# Patient Record
Sex: Male | Born: 1935 | Race: White | Hispanic: No | Marital: Married | Smoking: Former smoker
Health system: Southern US, Community
[De-identification: ages and names within clinical notes are randomized; demographics above are authoritative.]

## PROBLEM LIST (undated history)

## (undated) DIAGNOSIS — E785 Hyperlipidemia, unspecified: Secondary | ICD-10-CM

## (undated) DIAGNOSIS — I10 Essential (primary) hypertension: Secondary | ICD-10-CM

## (undated) DIAGNOSIS — Z87442 Personal history of urinary calculi: Secondary | ICD-10-CM

## (undated) DIAGNOSIS — I519 Heart disease, unspecified: Secondary | ICD-10-CM

## (undated) DIAGNOSIS — F329 Major depressive disorder, single episode, unspecified: Secondary | ICD-10-CM

## (undated) DIAGNOSIS — I2510 Atherosclerotic heart disease of native coronary artery without angina pectoris: Secondary | ICD-10-CM

## (undated) DIAGNOSIS — I714 Abdominal aortic aneurysm, without rupture, unspecified: Secondary | ICD-10-CM

## (undated) DIAGNOSIS — F32A Depression, unspecified: Secondary | ICD-10-CM

## (undated) DIAGNOSIS — I639 Cerebral infarction, unspecified: Secondary | ICD-10-CM

## (undated) DIAGNOSIS — I209 Angina pectoris, unspecified: Secondary | ICD-10-CM

## (undated) HISTORY — DX: Essential (primary) hypertension: I10

## (undated) HISTORY — PX: OTHER SURGICAL HISTORY: SHX169

## (undated) HISTORY — DX: Hyperlipidemia, unspecified: E78.5

## (undated) HISTORY — PX: EYE SURGERY: SHX253

## (undated) HISTORY — DX: Depression, unspecified: F32.A

## (undated) HISTORY — DX: Heart disease, unspecified: I51.9

## (undated) HISTORY — DX: Cerebral infarction, unspecified: I63.9

## (undated) HISTORY — PX: COLON SURGERY: SHX602

## (undated) HISTORY — DX: Major depressive disorder, single episode, unspecified: F32.9

## (undated) HISTORY — PX: COLONOSCOPY: SHX5424

## (undated) SURGERY — Surgical Case
Anesthesia: *Unknown

---

## 2001-02-13 ENCOUNTER — Encounter: Payer: Self-pay | Admitting: Family Medicine

## 2001-02-13 ENCOUNTER — Encounter: Admission: RE | Admit: 2001-02-13 | Discharge: 2001-02-13 | Payer: Self-pay | Admitting: Family Medicine

## 2001-02-27 ENCOUNTER — Ambulatory Visit (HOSPITAL_COMMUNITY): Admission: RE | Admit: 2001-02-27 | Discharge: 2001-02-27 | Payer: Self-pay | Admitting: Family Medicine

## 2001-03-01 ENCOUNTER — Encounter: Payer: Self-pay | Admitting: Vascular Surgery

## 2001-03-06 ENCOUNTER — Inpatient Hospital Stay (HOSPITAL_COMMUNITY): Admission: RE | Admit: 2001-03-06 | Discharge: 2001-03-07 | Payer: Self-pay | Admitting: Vascular Surgery

## 2001-09-18 ENCOUNTER — Ambulatory Visit (HOSPITAL_COMMUNITY): Admission: RE | Admit: 2001-09-18 | Discharge: 2001-09-18 | Payer: Self-pay | Admitting: Family Medicine

## 2004-12-03 ENCOUNTER — Ambulatory Visit (HOSPITAL_COMMUNITY): Admission: RE | Admit: 2004-12-03 | Discharge: 2004-12-03 | Payer: Self-pay | Admitting: Gastroenterology

## 2005-01-05 ENCOUNTER — Encounter: Admission: RE | Admit: 2005-01-05 | Discharge: 2005-01-05 | Payer: Self-pay | Admitting: General Surgery

## 2005-01-26 ENCOUNTER — Inpatient Hospital Stay (HOSPITAL_COMMUNITY): Admission: RE | Admit: 2005-01-26 | Discharge: 2005-01-30 | Payer: Self-pay | Admitting: General Surgery

## 2013-10-07 ENCOUNTER — Other Ambulatory Visit: Payer: Self-pay | Admitting: Family Medicine

## 2013-10-07 DIAGNOSIS — R109 Unspecified abdominal pain: Secondary | ICD-10-CM

## 2013-10-10 ENCOUNTER — Other Ambulatory Visit: Payer: Self-pay

## 2013-10-17 ENCOUNTER — Ambulatory Visit
Admission: RE | Admit: 2013-10-17 | Discharge: 2013-10-17 | Disposition: A | Discharge status: Home / Self Care | Payer: Medicare Other | Source: Ambulatory Visit | Attending: Family Medicine | Admitting: Family Medicine

## 2013-10-17 DIAGNOSIS — R109 Unspecified abdominal pain: Secondary | ICD-10-CM

## 2013-12-17 ENCOUNTER — Ambulatory Visit (INDEPENDENT_AMBULATORY_CARE_PROVIDER_SITE_OTHER): Payer: Medicare Other | Admitting: General Surgery

## 2013-12-17 ENCOUNTER — Encounter (INDEPENDENT_AMBULATORY_CARE_PROVIDER_SITE_OTHER): Payer: Self-pay | Admitting: General Surgery

## 2013-12-17 VITALS — BP 132/92 | HR 76 | Temp 97.2°F | Resp 18 | Ht 72.0 in | Wt 185.0 lb

## 2013-12-17 DIAGNOSIS — R229 Localized swelling, mass and lump, unspecified: Secondary | ICD-10-CM

## 2013-12-17 DIAGNOSIS — T8189XA Other complications of procedures, not elsewhere classified, initial encounter: Secondary | ICD-10-CM | POA: Insufficient documentation

## 2013-12-17 NOTE — Patient Instructions (Signed)
The tender nodule in the incision above your umbilicus is most likely some type of scar. This is possibly a suture granuloma.  You'll be scheduled for wound exploration and excision of this suture granuloma in the office sometime next month at your convenience.

## 2013-12-17 NOTE — Progress Notes (Signed)
Patient ID: Gregory [name], male   DOB: 1934-09-22, 79 y.o.   MRN: [id number]  Chief Complaint  Patient presents with  . Hernia    new pt    HPI Gregory [name] is a 79 y.o. male.  He is referred back to me by Dr. Dorena Cookey for evaluation of abdominal incisional wound problem.Dr. Benedetto Goad is his PCP.  All records have been destroyed. It appears that I did a right colectomy for precancerous villous adenoma in 2006. He has done well. Last colonoscopy 2012.  He has a six-month history of a firm nodule above the umbilicus at the medial end of a transverse incision. This sometimes becomes a little bit red and painful and sometimes this subsided there is always a hard nodule there. It has not drained. He does not have a hernia.  Comorbidities include hypertension, history of CVA 2001, colon polyps, small aneurysm of the abdominal aorta, 3.3 cm, anxiety and depression, hyperlipidemia, carotid artery repair.  Significant medications include aspirin, hydrochlorothiazide, Norvasc, fish oil, statin drugs.  HPI  Past Medical History  Diagnosis Date  . Stroke 2004?    TIA  . Hyperlipidemia   . Hypertension     Past Surgical History  Procedure Laterality Date  . Colon surgery      Family History  Problem Relation Age of Onset  . Cancer Mother     breast  . Heart disease Mother   . Heart disease Brother     Social History History  Substance Use Topics  . Smoking status: Former Games developer  . Smokeless tobacco: Not on file  . Alcohol Use: No    No Known Allergies  Current Outpatient Prescriptions  Medication Sig Dispense Refill  . amLODipine (NORVASC) 5 MG tablet Take 5 mg by mouth daily.      Marland Kitchen aspirin 81 MG tablet Take 81 mg by mouth daily.      . DULoxetine (CYMBALTA) 30 MG capsule Take 30 mg by mouth daily.      . hydrochlorothiazide (MICROZIDE) 12.5 MG capsule Take 12.5 mg by mouth daily.      . Omega-3 Fatty Acids (OMEGA 3 PO) Take by mouth.      . ramipril (ALTACE) 10  MG capsule Take 10 mg by mouth daily.      . simvastatin (ZOCOR) 40 MG tablet Take 40 mg by mouth daily.       No current facility-administered medications for this visit.    Review of Systems Review of Systems  Constitutional: Negative for fever, chills and unexpected weight change.  HENT: Negative for congestion, hearing loss, sore throat, trouble swallowing and voice change.   Eyes: Negative for visual disturbance.  Respiratory: Negative for cough and wheezing.   Cardiovascular: Negative for chest pain, palpitations and leg swelling.  Gastrointestinal: Positive for abdominal pain. Negative for nausea, vomiting, diarrhea, constipation, blood in stool, abdominal distention, anal bleeding and rectal pain.  Genitourinary: Negative for hematuria and difficulty urinating.  Musculoskeletal: Negative for arthralgias.  Skin: Negative for rash and wound.  Neurological: Negative for seizures, syncope, weakness and headaches.  Hematological: Negative for adenopathy. Does not bruise/bleed easily.  Psychiatric/Behavioral: Negative for confusion.    Blood pressure 132/92, pulse 76, temperature 97.2 F (36.2 C), temperature source Temporal, resp. rate 18, height 6' (1.829 m), weight 185 lb (83.915 kg).  Physical Exam Physical Exam  Constitutional: He is oriented to person, place, and time. He appears well-developed and well-nourished. No distress.  HENT:  Head: Normocephalic.  Nose: Nose normal.  Mouth/Throat: No oropharyngeal exudate.  Eyes: Conjunctivae and EOM are normal. Pupils are equal, round, and reactive to light. Right eye exhibits no discharge. Left eye exhibits no discharge. No scleral icterus.  Neck: Normal range of motion. Neck supple. No JVD present. No tracheal deviation present. No thyromegaly present.  Cardiovascular: Normal rate, regular rhythm, normal heart sounds and intact distal pulses.   No murmur heard. Pulmonary/Chest: Effort normal and breath sounds normal. No  stridor. No respiratory distress. He has no wheezes. He has no rales. He exhibits no tenderness.  Abdominal: Soft. Bowel sounds are normal. He exhibits no distension and no mass. There is no tenderness. There is no rebound and no guarding.  There is a transverse, right-sided abdominal incision that goes just above the umbilicus and extends for about 15 cm to the right. There is no hernia. At the most medial end of this incision is about a 3 mm firm, minimally tender, rough nodule that is raised up off the surface 3 or 4 mm. Possible variant of suture granuloma, but no infection or drainage.  Musculoskeletal: Normal range of motion. He exhibits no edema and no tenderness.  Lymphadenopathy:    He has no cervical adenopathy.  Neurological: He is alert and oriented to person, place, and time. He has normal reflexes. Coordination normal.  Skin: Skin is warm and dry. No rash noted. He is not diaphoretic. No erythema. No pallor.  Psychiatric: He has a normal mood and affect. His behavior is normal. Judgment and thought content normal.    Data Reviewed Dr. Dorena Cookey office notes.  Assessment    Abdominal incisional mass, medial aspect of right transverse incision. 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 Due to persistence of symptoms for 6 months, feel that this will need to be explored and/or excised for resolution  History CVA 2001  History right colectomy 2006 for villous adenoma  Hypertension  Hyperlipidemia  Anxiety and depression  History carotid artery repair     Plan    Scheduled for office procedure for excision of suture granuloma of abdominal incision. Local anesthesia.  I explained my approach to this. We will probably be left open to heal by secondary intention. Techniques, and risks were outlined in detail. He understands all this and agrees with this plan.        Rosell Khouri M 12/17/2013, 4:46 PM

## 2014-01-01 ENCOUNTER — Other Ambulatory Visit (INDEPENDENT_AMBULATORY_CARE_PROVIDER_SITE_OTHER): Payer: Self-pay | Admitting: General Surgery

## 2014-01-01 ENCOUNTER — Ambulatory Visit (INDEPENDENT_AMBULATORY_CARE_PROVIDER_SITE_OTHER): Payer: Medicare Other | Admitting: General Surgery

## 2014-01-01 ENCOUNTER — Encounter (INDEPENDENT_AMBULATORY_CARE_PROVIDER_SITE_OTHER): Payer: Self-pay | Admitting: General Surgery

## 2014-01-01 VITALS — BP 132/78 | HR 72 | Temp 97.8°F | Resp 14 | Ht 72.0 in | Wt 183.4 lb

## 2014-01-01 DIAGNOSIS — T8189XA Other complications of procedures, not elsewhere classified, initial encounter: Secondary | ICD-10-CM

## 2014-01-01 DIAGNOSIS — M795 Residual foreign body in soft tissue: Secondary | ICD-10-CM

## 2014-01-01 NOTE — Patient Instructions (Signed)
We were able to excise the chronically inflamed skin and also,  when we got deeper in the wound we were able to remove a large suture with numerous knots in it. This is probably the cause of the suture granuloma.  Take a shower once or twice a day with the bandage off.  After the shower, cover the wound with a dry gauze bandage.  Did not put any ointment or cream  on the wound.  This should heal completely in 2 or 3 weeks.  Return to see Dr. Derrell LollingIngram if this does not completely heal

## 2014-01-01 NOTE — Progress Notes (Signed)
Patient ID: Gregory [name], male   DOB: 02/17/1934, 80 y.o.   MRN: [id number]  This patient returns for wound exploration and presumed suture granuloma excision.  Procedure note: The skin was prepped and draped in sterile fashion. 1% Xylocaine with epinephrine local. At the medial end of the transverse of our incision, I performed a transverse elliptical incision and excised the chronically inflamed skin and sent that to pathology. I didn't explore the wound more deeply and found a large monofilament suture with numerous knots in it and this was completely excised. I showed this to the patient. There was no infection or purulence. Minimal bleeding. Dry bandage. Tolerated well.  Assessment: Suture granuloma, medial end of transverse abdominal  incision, excised today  Plan: Wound care discussed. Check pathology Return to see me if this does not heal completely in 3 weeks.   Angelia MouldHaywood M. Derrell LollingIngram, M.D., [facility] General and Minimally invasive Surgery Breast and Colorectal Surgery Office:   [phone] Pager:   [phone]

## 2014-01-07 NOTE — Progress Notes (Signed)
Quick Note:  Inform patient of Pathology report,. Consistent with suture granuloma. No cancer.  hmi ______

## 2014-01-08 ENCOUNTER — Telehealth (INDEPENDENT_AMBULATORY_CARE_PROVIDER_SITE_OTHER): Payer: Self-pay

## 2014-01-08 NOTE — Telephone Encounter (Signed)
Message copied by Ivory BroadGLASPEY, Xochilt Conant on Wed Jan 08, 2014 10:42 AM ------      Message from: Ernestene MentionINGRAM, HAYWOOD M      Created: Tue Jan 07, 2014  5:54 PM       Inform patient of Pathology report,. Consistent with suture granuloma. No cancer.            hmi ------

## 2014-01-08 NOTE — Telephone Encounter (Signed)
Called pt.  He is doing well.  He just has a Band Aid over the incision.  I gave him the pathology results.

## 2014-01-22 ENCOUNTER — Encounter (INDEPENDENT_AMBULATORY_CARE_PROVIDER_SITE_OTHER): Payer: Self-pay

## 2014-03-03 ENCOUNTER — Other Ambulatory Visit: Payer: Self-pay | Admitting: Dermatology

## 2016-03-10 DIAGNOSIS — I739 Peripheral vascular disease, unspecified: Secondary | ICD-10-CM | POA: Diagnosis present

## 2016-03-10 DIAGNOSIS — I491 Atrial premature depolarization: Secondary | ICD-10-CM | POA: Insufficient documentation

## 2016-08-24 ENCOUNTER — Other Ambulatory Visit: Payer: Self-pay | Admitting: Family Medicine

## 2016-08-24 DIAGNOSIS — Z136 Encounter for screening for cardiovascular disorders: Secondary | ICD-10-CM

## 2016-08-24 DIAGNOSIS — Z8679 Personal history of other diseases of the circulatory system: Secondary | ICD-10-CM

## 2016-08-29 ENCOUNTER — Other Ambulatory Visit: Payer: Medicare Other

## 2016-08-30 ENCOUNTER — Other Ambulatory Visit: Payer: Medicare Other

## 2016-09-07 ENCOUNTER — Ambulatory Visit
Admission: RE | Admit: 2016-09-07 | Discharge: 2016-09-07 | Disposition: A | Discharge status: Home / Self Care | Payer: Medicare Other | Source: Ambulatory Visit | Attending: Family Medicine | Admitting: Family Medicine

## 2016-09-07 DIAGNOSIS — Z8679 Personal history of other diseases of the circulatory system: Secondary | ICD-10-CM

## 2017-06-14 ENCOUNTER — Encounter (INDEPENDENT_AMBULATORY_CARE_PROVIDER_SITE_OTHER): Payer: Self-pay

## 2017-06-14 ENCOUNTER — Ambulatory Visit (INDEPENDENT_AMBULATORY_CARE_PROVIDER_SITE_OTHER): Payer: Medicare Other | Admitting: Neurology

## 2017-06-14 ENCOUNTER — Encounter: Payer: Self-pay | Admitting: Neurology

## 2017-06-14 VITALS — BP 146/83 | HR 57 | Ht 72.0 in | Wt 188.4 lb

## 2017-06-14 DIAGNOSIS — E538 Deficiency of other specified B group vitamins: Secondary | ICD-10-CM | POA: Diagnosis not present

## 2017-06-14 DIAGNOSIS — R404 Transient alteration of awareness: Secondary | ICD-10-CM

## 2017-06-14 DIAGNOSIS — R480 Dyslexia and alexia: Secondary | ICD-10-CM

## 2017-06-14 DIAGNOSIS — R251 Tremor, unspecified: Secondary | ICD-10-CM | POA: Diagnosis not present

## 2017-06-14 DIAGNOSIS — R4189 Other symptoms and signs involving cognitive functions and awareness: Secondary | ICD-10-CM

## 2017-06-14 NOTE — Progress Notes (Addendum)
[facility]    Provider:  Dr Lucia GaskinsAhern Referring Provider: Barbie BannerWilson, Fred H, MD Primary Care Physician:  Barbie BannerWilson, Fred H, MD  CC:  Tremor  HPI:  Gregory [name] is a 83 y.o. male here as a referral from Dr. Andrey CampanileWilson for tremor. PMHx AAA, impaired fasting glucose, stroke, HTN, HLD, depression. Patient is here with his son who provides much information. Tremors started 2 months ago. Patient is a very poor historian. Tremor started with medication for depression. Tremors when using his hands, with motion, buttoning shirts. No FHx of tremors. No resting tremor. He has 11 siblings without tremor. No resting tremor. After Latuda he was placed on Zoloft which did not help the tremor. He does not consistently take the propranolol, when he takes the propranolol it helps. Tremor has been stable, its just there.  He is also on Rexalti (dopamine blocker). Tremor not worsening. He drinks a lot of caffeinated tea, 3-4 glasses at a time. But at home he drinks mostly water. He goes out 3-4x a week and drinks.   Reviewed notes, labs and imaging from outside physicians, which showed:  Hemoglobin A1c 6.2 03/16/2017. CMP 03/16/2017 with sodium 1:30, chloride 94, lupus 139, ALT 58 otherwise normal with BUN 12 and creatinine 1.07. CBC 03/16/2017 with mild anemia hemoglobin 13.9, MCV 94 otherwise normal.  Review of Systems: Patient complains of symptoms per HPI as well as the following symptoms: depression, tremor. Pertinent negatives and positives per HPI. All others negative.   Social History   Social History  . Marital status: Married    Spouse name: N/A  . Number of children: 2  . Years of education: N/A   Occupational History  . Not on file.   Social History Main Topics  . Smoking status: Former Games developermoker  . Smokeless tobacco: Never Used  . Alcohol use No  . Drug use: No  . Sexual activity: Not on file   Other Topics Concern  . Not on file   Social History Narrative   Lives at home  w/ his wife   Right-handed   Caffeine: occasional coffee and tea, decaf tea at home    Family History  Problem Relation Age of Onset  . Cancer Mother        breast  . Heart disease Mother   . Heart disease Brother   . Tremor Neg Hx     Past Medical History:  Diagnosis Date  . Depression   . Hyperlipidemia   . Hypertension   . Stroke [facility] 2004?   TIA    Past Surgical History:  Procedure Laterality Date  . COLON SURGERY    . OTHER SURGICAL HISTORY     Carotid     Current Outpatient Prescriptions  Medication Sig Dispense Refill  . ALPRAZolam (XANAX) 0.5 MG tablet TAKE 1 TABLET DURING THE DAY, AND 2 TABLETS AT BEDTIME PRN FOR ANXIETY AND INSOMNIA.    Marland Kitchen. amLODipine (NORVASC) 5 MG tablet Take 5 mg by mouth daily.    Marland Kitchen. atorvastatin (LIPITOR) 40 MG tablet TAKE 1 TABLET BY MOUTH EVERY DAY TO LOWER CHOLESTEROL.    . Brexpiprazole (REXULTI) 0.5 MG TABS Take 1.5 mg by mouth daily.    . Cholecalciferol (D3 VITAMIN PO) Take 2,000 Int'l Units by mouth daily.    Marland Kitchen. dipyridamole-aspirin (AGGRENOX) 200-25 MG 12hr capsule TAKE 1 CAPSULE BY MOUTH TWICE DAILY FOR STROKE PREVENTION.    . flecainide (TAMBOCOR) 50 MG tablet Take 50 mg by mouth 2 (two) times  daily.     . Glucosamine HCl (GLUCOSAMINE PO) Take 1,500 mg by mouth daily.    . hydrochlorothiazide (MICROZIDE) 12.5 MG capsule Take 12.5 mg by mouth daily.    . mirtazapine (REMERON) 7.5 MG tablet Take 7.5 mg by mouth at bedtime.     . Multiple Vitamins-Minerals (CENTRUM ADULTS PO) Take by mouth daily.    . Omega-3 Fatty Acids (FISH OIL) 1200 MG CAPS Take by mouth daily.    . Omega-3 Fatty Acids (OMEGA 3 PO) Take 500 mg by mouth daily.     . propranolol (INDERAL) 10 MG tablet TK 1-2 TS PO BID PRF TREMORS  1  . ramipril (ALTACE) 10 MG capsule Take 10 mg by mouth daily.    . sertraline (ZOLOFT) 50 MG tablet TK 3 TS PO D  1  . aspirin 81 MG tablet Take 81 mg by mouth daily.     No current facility-administered medications for this visit.       Allergies as of 06/14/2017 - Review Complete 06/14/2017  Allergen Reaction Noted  . Bupropion Other (See Comments) 03/02/2016  . Valproic acid Other (See Comments) 03/02/2016    Vitals: BP (!) 146/83   Pulse (!) 57   Ht 6' (1.829 m)   Wt 188 lb 6.4 oz (85.5 kg)   BMI 25.55 kg/m  Last Weight:  Wt Readings from Last 1 Encounters:  06/14/17 188 lb 6.4 oz (85.5 kg)   Last Height:   Ht Readings from Last 1 Encounters:  06/14/17 6' (1.829 m)    Physical exam: Exam: Gen: NAD, conversant, well nourised, obese, well groomed                     CV: RRR, no MRG. No Carotid Bruits. No peripheral edema, warm, nontender Eyes: Conjunctivae clear without exudates or hemorrhage  Neuro: Detailed Neurologic Exam  Speech:    Speech is normal; fluent and spontaneous with normal comprehension.  Cognition:    The patient is oriented to person, place, and time;     recent and remote memory intact;     language fluent;     normal attention, concentration,     fund of knowledge Cranial Nerves:    The pupils are equal, round, and reactive to light. The fundi are normal and spontaneous venous pulsations are present. Visual fields are full to finger confrontation. Extraocular movements are intact. Trigeminal sensation is intact and the muscles of mastication are normal. The face is symmetric. The palate elevates in the midline. Hearing intact. Voice is normal. Shoulder shrug is normal. The tongue has normal motion without fasciculations.   Coordination:    Normal finger to nose and heel to shin. Normal rapid alternating movements.   Gait:    Heel-toe and tandem gait are normal.   Motor Observation:    No asymmetry, no atrophy. High-frequency low amplitude symmetric postural tremor with action component. Tone:    Normal muscle tone.    Posture:    Posture is normal. normal erect    Strength:    Strength is V/V in the upper and lower limbs.      Sensation: intact to LT     Reflex  Exam:  DTR's:    Deep tendon reflexes in the upper and lower extremities are normal bilaterally.   Toes:    The toes are downgoing bilaterally.   Clonus:    Clonus is absent.      Assessment/Plan:  83 year old patient with 2  months of tremor in the setting of medication management for his mood disorder. Differential diagnosis is physiologic due to medication, caffeine or anxiety but cannot rule out benign essential tremor. Had a long discussion with patient and his son, we'll check for other causes such as hypo-hypothyroidism. We'll check B12 due to short-term memory because B12 deficiency can cause neuropsychiatric symptoms as well. We'll request records from psychiatry for better understanding of his history of medications. No parkinsonian features.  06/22/2017: received psychiatry records, hand written notes difficult to interpret  Orders Placed This Encounter  Procedures  . Heavy metals, blood  . TSH  . B12 and Folate Panel  . Methylmalonic acid, serum  . Magnesium   Naomie Dean, MD  [facility] [address] [place], [place] [address]  Phone [phone] Fax [phone]

## 2017-06-14 NOTE — Patient Instructions (Signed)
Remember to drink plenty of fluid, eat healthy meals and do not skip any meals. Try to eat protein with a every meal and eat a healthy snack such as fruit or nuts in between meals. Try to keep a regular sleep-wake schedule and try to exercise daily, particularly in the form of walking, 20-30 minutes a day, if you can.   As far as diagnostic testing: Labs  I would like to see you back as needed, sooner if we need to. Please call us with any interim questions, concerns, problems, updates or refill requests.   Our phone number is [phone]. We also have an after hours call service for urgent matters and there is a physician on-call for urgent questions. For any emergencies you know to call 911 or go to the nearest emergency room  Tremor A tremor is trembling or shaking that you cannot control. Most tremors affect the hands or arms. Tremors can also affect the head, vocal cords, face, and other parts of the body. There are many types of tremors. Common types include:  Essential tremor. These usually occur in people over the age of 81. It may run in families and can happen in otherwise healthy people.  Resting tremor. These occur when the muscles are at rest, such as when your hands are resting in your lap. People with Parkinson disease often have resting tremors.  Postural tremor. These occur when you try to hold a pose, such as keeping your hands outstretched.  Kinetic tremor. These occur during purposeful movement, such as trying to touch a finger to your nose.  Task-specific tremor. These may occur when you perform tasks such as handwriting, speaking, or standing.  Psychogenic tremor. These dramatically lessen or disappear when you are distracted. They can happen in people of all ages.  Some types of tremors have no known cause. Tremors can also be a symptom of nervous system problems (neurological disorders) that may occur with aging. Some tremors go away with treatment while others do  not. Follow these instructions at home: Watch your tremor for any changes. The following actions may help to lessen any discomfort you are feeling:  Take medicines only as directed by your health care provider.  Limit alcohol intake to no more than 1 drink per day for nonpregnant women and 2 drinks per day for men. One drink equals 12 oz of beer, 5 oz of wine, or 1 oz of hard liquor.  Do not use any tobacco products, including cigarettes, chewing tobacco, or electronic cigarettes. If you need help quitting, ask your health care provider.  Avoid extreme heat or cold.  Limit the amount of caffeine you consumeas directed by your health care provider.  Try to get 8 hours of sleep each night.  Find ways to manage your stress, such as meditation or yoga.  Keep all follow-up visits as directed by your health care provider. This is important.  Contact a health care provider if:  You start having a tremor after starting a new medicine.  You have tremor with other symptoms such as: ? Numbness. ? Tingling. ? Pain. ? Weakness.  Your tremor gets worse.  Your tremor interferes with your day-to-day life. This information is not intended to replace advice given to you by your health care provider. Make sure you discuss any questions you have with your health care provider. Document Released: 11/25/2002 Document Revised: 08/07/2016 Document Reviewed: 06/02/2014 Elsevier Interactive Patient Education  [employer]2018 Elsevier Inc.

## 2017-06-15 ENCOUNTER — Telehealth: Payer: Self-pay

## 2017-06-15 NOTE — Telephone Encounter (Signed)
Received notes from [facility]. Dr. Jennelle Humanottle requested "evaluation for problematic tremor. 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 Has been under psych care for many years and has taken multiple psych meds including antidepressants, lithium, Depakote and antipsychotics. Current meds are sertraline, Rexulti, mirtazapine and propranolol... Tremor predates the current psych meds but has certainly been made worse by certain meds in the past such as lithium and Depakote. Please evaluate for any underlying organic causes for treatment resistant depression as well." Pt had neuro eval by Dr. Lucia GaskinsAhern yesterday. Notes placed in her Inbasket for review.

## 2017-06-16 LAB — B12 AND FOLATE PANEL
Folate: >20 ng/mL (ref >3.0)
Vitamin B-12: 977 pg/mL (ref 232–1245)

## 2017-06-16 LAB — HEAVY METALS, BLOOD
Arsenic: 8 ug/L (ref 2–23)
LEAD, BLOOD: 1 ug/dL (ref 0–19)
MERCURY: NOT DETECTED ug/L (ref 0.0–14.9)

## 2017-06-16 LAB — METHYLMALONIC ACID, SERUM: Methylmalonic Acid: 190 nmol/L (ref 0–378)

## 2017-06-16 LAB — MAGNESIUM: Magnesium: 2 mg/dL (ref 1.6–2.3)

## 2017-06-16 LAB — TSH: TSH: 1.84 u[IU]/mL (ref 0.450–4.500)

## 2017-06-20 ENCOUNTER — Telehealth: Payer: Self-pay | Admitting: Neurology

## 2017-06-20 NOTE — Telephone Encounter (Signed)
I called the wife, the blood work is normal. I sent the results to the primary care physician and to Dr. Jennelle Humanottle.

## 2017-06-20 NOTE — Telephone Encounter (Signed)
Patients son Casimiro NeedleMichael (not listed on DPR) called office with Junious DresserConnie (wife listed on DPR) in reference to receive lab results patient had done last week.  Wilmer FloorAdvised Connie RN will call herself or patient back with results due to son Casimiro NeedleMichael not being listed on DPR. Junious DresserConnie wife requesting if we can send a copy of lab results to patient psychiatrist Dr. Alanson Alyary Cottle.  Please call

## 2017-06-20 NOTE — Telephone Encounter (Signed)
Labs done 06/14/17 not yet reviewed/released by Dr. Lucia GaskinsAhern. She will return to the office on Thursday. Pt had magnesium, heavy metals, TSH, MMA, B12 and folate checked which were all w/i normal limits.

## 2017-06-23 ENCOUNTER — Telehealth: Payer: Self-pay

## 2017-06-23 NOTE — Telephone Encounter (Signed)
-----   Message from Anson FretAntonia B Ahern, MD sent at 06/22/2017 10:53 AM EDT ----- Labs normal

## 2017-07-25 ENCOUNTER — Telehealth: Payer: Self-pay | Admitting: Neurology

## 2017-07-25 NOTE — Telephone Encounter (Signed)
Gregory [name], happy to order and MRi thanks

## 2017-07-25 NOTE — Telephone Encounter (Signed)
Pt wife(on DPR) calling to inform that since pt was here on 6-27 his condition has worsen.  Hit motor skills are affected.  Pt wife stating the hospital suggested an MRI be ordered.  Pt wife would like to know if Dr Lucia GaskinsAhern would order one, please call

## 2017-07-25 NOTE — Telephone Encounter (Signed)
Pt w/ resistant bipolar depression and a hx of stroke was evaluated on 06/14/17 for development of problematic tremor which wife reports has worsened.

## 2017-07-25 NOTE — Addendum Note (Signed)
Addended by: Naomie DeanAHERN, Shantanu Strauch B on: 07/25/2017 03:41 PM   Modules accepted: Orders

## 2017-07-26 NOTE — Telephone Encounter (Signed)
Returned call to pt's wife to let her know that MRI has been ordered. F/u appt also scheduled per her request d/t worsening symptoms. Voiced appreciation for call.

## 2017-07-28 ENCOUNTER — Ambulatory Visit
Admission: RE | Admit: 2017-07-28 | Discharge: 2017-07-28 | Disposition: A | Discharge status: Home / Self Care | Payer: 59 | Source: Ambulatory Visit | Attending: Neurology | Admitting: Neurology

## 2017-07-28 DIAGNOSIS — R251 Tremor, unspecified: Secondary | ICD-10-CM

## 2017-07-28 DIAGNOSIS — R404 Transient alteration of awareness: Secondary | ICD-10-CM | POA: Diagnosis not present

## 2017-07-28 DIAGNOSIS — R4189 Other symptoms and signs involving cognitive functions and awareness: Secondary | ICD-10-CM | POA: Diagnosis not present

## 2017-07-28 DIAGNOSIS — R480 Dyslexia and alexia: Secondary | ICD-10-CM

## 2017-07-28 DIAGNOSIS — E538 Deficiency of other specified B group vitamins: Secondary | ICD-10-CM

## 2017-07-28 MED ORDER — GADOBENATE DIMEGLUMINE 529 MG/ML IV SOLN
17.0000 mL | Freq: Once | INTRAVENOUS | Status: AC | PRN
  Administered 2017-07-28: 17 mL via INTRAVENOUS

## 2017-07-31 NOTE — Telephone Encounter (Addendum)
Gregory [name], Antonia B, MD  Donnelly [name], [name], RN        There is evidence of old stroke on the right side of the brain. Patient does have a history of stroke and findings appear to be the same from MRI report from 2002. No new findings or new causes for his symptoms.    Returned call to pt's wife and reviewed stable MRI w/ no new stroke or other cause for symptoms. She reports that pt has been dealing w/ some depression since his 1st stroke but had always been able to "bounce back" from it. Now since about the 1st of the year, he has had severe issues w/ depression and has had multiple medication changes. Rexulti was d/c'd due to an increase in pt's tremor. He was changed to JordanLatuda but this seemed to make his depression worse. He continues on Zoloft 150 mg and recently started on a low dose of Lithium as well as Vraylar. Pt's wife notes decreased mobility and increase in tremor. She voiced concern about medication interactions due to changes to anti-depressants/anti-psychotics along w/ his anticoagulants and BP medications. Recommended that he see his PCP to be sure that he does not have another illness such as a UTI that may be contributing to his symptoms. She has already contacted his psychiatrist to discuss recent med changes. She also agreed to talk w/ pt's pharmacist about possible drug interactions and side effects. Pt may need an even lower dose of Lithium as he is taking HCTZ which can increase Lithium levels in the blood and contribute to worsening side effects such tremors, fatigue, muscle weakness and trouble walking.

## 2017-07-31 NOTE — Addendum Note (Signed)
Addended by: Donnelly [name],  L on: 07/31/2017 04:23 PM   Modules accepted: Orders

## 2017-07-31 NOTE — Telephone Encounter (Signed)
I reviewed patient's records and would not recommend any additional medications for tremor or parkinsonism concerns, particularly given age and already taking multiple medications currently. I would recommend what was already been discussed between patient and Dr. Lucia GaskinsAhern, which includes streamlining medication regimen from psychiatry standpoint, critical review of all medications by PCP, and staying well hydrated, well rested, limiting caffeine intake.

## 2017-07-31 NOTE — Telephone Encounter (Addendum)
Pt's wife called the clinic request MRI results. She is aware it can take 4 days to get results. She said his mobility is getting worse daily. Please call her at [phone] ([phone] and office # [phone]

## 2017-08-01 NOTE — Telephone Encounter (Addendum)
Called pt's wife back and reviewed w/ her that MRI was stable but the development of parkinsonian symptoms by patients initiating treatment with certain mood stabilizers is a common side effect. Incidence of tremor is most common during the beginning of therapy but may decrease after taking the medication for a while.  It can occur in patients taking therapeutic doses of lithium but can also be an early sign of toxic levels. Pt is also taking HCTZ which can also contribute to increased lithium levels as well. Wife agreed to discuss concerns of worsening tremor and decreased mobility, both w/ pt's PCP and psych provider, and will have drug levels checked if deemed necessary. She left messages at both offices yesterday and is awaiting call back. Encouraged her to continue good hydration and rest. States that pt does not drink alcohol and no longer drinks coffee. Agreed to continue propranolol as needed for tremor and to try vitamin B6 supplement as it has been reported to be effective in treatment of patients suffering from different kinds of neuroleptic-induced movement disorders including parkinsonism and tardive dyskinesia. MRI results and notes faxed to Dr. Andrey CampanileWilson (PCP) and Dr. Jennelle Humanottle (psych) as requested. Voiced appreciation for call.

## 2017-08-24 ENCOUNTER — Ambulatory Visit: Payer: Self-pay | Admitting: Neurology

## 2017-10-09 ENCOUNTER — Telehealth: Payer: Self-pay | Admitting: *Deleted

## 2017-10-09 ENCOUNTER — Ambulatory Visit: Payer: Medicare Other | Admitting: Neurology

## 2017-10-09 NOTE — Progress Notes (Deleted)
[facility]    Provider:  Dr Lucia Gaskins Referring Provider: Barbie Banner, MD Primary Care Physician:  Barbie Banner, MD  CC:    HPI:  Gregory [name] is a 83 y.o. male here as a referral from Dr. Andrey Campanile for ***.  ***  Reviewed notes, labs and imaging from outside physicians, which showed ***  Review of Systems: Patient complains of symptoms per HPI as well as the following symptoms ***. Pertinent negatives and positives per HPI. All others negative.   Social History   Social History  . Marital status: Married    Spouse name: N/A  . Number of children: 2  . Years of education: N/A   Occupational History  . Not on file.   Social History Main Topics  . Smoking status: Former Games developer  . Smokeless tobacco: Never Used  . Alcohol use No  . Drug use: No  . Sexual activity: Not on file   Other Topics Concern  . Not on file   Social History Narrative   Lives at home w/ his wife   Right-handed   Caffeine: occasional coffee and tea, decaf tea at home    Family History  Problem Relation Age of Onset  . Cancer Mother        breast  . Heart disease Mother   . Heart disease Brother   . Tremor Neg Hx     Past Medical History:  Diagnosis Date  . Depression   . Hyperlipidemia   . Hypertension   . Stroke [facility]) 2004?   TIA    Past Surgical History:  Procedure Laterality Date  . COLON SURGERY    . OTHER SURGICAL HISTORY     Carotid     Current Outpatient Prescriptions  Medication Sig Dispense Refill  . ALPRAZolam (XANAX) 0.5 MG tablet TAKE 1 TABLET DURING THE DAY, AND 2 TABLETS AT BEDTIME PRN FOR ANXIETY AND INSOMNIA.    Marland Kitchen amLODipine (NORVASC) 5 MG tablet Take 5 mg by mouth daily.    Marland Kitchen aspirin 81 MG tablet Take 81 mg by mouth daily.    Marland Kitchen atorvastatin (LIPITOR) 40 MG tablet TAKE 1 TABLET BY MOUTH EVERY DAY TO LOWER CHOLESTEROL.    . Cariprazine HCl (VRAYLAR) 1.5 MG CAPS Take 1.5 mg by mouth.    . Cholecalciferol (D3 VITAMIN PO) Take 2,000 Int'l  Units by mouth daily.    Marland Kitchen dipyridamole-aspirin (AGGRENOX) 200-25 MG 12hr capsule TAKE 1 CAPSULE BY MOUTH TWICE DAILY FOR STROKE PREVENTION.    . flecainide (TAMBOCOR) 50 MG tablet Take 50 mg by mouth 2 (two) times daily.     . Glucosamine HCl (GLUCOSAMINE PO) Take 1,500 mg by mouth daily.    . hydrochlorothiazide (MICROZIDE) 12.5 MG capsule Take 12.5 mg by mouth daily.    Marland Kitchen lithium carbonate 150 MG capsule TK ONE C PO QPM  1  . mirtazapine (REMERON) 7.5 MG tablet Take 7.5 mg by mouth at bedtime.     . Multiple Vitamins-Minerals (CENTRUM ADULTS PO) Take by mouth daily.    . Omega-3 Fatty Acids (FISH OIL) 1200 MG CAPS Take by mouth daily.    . Omega-3 Fatty Acids (OMEGA 3 PO) Take 500 mg by mouth daily.     . propranolol (INDERAL) 10 MG tablet TK 1-2 TS PO BID PRF TREMORS  1  . ramipril (ALTACE) 10 MG capsule Take 10 mg by mouth daily.    . sertraline (ZOLOFT) 50 MG tablet TK 3 TS PO D  1   No current facility-administered medications for this visit.     Allergies as of 10/09/2017 - Review Complete 06/14/2017  Allergen Reaction Noted  . Bupropion Other (See Comments) 03/02/2016  . Valproic acid Other (See Comments) 03/02/2016    Vitals: There were no vitals taken for this visit. Last Weight:  Wt Readings from Last 1 Encounters:  06/14/17 188 lb 6.4 oz (85.5 kg)   Last Height:   Ht Readings from Last 1 Encounters:  06/14/17 6' (1.829 m)         Assessment/Plan:    @ordernmenc @  Naomie DeanAntonia Vestal Markin, MD  [facility] [address] [place], [place] [address]  Phone [phone] Fax [phone]  A total of *** minutes was spent face-to-face with this patient. Over half this time was spent on counseling patient on the *** diagnosis and different diagnostic and therapeutic options available.   greater than 50% of the visit was spent in counseling and coordination of care  Even stable problems: CHF:  Remains stable today  .Diagnostic results,  impressions, or recommended diagnostic studies, .Prognosis, .Risks and benefits of management (treatment) options, .Instructions for management (treatment) or follow-up, .Importance of compliance with chosen management (treatment) options, .Risk factor reduction, .Patient and family education

## 2017-10-09 NOTE — Telephone Encounter (Signed)
Patient no show 10/09/17 2:00 PM appt--Out of town.

## 2017-10-10 ENCOUNTER — Encounter: Payer: Self-pay | Admitting: Neurology

## 2017-11-14 ENCOUNTER — Ambulatory Visit: Payer: Medicare Other | Admitting: Neurology

## 2018-01-02 ENCOUNTER — Ambulatory Visit (INDEPENDENT_AMBULATORY_CARE_PROVIDER_SITE_OTHER): Payer: Medicare Other | Admitting: Neurology

## 2018-01-02 ENCOUNTER — Encounter (INDEPENDENT_AMBULATORY_CARE_PROVIDER_SITE_OTHER): Payer: Self-pay

## 2018-01-02 ENCOUNTER — Encounter: Payer: Self-pay | Admitting: Neurology

## 2018-01-02 VITALS — BP 138/72 | HR 62 | Ht 72.0 in | Wt 180.0 lb

## 2018-01-02 DIAGNOSIS — I6523 Occlusion and stenosis of bilateral carotid arteries: Secondary | ICD-10-CM | POA: Diagnosis not present

## 2018-01-02 DIAGNOSIS — Z9889 Other specified postprocedural states: Secondary | ICD-10-CM

## 2018-01-02 DIAGNOSIS — G5601 Carpal tunnel syndrome, right upper limb: Secondary | ICD-10-CM | POA: Diagnosis not present

## 2018-01-02 DIAGNOSIS — I6930 Unspecified sequelae of cerebral infarction: Secondary | ICD-10-CM

## 2018-01-02 DIAGNOSIS — I63511 Cerebral infarction due to unspecified occlusion or stenosis of right middle cerebral artery: Secondary | ICD-10-CM

## 2018-01-02 DIAGNOSIS — Z8673 Personal history of transient ischemic attack (TIA), and cerebral infarction without residual deficits: Secondary | ICD-10-CM | POA: Insufficient documentation

## 2018-01-02 NOTE — Progress Notes (Addendum)
[id number]    Provider:  Dr Lucia Gaskins Referring Provider: Barbie Banner, MD Primary Care Physician:  Barbie Banner, MD  CC:  Tremor  Interval history 01/02/2018: Tremor is improved. He had another MRI since seeing me. 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 Reviewed images with patient and wife. He has numbness in the right hand. Been going on only a few m onths.    HPI:  Gregory [name] is a 84 y.o. male here as a referral from Dr. Andrey Campanile for tremor. PMHx AAA, impaired fasting glucose, stroke, HTN, HLD, depression. Patient is here with his son who provides much information. Tremors started 2 months ago. Patient is a very poor historian. Tremor started with medication for depression. Tremors when using his hands, with motion, buttoning shirts. No FHx of tremors. No resting tremor. He has 11 siblings without tremor. No resting tremor. After Latuda he was placed on Zoloft which did not help the tremor. He does not consistently take the propranolol, when he takes the propranolol it helps. Tremor has been stable, its just there.  He is also on Rexalti (dopamine blocker). Tremor not worsening. He drinks a lot of caffeinated tea, 3-4 glasses at a time. But at home he drinks mostly water. He goes out 3-4x a week and drinks.   Reviewed notes, labs and imaging from outside physicians, which showed:  Hemoglobin A1c 6.2 03/16/2017. CMP 03/16/2017 with sodium 1:30, chloride 94, lupus 139, ALT 58 otherwise normal with BUN 12 and creatinine 1.07. CBC 03/16/2017 with mild anemia hemoglobin 13.9, MCV 94 otherwise normal.  Review of Systems: Patient complains of symptoms per HPI as well as the following symptoms: depression, tremor. Pertinent negatives and positives per HPI. All others negative.   Social History    Socioeconomic History  . Marital status: Married    Spouse name: Not on file  . Number of children: 2  . Years of education: Not on file  . Highest education level: Some college, no degree  Social Needs  . Financial resource strain: Not on file  . Food insecurity - worry: Not on file  . Food insecurity - inability: Not on file  . Transportation needs - medical: Not on file  . Transportation needs - non-medical: Not on file  Occupational History  . Not on file  Tobacco Use  . Smoking status: Former Games developer  . Smokeless tobacco: Never Used  Substance and Sexual Activity  . Alcohol use: No  . Drug use: No  . Sexual activity: Not on file  Other Topics Concern  . Not on file  Social History Narrative   Lives at home w/ his wife   Right-handed   Caffeine: occasional coffee and tea, decaf tea at home    Family History  Problem Relation Age of Onset  . Cancer Mother        breast  . Heart disease Mother   . Heart disease Brother   . Tremor Neg Hx     Past Medical History:  Diagnosis Date  . Depression   . Hyperlipidemia   . Hypertension   . Stroke [facility]) 2004?   TIA    Past Surgical History:  Procedure Laterality Date  . COLON SURGERY    . OTHER SURGICAL HISTORY     Carotid  Current Outpatient Medications  Medication Sig Dispense Refill  . ALPRAZolam (XANAX) 0.5 MG tablet TAKE 1 TABLET DURING THE DAY, AND 2 TABLETS AT BEDTIME PRN FOR ANXIETY AND INSOMNIA.    Marland Kitchen amLODipine (NORVASC) 5 MG tablet Take 5 mg by mouth daily.    Marland Kitchen aspirin 81 MG tablet Take 81 mg by mouth daily.    Marland Kitchen atorvastatin (LIPITOR) 40 MG tablet TAKE 1 TABLET BY MOUTH EVERY DAY TO LOWER CHOLESTEROL.    . Cholecalciferol (D3 VITAMIN PO) Take 2,000 Int'l Units by mouth daily.    Marland Kitchen dipyridamole-aspirin (AGGRENOX) 200-25 MG 12hr capsule TAKE 1 CAPSULE BY MOUTH TWICE DAILY FOR STROKE PREVENTION.    . flecainide (TAMBOCOR) 50 MG tablet Take 50 mg by mouth 2 (two) times daily.     . Glucosamine HCl  (GLUCOSAMINE PO) Take 1,500 mg by mouth daily.    Marland Kitchen lithium carbonate 150 MG capsule TK ONE C PO QPM  1  . mirtazapine (REMERON) 7.5 MG tablet Take 7.5 mg by mouth at bedtime.     . Multiple Vitamins-Minerals (CENTRUM ADULTS PO) Take by mouth daily.    . Omega-3 Fatty Acids (FISH OIL) 1200 MG CAPS Take by mouth daily.    . Omega-3 Fatty Acids (OMEGA 3 PO) Take 500 mg by mouth daily.     . propranolol (INDERAL) 10 MG tablet TK 1-2 TS PO BID PRF TREMORS  1  . ramipril (ALTACE) 10 MG capsule Take 10 mg by mouth daily.    . sertraline (ZOLOFT) 50 MG tablet TK 3 TS PO D  1   No current facility-administered medications for this visit.     Allergies as of 01/02/2018 - Review Complete 01/02/2018  Allergen Reaction Noted  . Bupropion Other (See Comments) 03/02/2016  . Valproic acid Other (See Comments) 03/02/2016    Vitals: Ht 6' (1.829 m)   Wt 180 lb (81.6 kg)   BMI 24.41 kg/m  Last Weight:  Wt Readings from Last 1 Encounters:  01/02/18 180 lb (81.6 kg)   Last Height:   Ht Readings from Last 1 Encounters:  01/02/18 6' (1.829 m)    Physical exam: Exam: Gen: NAD, conversant, well nourised, obese, well groomed                     CV: RRR, no MRG. No Carotid Bruits. No peripheral edema, warm, nontender Eyes: Conjunctivae clear without exudates or hemorrhage  Neuro: Detailed Neurologic Exam  Speech:    Speech is normal; fluent and spontaneous with normal comprehension.  Cognition:    The patient is oriented to person, place, and time;     recent and remote memory intact;     language fluent;     normal attention, concentration,     fund of knowledge Cranial Nerves:    The pupils are equal, round, and reactive to light. The fundi are normal and spontaneous venous pulsations are present. Visual fields are full to finger confrontation. Extraocular movements are intact. Trigeminal sensation is intact and the muscles of mastication are normal. The face is symmetric. The palate  elevates in the midline. Hearing intact. Voice is normal. Shoulder shrug is normal. The tongue has normal motion without fasciculations.   Coordination:    Normal finger to nose and heel to shin. Normal rapid alternating movements.   Gait:    Heel-toe and tandem gait are normal.   Motor Observation:    No asymmetry, no atrophy. High-frequency low amplitude symmetric postural tremor  with action component, minimal Tone:    Normal muscle tone.    Posture:    Posture is normal. normal erect    Strength:    Strength is V/V in the upper and lower limbs.      Sensation: intact to LT     Reflex Exam:  DTR's:    Deep tendon reflexes in the upper and lower extremities are normal bilaterally.   Toes:    The toes are downgoing bilaterally.   Clonus:    Clonus is absent.      Assessment/Plan:  84 year old patient with 2 months of tremor in the setting of medication management for his mood disorder.  Improved with changes in medication.   His MRI 07/2017 showed right MCA stroke, they would like imaging of the blood vessels which I think is reasonable considering he has a hx of large stroke and carotid stenosis that may benefit from a procedure. Need to look for causes of arterial emboli.   New problem: right hand numbness, likely CTS. Wear a wrist brace at night. Call back for EMG/NCS if worsens  Addendum 01/26/2018: 80% diameter stenosis left internal carotid artery at the origin due to calcific plaque. Also widespread atherosclerosis also aorta. Referral to vascular.  Orders Placed This Encounter  Procedures  . CT ANGIO NECK W OR WO CONTRAST  . CT ANGIO HEAD W OR WO CONTRAST  . Basic Metabolic Panel   Naomie DeanAntonia Kervin Bones, MD  [facility] [address] [place], [place] [address]  Phone [phone] Fax [phone]  A total of 25 minutes was spent in with this patient. Over half this time was spent on counseling patient on the stroke diagnosis and  different therapeutic options available.

## 2018-01-02 NOTE — Patient Instructions (Addendum)
CT Angio of the head and neck Lab today Wear a wrist brace at night. Call back for EMG/NCS if worsens    Carpal Tunnel Syndrome Carpal tunnel syndrome is a condition that causes pain in your hand and arm. The carpal tunnel is a narrow area that is on the palm side of your wrist. Repeated wrist motion or certain diseases may cause swelling in the tunnel. This swelling can pinch the main nerve in the wrist (median nerve). Follow these instructions at home: If you have a splint:  Wear it as told by your doctor. Remove it only as told by your doctor.  Loosen the splint if your fingers: ? Become numb and tingle. ? Turn blue and cold.  Keep the splint clean and dry. General instructions  Take over-the-counter and prescription medicines only as told by your doctor.  Rest your wrist from any activity that may be causing your pain. If needed, talk to your employer about changes that can be made in your work, such as getting a wrist pad to use while typing.  If directed, apply ice to the painful area: ? Put ice in a plastic bag. ? Place a towel between your skin and the bag. ? Leave the ice on for 20 minutes, 2-3 times per day.  Keep all follow-up visits as told by your doctor. This is important.  Do any exercises as told by your doctor, physical therapist, or occupational therapist. Contact a doctor if:  You have new symptoms.  Medicine does not help your pain.  Your symptoms get worse. This information is not intended to replace advice given to you by your health care provider. Make sure you discuss any questions you have with your health care provider. Document Released: 11/24/2011 Document Revised: 05/12/2016 Document Reviewed: 04/22/2015 Elsevier Interactive Patient Education  [employer]2018 Elsevier Inc.

## 2018-01-03 ENCOUNTER — Telehealth: Payer: Self-pay | Admitting: *Deleted

## 2018-01-03 LAB — BASIC METABOLIC PANEL
BUN / CREAT RATIO: 10 (ref 10–24)
BUN: 11 mg/dL (ref 8–27)
CO2: 24 mmol/L (ref 20–29)
Calcium: 9.8 mg/dL (ref 8.6–10.2)
Chloride: 103 mmol/L (ref 96–106)
Creatinine, Ser: 1.13 mg/dL (ref 0.76–1.27)
GFR calc Af Amer: 70 mL/min/{1.73_m2} (ref >59)
GFR, EST NON AFRICAN AMERICAN: 61 mL/min/{1.73_m2} (ref >59)
Glucose: 154 mg/dL — ABNORMAL HIGH (ref 65–99)
Potassium: 4.7 mmol/L (ref 3.5–5.2)
SODIUM: 142 mmol/L (ref 134–144)

## 2018-01-03 NOTE — Telephone Encounter (Signed)
Spoke with pt's wife Junious DresserConnie (on [place]). She is aware that his labs are unremarkable and that his glucose was elevated. She thought he had possibly eaten not long before the appt but this is not the first time it has been elevated so they will keep a check on it. She verbalized appreciation.

## 2018-01-03 NOTE — Telephone Encounter (Signed)
-----   Message from Anson FretAntonia B Ahern, MD sent at 01/03/2018  7:48 AM EST ----- Unremarkable, glucose elevated but he may have recently eaten thanks

## 2018-01-15 ENCOUNTER — Other Ambulatory Visit: Payer: Medicare Other

## 2018-01-24 ENCOUNTER — Ambulatory Visit
Admission: RE | Admit: 2018-01-24 | Discharge: 2018-01-24 | Disposition: A | Discharge status: Home / Self Care | Payer: Medicare Other | Source: Ambulatory Visit | Attending: Neurology | Admitting: Neurology

## 2018-01-24 DIAGNOSIS — Z9889 Other specified postprocedural states: Secondary | ICD-10-CM

## 2018-01-24 DIAGNOSIS — I63511 Cerebral infarction due to unspecified occlusion or stenosis of right middle cerebral artery: Secondary | ICD-10-CM

## 2018-01-24 DIAGNOSIS — I6523 Occlusion and stenosis of bilateral carotid arteries: Secondary | ICD-10-CM

## 2018-01-24 MED ORDER — IOPAMIDOL (ISOVUE-370) INJECTION 76%
75.0000 mL | Freq: Once | INTRAVENOUS | Status: AC | PRN
  Administered 2018-01-24: 75 mL via INTRAVENOUS

## 2018-01-25 ENCOUNTER — Telehealth: Payer: Self-pay | Admitting: Neurology

## 2018-01-25 NOTE — Telephone Encounter (Signed)
-----   Message from Anson FretAntonia B Ahern, MD sent at 01/25/2018  9:52 AM EST ----- Gregory CopierBethany, imaging shows 80% diameter stenosis left internal carotid artery at the origin due to calcific plaque. He probably needs to see a vascular surgeon to see if he needs another carotid endarderectomy like he had completed on the right. We can refer him tovascular surgery or if he wants he can go back to the surgeon who did his last surgery. Also he needs to follow up with his primary care and bring this report, he has a lot of atherosclerosis in his blood vessels near the heart. thanks

## 2018-01-25 NOTE — Telephone Encounter (Signed)
Called the patient and spoke with the wife on the results. I was able to tell her what all it showed and I informed her that I would send a copy to the PCP. She was appreciative.the patients wife would like to order the referral to a vascular surgeon since the one he has done before was years ago. She is fine with staying within the [facility]. I have also mailed copies of the reports to the patient per the wifes request. Pt's wife verbalized understanding.

## 2018-01-26 ENCOUNTER — Other Ambulatory Visit: Payer: Self-pay | Admitting: Neurology

## 2018-01-26 DIAGNOSIS — I6529 Occlusion and stenosis of unspecified carotid artery: Secondary | ICD-10-CM

## 2018-01-31 ENCOUNTER — Telehealth: Payer: Self-pay | Admitting: Neurology

## 2018-01-31 ENCOUNTER — Telehealth (HOSPITAL_COMMUNITY): Payer: Self-pay | Admitting: Radiology

## 2018-01-31 ENCOUNTER — Other Ambulatory Visit (HOSPITAL_COMMUNITY): Payer: Self-pay | Admitting: Interventional Radiology

## 2018-01-31 DIAGNOSIS — I6529 Occlusion and stenosis of unspecified carotid artery: Secondary | ICD-10-CM

## 2018-01-31 NOTE — Telephone Encounter (Signed)
Pts wife and son called requesting a referral for the pt be sent to Dr. Gerome Samony Dezeshwar fax number [phone]

## 2018-01-31 NOTE — Telephone Encounter (Signed)
Spoke to patient's son August Saucer(Dean) and wife [name](Connie) today on the phone. They are requesting to see Dr. Corliss Skainseveshwar for the patient's carotid artery stenosis for opinion. We told them that there was a referral and appointment already made to Dr. Edilia Boickson and that if they wanted a referral here they would need to initiate that by contacting the referring doctor, Dr. Lucia GaskinsAhern. They said they would do that and call us back. JM

## 2018-02-01 ENCOUNTER — Other Ambulatory Visit: Payer: Self-pay | Admitting: Neurology

## 2018-02-01 DIAGNOSIS — I6529 Occlusion and stenosis of unspecified carotid artery: Secondary | ICD-10-CM

## 2018-02-01 NOTE — Telephone Encounter (Signed)
That is fine and I have placed the order. Gregory HarmanDana, can you make sure this gets to Dr. Titus Dubinevashwar please? Thank you

## 2018-02-01 NOTE — Telephone Encounter (Signed)
Returned Gregory [name]'s call (on DPR) and LVM asking for call back.

## 2018-02-01 NOTE — Telephone Encounter (Signed)
RN contacted the work number which the pt nor the wife are there right now contact at [phone]

## 2018-02-01 NOTE — Telephone Encounter (Addendum)
Spoke with the wife Junious DresserConnie (on [place]). He has an appt on March 13th w/ [facility]. The wife stated that Dr. Corliss Skainseveshwar would like to look at things and can see the patient soon for a consultation 02/06/18 @ 2:30 before he sees [facility] and will need referral. 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 Junious DresserConnie stated they are getting ready to go on a long trip so that's another reason why she took the earlier appt. Told her that RN will d/w Dr. Lucia GaskinsAhern.

## 2018-02-05 NOTE — Telephone Encounter (Signed)
[facility]. [facility] Skain[facility]war [phone]. Victorino DikeJennifer will call patient to schedule.

## 2018-02-06 ENCOUNTER — Ambulatory Visit (HOSPITAL_COMMUNITY): Payer: Medicare Other

## 2018-02-06 ENCOUNTER — Other Ambulatory Visit (HOSPITAL_COMMUNITY): Payer: Self-pay | Admitting: Interventional Radiology

## 2018-02-06 DIAGNOSIS — I771 Stricture of artery: Secondary | ICD-10-CM

## 2018-02-09 ENCOUNTER — Ambulatory Visit (HOSPITAL_COMMUNITY): Admission: RE | Admit: 2018-02-09 | Payer: Medicare Other | Source: Ambulatory Visit

## 2018-02-26 ENCOUNTER — Other Ambulatory Visit: Payer: Self-pay | Admitting: Radiology

## 2018-02-26 NOTE — Pre-Procedure Instructions (Addendum)
Gregory [name]  02/26/2018      [facility] [id number] - [place], [name] - [address] [place] [address] Phone: [phone] Fax: [phone]    Your procedure is scheduled on February 28, 2018.  [facility].  Call this number if you have problems the morning of surgery:  [phone]   Remember:  Do not eat food or drink liquids after midnight.  Take these medicines the morning of surgery with A SIP OF WATER aspirin, plavix 75 mg, propanolol (inderal), alprazolam (xanax)-if needed, amlodipine (norvasc),  Flecainide (tambocor), sertraline (zoloft).  Stop taking any Aleve, Naproxen, Ibuprofen, Motrin, Advil, Goody's, BC's, all herbal medications, fish oil, and all vitamins  Continue all other medications as instructed by your physician except follow the above medication instructions before surgery   Do not wear jewelry.  Do not wear lotions, powders, or colognes, or deodorant.  Men may shave face and neck.  Do not bring valuables to the hospital.  [facility] is not responsible for any belongings or valuables.  Contacts, dentures or bridgework may not be worn into surgery.  Leave your suitcase in the car.  After surgery it may be brought to your room.  For patients admitted to the hospital, discharge time will be determined by your treatment team.  Patients discharged the day of surgery will not be allowed to drive home.   Special instructions:  - Preparing For Surgery  Before surgery, you can play an important role. Because skin is not sterile, your skin needs to be as free of germs as possible. You can reduce the number of germs on your skin by washing with CHG (chlorahexidine gluconate) Soap before surgery.  CHG is an antiseptic cleaner which kills germs and bonds with the skin to continue killing germs even after washing.  Please do not use if you  have an allergy to CHG or antibacterial soaps. If your skin becomes reddened/irritated stop using the CHG.  Do not shave (including legs and underarms) for at least 48 hours prior to first CHG shower. It is OK to shave your face.  Please follow these instructions carefully.   1. Shower the NIGHT BEFORE SURGERY and the MORNING OF SURGERY with CHG.   2. If you chose to wash your hair, wash your hair first as usual with your normal shampoo.  3. After you shampoo, rinse your hair and body thoroughly to remove the shampoo.  4. Use CHG as you would any other liquid soap. You can apply CHG directly to the skin and wash gently with a scrungie or a clean washcloth.   5. Apply the CHG Soap to your body ONLY FROM THE NECK DOWN.  Do not use on open wounds or open sores. Avoid contact with your eyes, ears, mouth and genitals (private parts). Wash Face and genitals (private parts)  with your normal soap.  6. Wash thoroughly, paying special attention to the area where your surgery will be performed.  7. Thoroughly rinse your body with warm water from the neck down.  8. DO NOT shower/wash with your normal soap after using and rinsing off the CHG Soap.  9. Pat yourself dry with a CLEAN TOWEL.  10. Wear CLEAN PAJAMAS to bed the night before surgery, wear comfortable clothes the morning of surgery  11. Place CLEAN SHEETS on your bed the night  of your first shower and DO NOT SLEEP WITH PETS.  Day of Surgery: Do not apply any deodorants/lotions. Please wear clean clothes to the hospital/surgery center.     Please read over the following fact sheets that you were given. Pain Booklet, Coughing and Deep Breathing and Surgical Site Infection Prevention

## 2018-02-27 ENCOUNTER — Other Ambulatory Visit: Payer: Self-pay

## 2018-02-27 ENCOUNTER — Encounter (HOSPITAL_COMMUNITY): Payer: Self-pay

## 2018-02-27 ENCOUNTER — Other Ambulatory Visit: Payer: Self-pay | Admitting: Radiology

## 2018-02-27 ENCOUNTER — Encounter (HOSPITAL_COMMUNITY)
Admission: RE | Admit: 2018-02-27 | Discharge: 2018-02-27 | Disposition: A | Discharge status: Home / Self Care | Payer: Medicare Other | Source: Ambulatory Visit | Attending: Interventional Radiology | Admitting: Interventional Radiology

## 2018-02-27 DIAGNOSIS — Z8673 Personal history of transient ischemic attack (TIA), and cerebral infarction without residual deficits: Secondary | ICD-10-CM | POA: Diagnosis not present

## 2018-02-27 DIAGNOSIS — I714 Abdominal aortic aneurysm, without rupture: Secondary | ICD-10-CM | POA: Diagnosis not present

## 2018-02-27 DIAGNOSIS — Z87442 Personal history of urinary calculi: Secondary | ICD-10-CM | POA: Diagnosis not present

## 2018-02-27 DIAGNOSIS — I10 Essential (primary) hypertension: Secondary | ICD-10-CM | POA: Diagnosis not present

## 2018-02-27 DIAGNOSIS — Z7982 Long term (current) use of aspirin: Secondary | ICD-10-CM | POA: Diagnosis not present

## 2018-02-27 DIAGNOSIS — I651 Occlusion and stenosis of basilar artery: Secondary | ICD-10-CM | POA: Diagnosis not present

## 2018-02-27 DIAGNOSIS — Z888 Allergy status to other drugs, medicaments and biological substances status: Secondary | ICD-10-CM | POA: Diagnosis not present

## 2018-02-27 DIAGNOSIS — Z7902 Long term (current) use of antithrombotics/antiplatelets: Secondary | ICD-10-CM | POA: Diagnosis not present

## 2018-02-27 DIAGNOSIS — E785 Hyperlipidemia, unspecified: Secondary | ICD-10-CM | POA: Diagnosis not present

## 2018-02-27 DIAGNOSIS — Z79899 Other long term (current) drug therapy: Secondary | ICD-10-CM | POA: Diagnosis not present

## 2018-02-27 DIAGNOSIS — Z87891 Personal history of nicotine dependence: Secondary | ICD-10-CM | POA: Diagnosis not present

## 2018-02-27 DIAGNOSIS — F329 Major depressive disorder, single episode, unspecified: Secondary | ICD-10-CM | POA: Diagnosis not present

## 2018-02-27 DIAGNOSIS — I6522 Occlusion and stenosis of left carotid artery: Secondary | ICD-10-CM | POA: Diagnosis not present

## 2018-02-27 HISTORY — DX: Personal history of urinary calculi: Z87.442

## 2018-02-27 HISTORY — DX: Abdominal aortic aneurysm, without rupture, unspecified: I71.40

## 2018-02-27 HISTORY — DX: Abdominal aortic aneurysm, without rupture: I71.4

## 2018-02-27 LAB — COMPREHENSIVE METABOLIC PANEL
ALBUMIN: 4.1 g/dL (ref 3.5–5.0)
ALT: 25 U/L (ref 17–63)
ANION GAP: 9 (ref 5–15)
AST: 29 U/L (ref 15–41)
Alkaline Phosphatase: 64 U/L (ref 38–126)
BUN: 16 mg/dL (ref 6–20)
CO2: 28 mmol/L (ref 22–32)
Calcium: 9.7 mg/dL (ref 8.9–10.3)
Chloride: 102 mmol/L (ref 101–111)
Creatinine, Ser: 1.09 mg/dL (ref 0.61–1.24)
GFR calc Af Amer: >60 mL/min (ref >60)
GFR calc non Af Amer: >60 mL/min (ref >60)
GLUCOSE: 81 mg/dL (ref 65–99)
POTASSIUM: 4.1 mmol/L (ref 3.5–5.1)
Sodium: 139 mmol/L (ref 135–145)
TOTAL PROTEIN: 7 g/dL (ref 6.5–8.1)
Total Bilirubin: 1 mg/dL (ref 0.3–1.2)

## 2018-02-27 LAB — CBC WITH DIFFERENTIAL/PLATELET
BASOS ABS: 0 10*3/uL (ref 0.0–0.1)
BASOS PCT: 0 %
Eosinophils Absolute: 0.2 10*3/uL (ref 0.0–0.7)
Eosinophils Relative: 2 %
HCT: 40.2 % (ref 39.0–52.0)
Hemoglobin: 13.3 g/dL (ref 13.0–17.0)
Lymphocytes Relative: 23 %
Lymphs Abs: 1.8 10*3/uL (ref 0.7–4.0)
MCH: 32.3 pg (ref 26.0–34.0)
MCHC: 33.1 g/dL (ref 30.0–36.0)
MCV: 97.6 fL (ref 78.0–100.0)
MONO ABS: 0.7 10*3/uL (ref 0.1–1.0)
MONOS PCT: 9 %
NEUTROS ABS: 5.2 10*3/uL (ref 1.7–7.7)
NEUTROS PCT: 66 %
PLATELETS: 205 10*3/uL (ref 150–400)
RBC: 4.12 MIL/uL — ABNORMAL LOW (ref 4.22–5.81)
RDW: 12.5 % (ref 11.5–15.5)
WBC: 7.9 10*3/uL (ref 4.0–10.5)

## 2018-02-27 LAB — PROTIME-INR
INR: 1.02
Prothrombin Time: 13.3 seconds (ref 11.4–15.2)

## 2018-02-27 LAB — PLATELET INHIBITION P2Y12: Platelet Function  P2Y12: 145 [PRU] — ABNORMAL LOW (ref 194–418)

## 2018-02-27 LAB — APTT: APTT: 36 s (ref 24–36)

## 2018-02-27 NOTE — Progress Notes (Addendum)
Anesthesia Chart Review: Patient is a 84 year old male scheduled for cerebral arteriogram with possible LICA angioplasty/stent placement on 02/28/18 by Dr. Luanne Bras. 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 Kathryne Eriksson), right carotid endarterectomy.    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  - PCP is Dr. Kathryne Eriksson with [facility] (see Care Everywhere). Last visit 08/09/17. - Neurologist is Dr. Sarina Ill. Last visit 01/02/18. Patient had a tremor in the setting of medication management for mood disorder. Symptoms improved with changes in medication. Brain MRI was done as part of work-up and showed remote infarcts. Carotid imaging done which showed 38% LICA stenosis. Referral was made to vascular surgery, but family also requested consult with IR first.  - He is not routinely followed by cardiology, but by notes has seen Dr. Danie Binder in the past. According to Dr. Dois Davenport 04/27/16 note, patient can return to cardiology as needed, and Dr. Redmond Pulling could prescribe flecainide (taking for PACs) as needed. He doesn't recall any specific cardiac testing, but records requested as available from Dr. Andrey Campanile former offices ([facility]).  Meds include Xanax, amlodipine, aspirin 81 mg, Lipitor, Plavix, flecainide, Remeron, fish oil, Inderal, ramipril, sertraline.  BP 122/85   Pulse 67   Temp 36.9 C   Resp 20   Ht 6' 1"  (1.854 m)   Wt 183 lb (83 kg)   SpO2 98%   BMI 24.14 kg/m   EKG 02/27/18: SB at 58 bpm, with first degree AV block.   CTA head/neck 01/24/18: IMPRESSION: 1. Chronic right MCA infarcts. Mild chronic microvascular ischemic change in the white matter. No acute  intracranial abnormality 2. Postop right carotid endarterectomy without significant stenosis on the right 3. 80% diameter stenosis left internal carotid artery at the origin due to calcific plaque 4. Moderate stenosis of the supraclinoid internal carotid artery bilaterally due to calcific plaque 5. Otherwise no significant intracranial stenosis. Negative for large vessel occlusion  MRI brain 07/28/17: IMPRESSION:   This MRI of the brain with and without contrast shows the following: 1.    There is encephalomalacia in the right frontal parietal lobes due to two remote strokes in the right middle cerebral artery distribution.    2.    Scattered T2/FLAIR hyperintense foci consistent with mild chronic microvascular ischemic change. 3.    Mild to moderate cortical atrophy. 4.    There are no acute findings.  Abdominal aortic U/S 09/07/16: FINDINGS: There is a distal abdominal aortic aneurysm. Aneurysm measures 3.2 cm in the AP dimension and 3.7 cm in the transverse dimension. Previously, the aorta measured 3.2 cm AP dimension and 3.3 cm in the transverse dimension. Irregularity in the distal abdominal aorta is compatible with atherosclerotic disease. Aneurysm appears to terminate at the aortic bifurcation. The mid abdominal aorta measures up to 2.4 cm and the proximal abdominal aorta measures 2.4 cm. Maximum Diameter: 3.7 cm IMPRESSION: Abdominal aortic aneurysm measuring up to 3.7 cm. There has been mild enlargement since 2014. [Previous ultrasound on 10/17/13 measured AAA 3.3 cm in greatest dimension. AAA measured 3.0 cm by CT 01/05/05.] (According to UpToDate, "Updated guidelines from the Society for Vascular Surgery (SVS) support longer surveillance intervals for small AAAs and suggest the following surveillance schedule [5]: For interval ultrasound  screen aortic diameter > 2.5 cm but < 3.0 cm, rescreening after 10 years. For AAA 3.0 to 3.9 cm, imaging at 3-year intervals. For AAA 4.0  to 4.9 cm, imaging at 77-monthintervals. For AAA 5.0 to 5.4 cm, imaging at 669-monthntervals." This does not specifically account for rate of growth, co-morbidities, or other risk factors.) Reportedly, he says he is due for AAA follow-up later this year per Dr. WiRedmond Pulling  Preoperative labs noted. H/H 13.3/40.2, PLT 205. PT/PTT WNL. p2y12 145. Cr 1.09. Glucose 81. LFTs WNL.   He denied SOB, chest pain, fever, cough at PAT. Currently, no additional records received. Based on available information, I would anticipate that he can proceed as planned if no acute changes.  [facility]/Anesthesiology Phone ([phone]/11/2018 4:51 PM  Add[facility]um[facility] Last visit was with Dr. WaJimmie Mollyn 11/27/14. He recommended 6 month follow-up or PRN at Dr. WiDois Davenportiscretion (if he was comfortable refilling flecainide). As above, Dr. WiRedmond Pullings managing currently.   Echo 09/10/14: Conclusion: Mild LVH. Normal LV systolic function. LVEF 65%. Mild MAC with mild MR. Mildly dilated aortic root (AoD 3.9 cm). Normal RV size and function.  [place] 08/27/14 0 09/09/14: Final Interpretation: SR with rare PACs and rare PVCs. 10 episodes of atrial tachycardia lasting 4-16 beats in duration with an average rate of 132 beats per minute.  [facility]/Anesthesiology Phone ([phone]/11/2018 5:30 PM

## 2018-02-27 NOTE — Progress Notes (Signed)
PCP - Benedetto GoadFred Wilson Cardiologist - denies  Chest x-ray - not needed EKG - 02/27/18 Stress Test - denies ECHO - denies Cardiac Cath - denies   Blood Thinner Instructions: continue plavix Aspirin Instructions: continue Aspirin  Anesthesia review: yes  Patient denies shortness of breath, fever, cough and chest pain at PAT appointment   Patient verbalized understanding of instructions that were given to them at the PAT appointment. Patient was also instructed that they will need to review over the PAT instructions again at home before surgery.

## 2018-02-27 NOTE — Pre-Procedure Instructions (Signed)
Gregory GhaziClaude D [name]  02/27/2018      [facility] [phone] - [place], [place] - [address] [place] [id number] Phone: [phone] Fax: [phone]    Your procedure is scheduled on February 28, 2018.  [facility].  Call this number if you have problems the morning of surgery:  [phone]   Remember:  Do not eat food or drink liquids after midnight.  Take these medicines the morning of surgery with A SIP OF WATER  ALPRAZolam (XANAX) amLODipine (NORVASC) Aspirin clopidogrel (PLAVIX)  propranolol (INDERAL) sertraline (ZOLOFT)   Stop taking any Aleve, Naproxen, Ibuprofen, Motrin, Advil, Goody's, BC's, all herbal medications, fish oil, and all vitamins  Continue all other medications as instructed by your physician except follow the above medication instructions before surgery   Do not wear jewelry.  Do not wear lotions, powders, or colognes, or deodorant.  Men may shave face and neck.  Do not bring valuables to the hospital.  [facility] is not responsible for any belongings or valuables.  Contacts, dentures or bridgework may not be worn into surgery.  Leave your suitcase in the car.  After surgery it may be brought to your room.  For patients admitted to the hospital, discharge time will be determined by your treatment team.  Patients discharged the day of surgery will not be allowed to drive home.   Special instructions:  [facility]- Preparing For Surgery  Before surgery, you can play an important role. Because skin is not sterile, your skin needs to be as free of germs as possible. You can reduce the number of germs on your skin by washing with CHG (chlorahexidine gluconate) Soap before surgery.  CHG is an antiseptic cleaner which kills germs and bonds with the skin to continue killing germs even after washing.  Please do not use if you have an allergy to CHG or  antibacterial soaps. If your skin becomes reddened/irritated stop using the CHG.  Do not shave (including legs and underarms) for at least 48 hours prior to first CHG shower. It is OK to shave your face.  Please follow these instructions carefully.   1. Shower the NIGHT BEFORE SURGERY and the MORNING OF SURGERY with CHG.   2. If you chose to wash your hair, wash your hair first as usual with your normal shampoo.  3. After you shampoo, rinse your hair and body thoroughly to remove the shampoo.  4. Use CHG as you would any other liquid soap. You can apply CHG directly to the skin and wash gently with a scrungie or a clean washcloth.   5. Apply the CHG Soap to your body ONLY FROM THE NECK DOWN.  Do not use on open wounds or open sores. Avoid contact with your eyes, ears, mouth and genitals (private parts). Wash Face and genitals (private parts)  with your normal soap.  6. Wash thoroughly, paying special attention to the area where your surgery will be performed.  7. Thoroughly rinse your body with warm water from the neck down.  8. DO NOT shower/wash with your normal soap after using and rinsing off the CHG Soap.  9. Pat yourself dry with a CLEAN TOWEL.  10. Wear CLEAN PAJAMAS to bed the night before surgery, wear comfortable clothes the morning of surgery  11. Place CLEAN SHEETS on your bed the night of your  first shower and DO NOT SLEEP WITH PETS.  Day of Surgery: Do not apply any deodorants/lotions. Please wear clean clothes to the hospital/surgery center.     Please read over the following fact sheets that you were given. Pain Booklet, Coughing and Deep Breathing and Surgical Site Infection Prevention

## 2018-02-28 ENCOUNTER — Encounter: Payer: Medicare Other | Admitting: Vascular Surgery

## 2018-02-28 ENCOUNTER — Encounter (HOSPITAL_COMMUNITY): Payer: Self-pay | Admitting: Certified Registered Nurse Anesthetist

## 2018-02-28 ENCOUNTER — Ambulatory Visit (HOSPITAL_COMMUNITY)
Admission: RE | Admit: 2018-02-28 | Discharge: 2018-02-28 | Disposition: A | Discharge status: Home / Self Care | Payer: Medicare Other | Source: Ambulatory Visit | Attending: Interventional Radiology | Admitting: Interventional Radiology

## 2018-02-28 ENCOUNTER — Encounter (HOSPITAL_COMMUNITY)
Admission: RE | Disposition: A | Discharge status: Home / Self Care | Payer: Self-pay | Source: Ambulatory Visit | Attending: Interventional Radiology

## 2018-02-28 ENCOUNTER — Other Ambulatory Visit: Payer: Self-pay | Admitting: Student

## 2018-02-28 ENCOUNTER — Encounter (HOSPITAL_COMMUNITY): Payer: Self-pay

## 2018-02-28 ENCOUNTER — Ambulatory Visit (HOSPITAL_COMMUNITY): Payer: Medicare Other | Admitting: Certified Registered Nurse Anesthetist

## 2018-02-28 DIAGNOSIS — I771 Stricture of artery: Secondary | ICD-10-CM

## 2018-02-28 DIAGNOSIS — Z888 Allergy status to other drugs, medicaments and biological substances status: Secondary | ICD-10-CM | POA: Insufficient documentation

## 2018-02-28 DIAGNOSIS — I6522 Occlusion and stenosis of left carotid artery: Secondary | ICD-10-CM | POA: Insufficient documentation

## 2018-02-28 DIAGNOSIS — I714 Abdominal aortic aneurysm, without rupture: Secondary | ICD-10-CM | POA: Insufficient documentation

## 2018-02-28 DIAGNOSIS — I651 Occlusion and stenosis of basilar artery: Secondary | ICD-10-CM | POA: Insufficient documentation

## 2018-02-28 DIAGNOSIS — I10 Essential (primary) hypertension: Secondary | ICD-10-CM | POA: Diagnosis not present

## 2018-02-28 DIAGNOSIS — Z8673 Personal history of transient ischemic attack (TIA), and cerebral infarction without residual deficits: Secondary | ICD-10-CM | POA: Insufficient documentation

## 2018-02-28 DIAGNOSIS — Z87442 Personal history of urinary calculi: Secondary | ICD-10-CM | POA: Insufficient documentation

## 2018-02-28 DIAGNOSIS — Z87891 Personal history of nicotine dependence: Secondary | ICD-10-CM | POA: Insufficient documentation

## 2018-02-28 DIAGNOSIS — E785 Hyperlipidemia, unspecified: Secondary | ICD-10-CM | POA: Diagnosis not present

## 2018-02-28 DIAGNOSIS — Z79899 Other long term (current) drug therapy: Secondary | ICD-10-CM | POA: Insufficient documentation

## 2018-02-28 DIAGNOSIS — Z7902 Long term (current) use of antithrombotics/antiplatelets: Secondary | ICD-10-CM | POA: Insufficient documentation

## 2018-02-28 DIAGNOSIS — F329 Major depressive disorder, single episode, unspecified: Secondary | ICD-10-CM | POA: Insufficient documentation

## 2018-02-28 DIAGNOSIS — Z7982 Long term (current) use of aspirin: Secondary | ICD-10-CM | POA: Insufficient documentation

## 2018-02-28 HISTORY — PX: IR ANGIO VERTEBRAL SEL SUBCLAVIAN INNOMINATE UNI R MOD SED: IMG5365

## 2018-02-28 HISTORY — PX: RADIOLOGY WITH ANESTHESIA: SHX6223

## 2018-02-28 HISTORY — PX: IR ANGIO INTRA EXTRACRAN SEL COM CAROTID INNOMINATE BILAT MOD SED: IMG5360

## 2018-02-28 SURGERY — IR WITH ANESTHESIA
Anesthesia: Monitor Anesthesia Care

## 2018-02-28 MED ORDER — CEFAZOLIN SODIUM-DEXTROSE 2-4 GM/100ML-% IV SOLN
2.0000 g | INTRAVENOUS | Status: DC
  Filled 2018-02-28 (×2): qty 100

## 2018-02-28 MED ORDER — IOPAMIDOL (ISOVUE-300) INJECTION 61%
INTRAVENOUS | Status: AC
  Administered 2018-02-28: 72 mL
  Filled 2018-02-28: qty 150

## 2018-02-28 MED ORDER — LACTATED RINGERS IV SOLN
INTRAVENOUS | Status: DC
  Administered 2018-02-28: 08:00:00 via INTRAVENOUS

## 2018-02-28 MED ORDER — CLOPIDOGREL BISULFATE 75 MG PO TABS
75.0000 mg | ORAL_TABLET | ORAL | Status: DC
  Filled 2018-02-28: qty 1

## 2018-02-28 MED ORDER — LIDOCAINE HCL 1 % IJ SOLN
INTRAMUSCULAR | Status: AC
  Filled 2018-02-28: qty 20

## 2018-02-28 MED ORDER — ASPIRIN 81 MG PO CHEW
CHEWABLE_TABLET | ORAL | Status: AC
  Filled 2018-02-28: qty 3

## 2018-02-28 MED ORDER — FENTANYL CITRATE (PF) 250 MCG/5ML IJ SOLN
INTRAMUSCULAR | Status: DC | PRN
  Administered 2018-02-28: 25 ug via INTRAVENOUS

## 2018-02-28 MED ORDER — NIMODIPINE 30 MG PO CAPS
0.0000 mg | ORAL_CAPSULE | ORAL | Status: DC
  Filled 2018-02-28: qty 2

## 2018-02-28 MED ORDER — NITROGLYCERIN 1 MG/10 ML FOR IR/CATH LAB
INTRA_ARTERIAL | Status: DC
Start: 2018-02-28 — End: 2018-02-28
  Filled 2018-02-28: qty 10

## 2018-02-28 MED ORDER — FENTANYL CITRATE (PF) 250 MCG/5ML IJ SOLN
INTRAMUSCULAR | Status: AC
  Filled 2018-02-28: qty 5

## 2018-02-28 MED ORDER — CLOPIDOGREL BISULFATE 75 MG PO TABS
ORAL_TABLET | ORAL | Status: AC | PRN
  Administered 2018-02-28: 75 mg via ORAL

## 2018-02-28 MED ORDER — CLOPIDOGREL BISULFATE 75 MG PO TABS
ORAL_TABLET | ORAL | Status: AC
  Filled 2018-02-28: qty 1

## 2018-02-28 MED ORDER — IOPAMIDOL (ISOVUE-300) INJECTION 61%
INTRAVENOUS | Status: AC
  Filled 2018-02-28: qty 150

## 2018-02-28 MED ORDER — ASPIRIN 81 MG PO CHEW
253.0000 mg | CHEWABLE_TABLET | Freq: Once | ORAL | Status: AC
  Administered 2018-02-28: 243 mg via ORAL

## 2018-02-28 MED ORDER — HEPARIN SODIUM (PORCINE) 1000 UNIT/ML IJ SOLN
INTRAMUSCULAR | Status: DC | PRN
  Administered 2018-02-28: 1000 [IU] via INTRAVENOUS

## 2018-02-28 MED ORDER — MIDAZOLAM HCL 2 MG/2ML IJ SOLN
INTRAMUSCULAR | Status: AC
  Filled 2018-02-28: qty 2

## 2018-02-28 MED ORDER — LIDOCAINE HCL (PF) 1 % IJ SOLN
INTRAMUSCULAR | Status: AC | PRN
  Administered 2018-02-28: 10 mL

## 2018-02-28 MED ORDER — ASPIRIN EC 325 MG PO TBEC
325.0000 mg | DELAYED_RELEASE_TABLET | ORAL | Status: DC
  Filled 2018-02-28: qty 1

## 2018-02-28 MED ORDER — SODIUM CHLORIDE 0.9 % IV SOLN
INTRAVENOUS | Status: DC
  Administered 2018-02-28: 09:00:00 via INTRAVENOUS

## 2018-02-28 MED ORDER — SODIUM CHLORIDE 0.9 % IV SOLN
INTRAVENOUS | Status: DC
  Administered 2018-02-28: 13:00:00 via INTRAVENOUS

## 2018-02-28 NOTE — Transfer of Care (Signed)
Immediate Anesthesia Transfer of Care Note  Patient: Gregory [name]  Procedure(s) Performed: STENTING (N/A )  Patient Location: PACU  Anesthesia Type:MAC  Level of Consciousness: awake, alert , oriented, patient cooperative and responds to stimulation  Airway & Oxygen Therapy: Patient Spontanous Breathing  Post-op Assessment: Report given to RN, Post -op Vital signs reviewed and stable and Patient moving all extremities X 4  Post vital signs: Reviewed and stable  Last Vitals:  Vitals:   02/28/18 0728  BP: (!) 111/53  Pulse: (!) 50  Resp: 20  Temp: (!) 36.3 C  SpO2: 99%    Last Pain:  Vitals:   02/28/18 0728  TempSrc: Oral      Patients Stated Pain Goal: 3 (09/47/09 6283)  Complications: No apparent anesthesia complications

## 2018-02-28 NOTE — Discharge Instructions (Signed)

## 2018-02-28 NOTE — Progress Notes (Signed)
Patient transported to PACU with CRNA. Report given to PACU RN. 

## 2018-02-28 NOTE — Procedures (Signed)
Bilateral common carotid arterioogram and RT vertbral angiogram. RT CFA approach. Findings. 1 Approx 65 % to 71 % stenosis of Lt ICA prox without intraluminal defects.. 2. RT ICA widely patent

## 2018-02-28 NOTE — Anesthesia Procedure Notes (Signed)
Procedure Name: MAC Date/Time: 02/28/2018 9:07 AM Performed by: Glynda Jaeger, CRNA Pre-anesthesia Checklist: Patient identified, Emergency Drugs available, Suction available, Timeout performed and Patient being monitored Patient Re-evaluated:Patient Re-evaluated prior to induction Oxygen Delivery Method: Nasal cannula Placement Confirmation: positive ETCO2

## 2018-02-28 NOTE — H&P (Signed)
Chief Complaint: Patient was seen in consultation today for cerebral arteriogram with possible LICA angioplasty/stent placement.  Referring Physician(s): None  Supervising Physician: Julieanne Cotton  Patient Status: [facility] - Out-pt  History of Present Illness: Gregory [name] is a 84 y.o. male   Hx includes former smoker, right MCA stroke (2014), bilateral carotid stenosis with right carotid endarterectomy (15 years ago per patient).  CT angio head and neck 01/24/2018: 1. Chronic right MCA infarcts. Mild chronic microvascular ischemic change in the white matter. No acute intracranial abnormality 2. Postop right carotid endarterectomy without significant stenosis on the right 3. 80% diameter stenosis left internal carotid artery at the origin due to calcific plaque 4. Moderate stenosis of the supraclinoid internal carotid artery bilaterally due to calcific plaque 5. Otherwise no significant intracranial stenosis. Negative for large vessel occlusion  Referral made to vascular surgery, but patient requested IR consult first as he has family members who have seen Dr. Corliss Skains for intervention in the past. Denies fever and symptoms of carotid stenosis, inlcuding headaches, weakness, numbness, memory changes. Does complain of "difficulty finding words" during speech, but this is not a new finding as it has been present since his previous stroke in 2014. Vitals, medications, labs, and imaging reviewed. Plan for cerebral arteriogram today, will determine need for LICA angioplasty/stent placement based on arteriogram findings.  Past Medical History:  Diagnosis Date  . Abdominal aneurysm (HCC)   . Depression   . History of kidney stones   . Hyperlipidemia   . Hypertension   . Stroke [facility]) 2004?   TIA    Past Surgical History:  Procedure Laterality Date  . COLON SURGERY     polyps removed  . COLONOSCOPY    . EYE SURGERY     cataract right  . OTHER SURGICAL HISTORY     Carotid     Allergies: Bupropion and Valproic acid  Medications: Prior to Admission medications   Medication Sig Start Date End Date Taking? Authorizing Provider  ALPRAZolam (XANAX) 0.5 MG tablet TAKE 1 TABLET DURING THE DAY, AND 2 TABLETS AT BEDTIME PRN FOR ANXIETY AND INSOMNIA. 03/17/17   [provider]  amLODipine (NORVASC) 5 MG tablet Take 5 mg by mouth daily.    [provider]  aspirin 81 MG tablet Take 81 mg by mouth daily.    [provider]  atorvastatin (LIPITOR) 40 MG tablet TAKE 1 TABLET BY MOUTH EVERY DAY TO LOWER CHOLESTEROL. 03/17/17   [provider]  Cholecalciferol (D3 VITAMIN PO) Take 2,000 Int'l Units by mouth daily.    [provider]  clopidogrel (PLAVIX) 75 MG tablet Take 75 mg by mouth daily.    [provider]  flecainide (TAMBOCOR) 50 MG tablet Take 50 mg by mouth 2 (two) times daily.  03/16/17   [provider]  Glucosamine HCl (GLUCOSAMINE PO) Take 1,500 mg by mouth daily.    [provider]  mirtazapine (REMERON) 7.5 MG tablet Take 7.5 mg by mouth at bedtime.  03/16/17   [provider]  Multiple Vitamins-Minerals (CENTRUM ADULTS PO) Take 1 tablet by mouth daily.     [provider]  Omega-3 Fatty Acids (FISH OIL) 1200 MG CAPS Take 1 capsule by mouth daily.     [provider]  Omega-3 Fatty Acids (OMEGA 3 PO) Take 500 mg by mouth daily.     [provider]  propranolol (INDERAL) 10 MG tablet TK 1-2 TS PO BID AS NEEDED FOR TREMORS 05/10/17  [provider]  ramipril (ALTACE) 10 MG capsule Take 10 mg by mouth daily.    [provider]  sertraline (ZOLOFT) 50 MG tablet TAKING 150 MG  PO D 06/05/17   [provider]     Family History  Problem Relation Age of Onset  . Cancer Mother        breast  . Heart disease Mother   . Heart disease Brother   . Tremor Neg Hx     Social History   Socioeconomic History  . Marital status:  Married    Spouse name: None  . Number of children: 2  . Years of education: None  . Highest education level: Some college, no degree  Social Needs  . Financial resource strain: None  . Food insecurity - worry: None  . Food insecurity - inability: None  . Transportation needs - medical: None  . Transportation needs - non-medical: None  Occupational History  . None  Tobacco Use  . Smoking status: Former Games developermoker  . Smokeless tobacco: Never Used  Substance and Sexual Activity  . Alcohol use: No  . Drug use: No  . Sexual activity: None  Other Topics Concern  . None  Social History Narrative   Lives at home w/ his wife   Right-handed   Caffeine: occasional coffee and tea, decaf tea at home     Review of Systems: A 12 point ROS discussed and pertinent positives are indicated in the HPI above.  All other systems are negative.  Review of Systems  Constitutional: Negative for activity change and fever.  Respiratory: Negative for cough, shortness of breath and wheezing.   Cardiovascular: Negative for chest pain and palpitations.  Gastrointestinal: Negative for nausea and vomiting.  Neurological: Negative for dizziness, weakness, numbness and headaches.  Psychiatric/Behavioral: Negative for behavioral problems, confusion and decreased concentration.    Vital Signs: There were no vitals taken for this visit.  Physical Exam  Constitutional: He is oriented to person, place, and time. He appears well-developed and well-nourished.  Cardiovascular: Normal rate, regular rhythm and normal heart sounds.  No murmur heard. Distal pulses 2+ bilaterally. No carotid bruits.  Pulmonary/Chest: Effort normal and breath sounds normal. He has no wheezes.  Musculoskeletal: Normal range of motion. He exhibits no tenderness.  Neurological: He is alert and oriented to person, place, and time.  Left arm slightly weaker than right. Left arm point discrimination delayed compared to right arm.  Skin:  Skin is warm and dry.  Psychiatric: He has a normal mood and affect. His behavior is normal. Judgment and thought content normal.  Nursing note and vitals reviewed.    MD Evaluation Airway: WNL Heart: WNL Abdomen: WNL Chest/ Lungs: WNL ASA  Classification: 3 Mallampati/Airway Score: One   Imaging: No results found.   Labs:  CBC: Recent Labs    02/27/18 1542  WBC 7.9  HGB 13.3  HCT 40.2  PLT 205    COAGS: Recent Labs    02/27/18 1542  INR 1.02  APTT 36    BMP: Recent Labs    01/02/18 1456 02/27/18 1542  NA 142 139  K 4.7 4.1  CL 103 102  CO2 24 28  GLUCOSE 154* 81  BUN 11 16  CALCIUM 9.8 9.7  CREATININE 1.13 1.09  GFRNONAA 61 >60  GFRAA 70 >60    LIVER FUNCTION TESTS: Recent Labs    02/27/18 1542  BILITOT 1.0  AST 29  ALT 25  ALKPHOS 64  PROT  7.0  ALBUMIN 4.1    TUMOR MARKERS: No results for input(s): AFPTM, CEA, CA199, CHROMGRNA in the last 8760 hours.  Assessment and Plan:  Asymptomatic left carotid stenosis based on CT angio.  Patient is NPO as of midnight. He is on ASA and has been taking Plavix since Monday (02/26/2018). P2Y12 was 145 this am. Plan for cerebral arteriogram this morning along with possible LICA angioplasty/stent placement based on results of arteriogram.   Risks and benefits of cerebral angiogram with intervention were discussed with the patient including, but not limited to bleeding, infection, vascular injury, contrast induced renal failure, stroke or even death.  This interventional procedure involves the use of X-rays and because of the nature of the planned procedure, it is possible that we will have prolonged use of X-ray fluoroscopy.  Potential radiation risks to you include (but are not limited to) the following: - A slightly elevated risk for cancer  several years later in life. This risk is typically less than 0.5% percent. This risk is low in comparison to the normal incidence of human cancer, which is  33% for women and 50% for men according to the American Cancer Society. - Radiation induced injury can include skin redness, resembling a rash, tissue breakdown / ulcers and hair loss (which can be temporary or permanent).   The likelihood of either of these occurring depends on the difficulty of the procedure and whether you are sensitive to radiation due to previous procedures, disease, or genetic conditions.   IF your procedure requires a prolonged use of radiation, you will be notified and given written instructions for further action.  It is your responsibility to monitor the irradiated area for the 2 weeks following the procedure and to notify your physician if you are concerned that you have suffered a radiation induced injury.    All of the patient's questions were answered, patient is agreeable to proceed.  Consent signed and in chart.   Thank you for this interesting consult.  I greatly enjoyed meeting Gregory [name] and look forward to participating in their care.  A copy of this report was sent to the requesting provider on this date.  Electronically Signed: Elwin Mocha, PA-C 02/28/2018, 11:32 AM   I spent a total of 30 Minutes  in face to face in clinical consultation, greater than 50% of which was counseling/coordinating care for left carotid stenosis and cerebral arteriogram with possible LICA angioplasty/stent placement.

## 2018-02-28 NOTE — H&P (Deleted)
  The note originally documented on this encounter has been moved the the encounter in which it belongs.  

## 2018-02-28 NOTE — Anesthesia Postprocedure Evaluation (Signed)
Anesthesia Post Note  Patient: Gregory [name]  Procedure(s) Performed: STENTING (N/A )     Patient location during evaluation: PACU Anesthesia Type: MAC Level of consciousness: awake and alert Pain management: pain level controlled Vital Signs Assessment: post-procedure vital signs reviewed and stable Respiratory status: spontaneous breathing, nonlabored ventilation and respiratory function stable Cardiovascular status: stable and blood pressure returned to baseline Postop Assessment: no apparent nausea or vomiting Anesthetic complications: no    Last Vitals:  Vitals:   02/28/18 1200 02/28/18 1215  BP: (!) 93/55 95/66  Pulse: (!) 47 (!) 47  Resp: 12 12  Temp:  36.8 C  SpO2: 97% 97%    Last Pain:  Vitals:   02/28/18 0728  TempSrc: Oral                 Stephanie Mcglone,W. EDMOND

## 2018-02-28 NOTE — Anesthesia Preprocedure Evaluation (Addendum)
Anesthesia Evaluation  Patient identified by MRN, date of birth, ID band Patient awake    Reviewed: Allergy & Precautions, H&P , NPO status , Patient's Chart, lab work & pertinent test results  Airway Mallampati: II  TM Distance: >3 FB Neck ROM: Full    Dental no notable dental hx. (+) Teeth Intact, Dental Advisory Given   Pulmonary neg pulmonary ROS, former smoker,    Pulmonary exam normal breath sounds clear to auscultation       Cardiovascular hypertension, Pt. on medications  Rhythm:Regular Rate:Normal     Neuro/Psych Depression CVA    GI/Hepatic negative GI ROS, Neg liver ROS,   Endo/Other  negative endocrine ROS  Renal/GU negative Renal ROS  negative genitourinary   Musculoskeletal   Abdominal   Peds  Hematology negative hematology ROS (+)   Anesthesia Other Findings   Reproductive/Obstetrics negative OB ROS                            Anesthesia Physical Anesthesia Plan  ASA: III  Anesthesia Plan: MAC   Post-op Pain Management:    Induction: Intravenous  PONV Risk Score and Plan: 3 and Ondansetron and Dexamethasone  Airway Management Planned: Simple Face Mask  Additional Equipment:   Intra-op Plan:   Post-operative Plan:   Informed Consent: I have reviewed the patients History and Physical, chart, labs and discussed the procedure including the risks, benefits and alternatives for the proposed anesthesia with the patient or authorized representative who has indicated his/her understanding and acceptance.   Dental advisory given  Plan Discussed with: CRNA  Anesthesia Plan Comments:        Anesthesia Quick Evaluation

## 2018-03-01 ENCOUNTER — Encounter (HOSPITAL_COMMUNITY): Payer: Self-pay | Admitting: Interventional Radiology

## 2018-03-03 ENCOUNTER — Encounter (HOSPITAL_COMMUNITY): Payer: Self-pay | Admitting: Interventional Radiology

## 2018-03-26 ENCOUNTER — Other Ambulatory Visit: Payer: Self-pay | Admitting: Family Medicine

## 2018-03-26 DIAGNOSIS — I714 Abdominal aortic aneurysm, without rupture, unspecified: Secondary | ICD-10-CM

## 2018-04-03 ENCOUNTER — Ambulatory Visit
Admission: RE | Admit: 2018-04-03 | Discharge: 2018-04-03 | Disposition: A | Discharge status: Home / Self Care | Payer: Medicare Other | Source: Ambulatory Visit | Attending: Family Medicine | Admitting: Family Medicine

## 2018-04-03 DIAGNOSIS — I714 Abdominal aortic aneurysm, without rupture, unspecified: Secondary | ICD-10-CM

## 2018-04-09 ENCOUNTER — Other Ambulatory Visit: Payer: Self-pay | Admitting: Family Medicine

## 2018-04-09 DIAGNOSIS — I714 Abdominal aortic aneurysm, without rupture, unspecified: Secondary | ICD-10-CM

## 2018-04-12 ENCOUNTER — Ambulatory Visit
Admission: RE | Admit: 2018-04-12 | Discharge: 2018-04-12 | Disposition: A | Discharge status: Home / Self Care | Payer: Medicare Other | Source: Ambulatory Visit | Attending: Family Medicine | Admitting: Family Medicine

## 2018-04-12 DIAGNOSIS — I714 Abdominal aortic aneurysm, without rupture, unspecified: Secondary | ICD-10-CM

## 2018-04-12 MED ORDER — IOPAMIDOL (ISOVUE-300) INJECTION 61%
100.0000 mL | Freq: Once | INTRAVENOUS | Status: AC | PRN
  Administered 2018-04-12: 100 mL via INTRAVENOUS

## 2018-05-15 ENCOUNTER — Telehealth (HOSPITAL_COMMUNITY): Payer: Self-pay

## 2018-05-15 NOTE — Telephone Encounter (Signed)
Called to schedule US carotid, wife will return call to schedule. AW

## 2018-07-02 ENCOUNTER — Other Ambulatory Visit (HOSPITAL_COMMUNITY): Payer: Self-pay | Admitting: Interventional Radiology

## 2018-07-02 DIAGNOSIS — I771 Stricture of artery: Secondary | ICD-10-CM

## 2018-07-24 ENCOUNTER — Encounter (HOSPITAL_COMMUNITY): Payer: Medicare Other

## 2018-07-31 ENCOUNTER — Ambulatory Visit (HOSPITAL_COMMUNITY)
Admission: RE | Admit: 2018-07-31 | Discharge: 2018-07-31 | Disposition: A | Discharge status: Home / Self Care | Payer: Medicare Other | Source: Ambulatory Visit | Attending: Interventional Radiology | Admitting: Interventional Radiology

## 2018-07-31 DIAGNOSIS — I771 Stricture of artery: Secondary | ICD-10-CM | POA: Diagnosis present

## 2018-07-31 NOTE — Progress Notes (Signed)
Bilateral carotid duplex completed - Preliminary results - 1% to 39% ICA stenosis bilaterally . Vertebral artery flow is antegrade. [employer] [employer] Avrey [name], RVS 07/31/2018 11:01 AM

## 2018-08-02 ENCOUNTER — Telehealth (HOSPITAL_COMMUNITY): Payer: Self-pay

## 2018-08-02 NOTE — Telephone Encounter (Signed)
Pt's wife agreed to have pt f/u in 6 months with us carotid. AW 

## 2018-09-27 ENCOUNTER — Encounter: Payer: Self-pay | Admitting: Emergency Medicine

## 2018-10-10 ENCOUNTER — Ambulatory Visit (INDEPENDENT_AMBULATORY_CARE_PROVIDER_SITE_OTHER): Payer: Medicare Other | Admitting: Psychiatry

## 2018-10-10 DIAGNOSIS — F3181 Bipolar II disorder: Secondary | ICD-10-CM | POA: Diagnosis not present

## 2018-10-10 DIAGNOSIS — I6523 Occlusion and stenosis of bilateral carotid arteries: Secondary | ICD-10-CM

## 2018-10-10 NOTE — Progress Notes (Signed)
[facility]  Patient ID: Gregory [name],  MRN: [id number]  PCP: Barbie Banner, MD  Date of Evaluation: 10/10/2018 Time spent:30 minutes   HISTORY/CURRENT STATUS: HPI CC: FU bipolar depression. Last seen in February and stopped Lithium before that. Patient reports stable mood and denies depressed or irritable moods.  Patient denies any recent difficulty with anxiety.  Patient denies difficulty with sleep initiation or maintenance. Denies appetite disturbance.  Patient reports that energy and motivation have been good.  Patient denies any difficulty with concentration.  Patient denies any suicidal ideation.  Individual Medical History/ Review of Systems: Changes? :No Aneurysm stable.   Allergies: Bupropion and Valproic acid  Current Medications:  Current Outpatient Medications:  .  ALPRAZolam (XANAX) 0.5 MG tablet, TAKE 1 TABLET DURING THE DAY, AND 2 TABLETS AT BEDTIME PRN FOR ANXIETY AND INSOMNIA., Disp: , Rfl:  .  amLODipine (NORVASC) 5 MG tablet, Take 5 mg by mouth daily., Disp: , Rfl:  .  aspirin 81 MG tablet, Take 81 mg by mouth daily., Disp: , Rfl:  .  atorvastatin (LIPITOR) 40 MG tablet, TAKE 1 TABLET BY MOUTH EVERY DAY TO LOWER CHOLESTEROL., Disp: , Rfl:  .  Cholecalciferol (D3 VITAMIN PO), Take 2,000 Int'l Units by mouth daily., Disp: , Rfl:  .  clopidogrel (PLAVIX) 75 MG tablet, Take 75 mg by mouth daily., Disp: , Rfl:  .  flecainide (TAMBOCOR) 50 MG tablet, Take 50 mg by mouth 2 (two) times daily. , Disp: , Rfl:  .  Glucosamine HCl (GLUCOSAMINE PO), Take 1,500 mg by mouth daily., Disp: , Rfl:  .  mirtazapine (REMERON) 7.5 MG tablet, Take 7.5 mg by mouth at bedtime. , Disp: , Rfl:  .  Omega-3 Fatty Acids (FISH OIL) 1200 MG CAPS, Take 1 capsule by mouth daily. , Disp: , Rfl:  .  ramipril (ALTACE) 10 MG capsule, Take 10 mg by mouth daily., Disp: , Rfl:  .  lithium carbonate 150 MG capsule, Take 150 mg by mouth every evening., Disp: , Rfl:  .  Multiple  Vitamins-Minerals (CENTRUM ADULTS PO), Take 1 tablet by mouth daily. , Disp: , Rfl:  .  Omega-3 Fatty Acids (OMEGA 3 PO), Take 500 mg by mouth daily. , Disp: , Rfl:  .  propranolol (INDERAL) 10 MG tablet, TK 1-2 TS PO BID AS NEEDED FOR TREMORS, Disp: , Rfl: 1 .  sertraline (ZOLOFT) 50 MG tablet, Take 150 mg by mouth daily. , Disp: , Rfl: 1 Medication Side Effects: None  Family Medical/ Social History: Changes? Yes Still singing on the road.  MENTAL HEALTH EXAM:  There were no vitals taken for this visit.There is no height or weight on file to calculate BMI.  General Appearance: Neat  Eye Contact:  Good  Speech:  Normal Rate  Volume:  Normal  Mood:  Euthymic  Affect:  Appropriate  Thought Process:  Goal Directed  Orientation:  Full (Time, Place, and Person)  Thought Content: Logical   Suicidal Thoughts:  No  Homicidal Thoughts:  No  Memory:  Recent  Judgement:  Good  Insight:  Fair  Psychomotor Activity:  Normal  Concentration:  Concentration: Good  Recall:  Good  Fund of Knowledge: Good  Language: Good  Akathisia:  No  AIMS (if indicated): not done  Assets:  Communication Skills Desire for Improvement Financial Resources/Insurance Housing Intimacy Leisure Time Physical Health Resilience Social Support Talents/Skills Transportation Vocational/Educational  ADL's:  Intact  Cognition: WNL  Prognosis:  Fair  DIAGNOSES:    ICD-10-CM   1. Bipolar II disorder [facility]) F31.81     RECOMMENDATIONS:   Mr. Roper has not had any mood symptoms in the last 9 months.  He has had repeated bouts of depression with occasional of hypomania some of which have lasted months.  He has not done well on mood stabilizers as they have been poorly tolerated.  We have discussed the very significant risk of mood instability mood and mood swings since he is not taking a mood stabilizer.  He is willing to take that risk at this time.  He states he will notify me if he starts having hypomania or  depression again.  He is aware of the risks that antidepressants can cause mood swings in bipolar patients but he has not been able to get off of the antidepressant without recurrence of depression.   Lauraine Rinne, MD

## 2018-12-03 ENCOUNTER — Other Ambulatory Visit: Payer: Self-pay

## 2018-12-03 MED ORDER — MIRTAZAPINE 7.5 MG PO TABS: 7.5000 mg | ORAL_TABLET | Freq: Every day | ORAL | 0 refills | Status: DC

## 2019-01-18 ENCOUNTER — Telehealth: Payer: Self-pay | Admitting: Psychiatry

## 2019-01-18 NOTE — Telephone Encounter (Signed)
Patient wants to know if Zoloft can cause tremors.  Please contact patient [phone]

## 2019-01-21 NOTE — Telephone Encounter (Signed)
Unable to reach or leave voicemail

## 2019-02-07 ENCOUNTER — Other Ambulatory Visit (HOSPITAL_COMMUNITY): Payer: Self-pay | Admitting: Interventional Radiology

## 2019-02-07 DIAGNOSIS — I771 Stricture of artery: Secondary | ICD-10-CM

## 2019-02-13 ENCOUNTER — Ambulatory Visit (HOSPITAL_COMMUNITY)
Admission: RE | Admit: 2019-02-13 | Discharge: 2019-02-13 | Disposition: A | Discharge status: Home / Self Care | Payer: Medicare Other | Source: Ambulatory Visit | Attending: Interventional Radiology | Admitting: Interventional Radiology

## 2019-02-13 DIAGNOSIS — I771 Stricture of artery: Secondary | ICD-10-CM | POA: Diagnosis present

## 2019-02-17 ENCOUNTER — Other Ambulatory Visit: Payer: Self-pay | Admitting: Psychiatry

## 2019-02-18 ENCOUNTER — Telehealth (HOSPITAL_COMMUNITY): Payer: Self-pay

## 2019-02-18 ENCOUNTER — Other Ambulatory Visit: Payer: Self-pay | Admitting: Psychiatry

## 2019-02-18 NOTE — Telephone Encounter (Signed)
Pt's wife agreed to f/u in 6 months with us carotid. AW 

## 2019-02-19 ENCOUNTER — Other Ambulatory Visit: Payer: Self-pay

## 2019-02-19 MED ORDER — MIRTAZAPINE 7.5 MG PO TABS: 7.5000 mg | ORAL_TABLET | Freq: Every day | ORAL | 0 refills | Status: DC

## 2019-05-28 ENCOUNTER — Other Ambulatory Visit: Payer: Self-pay | Admitting: Psychiatry

## 2019-06-24 ENCOUNTER — Other Ambulatory Visit: Payer: Self-pay | Admitting: Psychiatry

## 2019-07-30 ENCOUNTER — Ambulatory Visit (INDEPENDENT_AMBULATORY_CARE_PROVIDER_SITE_OTHER): Payer: Medicare Other | Admitting: Psychiatry

## 2019-07-30 ENCOUNTER — Other Ambulatory Visit: Payer: Self-pay

## 2019-07-30 ENCOUNTER — Encounter: Payer: Self-pay | Admitting: Psychiatry

## 2019-07-30 DIAGNOSIS — F3181 Bipolar II disorder: Secondary | ICD-10-CM | POA: Diagnosis not present

## 2019-07-30 DIAGNOSIS — F5105 Insomnia due to other mental disorder: Secondary | ICD-10-CM

## 2019-07-30 NOTE — Progress Notes (Signed)
Gregory GhaziClaude D Dennington [id number] 06/07/1934 85 y.o.  Subjective:   Patient ID:  Gregory GhaziClaude D [name] is a 85 y.o. (DOB 02/27/1934) male.  Chief Complaint:  Chief Complaint  Patient presents with  . Tremors  . Follow-up    psych med management    HPI Gregory GhaziClaude D [name] presents to the office today for follow-up of bipolar 2 depression and chronic insomnia with shift related sleep problems.  Last seen October 10, 2018.   Seen with wife.  Forced into Covid. Retirement.  No major complaints re: sx.  Patient reports stable mood and denies depressed or irritable moods.  Patient denies any recent difficulty with anxiety.  Patient denies difficulty with sleep initiation or maintenance.  Goes to bed until 2-3 AM and up about 11 AM.  Denies appetite disturbance.  Patient reports that energy and motivation have been good.  Patient denies any difficulty with concentration.  Patient denies any suicidal ideation.  Wife only concern is his sleep pattern.   He says he goes to sleep fine.   Past Psychiatric Medication Trials: Depakote side effects, lithium tremor, Wellbutrin side effects, duloxetine, Pristiq, sertraline, Abilify lost response, buspirone, pramipexole 0.125, lamotrigine no response, Latuda 40, Rexulti, buspirone, methylphenidate, methyl folate, olanzapine, Lunesta, Ambien, Xanax, mirtazapine, trazodone, temazepam, propranolol Under my psychiatric care since 2005 Review of Systems:  Review of Systems  Neurological: Positive for tremors. Negative for weakness.    Medications: I have reviewed the patient's current medications.  Current Outpatient Medications  Medication Sig Dispense Refill  . ALPRAZolam (XANAX) 0.5 MG tablet TAKE 1 TABLET DURING THE DAY, AND 2 TABLETS AT BEDTIME PRN FOR ANXIETY AND INSOMNIA.    Marland Kitchen. amLODipine (NORVASC) 5 MG tablet Take 5 mg by mouth daily.    Marland Kitchen. aspirin 81 MG tablet Take 81 mg by mouth daily.    Marland Kitchen. atorvastatin (LIPITOR) 40 MG tablet TAKE 1 TABLET BY MOUTH EVERY DAY TO  LOWER CHOLESTEROL.    . Cholecalciferol (D3 VITAMIN PO) Take 2,000 Int'l Units by mouth daily.    . clopidogrel (PLAVIX) 75 MG tablet Take 75 mg by mouth daily.    . flecainide (TAMBOCOR) 50 MG tablet Take 50 mg by mouth 2 (two) times daily.     . Glucosamine HCl (GLUCOSAMINE PO) Take 1,500 mg by mouth daily.    . mirtazapine (REMERON) 7.5 MG tablet TAKE 1 TABLET(7.5 MG) BY MOUTH AT BEDTIME 90 tablet 0  . Multiple Vitamins-Minerals (CENTRUM ADULTS PO) Take 1 tablet by mouth daily.     . Omega-3 Fatty Acids (FISH OIL) 1200 MG CAPS Take 1 capsule by mouth daily.     . ramipril (ALTACE) 10 MG capsule Take 10 mg by mouth daily.    . sertraline (ZOLOFT) 50 MG tablet TAKE 3 TABLETS BY MOUTH EVERY DAY 270 tablet 0  . lithium carbonate 150 MG capsule Take 150 mg by mouth every evening.    . Omega-3 Fatty Acids (OMEGA 3 PO) Take 500 mg by mouth daily.     . propranolol (INDERAL) 10 MG tablet TK 1-2 TS PO BID AS NEEDED FOR TREMORS 60 tablet 1   No current facility-administered medications for this visit.     Medication Side Effects: None  Allergies:  Allergies  Allergen Reactions  . Bupropion Other (See Comments)    Unsure about alleries  . Valproic Acid Other (See Comments)    Unsure about this    Past Medical History:  Diagnosis Date  . Abdominal aneurysm (HCC)   .  Depression   . History of kidney stones   . Hyperlipidemia   . Hypertension   . Stroke [facility]) 2004?   TIA    Family History  Problem Relation Age of Onset  . Cancer Mother        breast  . Heart disease Mother   . Heart disease Brother   . Tremor Neg Hx     Social History   Socioeconomic History  . Marital status: Married    Spouse name: Not on file  . Number of children: 2  . Years of education: Not on file  . Highest education level: Some college, no degree  Occupational History  . Not on file  Social Needs  . Financial resource strain: Not on file  . Food insecurity    Worry: Not on file     Inability: Not on file  . Transportation needs    Medical: Not on file    Non-medical: Not on file  Tobacco Use  . Smoking status: Former Research scientist (life sciences)  . Smokeless tobacco: Never Used  Substance and Sexual Activity  . Alcohol use: No  . Drug use: No  . Sexual activity: Not on file  Lifestyle  . Physical activity    Days per week: Not on file    Minutes per session: Not on file  . Stress: Not on file  Relationships  . Social Herbalist on phone: Not on file    Gets together: Not on file    Attends religious service: Not on file    Active member of club or organization: Not on file    Attends meetings of clubs or organizations: Not on file    Relationship status: Not on file  . Intimate partner violence    Fear of current or ex partner: Not on file    Emotionally abused: Not on file    Physically abused: Not on file    Forced sexual activity: Not on file  Other Topics Concern  . Not on file  Social History Narrative   Lives at home w/ his wife   Right-handed   Caffeine: occasional coffee and tea, decaf tea at home    Past Medical History, Surgical history, Social history, and Family history were reviewed and updated as appropriate.   Please see review of systems for further details on the patient's review from today.   Objective:   Physical Exam:  There were no vitals taken for this visit.  Physical Exam Constitutional:      General: He is not in acute distress.    Appearance: He is well-developed.  Musculoskeletal:        General: No deformity.  Neurological:     Mental Status: He is alert and oriented to person, place, and time.     Coordination: Coordination normal.  Psychiatric:        Attention and Perception: Attention and perception normal. He does not perceive auditory or visual hallucinations.        Mood and Affect: Mood normal. Mood is not anxious or depressed. Affect is not labile, blunt, angry or inappropriate.        Speech: Speech normal.         Behavior: Behavior normal.        Thought Content: Thought content normal. Thought content is not paranoid or delusional. Thought content does not include homicidal or suicidal ideation. Thought content does not include homicidal or suicidal plan.  Cognition and Memory: Cognition and memory normal.        Judgment: Judgment normal.     Comments: Insight intact Good humor.  Mild forgetfulness.  Mild word finding issues     Lab Review:     Component Value Date/Time   NA 139 02/27/2018 1542   NA 142 01/02/2018 1456   K 4.1 02/27/2018 1542   CL 102 02/27/2018 1542   CO2 28 02/27/2018 1542   GLUCOSE 81 02/27/2018 1542   BUN 16 02/27/2018 1542   BUN 11 01/02/2018 1456   CREATININE 1.09 02/27/2018 1542   CALCIUM 9.7 02/27/2018 1542   PROT 7.0 02/27/2018 1542   ALBUMIN 4.1 02/27/2018 1542   AST 29 02/27/2018 1542   ALT 25 02/27/2018 1542   ALKPHOS 64 02/27/2018 1542   BILITOT 1.0 02/27/2018 1542   GFRNONAA >60 02/27/2018 1542   GFRAA >60 02/27/2018 1542       Component Value Date/Time   WBC 7.9 02/27/2018 1542   RBC 4.12 (L) 02/27/2018 1542   HGB 13.3 02/27/2018 1542   HCT 40.2 02/27/2018 1542   PLT 205 02/27/2018 1542   MCV 97.6 02/27/2018 1542   MCH 32.3 02/27/2018 1542   MCHC 33.1 02/27/2018 1542   RDW 12.5 02/27/2018 1542   LYMPHSABS 1.8 02/27/2018 1542   MONOABS 0.7 02/27/2018 1542   EOSABS 0.2 02/27/2018 1542   BASOSABS 0.0 02/27/2018 1542    No results found for: POCLITH, LITHIUM   No results found for: PHENYTOIN, PHENOBARB, VALPROATE, CBMZ   .res Assessment: Plan:    Gregory [name] was seen today for tremors and follow-up.  Diagnoses and all orders for this visit:  Bipolar II disorder (HCC)  Insomnia due to mental condition     Greater than 50% of face to face time with patient was spent on counseling and coordination of care. We discussed Mr. Alwyn RenHopper has not had any mood symptoms in the past several months.  He has had repeated bouts of depression  with occasional of hypomania some of which have lasted months.  He has not done well on mood stabilizers as they have been poorly tolerated.  We have discussed the very significant risk of mood instability mood and mood swings since he is not taking a mood stabilizer.  He is willing to take that risk at this time.  He states he will notify me if he starts having hypomania or depression again.  He is aware of the risks that antidepressants can cause mood swings in bipolar patients but he has not been able to get off of the antidepressant without recurrence of depression.  Unfortunately he does not tend to recognize his hypomania very well but the family does recognize it.  He and his wife agree that he remained stable.  He has a delayed sleep phase but it is not associated with any significant disability.  There is significant concern that he has had a hypomanic episodes in the past some of which have been reasonably prolonged and he is not on a mood stabilizer.  This has been discussed with he and his wife extensively.  Unfortunately he has not tolerated any of the mood stabilizers that he has taken.  They are aware of the risk of future hypomania.  Fortunately he has not had severe manic episodes but his hypomanic episodes have been to disruptive at times.  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  His wife identifies no unusual behaviors.  He has not been able to stop antidepressants without depression recurring and has no interest in stopping antidepressants because of this.  The disability has been far worse from depression in the past and it has been from hypomania.  No med changes indicated.  Follow-up 4 to 6 months or sooner as needed  This appt was 30 mins.   Meredith Staggersarey Cottle MD, DFAPA  Please see After Visit Summary for patient specific instructions.  No future appointments.  No orders of the defined types were placed in this  encounter.   -------------------------------

## 2019-07-31 ENCOUNTER — Other Ambulatory Visit: Payer: Self-pay

## 2019-07-31 ENCOUNTER — Telehealth: Payer: Self-pay | Admitting: Psychiatry

## 2019-07-31 MED ORDER — PROPRANOLOL HCL 10 MG PO TABS: ORAL_TABLET | ORAL | 1 refills | Status: DC

## 2019-07-31 NOTE — Telephone Encounter (Signed)
Looked through medication list and he was prescribed Propranolol in 2018 for tremors. Submitted a refill to [facility]

## 2019-07-31 NOTE — Telephone Encounter (Signed)
Pt at Pharmacy waiting on medication refill for his tremors.

## 2019-08-01 ENCOUNTER — Encounter: Payer: Self-pay | Admitting: Psychiatry

## 2019-08-21 ENCOUNTER — Other Ambulatory Visit: Payer: Self-pay | Admitting: Psychiatry

## 2019-09-05 ENCOUNTER — Other Ambulatory Visit (HOSPITAL_COMMUNITY): Payer: Self-pay | Admitting: Interventional Radiology

## 2019-09-05 DIAGNOSIS — I771 Stricture of artery: Secondary | ICD-10-CM

## 2019-09-12 ENCOUNTER — Other Ambulatory Visit: Payer: Self-pay

## 2019-09-12 ENCOUNTER — Ambulatory Visit (HOSPITAL_COMMUNITY)
Admission: RE | Admit: 2019-09-12 | Discharge: 2019-09-12 | Disposition: A | Discharge status: Home / Self Care | Payer: Medicare Other | Source: Ambulatory Visit | Attending: Interventional Radiology | Admitting: Interventional Radiology

## 2019-09-12 DIAGNOSIS — I771 Stricture of artery: Secondary | ICD-10-CM | POA: Diagnosis present

## 2019-09-12 NOTE — Progress Notes (Signed)
VASCULAR LAB PRELIMINARY  PRELIMINARY  PRELIMINARY  PRELIMINARY  Carotid duplex completed.    Preliminary report:  See CV proc for preliminary results.   Carle Fenech, RVT 09/12/2019, 2:53 PM

## 2019-09-17 ENCOUNTER — Other Ambulatory Visit: Payer: Self-pay | Admitting: Psychiatry

## 2019-09-18 ENCOUNTER — Telehealth (HOSPITAL_COMMUNITY): Payer: Self-pay

## 2019-09-18 NOTE — Telephone Encounter (Signed)
Called pt regarding recent us carotid, no answer, no vm. AW 

## 2019-09-24 ENCOUNTER — Other Ambulatory Visit: Payer: Self-pay

## 2019-09-24 DIAGNOSIS — Z20822 Contact with and (suspected) exposure to covid-19: Secondary | ICD-10-CM

## 2019-09-26 LAB — NOVEL CORONAVIRUS, NAA: SARS-CoV-2, NAA: NOT DETECTED

## 2019-09-30 ENCOUNTER — Telehealth (HOSPITAL_COMMUNITY): Payer: Self-pay

## 2019-09-30 NOTE — Telephone Encounter (Signed)
Pt agreed to f/u in 6 months with us carotid. AW 

## 2019-11-08 ENCOUNTER — Other Ambulatory Visit: Payer: Self-pay | Admitting: Psychiatry

## 2019-11-13 ENCOUNTER — Other Ambulatory Visit: Payer: Self-pay | Admitting: Psychiatry

## 2019-12-03 ENCOUNTER — Other Ambulatory Visit: Payer: Self-pay | Admitting: Psychiatry

## 2020-02-20 IMAGING — XA IR VERTEBRAL  NON-SELECT UNILAT  RIGHT(MS)
1 series · 3 of 3 positions shown · IV contrast (IODINE)
Comparison: CT angiogram of the head and neck 01/24/2018.

CLINICAL DATA: History of dizziness, questionable expressive speech
difficulties.

EXAM:
BILATERAL COMMON CAROTID AND INNOMINATE ANGIOGRAPHY; IR ANGIO
VERTEBRAL SEL SUBCLAVIAN INNOMINATE UNI RIGHT MOD SED
TECHNIQUE: Informed written consent was obtained from the patient after a
thorough discussion of the procedural risks, benefits and
alternatives. All questions were addressed. Maximal Sterile Barrier
Technique was utilized including caps, mask, sterile gowns, sterile
gloves, sterile drape, hand hygiene and skin antiseptic. A timeout
was performed prior to the initiation of the procedure.

[Series 4: cerebral · 3 of 3 slices shown]
[im 1/3]
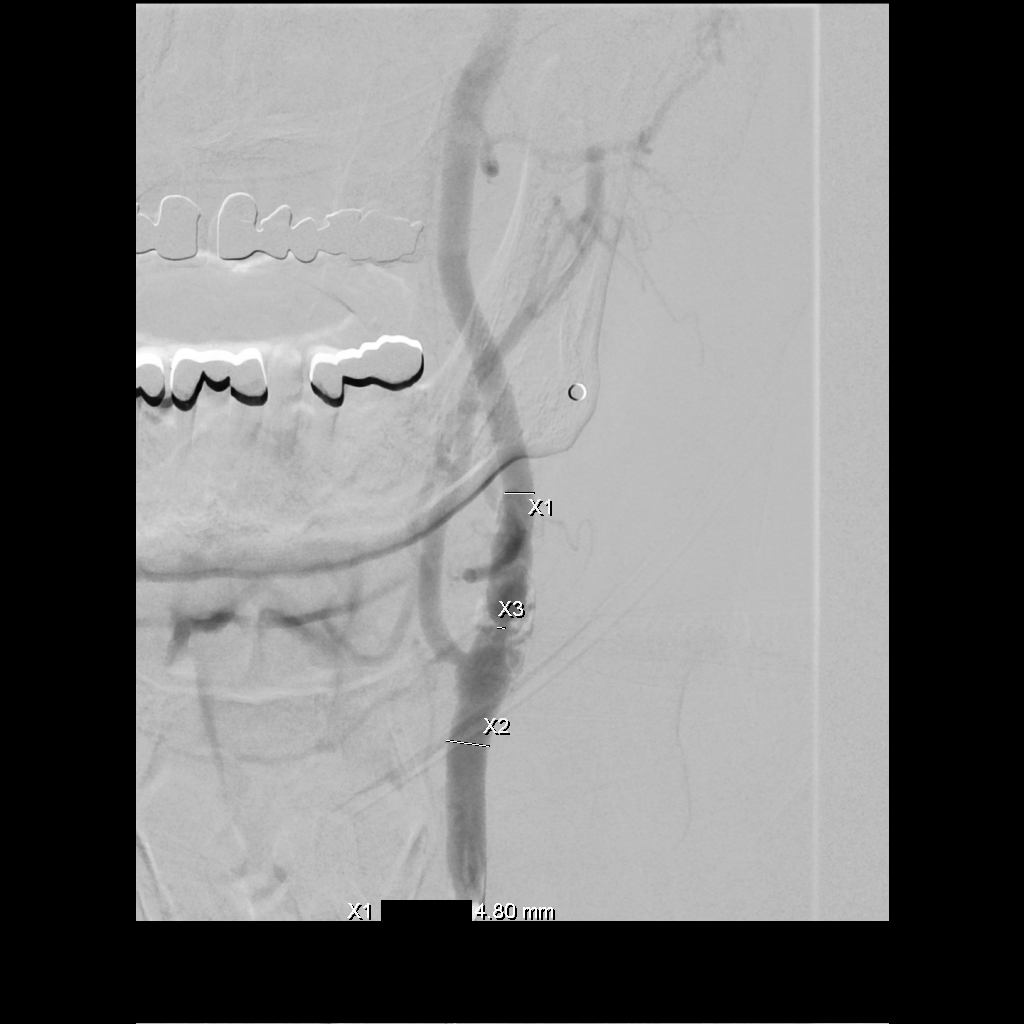
[im 2/3]
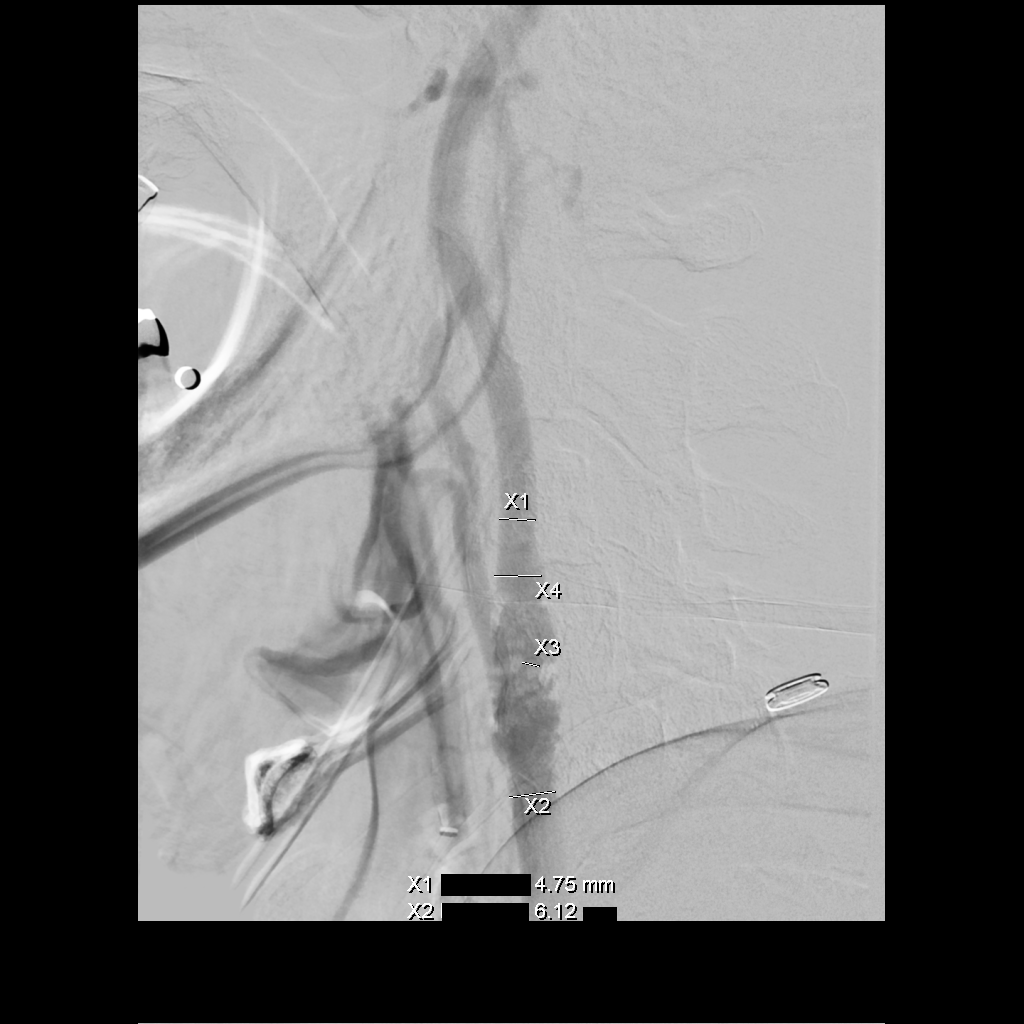
[im 3/3]
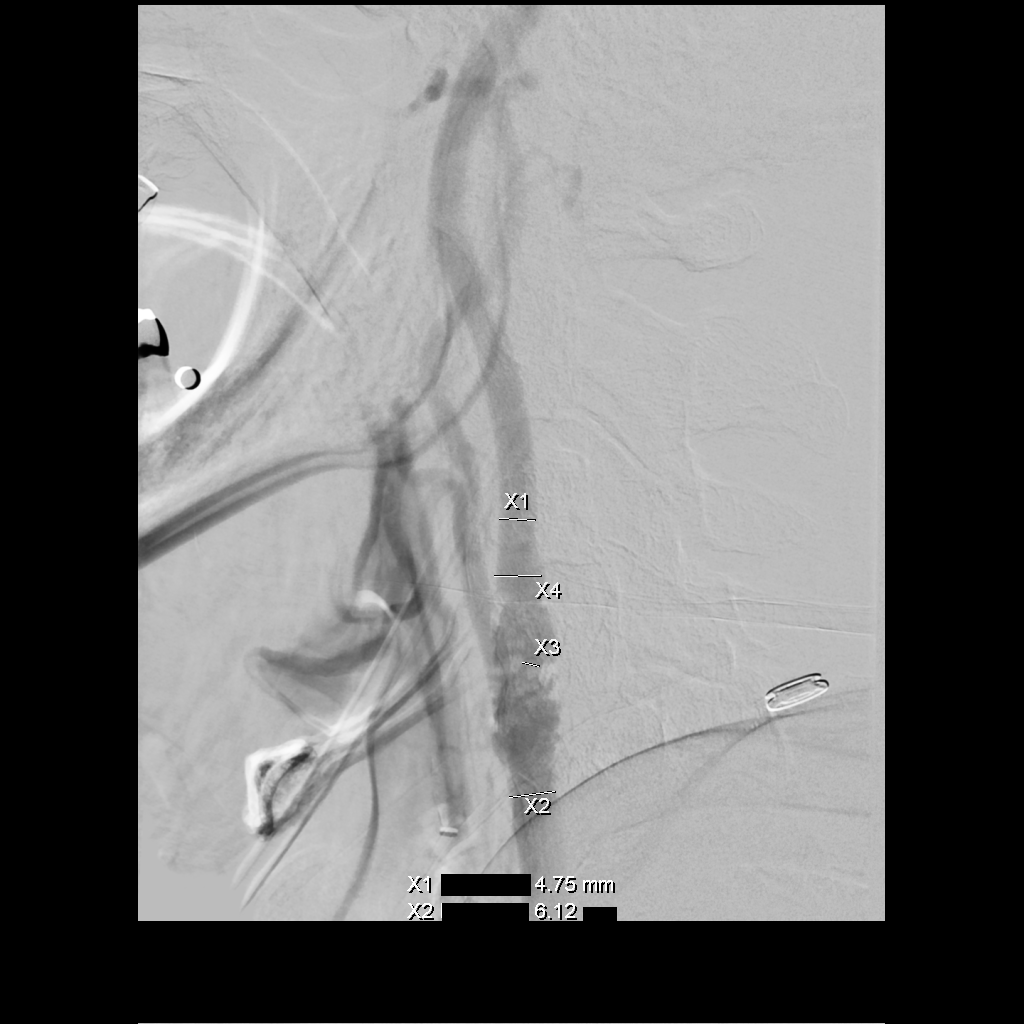

[3 of 3 positions shown; findings below may reference images not displayed]

MEDICATIONS:
Heparin 6444 units IV; no antibiotic was administered within 1 hour
of the procedure.

ANESTHESIA/SEDATION:
Mac anesthesia as per the [REDACTED] at [REDACTED].

CONTRAST:  Isovue 300 65 mL.

FLUOROSCOPY TIME:  Fluoroscopy Time: 11 minutes 24 seconds (568
mGy).

COMPLICATIONS:
None immediate.
The right groin was prepped and draped in the usual sterile fashion.
Thereafter using modified Seldinger technique, transfemoral access
into the right common femoral artery was obtained without
difficulty. Over a 0.035 inch guidewire, a 5 French Pinnacle sheath
was inserted. Through this, and also over 0.035 inch guidewire, a 5
French JB 1 catheter was advanced to the aortic arch region and
selectively positioned in the right common carotid artery, the
innominate artery, the left common carotid artery.
FINDINGS: The innominate artery injection demonstrates the origin of the right
common carotid artery and the right subclavian artery to be widely
patent.

The right vertebral artery origin demonstrates mild stenosis.

The vessel is, otherwise, seen to opacify to the cranial skull base
to opacify the right vertebrobasilar junction, and the right
posterior-inferior cerebellar artery. There is a moderate 50%
stenosis of the mid basilar artery at the level of the left
anterior-inferior cerebellar artery.

Distal to this another focal area of narrowing is noted in the
distal basilar artery of about 50% proximal to the origins of the
posterior cerebral arteries.

The delayed arterial phase and capillary phase are, otherwise,
grossly intact. Unopacified blood is noted in the proximal basilar
artery from the contralateral vertebral artery.

The right common carotid arteriogram demonstrates the right external
carotid artery and its major branches to be widely patent.

The right internal carotid artery at the bulb demonstrates mild
caliber irregularity secondary to a smooth plaque along the
posterior wall.

No significant stenosis by the NASCET criteria is seen.

More distally, the right internal carotid artery is seen to opacify
to the cranial skull base. Wide patency is seen of the petrous, the
cavernous and the supraclinoid segments.

The visualized right middle cerebral artery and the right anterior
cerebral artery demonstrate wide patency with a focal area of mild
to moderate stenosis of the right middle cerebral artery M1 segment.

The left common carotid arteriogram demonstrates the left external
carotid artery and its major branches to be widely patent.

The left internal carotid artery at the bulb and just distally
demonstrates complex irregular atherosclerotic plaque associated
with a 71% stenosis in the AP projection and approximately 65%
stenosis on the lateral projection.

More distally, the left internal carotid artery is seen to opacify
to the cranial skull base.

The petrous, the cavernous and the supraclinoid segments demonstrate
wide patency.

A dominant left posterior communicating artery is seen opacifying
the left posterior cerebral artery distribution.

The left middle cerebral artery and the left anterior cerebral
artery opacify into the capillary and venous phases.

Mild focal areas of caliber irregularity with narrowing are seen in
the distal left M1 segment and the proximal left anterior cerebral
artery A1 segment.
IMPRESSION: Approximately 65% to 71% stenosis of the left internal carotid
artery at the bulb secondary to an irregular atherosclerotic plaque.

Approximately 50% stenosis of the mid basilar artery, and also the
distal basilar artery as described above.

PLAN:
Angiographic findings were reviewed with the patient and the family.

The patient has been on aspirin and Plavix for just a couple of
weeks. In the interim has had no symptoms. Also the MRI scan of the
brain revealed no evidence of ischemic changes in the left cerebral
hemisphere.

We will therefore repeat ultrasound of the carotids in about 3
months time. Should there be a change for worsening stenosis in the
left internal carotid artery, endovascular revascularization with
stent assisted angioplasty and distal protection may be warranted.
Similarly should the patient become symptomatic despite being on
aspirin and Plavix in the interim, endovascular option would be
considered at that time. Questions were answered to their
satisfaction.

The family and the patient leave with good understanding and
agreement with the above management plan.

## 2020-02-26 ENCOUNTER — Other Ambulatory Visit: Payer: Self-pay | Admitting: Psychiatry

## 2020-03-16 ENCOUNTER — Telehealth (HOSPITAL_COMMUNITY): Payer: Self-pay

## 2020-03-16 NOTE — Telephone Encounter (Signed)
Called to schedule US carotid. Pt is out of town at the moment. Wife will call back to schedule. AW

## 2020-03-17 ENCOUNTER — Encounter: Payer: Self-pay | Admitting: Neurology

## 2020-03-26 ENCOUNTER — Other Ambulatory Visit (HOSPITAL_COMMUNITY): Payer: Self-pay | Admitting: Interventional Radiology

## 2020-03-26 DIAGNOSIS — I771 Stricture of artery: Secondary | ICD-10-CM

## 2020-04-01 ENCOUNTER — Ambulatory Visit (HOSPITAL_COMMUNITY)
Admission: RE | Admit: 2020-04-01 | Discharge: 2020-04-01 | Disposition: A | Discharge status: Home / Self Care | Payer: Medicare Other | Source: Ambulatory Visit | Attending: Interventional Radiology | Admitting: Interventional Radiology

## 2020-04-01 ENCOUNTER — Other Ambulatory Visit: Payer: Self-pay

## 2020-04-01 DIAGNOSIS — I771 Stricture of artery: Secondary | ICD-10-CM | POA: Insufficient documentation

## 2020-04-01 NOTE — Progress Notes (Signed)
Carotid artery duplex completed. Refer to "CV Proc" under chart review to view preliminary results.  04/01/2020 2:31 PM Eula Fried., MHA, RVT, RDCS, RDMS

## 2020-04-07 ENCOUNTER — Telehealth (HOSPITAL_COMMUNITY): Payer: Self-pay

## 2020-04-07 NOTE — Telephone Encounter (Signed)
Pt agreed to f/u in 6 months with us carotid. AW 

## 2020-04-20 NOTE — Progress Notes (Signed)
Assessment/Plan:   1.  Tremor  -This was likely due to the lithium.  Studies are varied, but incidate that up to 2/3 of patients on lithium will have lithium-induced tremor, even if the patients are not toxic on the medication.  This is just a common side effect of patients on lithium. 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  I really did not recommend that we treat that.  He has propranolol at home for as needed use.  He and his wife are agreeable that he did not need treatment.  2.  Hand paresthesias  -Sounded fairly classic for carpal tunnel.  He is waking up with the symptoms.  He really did not want much done.  Gave him a prescription for a cock-up wrist splint.  Asked him to wear it at least nightly.  If that does not help, he will call me back and we can schedule an EMG.  3.  Follow-up as needed    Subjective:   Gregory [name] was seen in consultation in the movement disorder clinic at the request of [name], MD.  The evaluation is for tremor.  Outside records that were made available to me were reviewed, including psychiatry records from Dr. Clovis Pu.  This patient is accompanied in the office by his spouse who supplements the history.  Pt last saw Dr. Clovis Pu on 07/30/19.  Pt is on lithium from Dr. Clovis Pu for bipolar II and tremor addressed last visit, 07/30/19.  Pt states that he has been off of it for a year.  Tremor is much improved.  pts wife state that abilify also caused tremor.  Pts wife reports that no longer has tremor but pt states that he has trouble signing his name because of tremor.  Also states that pointer and ring finger on the L hand are numb, esp on awakening but will happen some in the R hand as well.  "I wonder if it is related."  Affected by caffeine:  No. (doesn't think so) Affected by alcohol:  Doesn't drink EtOH Affected by fatigue:  No. - "I don't get tired" Spills soup if on spoon:  No. Spills glass of liquid if full:   No. Affects ADL's (tying shoes, brushing teeth, etc):  No.  Has trouble buttoning clothing since a stroke 3 years ago and another 15 years ago  - only thing it left him with is fine motor coordination trouble  Current/Previously tried tremor medications: on inderal 10 mg, 1-2 tabs prn tremor from psychiatry (pt no longer taking)  Current medications that may exacerbate tremor:  lithium (now off)  Past Psychiatric Medication Trials(list from Dr. Clovis Pu): Depakote side effects, lithium tremor, Wellbutrin side effects, duloxetine, Pristiq, sertraline, Abilify lost response, buspirone, pramipexole 0.125, lamotrigine no response, Latuda 40, Rexulti, buspirone, methylphenidate, methyl folate, olanzapine, Lunesta, Ambien, Xanax, mirtazapine, trazodone, temazepam, propranolol   Allergies  Allergen Reactions  . Bupropion Other (See Comments)    Unsure about alleries  . Valproic Acid Other (See Comments)    Unsure about this    Current Outpatient Medications  Medication Instructions  . ALPRAZolam (XANAX) 0.5 MG tablet Pt takes 1 tab qhs  . amLODipine (NORVASC) 5 mg, Oral, Daily  . aspirin 81 mg, Oral, Daily  . atorvastatin (LIPITOR) 40 MG tablet TAKE 1 TABLET BY MOUTH EVERY DAY TO LOWER CHOLESTEROL.  . Cholecalciferol (D3 VITAMIN PO) 2,000 Int'l Units, Oral, Daily  . clopidogrel (PLAVIX) 75 mg, Oral, Daily  .  flecainide (TAMBOCOR) 50 mg, Oral, 2 times daily  . Glucosamine HCl (GLUCOSAMINE PO) 1,500 mg, Oral, Daily  . mirtazapine (REMERON) 7.5 MG tablet TAKE 1 TABLET(7.5 MG) BY MOUTH AT BEDTIME  . Multiple Vitamins-Minerals (CENTRUM ADULTS PO) 1 tablet, Oral, Daily  . Omega-3 Fatty Acids (FISH OIL) 1200 MG CAPS 1 capsule, Oral, Daily  . propranolol (INDERAL) 10 MG tablet TK 1-2 TS PO BID AS NEEDED FOR TREMORS  . ramipril (ALTACE) 10 mg, Oral, Daily  . sertraline (ZOLOFT) 50 MG tablet TAKE 3 TABLETS BY MOUTH EVERY DAY     Objective:   VITALS:   Vitals:   04/21/20 1341  BP: 105/66    Pulse: (!) 58  SpO2: 95%  Weight: 196 lb (88.9 kg)  Height: 6' (1.829 m)   Gen:  Appears stated age and in NAD. HEENT:  Normocephalic, atraumatic. The mucous membranes are moist. The superficial temporal arteries are without ropiness or tenderness. Cardiovascular: Bradycardic.  Regular Lungs: Clear to auscultation bilaterally. Neck: There are no carotid bruits noted bilaterally.  NEUROLOGICAL:  Orientation:  The patient is alert and oriented x 3.  Does have some trouble staying focused/on topic Cranial nerves: There is good facial symmetry. Extraocular muscles are intact and visual fields are full to confrontational testing. Speech is fluent and clear. Soft palate rises symmetrically and there is no tongue deviation. Hearing is intact to conversational tone. Tone: Tone is good throughout. Sensation: Sensation is intact to light touch touch throughout (facial, trunk, extremities). Vibration is intact at the bilateral big toe. There is no extinction with double simultaneous stimulation. There is no sensory dermatomal level identified. Coordination:  The patient has no dysdiadichokinesia or dysmetria. Motor: Strength is 5/5 in the bilateral upper and lower extremities.  Shoulder shrug is equal bilaterally.  There is no pronator drift.  There are no fasciculations noted. DTR's: Deep tendon reflexes are 2/4 at the bilateral biceps, triceps, brachioradialis, patella and achilles.  Plantar responses are downgoing bilaterally. Gait and Station: The patient is able to ambulate without difficulty.   MOVEMENT EXAM: Tremor:  There is no rest tremor.  No postural tremor.  Very minimal intention tremor.  No trouble with Archimedes spirals.  Very minimal tremor when pouring water from one glass to another.  He does not spill it.  I have reviewed and interpreted the following labs independently   Chemistry      Component Value Date/Time   NA 139 02/27/2018 1542   NA 142 01/02/2018 1456   K 4.1  02/27/2018 1542   CL 102 02/27/2018 1542   CO2 28 02/27/2018 1542   BUN 16 02/27/2018 1542   BUN 11 01/02/2018 1456   CREATININE 1.09 02/27/2018 1542      Component Value Date/Time   CALCIUM 9.7 02/27/2018 1542   ALKPHOS 64 02/27/2018 1542   AST 29 02/27/2018 1542   ALT 25 02/27/2018 1542   BILITOT 1.0 02/27/2018 1542      Lab Results  Component Value Date   WBC 7.9 02/27/2018   HGB 13.3 02/27/2018   HCT 40.2 02/27/2018   MCV 97.6 02/27/2018   PLT 205 02/27/2018   Lab Results  Component Value Date   TSH 1.840 06/14/2017      Total time spent on today's visit was 45 minutes, including both face-to-face time and nonface-to-face time.  Time included that spent on review of records (prior notes available to me/labs/imaging if pertinent), discussing treatment and goals, answering patient's questions and coordinating care.  CC:  [name], MD

## 2020-04-21 ENCOUNTER — Encounter: Payer: Self-pay | Admitting: Neurology

## 2020-04-21 ENCOUNTER — Ambulatory Visit (INDEPENDENT_AMBULATORY_CARE_PROVIDER_SITE_OTHER): Payer: Medicare Other | Admitting: Neurology

## 2020-04-21 ENCOUNTER — Other Ambulatory Visit: Payer: Self-pay

## 2020-04-21 VITALS — BP 105/66 | HR 58 | Ht 72.0 in | Wt 196.0 lb

## 2020-04-21 DIAGNOSIS — G5603 Carpal tunnel syndrome, bilateral upper limbs: Secondary | ICD-10-CM

## 2020-04-21 DIAGNOSIS — G251 Drug-induced tremor: Secondary | ICD-10-CM | POA: Diagnosis not present

## 2020-04-21 DIAGNOSIS — G250 Essential tremor: Secondary | ICD-10-CM

## 2020-04-21 MED ORDER — AMBULATORY NON FORMULARY MEDICATION: 0 refills | Status: DC

## 2020-04-21 NOTE — Patient Instructions (Signed)
You can take the RX and get the wrist splint for your numb hands and wear at least nightly.  You will get this at a medical supply store like [facility] or [facility], which is now called [facility]  The physicians and staff at [facility] are committed to providing excellent care. You may receive a survey requesting feedback about your experience at our office. We strive to receive "very good" responses to the survey questions. If you feel that your experience would prevent you from giving the office a "very good " response, please contact our office to try to remedy the situation. We may be reached at [phone]. Thank you for taking the time out of your busy day to complete the survey.

## 2020-05-15 ENCOUNTER — Other Ambulatory Visit: Payer: Self-pay | Admitting: Psychiatry

## 2020-05-15 NOTE — Telephone Encounter (Signed)
Was due back in December

## 2020-05-27 ENCOUNTER — Other Ambulatory Visit: Payer: Self-pay | Admitting: Psychiatry

## 2020-06-12 ENCOUNTER — Other Ambulatory Visit: Payer: Self-pay | Admitting: Psychiatry

## 2020-06-26 ENCOUNTER — Telehealth: Payer: Self-pay | Admitting: Psychiatry

## 2020-06-26 NOTE — Telephone Encounter (Signed)
The only thing he's taking from me is sertraline and mirtazapine and changes in either of those should not cause confusion.  I assume he's not overtaking the Xanax he takes for sleep bc that can cause those problems especially in the morning.  I'm not prescribing that as I recall. I'm concerned he may have had another stroke.  They need to get him to the ER ASAP to be sure.

## 2020-06-26 NOTE — Telephone Encounter (Signed)
Gregory [name], spouse, called to report that Gregory [name] decreased/changed how he is taking is medication.  She is not sure when he did this, but she now seeing signs that the change is affecting him.  He is becoming confused.  Last 2 nights couldn't mae out he words.  He is traveling with his son and has done some little parts in their program, but it hasn't good the last 2 nights.  He is repeating questions.  Sleeping a bit later in the mornings.  He claims he is fine: Not depressed.  But Gregory [name] is seeing him decline slightly and would you to talk with him about how he is taking his medications and the affects changes could cause.  Has appt 8/19 but doesn't want to wait that long for someone to talk with him.  His cell is [phone].

## 2020-08-06 ENCOUNTER — Other Ambulatory Visit: Payer: Self-pay

## 2020-08-06 ENCOUNTER — Encounter: Payer: Self-pay | Admitting: Psychiatry

## 2020-08-06 ENCOUNTER — Ambulatory Visit (INDEPENDENT_AMBULATORY_CARE_PROVIDER_SITE_OTHER): Payer: Medicare Other | Admitting: Psychiatry

## 2020-08-06 DIAGNOSIS — F3181 Bipolar II disorder: Secondary | ICD-10-CM | POA: Diagnosis not present

## 2020-08-06 DIAGNOSIS — F5105 Insomnia due to other mental disorder: Secondary | ICD-10-CM | POA: Diagnosis not present

## 2020-08-06 MED ORDER — SERTRALINE HCL 50 MG PO TABS: 150.0000 mg | ORAL_TABLET | Freq: Every day | ORAL | 3 refills | Status: DC

## 2020-08-06 MED ORDER — MIRTAZAPINE 7.5 MG PO TABS: 7.5000 mg | ORAL_TABLET | Freq: Every day | ORAL | 1 refills | Status: DC

## 2020-08-06 NOTE — Progress Notes (Signed)
Gregory [name] [id number] Jun 25, 1934 86 y.o.  Subjective:   Patient ID:  Gregory [name] is a 86 y.o. (DOB 1934/03/08) male.  Chief Complaint:  Chief Complaint  Patient presents with  . Follow-up    depression and cognition    HPI Gregory [name] presents to the office today for follow-up of bipolar 2 depression and chronic insomnia with shift related sleep problems.  Last seen 07/30/2019 with the folloowing noted:  Seen with wife.  Forced into Covid. Retirement.  No major complaints re: sx.  Patient reports stable mood and denies depressed or irritable moods.  Patient denies any recent difficulty with anxiety.  Patient denies difficulty with sleep initiation or maintenance.  Goes to bed until 2-3 AM and up about 11 AM.  Denies appetite disturbance.  Patient reports that energy and motivation have been good.  Patient denies any difficulty with concentration.  Patient denies any suicidal ideation. Wife only concern is his sleep pattern.   He says he goes to sleep fine.  Plan no med changes  06/26/20 [name], spouse, called to report that Gregory [name] decreased/changed how he is taking is medication.  She is not sure when he did this, but she now seeing signs that the change is affecting him.  He is becoming confused.  Last 2 nights couldn't mae out he words.  He is traveling with his son and has done some little parts in their program, but it hasn't good the last 2 nights.  He is repeating questions.  Sleeping a bit later in the mornings.  He claims he is fine: Not depressed.  But Gregory [name] is seeing him decline slightly and would you to talk with him about how he is taking his medications and the affects changes could cause.  Has appt 8/19 but doesn't want to wait that long for someone to talk with him.  His cell is [phone].   MD response:The only thing he's taking from me is sertraline and mirtazapine and changes in either of those should not cause confusion.  I assume he's not overtaking the  Xanax he takes for sleep bc that can cause those problems especially in the morning.  I'm not prescribing that as I recall. I'm concerned he may have had another stroke.  They need to get him to the ER ASAP to be sure.  08/06/20 appt with the following noted: As far as I'm concerned I'm good.  Not depressed.  Concerned about world situation.   Had cut down sertraline from 150 to 100 mg and sons said he got confused but he didn't notice it.  That is cleared up now.  She says he's back to normal. Cut down on alprazolam at night to 0.5 mg HS.  And still taking mirtazapine 7.5 mg HS.  Still delayed sleep cycle.  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  Tired of doing nothing.     Past Psychiatric Medication Trials: Depakote side effects, lithium tremor, Wellbutrin side effects, duloxetine, Pristiq, sertraline, Abilify lost response, buspirone, pramipexole 0.125, lamotrigine no response, Latuda 40, Rexulti, buspirone, methylphenidate, methyl folate, olanzapine, Lunesta, Ambien, Xanax, mirtazapine, trazodone, temazepam, propranolol Under my psychiatric care since 2005  Review of Systems:  Review of Systems  Cardiovascular: Negative for chest pain and palpitations.  Neurological: Positive for tremors. Negative for dizziness and weakness.    Medications: I have reviewed the patient's current medications.  Current Outpatient Medications  Medication Sig Dispense Refill  . ALPRAZolam (XANAX) 0.5 MG tablet Pt takes 1 tab qhs    .  AMBULATORY NON FORMULARY MEDICATION Cock up wrist splint: right and left Dx: carpal tunnel syndrome, G56.0 1 Device 0  . amLODipine (NORVASC) 5 MG tablet Take 5 mg by mouth daily.    Marland Kitchen. aspirin 81 MG tablet Take 81 mg by mouth daily.    Marland Kitchen. atorvastatin (LIPITOR) 40 MG tablet TAKE 1 TABLET BY MOUTH EVERY DAY TO LOWER CHOLESTEROL.    . Cholecalciferol (D3 VITAMIN PO) Take 2,000 Int'l Units by mouth daily.    . clopidogrel (PLAVIX) 75 MG tablet Take 75 mg by mouth daily.    .  flecainide (TAMBOCOR) 50 MG tablet Take 50 mg by mouth 2 (two) times daily.     . Glucosamine HCl (GLUCOSAMINE PO) Take 1,500 mg by mouth daily.    . mirtazapine (REMERON) 7.5 MG tablet Take 1 tablet (7.5 mg total) by mouth at bedtime. 90 tablet 1  . Multiple Vitamins-Minerals (CENTRUM ADULTS PO) Take 1 tablet by mouth daily.     . Omega-3 Fatty Acids (FISH OIL) 1200 MG CAPS Take 1 capsule by mouth daily.     . propranolol (INDERAL) 10 MG tablet TK 1-2 TS PO BID AS NEEDED FOR TREMORS 60 tablet 1  . ramipril (ALTACE) 10 MG capsule Take 10 mg by mouth daily.    . sertraline (ZOLOFT) 50 MG tablet Take 3 tablets (150 mg total) by mouth daily. 90 tablet 3   No current facility-administered medications for this visit.    Medication Side Effects: None  Allergies:  Allergies  Allergen Reactions  . Bupropion Other (See Comments)    Unsure about alleries  . Valproic Acid Other (See Comments)    Unsure about this    Past Medical History:  Diagnosis Date  . Abdominal aneurysm (HCC)   . Depression   . History of kidney stones   . Hyperlipidemia   . Hypertension   . Stroke [facility](HCC) 2004?   TIA    Family History  Problem Relation Age of Onset  . Cancer Mother        breast  . Heart disease Mother   . Heart disease Brother   . Tremor Neg Hx     Social History   Socioeconomic History  . Marital status: Married    Spouse name: Not on file  . Number of children: 2  . Years of education: Not on file  . Highest education level: Some college, no degree  Occupational History  . Not on file  Tobacco Use  . Smoking status: Former Games developermoker  . Smokeless tobacco: Never Used  Vaping Use  . Vaping Use: Never used  Substance and Sexual Activity  . Alcohol use: No  . Drug use: No  . Sexual activity: Not on file  Other Topics Concern  . Not on file  Social History Narrative   Lives at home w/ his wife   Right-handed   Caffeine: occasional coffee and tea, decaf tea at home   Social  Determinants of Health   Financial Resource Strain:   . Difficulty of Paying Living Expenses: Not on file  Food Insecurity:   . Worried About Programme researcher, broadcasting/film/videounning Out of Food in the Last Year: Not on file  . Ran Out of Food in the Last Year: Not on file  Transportation Needs:   . Lack of Transportation (Medical): Not on file  . Lack of Transportation (Non-Medical): Not on file  Physical Activity:   . Days of Exercise per Week: Not on file  . Minutes of Exercise  per Session: Not on file  Stress:   . Feeling of Stress : Not on file  Social Connections:   . Frequency of Communication with Friends and Family: Not on file  . Frequency of Social Gatherings with Friends and Family: Not on file  . Attends Religious Services: Not on file  . Active Member of Clubs or Organizations: Not on file  . Attends Banker Meetings: Not on file  . Marital Status: Not on file  Intimate Partner Violence:   . Fear of Current or Ex-Partner: Not on file  . Emotionally Abused: Not on file  . Physically Abused: Not on file  . Sexually Abused: Not on file    Past Medical History, Surgical history, Social history, and Family history were reviewed and updated as appropriate.   Please see review of systems for further details on the patient's review from today.   Objective:   Physical Exam:  There were no vitals taken for this visit.  Physical Exam Constitutional:      General: He is not in acute distress.    Appearance: He is well-developed.  Musculoskeletal:        General: No deformity.  Neurological:     Mental Status: He is alert and oriented to person, place, and time.     Coordination: Coordination normal.  Psychiatric:        Attention and Perception: Attention and perception normal. He does not perceive auditory or visual hallucinations.        Mood and Affect: Mood normal. Mood is not anxious or depressed. Affect is not labile, blunt, angry or inappropriate.        Speech: Speech normal.  Speech is not slurred.        Behavior: Behavior normal.        Thought Content: Thought content normal. Thought content is not paranoid or delusional. Thought content does not include homicidal or suicidal ideation. Thought content does not include homicidal or suicidal plan.        Cognition and Memory: Cognition and memory normal.        Judgment: Judgment normal.     Comments: Insight intact Good humor.  Mild forgetfulness.  Mild word finding issues     Lab Review:     Component Value Date/Time   NA 139 02/27/2018 1542   NA 142 01/02/2018 1456   K 4.1 02/27/2018 1542   CL 102 02/27/2018 1542   CO2 28 02/27/2018 1542   GLUCOSE 81 02/27/2018 1542   BUN 16 02/27/2018 1542   BUN 11 01/02/2018 1456   CREATININE 1.09 02/27/2018 1542   CALCIUM 9.7 02/27/2018 1542   PROT 7.0 02/27/2018 1542   ALBUMIN 4.1 02/27/2018 1542   AST 29 02/27/2018 1542   ALT 25 02/27/2018 1542   ALKPHOS 64 02/27/2018 1542   BILITOT 1.0 02/27/2018 1542   GFRNONAA >60 02/27/2018 1542   GFRAA >60 02/27/2018 1542       Component Value Date/Time   WBC 7.9 02/27/2018 1542   RBC 4.12 (L) 02/27/2018 1542   HGB 13.3 02/27/2018 1542   HCT 40.2 02/27/2018 1542   PLT 205 02/27/2018 1542   MCV 97.6 02/27/2018 1542   MCH 32.3 02/27/2018 1542   MCHC 33.1 02/27/2018 1542   RDW 12.5 02/27/2018 1542   LYMPHSABS 1.8 02/27/2018 1542   MONOABS 0.7 02/27/2018 1542   EOSABS 0.2 02/27/2018 1542   BASOSABS 0.0 02/27/2018 1542    No results found for: POCLITH, LITHIUM  No results found for: PHENYTOIN, PHENOBARB, VALPROATE, CBMZ   .res Assessment: Plan:    Conlin was seen today for follow-up.  Diagnoses and all orders for this visit:  Bipolar II disorder (HCC) -     sertraline (ZOLOFT) 50 MG tablet; Take 3 tablets (150 mg total) by mouth daily.  Insomnia due to mental condition -     mirtazapine (REMERON) 7.5 MG tablet; Take 1 tablet (7.5 mg total) by mouth at bedtime.    Greater than 50% of face to  face time with patient was spent on counseling and coordination of care. We discussed Mr. Alred has not had any mood symptoms in the past several months.  He has had repeated bouts of depression with occasional of hypomania some of which have lasted months.  He has not done well on mood stabilizers as they have been poorly tolerated.  We have discussed the very significant risk of mood instability mood and mood swings since he is not taking a mood stabilizer.  He is willing to take that risk at this time.  He states he will notify me if he starts having hypomania or depression again.  He is aware of the risks that antidepressants can cause mood swings in bipolar patients but he has not been able to get off of the antidepressant without recurrence of depression.  Unfortunately he does not tend to recognize his hypomania very well but the family does recognize it.  He and his wife agree that he remained stable.  He has a delayed sleep phase but it is not associated with any significant disability.  There is significant concern that he has had a hypomanic episodes in the past some of which have been reasonably prolonged and he is not on a mood stabilizer.  This has been discussed with he and his wife extensively.  Unfortunately he has not tolerated any of the mood stabilizers that he has taken.  They are aware of the risk of future hypomania.  Fortunately he has not had severe manic episodes but his hypomanic episodes have been to disruptive at times.  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  His wife identifies no unusual behaviors.  He has not been able to stop antidepressants without depression recurring and has no interest in stopping antidepressants because of this.  The disability has been far worse from depression in the past and it has been from hypomania.  No med changes indicated.  Follow-up 4 to 6 months or sooner as needed  This appt was 30 mins.     Meredith Staggers MD, DFAPA  Please see After Visit Summary for patient specific instructions.  No future appointments.  No orders of the defined types were placed in this encounter.   -------------------------------

## 2020-08-11 ENCOUNTER — Other Ambulatory Visit: Payer: Self-pay | Admitting: Psychiatry

## 2020-08-11 DIAGNOSIS — F5105 Insomnia due to other mental disorder: Secondary | ICD-10-CM

## 2020-10-28 ENCOUNTER — Other Ambulatory Visit (HOSPITAL_COMMUNITY): Payer: Self-pay | Admitting: Interventional Radiology

## 2020-10-28 DIAGNOSIS — I771 Stricture of artery: Secondary | ICD-10-CM

## 2020-11-09 ENCOUNTER — Other Ambulatory Visit: Payer: Self-pay

## 2020-11-09 ENCOUNTER — Ambulatory Visit (HOSPITAL_COMMUNITY)
Admission: RE | Admit: 2020-11-09 | Discharge: 2020-11-09 | Disposition: A | Discharge status: Home / Self Care | Payer: Medicare Other | Source: Ambulatory Visit | Attending: Interventional Radiology | Admitting: Interventional Radiology

## 2020-11-09 DIAGNOSIS — I771 Stricture of artery: Secondary | ICD-10-CM

## 2020-11-09 NOTE — Progress Notes (Signed)
Carotid artery duplex completed. Refer to "CV Proc" under chart review to view preliminary results.  11/09/2020 1:35 PM Eula Fried., MHA, RVT, RDCS, RDMS

## 2020-11-10 ENCOUNTER — Telehealth (HOSPITAL_COMMUNITY): Payer: Self-pay | Admitting: Radiology

## 2020-11-10 NOTE — Telephone Encounter (Signed)
Spoke to pt's son August Saucer). Per Deveshwar pt will follow-up with US carotids in 3 months time. Pt and son agree to this plan of care. JM

## 2021-01-26 ENCOUNTER — Other Ambulatory Visit: Payer: Self-pay | Admitting: Psychiatry

## 2021-01-26 DIAGNOSIS — F5105 Insomnia due to other mental disorder: Secondary | ICD-10-CM

## 2021-02-02 ENCOUNTER — Other Ambulatory Visit (HOSPITAL_COMMUNITY): Payer: Self-pay | Admitting: Interventional Radiology

## 2021-02-02 DIAGNOSIS — I6930 Unspecified sequelae of cerebral infarction: Secondary | ICD-10-CM

## 2021-02-02 DIAGNOSIS — Z8673 Personal history of transient ischemic attack (TIA), and cerebral infarction without residual deficits: Secondary | ICD-10-CM

## 2021-02-09 ENCOUNTER — Other Ambulatory Visit (HOSPITAL_COMMUNITY): Payer: Self-pay | Admitting: Interventional Radiology

## 2021-02-09 DIAGNOSIS — I639 Cerebral infarction, unspecified: Secondary | ICD-10-CM

## 2021-02-10 ENCOUNTER — Other Ambulatory Visit (HOSPITAL_COMMUNITY): Payer: Self-pay | Admitting: Interventional Radiology

## 2021-02-10 ENCOUNTER — Other Ambulatory Visit: Payer: Self-pay

## 2021-02-10 ENCOUNTER — Ambulatory Visit (HOSPITAL_COMMUNITY)
Admission: RE | Admit: 2021-02-10 | Discharge: 2021-02-10 | Disposition: A | Discharge status: Home / Self Care | Payer: Medicare Other | Source: Ambulatory Visit | Attending: Interventional Radiology | Admitting: Interventional Radiology

## 2021-02-10 DIAGNOSIS — I639 Cerebral infarction, unspecified: Secondary | ICD-10-CM

## 2021-02-10 DIAGNOSIS — I6930 Unspecified sequelae of cerebral infarction: Secondary | ICD-10-CM | POA: Diagnosis present

## 2021-02-10 NOTE — Progress Notes (Signed)
Carotid duplex bilateral study completed.   Please see CV Proc for preliminary results.   Rachel Hodge, RDMS  

## 2021-02-12 HISTORY — PX: IR RADIOLOGIST EVAL & MGMT: IMG5224

## 2021-02-15 ENCOUNTER — Other Ambulatory Visit (HOSPITAL_COMMUNITY): Payer: Self-pay | Admitting: Interventional Radiology

## 2021-02-15 DIAGNOSIS — I639 Cerebral infarction, unspecified: Secondary | ICD-10-CM

## 2021-02-15 DIAGNOSIS — R251 Tremor, unspecified: Secondary | ICD-10-CM

## 2021-02-15 DIAGNOSIS — R413 Other amnesia: Secondary | ICD-10-CM

## 2021-02-26 ENCOUNTER — Telehealth: Payer: Self-pay | Admitting: Psychiatry

## 2021-02-26 ENCOUNTER — Other Ambulatory Visit: Payer: Self-pay

## 2021-02-26 DIAGNOSIS — F3181 Bipolar II disorder: Secondary | ICD-10-CM

## 2021-02-26 MED ORDER — SERTRALINE HCL 50 MG PO TABS: 150.0000 mg | ORAL_TABLET | Freq: Every day | ORAL | 0 refills | Status: DC

## 2021-02-26 NOTE — Telephone Encounter (Signed)
This was previously sent

## 2021-02-26 NOTE — Telephone Encounter (Signed)
Gregory [name]'s wife called requesting a refill on Zoloft called to:  [id number] - [place], [place] - [address] Phone:  [phone]  Fax:  [phone]

## 2021-03-02 ENCOUNTER — Ambulatory Visit (HOSPITAL_COMMUNITY): Payer: Medicare Other

## 2021-03-09 ENCOUNTER — Ambulatory Visit (HOSPITAL_COMMUNITY): Payer: Medicare Other

## 2021-03-15 ENCOUNTER — Ambulatory Visit (HOSPITAL_COMMUNITY)
Admission: RE | Admit: 2021-03-15 | Discharge: 2021-03-15 | Disposition: A | Discharge status: Home / Self Care | Payer: Medicare Other | Source: Ambulatory Visit | Attending: Interventional Radiology | Admitting: Interventional Radiology

## 2021-03-15 ENCOUNTER — Encounter (HOSPITAL_COMMUNITY): Payer: Self-pay

## 2021-03-15 ENCOUNTER — Other Ambulatory Visit: Payer: Self-pay

## 2021-03-15 DIAGNOSIS — I639 Cerebral infarction, unspecified: Secondary | ICD-10-CM

## 2021-03-15 DIAGNOSIS — R251 Tremor, unspecified: Secondary | ICD-10-CM

## 2021-03-15 DIAGNOSIS — R413 Other amnesia: Secondary | ICD-10-CM

## 2021-03-19 ENCOUNTER — Telehealth (HOSPITAL_COMMUNITY): Payer: Self-pay | Admitting: Radiology

## 2021-03-19 NOTE — Telephone Encounter (Signed)
Pt's son, Kathlene November, called wanting results from recent MRI/MRA. Per Deveshwar scan is ok. Will order a carotid US in 6 months time. JM

## 2021-03-27 ENCOUNTER — Other Ambulatory Visit: Payer: Self-pay | Admitting: Psychiatry

## 2021-03-27 DIAGNOSIS — F3181 Bipolar II disorder: Secondary | ICD-10-CM

## 2021-03-29 NOTE — Telephone Encounter (Signed)
Has an appointment on Thursday 4/14

## 2021-03-29 NOTE — Telephone Encounter (Signed)
Please schedule appt

## 2021-04-01 ENCOUNTER — Ambulatory Visit (INDEPENDENT_AMBULATORY_CARE_PROVIDER_SITE_OTHER): Payer: Medicare Other | Admitting: Psychiatry

## 2021-04-01 ENCOUNTER — Encounter: Payer: Self-pay | Admitting: Psychiatry

## 2021-04-01 ENCOUNTER — Other Ambulatory Visit: Payer: Self-pay

## 2021-04-01 DIAGNOSIS — F3181 Bipolar II disorder: Secondary | ICD-10-CM

## 2021-04-01 DIAGNOSIS — I639 Cerebral infarction, unspecified: Secondary | ICD-10-CM | POA: Diagnosis not present

## 2021-04-01 DIAGNOSIS — F5105 Insomnia due to other mental disorder: Secondary | ICD-10-CM

## 2021-04-01 MED ORDER — MIRTAZAPINE 7.5 MG PO TABS: 7.5000 mg | ORAL_TABLET | Freq: Every day | ORAL | 1 refills | Status: DC

## 2021-04-01 MED ORDER — SERTRALINE HCL 50 MG PO TABS: 150.0000 mg | ORAL_TABLET | Freq: Every day | ORAL | 0 refills | Status: DC

## 2021-04-01 NOTE — Progress Notes (Signed)
Gregory [name] [id number] Jun 23, 1934 87 y.o.  Subjective:   Patient ID:  Gregory [name] is a 87 y.o. (DOB [birth year]) male.  Chief Complaint:  Chief Complaint  Patient presents with  . Follow-up    HPI Gregory [name] presents to the office today for follow-up of bipolar 2 depression and chronic insomnia with shift related sleep problems.  Last seen 07/30/2019 with the folloowing noted:  Seen with wife.  Forced into Covid. Retirement.  No major complaints re: sx.  Patient reports stable mood and denies depressed or irritable moods.  Patient denies any recent difficulty with anxiety.  Patient denies difficulty with sleep initiation or maintenance.  Goes to bed until 2-3 AM and up about 11 AM.  Denies appetite disturbance.  Patient reports that energy and motivation have been good.  Patient denies any difficulty with concentration.  Patient denies any suicidal ideation. Wife only concern is his sleep pattern.   He says he goes to sleep fine.  Plan no med changes  06/26/20 Gregory [name], spouse, called to report that Gregory [name] decreased/changed how he is taking is medication.  She is not sure when he did this, but she now seeing signs that the change is affecting him.  He is becoming confused.  Last 2 nights couldn't mae out he words.  He is traveling with his son and has done some little parts in their program, but it hasn't good the last 2 nights.  He is repeating questions.  Sleeping a bit later in the mornings.  He claims he is fine: Not depressed.  But Gregory [name] is seeing him decline slightly and would you to talk with him about how he is taking his medications and the affects changes could cause.  Has appt 8/19 but doesn't want to wait that long for someone to talk with him.  His cell is [phone].   MD response:The only thing he's taking from me is sertraline and mirtazapine and changes in either of those should not cause confusion.  I assume he's not overtaking the Xanax he takes for sleep bc that  can cause those problems especially in the morning.  I'm not prescribing that as I recall. I'm concerned he may have had another stroke.  They need to get him to the ER ASAP to be sure.  08/06/20 appt with the following noted: As far as I'm concerned I'm good.  Not depressed.  Concerned about world situation.   Had cut down sertraline from 150 to 100 mg and sons said he got confused but he didn't notice it.  That is cleared up now.  She says he's back to normal. Cut down on alprazolam at night to 0.5 mg HS.  And still taking mirtazapine 7.5 mg HS.  Still delayed sleep cycle.  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  Tired of doing nothing.    Plan: No med changes  04/01/2021 appointment with following noted: Doing well.  No major complaints.  No mania acknowledged.  Continues to be active in [place] music.  MRI and physical OK.  Tremor is still there intermittently.  One hand weaker. Thankful doing well.  Wife not doing as well as he'd like from the cancer.  She's dizzy.   What put me in depression both times was money but  Over it. Mirtazpaine helping sleep. Patient reports stable mood and denies depressed or irritable moods.  Patient denies any recent difficulty with anxiety.  Patient denies difficulty with sleep initiation or maintenance. Denies appetite disturbance.  Patient reports that energy and motivation have been good.  Patient denies any difficulty with concentration.  Patient denies any suicidal ideation.   Past Psychiatric Medication Trials: Depakote side effects, lithium tremor, Wellbutrin side effects, duloxetine, Pristiq, sertraline, Abilify lost response, buspirone, pramipexole 0.125, lamotrigine no response, Latuda 40, Rexulti, buspirone, methylphenidate, methyl folate, olanzapine, Lunesta, Ambien, Xanax, mirtazapine, trazodone, temazepam, propranolol Under my psychiatric care since 2005  Review of Systems:  Review of Systems  Cardiovascular: Negative for chest pain and  palpitations.  Neurological: Positive for tremors. Negative for dizziness and weakness.    Medications: I have reviewed the patient's current medications.  Current Outpatient Medications  Medication Sig Dispense Refill  . ALPRAZolam (XANAX) 0.5 MG tablet Pt takes 1 tab qhs    . AMBULATORY NON FORMULARY MEDICATION Cock up wrist splint: right and left Dx: carpal tunnel syndrome, G56.0 1 Device 0  . amLODipine (NORVASC) 5 MG tablet Take 5 mg by mouth daily.    Marland Kitchen aspirin 81 MG tablet Take 81 mg by mouth daily.    Marland Kitchen atorvastatin (LIPITOR) 40 MG tablet TAKE 1 TABLET BY MOUTH EVERY DAY TO LOWER CHOLESTEROL.    . Cholecalciferol (D3 VITAMIN PO) Take 2,000 Int'l Units by mouth daily.    . clopidogrel (PLAVIX) 75 MG tablet Take 75 mg by mouth daily.    . flecainide (TAMBOCOR) 50 MG tablet Take 50 mg by mouth 2 (two) times daily.     . Glucosamine HCl (GLUCOSAMINE PO) Take 1,500 mg by mouth daily.    . Multiple Vitamins-Minerals (CENTRUM ADULTS PO) Take 1 tablet by mouth daily.     . Omega-3 Fatty Acids (FISH OIL) 1200 MG CAPS Take 1 capsule by mouth daily.     . propranolol (INDERAL) 10 MG tablet TK 1-2 TS PO BID AS NEEDED FOR TREMORS 60 tablet 1  . ramipril (ALTACE) 10 MG capsule Take 10 mg by mouth daily.    . mirtazapine (REMERON) 7.5 MG tablet Take 1 tablet (7.5 mg total) by mouth at bedtime. 90 tablet 1  . sertraline (ZOLOFT) 50 MG tablet Take 3 tablets (150 mg total) by mouth daily. 270 tablet 0   No current facility-administered medications for this visit.    Medication Side Effects: None  Allergies:  Allergies  Allergen Reactions  . Bupropion Other (See Comments)    Unsure about alleries  . Valproic Acid Other (See Comments)    Unsure about this    Past Medical History:  Diagnosis Date  . Abdominal aneurysm (HCC)   . Depression   . History of kidney stones   . Hyperlipidemia   . Hypertension   . Stroke [facility]) 2004?   TIA    Family History  Problem Relation Age of Onset   . Cancer Mother        breast  . Heart disease Mother   . Heart disease Brother   . Tremor Neg Hx     Social History   Socioeconomic History  . Marital status: Married    Spouse name: Not on file  . Number of children: 2  . Years of education: Not on file  . Highest education level: Some college, no degree  Occupational History  . Not on file  Tobacco Use  . Smoking status: Former Games developer  . Smokeless tobacco: Never Used  Vaping Use  . Vaping Use: Never used  Substance and Sexual Activity  . Alcohol use: No  . Drug use: No  . Sexual activity: Not on  file  Other Topics Concern  . Not on file  Social History Narrative   Lives at home w/ his wife   Right-handed   Caffeine: occasional coffee and tea, decaf tea at home   Social Determinants of Health   Financial Resource Strain: Not on file  Food Insecurity: Not on file  Transportation Needs: Not on file  Physical Activity: Not on file  Stress: Not on file  Social Connections: Not on file  Intimate Partner Violence: Not on file    Past Medical History, Surgical history, Social history, and Family history were reviewed and updated as appropriate.   Please see review of systems for further details on the patient's review from today.   Objective:   Physical Exam:  There were no vitals taken for this visit.  Physical Exam Constitutional:      General: He is not in acute distress.    Appearance: He is well-developed.  Musculoskeletal:        General: No deformity.  Neurological:     Mental Status: He is alert and oriented to person, place, and time.     Coordination: Coordination normal.  Psychiatric:        Attention and Perception: Attention and perception normal. He does not perceive auditory or visual hallucinations.        Mood and Affect: Mood normal. Mood is not anxious or depressed. Affect is not labile, blunt, angry or inappropriate.        Speech: Speech normal. Speech is not rapid and pressured or  slurred.        Behavior: Behavior normal.        Thought Content: Thought content normal. Thought content is not paranoid or delusional. Thought content does not include homicidal or suicidal ideation. Thought content does not include homicidal or suicidal plan.        Cognition and Memory: Cognition and memory normal.        Judgment: Judgment normal.     Comments: Insight intact Good humor.  Mild forgetfulness.  Mild word finding issues     Lab Review:     Component Value Date/Time   NA 139 02/27/2018 1542   NA 142 01/02/2018 1456   K 4.1 02/27/2018 1542   CL 102 02/27/2018 1542   CO2 28 02/27/2018 1542   GLUCOSE 81 02/27/2018 1542   BUN 16 02/27/2018 1542   BUN 11 01/02/2018 1456   CREATININE 1.09 02/27/2018 1542   CALCIUM 9.7 02/27/2018 1542   PROT 7.0 02/27/2018 1542   ALBUMIN 4.1 02/27/2018 1542   AST 29 02/27/2018 1542   ALT 25 02/27/2018 1542   ALKPHOS 64 02/27/2018 1542   BILITOT 1.0 02/27/2018 1542   GFRNONAA >60 02/27/2018 1542   GFRAA >60 02/27/2018 1542       Component Value Date/Time   WBC 7.9 02/27/2018 1542   RBC 4.12 (L) 02/27/2018 1542   HGB 13.3 02/27/2018 1542   HCT 40.2 02/27/2018 1542   PLT 205 02/27/2018 1542   MCV 97.6 02/27/2018 1542   MCH 32.3 02/27/2018 1542   MCHC 33.1 02/27/2018 1542   RDW 12.5 02/27/2018 1542   LYMPHSABS 1.8 02/27/2018 1542   MONOABS 0.7 02/27/2018 1542   EOSABS 0.2 02/27/2018 1542   BASOSABS 0.0 02/27/2018 1542    No results found for: POCLITH, LITHIUM   No results found for: PHENYTOIN, PHENOBARB, VALPROATE, CBMZ   .res Assessment: Plan:    Tab was seen today for follow-up.  Diagnoses and all  orders for this visit:  Bipolar II disorder (HCC) -     sertraline (ZOLOFT) 50 MG tablet; Take 3 tablets (150 mg total) by mouth daily.  Insomnia due to mental condition -     mirtazapine (REMERON) 7.5 MG tablet; Take 1 tablet (7.5 mg total) by mouth at bedtime.    Greater than 50% of face to face time with  patient was spent on counseling and coordination of care. We discussed Gregory [name] has not had any mood symptoms in the past several months.  He has had repeated bouts of depression with occasional of hypomania some of which have lasted months.  He has not done well on mood stabilizers as they have been poorly tolerated.  We have discussed the very significant risk of mood instability mood and mood swings since he is not taking a mood stabilizer.  He is willing to take that risk at this time.  He states he will notify me if he starts having hypomania or depression again.  He is aware of the risks that antidepressants can cause mood swings in bipolar patients but he has not been able to get off of the antidepressant without recurrence of depression.  Unfortunately he does not tend to recognize his hypomania very well but the family does recognize it.  he remained stable.  He has a delayed sleep phase but it is not associated with any significant disability.  There is significant concern that he has had a hypomanic episodes in the past some of which have been reasonably prolonged and he is not on a mood stabilizer.  This has been discussed with he and his wife extensively in previouus appts.  Unfortunately he has not tolerated any of the mood stabilizers that he has taken.  They are aware of the risk of future hypomania.  Fortunately he has not had severe manic episodes but his hypomanic episodes have been to disruptive at times.    He has not been able to stop antidepressants without depression recurring and has no interest in stopping antidepressants because of this.  The disability has been far worse from depression in the past and it has been from hypomania.  No med changes indicated.  Follow-up  6 months or sooner as needed  This appt was 30 mins.    Meredith Staggersarey Cottle MD, DFAPA  Please see After Visit Summary for patient specific instructions.  No future appointments.  No orders of the defined  types were placed in this encounter.   -------------------------------

## 2021-04-26 ENCOUNTER — Other Ambulatory Visit: Payer: Self-pay | Admitting: Psychiatry

## 2021-04-26 DIAGNOSIS — F5105 Insomnia due to other mental disorder: Secondary | ICD-10-CM

## 2021-04-30 NOTE — Telephone Encounter (Signed)
Please review

## 2021-05-09 ENCOUNTER — Other Ambulatory Visit: Payer: Self-pay

## 2021-05-09 ENCOUNTER — Other Ambulatory Visit (HOSPITAL_COMMUNITY): Payer: Medicare Other

## 2021-05-09 ENCOUNTER — Ambulatory Visit (INDEPENDENT_AMBULATORY_CARE_PROVIDER_SITE_OTHER): Payer: Medicare Other

## 2021-05-09 ENCOUNTER — Encounter (HOSPITAL_COMMUNITY): Payer: Self-pay | Admitting: Emergency Medicine

## 2021-05-09 ENCOUNTER — Ambulatory Visit (HOSPITAL_COMMUNITY)
Admission: EM | Admit: 2021-05-09 | Discharge: 2021-05-09 | Disposition: A | Discharge status: Home / Self Care | Payer: Medicare Other | Attending: Physician Assistant | Admitting: Physician Assistant

## 2021-05-09 DIAGNOSIS — R0781 Pleurodynia: Secondary | ICD-10-CM

## 2021-05-09 DIAGNOSIS — M546 Pain in thoracic spine: Secondary | ICD-10-CM | POA: Diagnosis not present

## 2021-05-09 DIAGNOSIS — W19XXXA Unspecified fall, initial encounter: Secondary | ICD-10-CM | POA: Diagnosis not present

## 2021-05-09 DIAGNOSIS — M79604 Pain in right leg: Secondary | ICD-10-CM

## 2021-05-09 DIAGNOSIS — R0782 Intercostal pain: Secondary | ICD-10-CM

## 2021-05-09 MED ORDER — ACETAMINOPHEN 325 MG PO TABS
ORAL_TABLET | ORAL | Status: AC
  Filled 2021-05-09: qty 2

## 2021-05-09 MED ORDER — TIZANIDINE HCL 2 MG PO CAPS: 2.0000 mg | ORAL_CAPSULE | Freq: Every evening | ORAL | 0 refills | Status: DC | PRN

## 2021-05-09 MED ORDER — ACETAMINOPHEN 325 MG PO TABS
650.0000 mg | ORAL_TABLET | Freq: Once | ORAL | Status: AC
  Administered 2021-05-09: 650 mg via ORAL

## 2021-05-09 NOTE — ED Provider Notes (Signed)
[facility]    CSN: [id number] Arrival date & time: 05/09/21  1208      History   Chief Complaint Chief Complaint  Patient presents with  . Fall    HPI Gregory [name] is a 87 y.o. male.   Patient accompanied by his daughter-in-law who help provide the majority of history.  Reports that 10 days ago patient slipped and hit his right leg.  He was seen by his PCP who recommended monitoring of this area.  Reports that he continues to have an area of swelling and pain but this is gradually been improving.  He did have significant bruising which has essentially resolved.  2 days ago, he had another fall where he was sitting on the ground working on his tractor and went to stand up and lost his balance and fell backward bumping onto a piece of farm equipment.  He is absolutely confident that he did not hit his head and denies any loss of consciousness, amnesia surrounding event, vision changes, nausea, vomiting.  He is taking Plavix due to history of CVA.  Daughter in law denies any changes in behavior or increased confusion since injury.  Patient's primary concern as left rib pain that has been present since injury.  Pain is rated 10 on a 0-10 pain scale, localized to thoracic spine and left posterior ribs, described as sharp, worse with certain movements, no alleviating factors identified.  He has not tried any over-the-counter medications for symptom management.  Denies any shortness of breath or chest pain.  Denies any wounds or bruising in this area.  He does live alone but has family who regularly checks on him.     Past Medical History:  Diagnosis Date  . Abdominal aneurysm (HCC)   . Depression   . History of kidney stones   . Hyperlipidemia   . Hypertension   . Stroke [facility](HCC) 2004?   TIA    Patient Active Problem List   Diagnosis Date Noted  . 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 Tremor 06/14/2017  . 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/17/2013     Past Surgical History:  Procedure Laterality Date  . COLON SURGERY     polyps removed  . COLONOSCOPY    . EYE SURGERY     cataract right  . IR ANGIO INTRA EXTRACRAN SEL COM CAROTID INNOMINATE BILAT MOD SED  02/28/2018  . IR ANGIO VERTEBRAL SEL SUBCLAVIAN INNOMINATE UNI R MOD SED  02/28/2018  . IR RADIOLOGIST EVAL & MGMT  02/12/2021  . OTHER SURGICAL HISTORY     Carotid   . RADIOLOGY WITH ANESTHESIA N/A 02/28/2018   Procedure: STENTING;  Surgeon: Julieanne Cottoneveshwar, Sanjeev, MD;  Location: [facility] OR;  Service: Radiology;  Laterality: N/A;       Home Medications    Prior to Admission medications   Medication Sig Start Date End Date Taking? Authorizing Provider  tizanidine (ZANAFLEX) 2 MG capsule Take 1 capsule (2 mg total) by mouth at bedtime as needed for muscle spasms. 05/09/21  Yes Juleon Narang, Noberto RetortErin K, PA-C  ALPRAZolam Prudy Feeler(XANAX) 0.5 MG tablet Pt takes 1 tab qhs 03/17/17   [provider]  AMBULATORY NON FORMULARY MEDICATION Cock up wrist splint: right and left Dx: carpal tunnel syndrome, G56.0 04/21/20   Tat, Octaviano Battyebecca S, DO  amLODipine (NORVASC) 5 MG tablet Take 5 mg by mouth daily.    [provider]  aspirin 81 MG tablet Take 81 mg by mouth daily.  [provider]  atorvastatin (LIPITOR) 40 MG tablet TAKE 1 TABLET BY MOUTH EVERY DAY TO LOWER CHOLESTEROL. 03/17/17   [provider]  Cholecalciferol (D3 VITAMIN PO) Take 2,000 Int'l Units by mouth daily.    [provider]  clopidogrel (PLAVIX) 75 MG tablet Take 75 mg by mouth daily.    [provider]  flecainide (TAMBOCOR) 50 MG tablet Take 50 mg by mouth 2 (two) times daily.  03/16/17   [provider]  Glucosamine HCl (GLUCOSAMINE PO) Take 1,500 mg by mouth daily.    [provider]  mirtazapine (REMERON) 7.5 MG tablet Take 1 tablet (7.5 mg total) by mouth at bedtime. 04/01/21   Cottle, Steva Ready., MD  Multiple Vitamins-Minerals (CENTRUM ADULTS PO) Take 1 tablet by mouth daily.      [provider]  Omega-3 Fatty Acids (FISH OIL) 1200 MG CAPS Take 1 capsule by mouth daily.     [provider]  propranolol (INDERAL) 10 MG tablet TK 1-2 TS PO BID AS NEEDED FOR TREMORS 07/31/19   Cottle, Steva Ready., MD  ramipril (ALTACE) 10 MG capsule Take 10 mg by mouth daily.    [provider]  sertraline (ZOLOFT) 50 MG tablet Take 3 tablets (150 mg total) by mouth daily. 04/01/21   Cottle, Steva Ready., MD    Family History Family History  Problem Relation Age of Onset  . Cancer Mother        breast  . Heart disease Mother   . Heart disease Brother   . Tremor Neg Hx     Social History Social History   Tobacco Use  . Smoking status: Former Games developer  . Smokeless tobacco: Never Used  Vaping Use  . Vaping Use: Never used  Substance Use Topics  . Alcohol use: No  . Drug use: No     Allergies   Bupropion and Valproic acid   Review of Systems Review of Systems  Constitutional: Positive for activity change. Negative for appetite change, fatigue and fever.  Respiratory: Negative for cough and shortness of breath.   Cardiovascular: Negative for chest pain.  Gastrointestinal: Negative for abdominal pain, diarrhea, nausea and vomiting.  Musculoskeletal: Positive for arthralgias, back pain and myalgias.  Neurological: Negative for dizziness, weakness, light-headedness, numbness and headaches.     Physical Exam Triage Vital Signs ED Triage Vitals [05/09/21 1422]  Enc Vitals Group     BP      Pulse      Resp      Temp      Temp src      SpO2      Weight      Height      Head Circumference      Peak Flow      Pain Score 10     Pain Loc      Pain Edu?      Excl. in GC?    No data found.  Updated Vital Signs BP 133/67 (BP Location: Right Arm)   Pulse 62   Temp 98.1 F (36.7 C) (Oral)   Resp (!) 21   SpO2 95%   Visual Acuity Right Eye Distance:   Left Eye Distance:   Bilateral Distance:    Right Eye Near:   Left Eye Near:     Bilateral Near:     Physical Exam Vitals reviewed.  Constitutional:      General: He is awake.     Appearance: Normal  appearance. He is normal weight. He is not ill-appearing.     Comments: Very pleasant male appears stated age in no acute distress  HENT:     Head: Normocephalic and atraumatic. No raccoon eyes, Battle's sign, contusion or laceration.     Mouth/Throat:     Pharynx: No oropharyngeal exudate or posterior oropharyngeal erythema.  Cardiovascular:     Rate and Rhythm: Normal rate and regular rhythm.     Heart sounds: No murmur heard.   Pulmonary:     Effort: Pulmonary effort is normal.     Breath sounds: Normal breath sounds. No stridor. No wheezing, rhonchi or rales.     Comments: Clear to auscultation bilaterally Chest:     Chest wall: Tenderness present. No deformity or swelling.     Comments: Significant tenderness palpation over left inferior posterior ribs.  No deformity noted. Abdominal:     General: Bowel sounds are normal.     Palpations: Abdomen is soft.     Tenderness: There is no abdominal tenderness.  Musculoskeletal:     Cervical back: No tenderness or bony tenderness.     Thoracic back: Tenderness and bony tenderness present.     Lumbar back: No tenderness or bony tenderness.       Back:     Right lower leg: Deformity, tenderness and bony tenderness present.       Legs:     Comments: Pain percussion of lower thoracic vertebrae.  Significant tenderness to palpation of left thoracic paraspinal muscles.  Swelling and tenderness palpation over proximal right tibia.  No deformity noted.  Patient able to ambulate unassisted.  Neurological:     Mental Status: He is alert.  Psychiatric:        Behavior: Behavior is cooperative.      UC Treatments / Results  Labs (all labs ordered are listed, but only abnormal results are displayed) Labs Reviewed - No data to display  EKG   Radiology DG Ribs Unilateral W/Chest Left  Result Date:  05/09/2021 CLINICAL DATA:  Fall, left rib pain EXAM: LEFT RIBS AND CHEST - 3+ VIEW COMPARISON:  None. FINDINGS: No visible rib fracture. Left base atelectasis. No effusion or pneumothorax. IMPRESSION: No visible rib fracture. Left base atelectasis. Electronically Signed   By: Charlett Nose M.D.   On: 05/09/2021 15:52   DG Thoracic Spine 2 View  Result Date: 05/09/2021 CLINICAL DATA:  Fall EXAM: THORACIC SPINE 2 VIEWS COMPARISON:  None. FINDINGS: Scoliosis in the upper thoracic spine. Diffuse degenerative changes. No fracture or focal bone lesion. Aortic atherosclerosis. IMPRESSION: No acute bony abnormality. Electronically Signed   By: Charlett Nose M.D.   On: 05/09/2021 15:50   DG Tibia/Fibula Right  Result Date: 05/09/2021 CLINICAL DATA:  Fall, knee pain EXAM: RIGHT TIBIA AND FIBULA - 2 VIEW COMPARISON:  None. FINDINGS: No acute bony abnormality. Specifically, no fracture, subluxation, or dislocation. Vascular calcifications noted. Soft tissues are intact. IMPRESSION: No acute bony abnormality. Electronically Signed   By: Charlett Nose M.D.   On: 05/09/2021 15:53    Procedures Procedures (including critical care time)  Medications Ordered in UC Medications  acetaminophen (TYLENOL) tablet 650 mg (650 mg Oral Given 05/09/21 1453)    Initial Impression / Assessment and Plan / UC Course  I have reviewed the triage vital signs and the nursing notes.  Pertinent labs & imaging results that were available during my care of the patient were reviewed by me and considered in my medical decision making (see  chart for details).     Discussed that if there is any concern patient had his head he would need to have a head CT given age and Plavix but he is confident that he did not have any head injury with fall and declined transport to ER.  X-rays obtained of the ribs, back, leg were all normal with no acute abnormalities.  Atelectasis was seen on x-ray and patient was encouraged to do incentive spirometer  to help manage this.  He cannot take NSAIDs due to blood thinning medications but will continue Tylenol 1000 mg up to 3 times a day.  He was given Zanaflex 2 mg to be taken at night but discussed this can increase his risk for fall so he should only take it if someone is with him.  Also discussed he should not drive or drink alcohol with this medication.  Recommended heat and stretch for additional symptom relief.  Discussed alarm symptoms that warrant emergent evaluation.  Strict return precautions given to which patient and daughter-in-law expressed understanding.  Final Clinical Impressions(s) / UC Diagnoses   Final diagnoses:  Fall, initial encounter  Acute left-sided thoracic back pain  Rib pain on left side  Right leg pain     Discharge Instructions     There were no fractures on your x-rays which is great news.  Continue using Tylenol to manage your pain.  Take tizanidine at night.  This can make you have a higher chance of falling so please not take it if you are by yourself.  You should not drive or drink alcohol taking this medication.  It is important that you make sure you are taking deep breaths so please get a incentive spirometer from [employer] and use this several times per day.  If you have any confusion or headaches you need to go to the hospital as we discussed for head CT.  If anything changes please return for reevaluation.  Follow-up with PCP in 1 week.    ED Prescriptions    Medication Sig Dispense Auth. Provider   tizanidine (ZANAFLEX) 2 MG capsule Take 1 capsule (2 mg total) by mouth at bedtime as needed for muscle spasms. 20 capsule Darchelle Nunes, Noberto Retort, PA-C     PDMP not reviewed this encounter.   Jeani Hawking, PA-C 05/09/21 1617

## 2021-05-09 NOTE — Discharge Instructions (Addendum)
There were no fractures on your x-rays which is great news.  Continue using Tylenol to manage your pain.  Take tizanidine at night.  This can make you have a higher chance of falling so please not take it if you are by yourself.  You should not drive or drink alcohol taking this medication.  It is important that you make sure you are taking deep breaths so please get a incentive spirometer from [employer] and use this several times per day.  If you have any confusion or headaches you need to go to the hospital as we discussed for head CT.  If anything changes please return for reevaluation.  Follow-up with PCP in 1 week.

## 2021-05-09 NOTE — ED Triage Notes (Signed)
Fell 2 days ago, fell backwards.  Denies bruise.  Pain in left ribcage.    There was a fall 2 weeks ago, bruising right lower leg and family is requesting this injury to be looked at as well

## 2021-06-09 ENCOUNTER — Other Ambulatory Visit: Payer: Self-pay | Admitting: Psychiatry

## 2021-06-09 DIAGNOSIS — F5105 Insomnia due to other mental disorder: Secondary | ICD-10-CM

## 2021-06-12 ENCOUNTER — Other Ambulatory Visit: Payer: Self-pay | Admitting: Psychiatry

## 2021-06-12 DIAGNOSIS — F3181 Bipolar II disorder: Secondary | ICD-10-CM

## 2021-08-25 ENCOUNTER — Other Ambulatory Visit: Payer: Self-pay | Admitting: Psychiatry

## 2021-08-25 DIAGNOSIS — F5105 Insomnia due to other mental disorder: Secondary | ICD-10-CM

## 2021-10-06 ENCOUNTER — Other Ambulatory Visit: Payer: Self-pay

## 2021-10-06 ENCOUNTER — Telehealth: Payer: Self-pay | Admitting: Psychiatry

## 2021-10-06 DIAGNOSIS — F3181 Bipolar II disorder: Secondary | ICD-10-CM

## 2021-10-06 MED ORDER — SERTRALINE HCL 50 MG PO TABS: ORAL_TABLET | ORAL | 0 refills | Status: DC

## 2021-10-06 NOTE — Telephone Encounter (Signed)
Please schedule appt.Will send 30 day

## 2021-10-06 NOTE — Telephone Encounter (Signed)
Pt called requesting refill for Sertraline. [facility]

## 2021-10-07 NOTE — Telephone Encounter (Signed)
Left a message for pt to schedule appt

## 2021-10-14 ENCOUNTER — Other Ambulatory Visit: Payer: Self-pay

## 2021-10-14 ENCOUNTER — Ambulatory Visit (INDEPENDENT_AMBULATORY_CARE_PROVIDER_SITE_OTHER): Payer: Medicare Other | Admitting: Psychiatry

## 2021-10-14 ENCOUNTER — Encounter: Payer: Self-pay | Admitting: Psychiatry

## 2021-10-14 DIAGNOSIS — F5105 Insomnia due to other mental disorder: Secondary | ICD-10-CM

## 2021-10-14 DIAGNOSIS — F3181 Bipolar II disorder: Secondary | ICD-10-CM | POA: Diagnosis not present

## 2021-10-14 DIAGNOSIS — F39 Unspecified mood [affective] disorder: Secondary | ICD-10-CM | POA: Diagnosis not present

## 2021-10-14 DIAGNOSIS — I639 Cerebral infarction, unspecified: Secondary | ICD-10-CM

## 2021-10-14 MED ORDER — SERTRALINE HCL 50 MG PO TABS: ORAL_TABLET | ORAL | 2 refills | Status: DC

## 2021-10-14 MED ORDER — MIRTAZAPINE 7.5 MG PO TABS: 7.5000 mg | ORAL_TABLET | Freq: Every day | ORAL | 1 refills | Status: DC

## 2021-10-14 NOTE — Progress Notes (Signed)
AUREN VALDES [id number] 24-Jul-1934 87 y.o.  Subjective:   Patient ID:  Gregory [name] is a 87 y.o. (DOB 03/26/1934) male.  Chief Complaint:  Chief Complaint  Patient presents with   Follow-up   Bipolar II disorder     HPI KENDAL RAFFO presents to the office today for follow-up of bipolar 2 depression and chronic insomnia with shift related sleep problems.  seen 07/30/2019 with the folloowing noted:  Seen with wife.  Forced into Covid. Retirement.  No major complaints re: sx.  Patient reports stable mood and denies depressed or irritable moods.  Patient denies any recent difficulty with anxiety.  Patient denies difficulty with sleep initiation or maintenance.  Goes to bed until 2-3 AM and up about 11 AM.  Denies appetite disturbance.  Patient reports that energy and motivation have been good.  Patient denies any difficulty with concentration.  Patient denies any suicidal ideation. Wife only concern is his sleep pattern.   He says he goes to sleep fine.  Plan no med changes  06/26/20 [name], spouse, called to report that [name] decreased/changed how he is taking is medication.  She is not sure when he did this, but she now seeing signs that the change is affecting him.  He is becoming confused.  Last 2 nights couldn't mae out he words.  He is traveling with his son and has done some little parts in their program, but it hasn't good the last 2 nights.  He is repeating questions.  Sleeping a bit later in the mornings.  He claims he is fine: Not depressed.  But [name] is seeing him decline slightly and would you to talk with him about how he is taking his medications and the affects changes could cause.  Has appt 8/19 but doesn't want to wait that long for someone to talk with him.  His cell is [phone].   MD response:The only thing he's taking from me is sertraline and mirtazapine and changes in either of those should not cause confusion.  I assume he's not overtaking the Xanax he takes  for sleep bc that can cause those problems especially in the morning.  I'm not prescribing that as I recall. I'm concerned he may have had another stroke.  They need to get him to the ER ASAP to be sure.  08/06/20 appt with the following noted: As far as I'm concerned I'm good.  Not depressed.  Concerned about world situation.   Had cut down sertraline from 150 to 100 mg and sons said he got confused but he didn't notice it.  That is cleared up now.  She says he's back to normal. Cut down on alprazolam at night to 0.5 mg HS.  And still taking mirtazapine 7.5 mg HS.  Still delayed sleep cycle.  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  Tired of doing nothing.    Plan: No med changes  04/01/2021 appointment with following noted: Doing well.  No major complaints.  No mania acknowledged.  Continues to be active in [place] music.  MRI and physical OK.  Tremor is still there intermittently.  One hand weaker. Thankful doing well.  Wife not doing as well as he'd like from the cancer.  She's dizzy.   What put me in depression both times was money but  Over it. Mirtazpaine helping sleep. Patient reports stable mood and denies depressed or irritable moods.  Patient denies any recent difficulty with anxiety.  Patient denies difficulty with sleep initiation or  maintenance. Denies appetite disturbance.  Patient reports that energy and motivation have been good.  Patient denies any difficulty with concentration.  Patient denies any suicidal ideation. Plan: No med changes.  Continue alprazolam 0.5 mg and mirtazapine 7.5 mg nightly for sleep. Continue sertraline 150 mg daily  10/14/2021 appointment with the following noted: No depression.  Patient reports stable mood and denies depressed or irritable moods.  Patient denies any recent difficulty with anxiety.  Patient denies difficulty with sleep initiation or maintenance. Denies appetite disturbance.  Patient reports that energy and motivation have been good.   Patient denies any difficulty with concentration.  Patient denies any suicidal ideation. Health good.   No SE.  Occ tremor.  No sig caffeine.   Consistent with sertraline 150 mg daily. Enjoys farm work and music.    Past Psychiatric Medication Trials: Depakote side effects, lithium tremor, Wellbutrin side effects, duloxetine, Pristiq, sertraline, Abilify lost response, buspirone, pramipexole 0.125, lamotrigine no response, Latuda 40, Rexulti, buspirone, methylphenidate, methyl folate, olanzapine, Lunesta, Ambien, Xanax, mirtazapine, trazodone, temazepam, propranolol Under my psychiatric care since 2005  Review of Systems:  Review of Systems  Cardiovascular:  Negative for palpitations.  Neurological:  Positive for tremors. Negative for dizziness and weakness.   Medications: I have reviewed the patient's current medications.  Current Outpatient Medications  Medication Sig Dispense Refill   ALPRAZolam (XANAX) 0.5 MG tablet Pt takes 1 tab qhs     amLODipine (NORVASC) 5 MG tablet Take 5 mg by mouth daily.     aspirin 81 MG tablet Take 81 mg by mouth daily.     atorvastatin (LIPITOR) 40 MG tablet TAKE 1 TABLET BY MOUTH EVERY DAY TO LOWER CHOLESTEROL.     Cholecalciferol (D3 VITAMIN PO) Take 2,000 Int'l Units by mouth daily.     clopidogrel (PLAVIX) 75 MG tablet Take 75 mg by mouth daily.     flecainide (TAMBOCOR) 50 MG tablet Take 50 mg by mouth 2 (two) times daily.      Glucosamine HCl (GLUCOSAMINE PO) Take 1,500 mg by mouth daily.     Multiple Vitamins-Minerals (CENTRUM ADULTS PO) Take 1 tablet by mouth daily.      Omega-3 Fatty Acids (FISH OIL) 1200 MG CAPS Take 1 capsule by mouth daily.      ramipril (ALTACE) 10 MG capsule Take 10 mg by mouth daily.     mirtazapine (REMERON) 7.5 MG tablet Take 1 tablet (7.5 mg total) by mouth at bedtime. 90 tablet 1   propranolol (INDERAL) 10 MG tablet TK 1-2 TS PO BID AS NEEDED FOR TREMORS (Patient not taking: Reported on 10/14/2021) 60 tablet 1    sertraline (ZOLOFT) 50 MG tablet TAKE 3 TABLETS(150 MG) BY MOUTH DAILY 270 tablet 2   tizanidine (ZANAFLEX) 2 MG capsule Take 1 capsule (2 mg total) by mouth at bedtime as needed for muscle spasms. (Patient not taking: Reported on 10/14/2021) 20 capsule 0   No current facility-administered medications for this visit.    Medication Side Effects: None  Allergies:  Allergies  Allergen Reactions   Bupropion Other (See Comments)    Unsure about alleries   Valproic Acid Other (See Comments)    Unsure about this    Past Medical History:  Diagnosis Date   Abdominal aneurysm    Depression    History of kidney stones    Hyperlipidemia    Hypertension    Stroke [facility]) 2004?   TIA    Family History  Problem Relation Age of Onset  Cancer Mother        breast   Heart disease Mother    Heart disease Brother    Tremor Neg Hx     Social History   Socioeconomic History   Marital status: Married    Spouse name: Not on file   Number of children: 2   Years of education: Not on file   Highest education level: Some college, no degree  Occupational History   Not on file  Tobacco Use   Smoking status: Former   Smokeless tobacco: Never  Building services engineer Use: Never used  Substance and Sexual Activity   Alcohol use: No   Drug use: No   Sexual activity: Not on file  Other Topics Concern   Not on file  Social History Narrative   Lives at home w/ his wife   Right-handed   Caffeine: occasional coffee and tea, decaf tea at home   Social Determinants of Health   Financial Resource Strain: Not on file  Food Insecurity: Not on file  Transportation Needs: Not on file  Physical Activity: Not on file  Stress: Not on file  Social Connections: Not on file  Intimate Partner Violence: Not on file    Past Medical History, Surgical history, Social history, and Family history were reviewed and updated as appropriate.   Please see review of systems for further details on the patient's  review from today.   Objective:   Physical Exam:  There were no vitals taken for this visit.  Physical Exam Constitutional:      General: He is not in acute distress.    Appearance: He is well-developed.  Musculoskeletal:        General: No deformity.  Neurological:     Mental Status: He is alert and oriented to person, place, and time.     Coordination: Coordination normal.  Psychiatric:        Attention and Perception: Attention and perception normal. He does not perceive auditory or visual hallucinations.        Mood and Affect: Mood normal. Mood is not anxious or depressed. Affect is not labile, blunt, angry or inappropriate.        Speech: Speech normal. Speech is not rapid and pressured or slurred.        Behavior: Behavior normal. Behavior is not slowed.        Thought Content: Thought content normal. Thought content is not paranoid or delusional. Thought content does not include homicidal or suicidal ideation. Thought content does not include homicidal or suicidal plan.        Cognition and Memory: Cognition and memory normal.        Judgment: Judgment normal.     Comments: Insight intact Good humor.  Mild forgetfulness.  Mild word finding issues    Lab Review:     Component Value Date/Time   NA 139 02/27/2018 1542   NA 142 01/02/2018 1456   K 4.1 02/27/2018 1542   CL 102 02/27/2018 1542   CO2 28 02/27/2018 1542   GLUCOSE 81 02/27/2018 1542   BUN 16 02/27/2018 1542   BUN 11 01/02/2018 1456   CREATININE 1.09 02/27/2018 1542   CALCIUM 9.7 02/27/2018 1542   PROT 7.0 02/27/2018 1542   ALBUMIN 4.1 02/27/2018 1542   AST 29 02/27/2018 1542   ALT 25 02/27/2018 1542   ALKPHOS 64 02/27/2018 1542   BILITOT 1.0 02/27/2018 1542   GFRNONAA >60 02/27/2018 1542   GFRAA >60  02/27/2018 1542       Component Value Date/Time   WBC 7.9 02/27/2018 1542   RBC 4.12 (L) 02/27/2018 1542   HGB 13.3 02/27/2018 1542   HCT 40.2 02/27/2018 1542   PLT 205 02/27/2018 1542   MCV 97.6  02/27/2018 1542   MCH 32.3 02/27/2018 1542   MCHC 33.1 02/27/2018 1542   RDW 12.5 02/27/2018 1542   LYMPHSABS 1.8 02/27/2018 1542   MONOABS 0.7 02/27/2018 1542   EOSABS 0.2 02/27/2018 1542   BASOSABS 0.0 02/27/2018 1542    No results found for: POCLITH, LITHIUM   No results found for: PHENYTOIN, PHENOBARB, VALPROATE, CBMZ   .res Assessment: Plan:    Ariyon was seen today for follow-up and bipolar ii disorder .  Diagnoses and all orders for this visit:  Episodic mood disorder (HCC)  Insomnia due to mental condition -     mirtazapine (REMERON) 7.5 MG tablet; Take 1 tablet (7.5 mg total) by mouth at bedtime.  Bipolar II disorder (HCC) -     sertraline (ZOLOFT) 50 MG tablet; TAKE 3 TABLETS(150 MG) BY MOUTH DAILY   Greater than 50% of face to face time with patient was spent on counseling and coordination of care. We discussed Mr. Pfluger has not had any mood symptoms in the past several months.  He has had repeated bouts of depression with occasional of hypomania some of which have lasted months.  He has not done well on mood stabilizers as they have been poorly tolerated.  We have discussed the very significant risk of mood instability mood and mood swings since he is not taking a mood stabilizer.  He is willing to take that risk at this time.  He states he will notify me if he starts having hypomania or depression again.  He is aware of the risks that antidepressants can cause mood swings in bipolar patients but he has not been able to get off of the antidepressant without recurrence of depression.  Unfortunately he does not tend to recognize his hypomania very well but the family does recognize it.  he remained stable.  He has a delayed sleep phase but it is not associated with any significant disability.  There is significant concern that he has had a hypomanic episodes in the past some of which have been reasonably prolonged and he is not on a mood stabilizer.  This has been  discussed with he and his wife extensively in previouus appts.  Unfortunately he has not tolerated any of the mood stabilizers that he has taken.  They are aware of the risk of future hypomania.  Fortunately he has not had severe manic episodes but his hypomanic episodes have been to disruptive at times.    He has not been able to stop antidepressants without depression recurring and has no interest in stopping antidepressants because of this.  The disability has been far worse from depression in the past and it has been from hypomania.  Tremor stable but not gone  No med changes indicated.  Continue alprazolam 0.5 mg and mirtazapine 7.5 mg nightly for sleep. Continue sertraline 150 mg daily  Follow-up  6 months or sooner as needed  Meredith Staggers MD, DFAPA  Please see After Visit Summary for patient specific instructions.  No future appointments.  No orders of the defined types were placed in this encounter.   -------------------------------

## 2021-12-21 ENCOUNTER — Ambulatory Visit: Payer: Medicare Other | Admitting: Psychiatry

## 2022-04-28 ENCOUNTER — Other Ambulatory Visit: Payer: Self-pay | Admitting: Psychiatry

## 2022-04-28 DIAGNOSIS — F5105 Insomnia due to other mental disorder: Secondary | ICD-10-CM

## 2022-04-29 NOTE — Telephone Encounter (Signed)
Please call patient to schedule. Was due last month.  ?

## 2022-05-02 NOTE — Telephone Encounter (Signed)
LVM for pt to call and schedule  °

## 2022-07-27 ENCOUNTER — Other Ambulatory Visit: Payer: Self-pay | Admitting: Psychiatry

## 2022-07-27 DIAGNOSIS — F3181 Bipolar II disorder: Secondary | ICD-10-CM

## 2022-07-27 NOTE — Telephone Encounter (Signed)
Please schedule appt

## 2022-07-28 NOTE — Telephone Encounter (Signed)
Pt is scheduled 11/7

## 2022-08-01 ENCOUNTER — Other Ambulatory Visit: Payer: Self-pay | Admitting: Radiology

## 2022-10-25 ENCOUNTER — Ambulatory Visit: Payer: Medicare Other | Admitting: Psychiatry

## 2022-11-14 ENCOUNTER — Telehealth: Payer: Self-pay | Admitting: Psychiatry

## 2022-11-14 NOTE — Telephone Encounter (Signed)
Pt LVM @ 2:10p.  He would like his default pharmacy changed to [facility] on [address] in [place] due to insurance change.  Next appt 1/23

## 2022-11-14 NOTE — Telephone Encounter (Signed)
Pharmacy profile changed as requested.

## 2022-11-18 ENCOUNTER — Telehealth: Payer: Self-pay | Admitting: Psychiatry

## 2022-11-18 ENCOUNTER — Other Ambulatory Visit: Payer: Self-pay

## 2022-11-18 DIAGNOSIS — F3181 Bipolar II disorder: Secondary | ICD-10-CM

## 2022-11-18 DIAGNOSIS — F5105 Insomnia due to other mental disorder: Secondary | ICD-10-CM

## 2022-11-18 MED ORDER — SERTRALINE HCL 50 MG PO TABS: ORAL_TABLET | ORAL | 1 refills | Status: DC

## 2022-11-18 MED ORDER — MIRTAZAPINE 7.5 MG PO TABS: ORAL_TABLET | ORAL | 1 refills | Status: DC

## 2022-11-18 NOTE — Telephone Encounter (Signed)
Next visit is  

## 2022-11-18 NOTE — Telephone Encounter (Signed)
Sent!

## 2022-11-18 NOTE — Telephone Encounter (Signed)
Next visit is 01/10/23. Gregory [name] is requesting a refill on Mirtazapine and Zoloft to be [facility] [place], [place]

## 2022-12-13 ENCOUNTER — Other Ambulatory Visit: Payer: Self-pay | Admitting: Psychiatry

## 2022-12-13 DIAGNOSIS — F5105 Insomnia due to other mental disorder: Secondary | ICD-10-CM

## 2022-12-13 DIAGNOSIS — F3181 Bipolar II disorder: Secondary | ICD-10-CM

## 2022-12-23 ENCOUNTER — Other Ambulatory Visit: Payer: Self-pay

## 2022-12-23 ENCOUNTER — Emergency Department (HOSPITAL_BASED_OUTPATIENT_CLINIC_OR_DEPARTMENT_OTHER): Payer: PPO

## 2022-12-23 ENCOUNTER — Inpatient Hospital Stay (HOSPITAL_BASED_OUTPATIENT_CLINIC_OR_DEPARTMENT_OTHER)
Admission: EM | Admit: 2022-12-23 | Discharge: 2022-12-26 | DRG: 281 | Disposition: A | Discharge status: Home / Self Care | Payer: PPO | Attending: Cardiology | Admitting: Cardiology

## 2022-12-23 ENCOUNTER — Encounter (HOSPITAL_BASED_OUTPATIENT_CLINIC_OR_DEPARTMENT_OTHER): Payer: Self-pay | Admitting: Emergency Medicine

## 2022-12-23 DIAGNOSIS — F32A Depression, unspecified: Secondary | ICD-10-CM | POA: Diagnosis present

## 2022-12-23 DIAGNOSIS — Z1152 Encounter for screening for COVID-19: Secondary | ICD-10-CM

## 2022-12-23 DIAGNOSIS — I440 Atrioventricular block, first degree: Secondary | ICD-10-CM | POA: Diagnosis present

## 2022-12-23 DIAGNOSIS — N179 Acute kidney failure, unspecified: Secondary | ICD-10-CM

## 2022-12-23 DIAGNOSIS — Z888 Allergy status to other drugs, medicaments and biological substances status: Secondary | ICD-10-CM

## 2022-12-23 DIAGNOSIS — R911 Solitary pulmonary nodule: Secondary | ICD-10-CM | POA: Diagnosis present

## 2022-12-23 DIAGNOSIS — I10 Essential (primary) hypertension: Secondary | ICD-10-CM

## 2022-12-23 DIAGNOSIS — I214 Non-ST elevation (NSTEMI) myocardial infarction: Secondary | ICD-10-CM | POA: Diagnosis not present

## 2022-12-23 DIAGNOSIS — I25110 Atherosclerotic heart disease of native coronary artery with unstable angina pectoris: Secondary | ICD-10-CM | POA: Diagnosis present

## 2022-12-23 DIAGNOSIS — Z7902 Long term (current) use of antithrombotics/antiplatelets: Secondary | ICD-10-CM

## 2022-12-23 DIAGNOSIS — N189 Chronic kidney disease, unspecified: Secondary | ICD-10-CM | POA: Diagnosis present

## 2022-12-23 DIAGNOSIS — I700 Atherosclerosis of aorta: Secondary | ICD-10-CM | POA: Diagnosis present

## 2022-12-23 DIAGNOSIS — I200 Unstable angina: Secondary | ICD-10-CM | POA: Diagnosis present

## 2022-12-23 DIAGNOSIS — Z8249 Family history of ischemic heart disease and other diseases of the circulatory system: Secondary | ICD-10-CM

## 2022-12-23 DIAGNOSIS — Z79899 Other long term (current) drug therapy: Secondary | ICD-10-CM

## 2022-12-23 DIAGNOSIS — Z7982 Long term (current) use of aspirin: Secondary | ICD-10-CM

## 2022-12-23 DIAGNOSIS — Z809 Family history of malignant neoplasm, unspecified: Secondary | ICD-10-CM

## 2022-12-23 DIAGNOSIS — I129 Hypertensive chronic kidney disease with stage 1 through stage 4 chronic kidney disease, or unspecified chronic kidney disease: Secondary | ICD-10-CM | POA: Diagnosis present

## 2022-12-23 DIAGNOSIS — Z8673 Personal history of transient ischemic attack (TIA), and cerebral infarction without residual deficits: Secondary | ICD-10-CM

## 2022-12-23 DIAGNOSIS — I7143 Infrarenal abdominal aortic aneurysm, without rupture: Secondary | ICD-10-CM | POA: Diagnosis present

## 2022-12-23 DIAGNOSIS — Z87891 Personal history of nicotine dependence: Secondary | ICD-10-CM

## 2022-12-23 DIAGNOSIS — E785 Hyperlipidemia, unspecified: Secondary | ICD-10-CM | POA: Diagnosis present

## 2022-12-23 DIAGNOSIS — I209 Angina pectoris, unspecified: Secondary | ICD-10-CM | POA: Diagnosis present

## 2022-12-23 LAB — CBC
HCT: 39.1 % (ref 39.0–52.0)
HCT: 36.4 % — ABNORMAL LOW (ref 39.0–52.0)
Hemoglobin: 12.8 g/dL — ABNORMAL LOW (ref 13.0–17.0)
Hemoglobin: 12.5 g/dL — ABNORMAL LOW (ref 13.0–17.0)
MCH: 31.1 pg (ref 26.0–34.0)
MCH: 31.6 pg (ref 26.0–34.0)
MCHC: 32.7 g/dL (ref 30.0–36.0)
MCHC: 34.3 g/dL (ref 30.0–36.0)
MCV: 95.1 fL (ref 80.0–100.0)
MCV: 91.9 fL (ref 80.0–100.0)
Platelets: 169 10*3/uL (ref 150–400)
Platelets: 174 10*3/uL (ref 150–400)
RBC: 4.11 MIL/uL — ABNORMAL LOW (ref 4.22–5.81)
RBC: 3.96 MIL/uL — ABNORMAL LOW (ref 4.22–5.81)
RDW: 12.5 % (ref 11.5–15.5)
RDW: 12.4 % (ref 11.5–15.5)
WBC: 7.5 10*3/uL (ref 4.0–10.5)
WBC: 7 10*3/uL (ref 4.0–10.5)
nRBC: 0 % (ref 0.0–0.2)
nRBC: 0 % (ref 0.0–0.2)

## 2022-12-23 LAB — D-DIMER, QUANTITATIVE: D-Dimer, Quant: 1.13 ug/mL-FEU — ABNORMAL HIGH (ref 0.00–0.50)

## 2022-12-23 LAB — BASIC METABOLIC PANEL
Anion gap: 11 (ref 5–15)
BUN: 16 mg/dL (ref 8–23)
CO2: 26 mmol/L (ref 22–32)
Calcium: 10.1 mg/dL (ref 8.9–10.3)
Chloride: 102 mmol/L (ref 98–111)
Creatinine, Ser: 1.31 mg/dL — ABNORMAL HIGH (ref 0.61–1.24)
GFR, Estimated: 53 mL/min — ABNORMAL LOW (ref >60)
Glucose, Bld: 133 mg/dL — ABNORMAL HIGH (ref 70–99)
Potassium: 4.2 mmol/L (ref 3.5–5.1)
Sodium: 139 mmol/L (ref 135–145)

## 2022-12-23 LAB — RESP PANEL BY RT-PCR (RSV, FLU A&B, COVID)  RVPGX2
Influenza A by PCR: NEGATIVE
Influenza B by PCR: NEGATIVE
Resp Syncytial Virus by PCR: NEGATIVE
SARS Coronavirus 2 by RT PCR: NEGATIVE

## 2022-12-23 LAB — TROPONIN I (HIGH SENSITIVITY)
Troponin I (High Sensitivity): 356 ng/L (ref <18)
Troponin I (High Sensitivity): 348 ng/L (ref <18)

## 2022-12-23 MED ORDER — SERTRALINE HCL 50 MG PO TABS
150.0000 mg | ORAL_TABLET | Freq: Every day | ORAL | Status: DC
  Administered 2022-12-23 – 2022-12-26 (×4): 150 mg via ORAL
  Filled 2022-12-23: qty 6
  Filled 2022-12-23 (×3): qty 1

## 2022-12-23 MED ORDER — ATORVASTATIN CALCIUM 40 MG PO TABS
40.0000 mg | ORAL_TABLET | Freq: Every day | ORAL | Status: DC
  Administered 2022-12-23 – 2022-12-24 (×2): 40 mg via ORAL
  Filled 2022-12-23 (×2): qty 1

## 2022-12-23 MED ORDER — ASPIRIN 81 MG PO CHEW
324.0000 mg | CHEWABLE_TABLET | Freq: Once | ORAL | Status: AC
  Administered 2022-12-23: 324 mg via ORAL
  Filled 2022-12-23: qty 4

## 2022-12-23 MED ORDER — MIRTAZAPINE 7.5 MG PO TABS
7.5000 mg | ORAL_TABLET | Freq: Every day | ORAL | Status: DC
  Administered 2022-12-24 – 2022-12-25 (×2): 7.5 mg via ORAL
  Filled 2022-12-23 (×4): qty 1

## 2022-12-23 MED ORDER — HEPARIN BOLUS VIA INFUSION
4000.0000 [IU] | Freq: Once | INTRAVENOUS | Status: AC
  Administered 2022-12-23: 4000 [IU] via INTRAVENOUS

## 2022-12-23 MED ORDER — AMLODIPINE BESYLATE 5 MG PO TABS
5.0000 mg | ORAL_TABLET | Freq: Every day | ORAL | Status: DC
  Administered 2022-12-23 – 2022-12-26 (×4): 5 mg via ORAL
  Filled 2022-12-23 (×4): qty 1

## 2022-12-23 MED ORDER — ALPRAZOLAM 0.5 MG PO TABS
0.5000 mg | ORAL_TABLET | Freq: Every evening | ORAL | Status: DC | PRN
  Administered 2022-12-25: 0.5 mg via ORAL
  Filled 2022-12-23: qty 1

## 2022-12-23 MED ORDER — ASPIRIN 81 MG PO TABS: 81.0000 mg | ORAL_TABLET | Freq: Every day | ORAL | Status: DC

## 2022-12-23 MED ORDER — ASPIRIN 81 MG PO TBEC
81.0000 mg | DELAYED_RELEASE_TABLET | Freq: Every day | ORAL | Status: DC
  Administered 2022-12-24 – 2022-12-25 (×2): 81 mg via ORAL
  Filled 2022-12-23 (×2): qty 1

## 2022-12-23 MED ORDER — HEPARIN (PORCINE) 25000 UT/250ML-% IV SOLN
1200.0000 [IU]/h | INTRAVENOUS | Status: DC
  Administered 2022-12-23: 950 [IU]/h via INTRAVENOUS
  Administered 2022-12-25: 1200 [IU]/h via INTRAVENOUS
  Filled 2022-12-23 (×3): qty 250

## 2022-12-23 MED ORDER — RAMIPRIL 5 MG PO CAPS
10.0000 mg | ORAL_CAPSULE | Freq: Every day | ORAL | Status: DC
  Administered 2022-12-23 – 2022-12-24 (×2): 10 mg via ORAL
  Filled 2022-12-23 (×3): qty 2

## 2022-12-23 MED ORDER — FLECAINIDE ACETATE 50 MG PO TABS
50.0000 mg | ORAL_TABLET | Freq: Two times a day (BID) | ORAL | Status: DC
  Administered 2022-12-23: 50 mg via ORAL
  Filled 2022-12-23: qty 1

## 2022-12-23 MED ORDER — IOHEXOL 350 MG/ML SOLN
100.0000 mL | Freq: Once | INTRAVENOUS | Status: AC | PRN
  Administered 2022-12-23: 100 mL via INTRAVENOUS

## 2022-12-23 MED ORDER — CLOPIDOGREL BISULFATE 75 MG PO TABS
75.0000 mg | ORAL_TABLET | Freq: Every day | ORAL | Status: DC
  Administered 2022-12-23 – 2022-12-25 (×3): 75 mg via ORAL
  Filled 2022-12-23 (×4): qty 1

## 2022-12-23 NOTE — ED Notes (Signed)
Patient transported to CT 

## 2022-12-23 NOTE — ED Triage Notes (Signed)
Two days ago experienced CP that radiated into bilateral arms x2 hours. Pain has since gone away and is not currently present.  No previous heart history, nonsmoker, not diabetic. Denies SOB, N/V/D, dizziness.

## 2022-12-23 NOTE — Progress Notes (Signed)
ANTICOAGULATION CONSULT NOTE - Initial Consult  Pharmacy Consult for heparin  Indication: chest pain/ACS  Allergies  Allergen Reactions   Bupropion Other (See Comments)    Unsure about alleries   Valproic Acid Other (See Comments)    Unsure about this    Patient Measurements: Height: 6' (182.9 cm) Weight: 81.6 kg (179 lb 14.3 oz) IBW/kg (Calculated) : 77.6 Heparin Dosing Weight: 81.6kg   Vital Signs: Temp: 98 F (36.7 C) (01/05 1626) Temp Source: Oral (01/05 1626) BP: 136/71 (01/05 1850) Pulse Rate: 64 (01/05 1850)  Labs: Recent Labs    12/23/22 1636 12/23/22 1849  HGB 12.8*  --   HCT 39.1  --   PLT 169  --   CREATININE 1.31*  --   TROPONINIHS 356* 348*    Estimated Creatinine Clearance: 44.4 mL/min (A) (by C-G formula based on SCr of 1.31 mg/dL (H)).   Medical History: Past Medical History:  Diagnosis Date   Abdominal aneurysm [facility])    Depression    History of kidney stones    Hyperlipidemia    Hypertension    Stroke [facility]) 2004?   TIA    Assessment: Patient admitted with CC of chest pain, trop elevation. Not on anticoag PTA. Pharmacy consulted to dose heparin.   Goal of Therapy:  Heparin level 0.3-0.7 units/ml Monitor platelets by anticoagulation protocol: Yes   Plan:  Give 4000 units bolus x 1 Start heparin infusion at 950 units/hr Check anti-Xa level in 8 hours and daily while on heparin Continue to monitor H&H and platelets  Esmeralda Arthur, PharmD, BCCCP  12/23/2022,8:02 PM

## 2022-12-23 NOTE — ED Provider Notes (Signed)
[facility] EMERGENCY DEPT Provider Note   CSN: [id number] Arrival date & time: 12/23/22  1612     History  Chief Complaint  Patient presents with   Chest Pain    Gregory [name] is a 89 y.o. male.  He has a history of stroke and he has a known infrarenal 4 cm AAA that they are watching.  He said on New Year's Eve he experienced about 1 hour or so of substernal chest pain that radiated into both of his biceps.  He took some Tums with improvement in his symptoms.  He talked to his primary care doctor office today and they recommended he come here for evaluation.  No recurrent pain.  He said his wife has the flu but he has not experienced any of the symptoms.  I do not believe he has any known cardiac disease but has a history of hypertension and PVCs and is on flecainide along with Plavix which is likely for his stroke  The history is provided by the patient.  Chest Pain Pain location:  Substernal area Pain quality: aching   Pain radiates to:  L arm and R arm Pain severity:  Moderate Duration:  1 hour Timing:  Constant Progression:  Resolved Chronicity:  New Context: at rest   Relieved by:  Antacids Worsened by:  Nothing Ineffective treatments:  None tried Associated symptoms: no abdominal pain, no back pain, no cough, no diaphoresis, no nausea, no shortness of breath and no vomiting   Risk factors: aortic disease and hypertension        Home Medications Prior to Admission medications   Medication Sig Start Date End Date Taking? Authorizing Provider  ALPRAZolam Prudy Feeler) 0.5 MG tablet Pt takes 1 tab qhs 03/17/17   [provider]  amLODipine (NORVASC) 5 MG tablet Take 5 mg by mouth daily.    [provider]  aspirin 81 MG tablet Take 81 mg by mouth daily.    [provider]  atorvastatin (LIPITOR) 40 MG tablet TAKE 1 TABLET BY MOUTH EVERY DAY TO LOWER CHOLESTEROL. 03/17/17   [provider]  Cholecalciferol (D3 VITAMIN PO) Take  2,000 Int'l Units by mouth daily.    [provider]  clopidogrel (PLAVIX) 75 MG tablet Take 75 mg by mouth daily.    [provider]  flecainide (TAMBOCOR) 50 MG tablet Take 50 mg by mouth 2 (two) times daily.  03/16/17   [provider]  Glucosamine HCl (GLUCOSAMINE PO) Take 1,500 mg by mouth daily.    [provider]  mirtazapine (REMERON) 7.5 MG tablet TAKE 1 TABLET(7.5 MG) BY MOUTH AT BEDTIME 12/15/22   Cottle, Steva Ready., MD  Multiple Vitamins-Minerals (CENTRUM ADULTS PO) Take 1 tablet by mouth daily.     [provider]  Omega-3 Fatty Acids (FISH OIL) 1200 MG CAPS Take 1 capsule by mouth daily.     [provider]  propranolol (INDERAL) 10 MG tablet TK 1-2 TS PO BID AS NEEDED FOR TREMORS Patient not taking: Reported on 10/14/2021 07/31/19   Cottle, Steva Ready., MD  ramipril (ALTACE) 10 MG capsule Take 10 mg by mouth daily.    [provider]  sertraline (ZOLOFT) 50 MG tablet TAKE 3 TABLETS(150 MG) BY MOUTH DAILY 12/15/22   Cottle, Steva Ready., MD  tizanidine (ZANAFLEX) 2 MG capsule Take 1 capsule (2 mg total) by mouth at bedtime as needed for muscle spasms. Patient not taking: Reported on 10/14/2021 05/09/21   Raspet,  Noberto Retort, PA-C      Allergies    Bupropion and Valproic acid    Review of Systems   Review of Systems  Constitutional:  Negative for diaphoresis.  Respiratory:  Negative for cough and shortness of breath.   Cardiovascular:  Positive for chest pain.  Gastrointestinal:  Negative for abdominal pain, nausea and vomiting.  Musculoskeletal:  Negative for back pain.    Physical Exam Updated Vital Signs BP 131/85 (BP Location: Right Arm)   Pulse 68   Temp 98 F (36.7 C) (Oral)   Resp 18   Wt 81.6 kg   SpO2 99%   BMI 24.41 kg/m  Physical Exam Vitals and nursing note reviewed.  Constitutional:      General: He is not in acute distress.    Appearance: Normal appearance. He is well-developed.  HENT:      Head: Normocephalic and atraumatic.  Eyes:     Conjunctiva/sclera: Conjunctivae normal.  Cardiovascular:     Rate and Rhythm: Normal rate and regular rhythm.     Heart sounds: Normal heart sounds. No murmur heard. Pulmonary:     Effort: Pulmonary effort is normal. No respiratory distress.     Breath sounds: Normal breath sounds.  Abdominal:     Palpations: Abdomen is soft. There is no mass.     Tenderness: There is no abdominal tenderness. There is no guarding or rebound.  Musculoskeletal:        General: No swelling. Normal range of motion.     Cervical back: Neck supple.     Right lower leg: No tenderness. No edema.     Left lower leg: No tenderness. No edema.  Skin:    General: Skin is warm and dry.     Capillary Refill: Capillary refill takes less than 2 seconds.  Neurological:     General: No focal deficit present.     Mental Status: He is alert. Mental status is at baseline.     ED Results / Procedures / Treatments   Labs (all labs ordered are listed, but only abnormal results are displayed) Labs Reviewed  BASIC METABOLIC PANEL - Abnormal; Notable for the following components:      Result Value   Glucose, Bld 133 (*)    Creatinine, Ser 1.31 (*)    GFR, Estimated 53 (*)    All other components within normal limits  CBC - Abnormal; Notable for the following components:   RBC 4.11 (*)    Hemoglobin 12.8 (*)    All other components within normal limits  D-DIMER, QUANTITATIVE - Abnormal; Notable for the following components:   D-Dimer, Quant 1.13 (*)    All other components within normal limits  TROPONIN I (HIGH SENSITIVITY) - Abnormal; Notable for the following components:   Troponin I (High Sensitivity) 356 (*)    All other components within normal limits  TROPONIN I (HIGH SENSITIVITY)    EKG EKG Interpretation  Date/Time:  Friday December 23 2022 16:31:32 EST Ventricular Rate:  65 PR Interval:  252 QRS Duration: 118 QT Interval:  422 QTC Calculation: 438 R  Axis:   84 Text Interpretation: Sinus rhythm with 1st degree A-V block Non-specific intra-ventricular conduction delay Nonspecific ST abnormality Abnormal ECG When compared with ECG of 27-Feb-2018 15:02, new nonspecific STs Confirmed by Meridee Score [phone]) on 12/23/2022 4:38:22 PM  Radiology CT Angio Chest/Abd/Pel for Dissection W and/or Wo Contrast  Result Date: 12/23/2022 CLINICAL DATA:  Chest pain. EXAM: CT ANGIOGRAPHY CHEST, ABDOMEN AND PELVIS  TECHNIQUE: Non-contrast CT of the chest was initially obtained. Multidetector CT imaging through the chest, abdomen and pelvis was performed using the standard protocol during bolus administration of intravenous contrast. Multiplanar reconstructed images and MIPs were obtained and reviewed to evaluate the vascular anatomy. RADIATION DOSE REDUCTION: This exam was performed according to the departmental dose-optimization program which includes automated exposure control, adjustment of the mA and/or kV according to patient size and/or use of iterative reconstruction technique. CONTRAST:  OMNIPAQUE IOHEXOL 350 MG/ML SOLN COMPARISON:  April 12, 2018 FINDINGS: CTA CHEST FINDINGS Cardiovascular: There is marked severity calcification of the aortic arch, without evidence of aortic aneurysm or dissection. Satisfactory opacification of the pulmonary arteries to the segmental level. No evidence of pulmonary embolism. Normal heart size with marked severity coronary artery calcification. No pericardial effusion. Mediastinum/Nodes: No enlarged mediastinal, hilar, or axillary lymph nodes. Thyroid gland, trachea, and esophagus demonstrate no significant findings. Lungs/Pleura: A 9 mm anterolateral right upper lobe noncalcified lung nodule is seen. There is no evidence of an acute infiltrate, pleural effusion or pneumothorax. Musculoskeletal: No chest wall abnormality. No acute or significant osseous findings. Review of the MIP images confirms the above findings. CTA ABDOMEN  AND PELVIS FINDINGS VASCULAR Aorta: 4.0 cm x 3.6 cm aneurysmal dilatation of the infrarenal abdominal aorta without evidence of dissection, vasculitis or significant stenosis. Celiac: Patent without evidence of aneurysm, dissection, vasculitis or significant stenosis. SMA: Patent without evidence of aneurysm, dissection, vasculitis or significant stenosis. Renals: Both renal arteries are patent without evidence of aneurysm, dissection, vasculitis, fibromuscular dysplasia or significant stenosis. IMA: Patent without evidence of aneurysm, dissection, vasculitis or significant stenosis. Inflow: Patent without evidence of aneurysm, dissection, vasculitis or significant stenosis. Veins: No obvious venous abnormality within the limitations of this arterial phase study. Review of the MIP images confirms the above findings. NON-VASCULAR Hepatobiliary: No focal liver abnormality is seen. No gallstones, gallbladder wall thickening, or biliary dilatation. Pancreas: Unremarkable. No pancreatic ductal dilatation or surrounding inflammatory changes. Spleen: Normal in size without focal abnormality. Adrenals/Urinary Tract: Adrenal glands are unremarkable. Kidneys are normal, without renal calculi, focal lesion, or hydronephrosis. Bladder is unremarkable. Stomach/Bowel: There is a small hiatal hernia. Surgically anastomosed bowel is seen within the anterior aspect of the mid to lower right abdomen. No evidence of bowel wall thickening, distention, or inflammatory changes. Noninflamed diverticula are seen throughout the sigmoid colon. Lymphatic: No abnormal abdominal or pelvic lymph nodes are identified. Reproductive: Prostate gland is mildly enlarged. Other: No abdominal wall hernia or abnormality. No abdominopelvic ascites. Musculoskeletal: No acute or significant osseous findings. Review of the MIP images confirms the above findings. IMPRESSION: 1. No evidence of pulmonary embolism or other acute intrathoracic process. 2. 9 mm  anterolateral right upper lobe noncalcified lung nodule. Consider one of the following in 3 months for both low-risk and high-risk individuals: (a) repeat chest CT, (b) follow-up PET-CT, or (c) tissue sampling. This recommendation follows the consensus statement: Guidelines for Management of Incidental Pulmonary Nodules Detected on CT Images: From the Fleischner Society 2017; Radiology 2017; 284:228-243. 3. 4.0 cm x 3.6 cm infrarenal abdominal aortic aneurysm. 4. Sigmoid diverticulosis. 5. Small hiatal hernia. 6. Aortic atherosclerosis. Aortic Atherosclerosis (ICD10-I70.0). Electronically Signed   By: Aram Candela M.D.   On: 12/23/2022 18:56   DG Chest Port 1 View  Result Date: 12/23/2022 CLINICAL DATA:  Chest pain EXAM: PORTABLE CHEST 1 VIEW COMPARISON:  05/09/2021 FINDINGS: The heart size and mediastinal contours are within normal limits. Spiculated appearing nodular opacity projecting over the  right pulmonary apex. No acute appearing airspace opacity. Small, definitively benign calcified nodules of the left upper lobe. The visualized skeletal structures are unremarkable. IMPRESSION: 1. No acute appearing airspace opacity. 2. Spiculated appearing nodular opacity projecting over the right pulmonary apex. This is possibly a rib end calcification or other non parenchymal finding; recommend nonemergent CT to further evaluate. Electronically Signed   By: Jearld Lesch M.D.   On: 12/23/2022 17:03    Procedures .Critical Care  Performed by: Terrilee Files, MD Authorized by: Terrilee Files, MD   Critical care provider statement:    Critical care time (minutes):  45   Critical care time was exclusive of:  Separately billable procedures and treating other patients   Critical care was necessary to treat or prevent imminent or life-threatening deterioration of the following conditions:  Cardiac failure   Critical care was time spent personally by me on the following activities:  Development of  treatment plan with patient or surrogate, discussions with consultants, evaluation of patient's response to treatment, examination of patient, obtaining history from patient or surrogate, ordering and performing treatments and interventions, ordering and review of laboratory studies, ordering and review of radiographic studies, pulse oximetry, re-evaluation of patient's condition and review of old charts   I assumed direction of critical care for this patient from another provider in my specialty: no       Medications Ordered in ED Medications  heparin ADULT infusion 100 units/mL (25000 units/276mL) (1,000 Units/hr Intravenous Rate/Dose Verify 12/24/22 0825)  ALPRAZolam (XANAX) tablet 0.5 mg (has no administration in time range)  amLODipine (NORVASC) tablet 5 mg (5 mg Oral Given 12/23/22 2148)  atorvastatin (LIPITOR) tablet 40 mg (40 mg Oral Given 12/23/22 2148)  clopidogrel (PLAVIX) tablet 75 mg (75 mg Oral Given 12/23/22 2148)  flecainide (TAMBOCOR) tablet 50 mg (50 mg Oral Given 12/23/22 2148)  mirtazapine (REMERON) tablet 7.5 mg (has no administration in time range)  ramipril (ALTACE) capsule 10 mg (10 mg Oral Given 12/23/22 2148)  sertraline (ZOLOFT) tablet 150 mg (150 mg Oral Given 12/23/22 2148)  aspirin EC tablet 81 mg (has no administration in time range)  aspirin chewable tablet 324 mg (324 mg Oral Given 12/23/22 1827)  iohexol (OMNIPAQUE) 350 MG/ML injection 100 mL (100 mLs Intravenous Contrast Given 12/23/22 1838)  heparin bolus via infusion 4,000 Units (4,000 Units Intravenous Bolus from Bag 12/23/22 2028)    ED Course/ Medical Decision Making/ A&P Clinical Course as of 12/24/22 1028  Fri Dec 23, 2022  1927 Discussed with cardiology Dr. Kirtland Bouchard.  He is recommending admission to [facility]. cardiology service telemetry heparin drip. [MB]  1930 Discussed with patient and son.  I let them know about cardiology recommendations and they are comfortable plan for admission. [MB]    Clinical Course User  Index [MB] Terrilee Files, MD                           Medical Decision Making Risk OTC drugs. Prescription drug management. Decision regarding hospitalization.   This patient complains of chest pain radiating to arms; this involves an extensive number of treatment Options and is a complaint that carries with it a high risk of complications and morbidity. The differential includes ACS, PE, pneumothorax, musculoskeletal, reflux, vascular  I ordered, reviewed and interpreted labs, which included CBC with normal white count, hemoglobin slightly down from priors, chemistries with some chronic CKD, D-dimer elevated, troponins elevated need to be trended, COVID  and flu negative I ordered medication aspirin IV heparin and reviewed PMP when indicated. I ordered imaging studies which included chest x-ray and CT angio chest abdomen and pelvis and I independently    visualized and interpreted imaging which showed no PE.  Does have lung nodule does have stable infrarenal aneurysm Additional history obtained from patient's son Previous records obtained and reviewed in epic no recent admissions I consulted Dr. Antoine Poche and discussed lab and imaging findings and discussed disposition.  Cardiac monitoring reviewed, normal sinus rhythm Social determinants considered, no significant barriers Critical Interventions: Initiation of IV heparin for possible ACS  After the interventions stated above, I reevaluated the patient and found patient to be pain-free in no distress at this time Admission and further testing considered, he will need admission to the hospital for further workup.  Patient in agreement with plan for admission.         Final Clinical Impression(s) / ED Diagnoses Final diagnoses:  NSTEMI (non-ST elevated myocardial infarction) (HCC)  Infrarenal abdominal aortic aneurysm (AAA) without rupture [facility])  Pulmonary nodule    Rx / DC Orders ED Discharge Orders     None          Terrilee Files, MD 12/24/22 1032

## 2022-12-23 NOTE — ED Provider Triage Note (Signed)
Emergency Medicine Provider Triage Evaluation Note  Gregory [name] , a 89 y.o. male  was evaluated in triage.  Pt complains of chest pain that radiated to bilateral arms on 1/3x2 hours. No current or recurrent chest pain. States he was texting a lot that day. No SOB, N/V, or dizziness with episode.   No hx of HTN, DMII  Review of Systems  Positive: Chest pain Negative: SOB  Physical Exam  BP 131/85 (BP Location: Right Arm)   Pulse 68   Temp 98 F (36.7 C) (Oral)   Resp 18   Wt 81.6 kg   SpO2 99%   BMI 24.41 kg/m  Gen:   Awake, no distress   Resp:  Normal effort  MSK:   Moves extremities without difficulty   Medical Decision Making  Medically screening exam initiated at 4:27 PM.  Appropriate orders placed.  Gregory [name] was informed that the remainder of the evaluation will be completed by another provider, this initial triage assessment does not replace that evaluation, and the importance of remaining in the ED until their evaluation is complete.     Gregory [name], [place] 12/23/22 1627

## 2022-12-23 NOTE — ED Notes (Signed)
Ambulated to restroom  

## 2022-12-24 ENCOUNTER — Observation Stay: Payer: Self-pay

## 2022-12-24 ENCOUNTER — Inpatient Hospital Stay (HOSPITAL_COMMUNITY): Payer: PPO

## 2022-12-24 DIAGNOSIS — I440 Atrioventricular block, first degree: Secondary | ICD-10-CM | POA: Diagnosis present

## 2022-12-24 DIAGNOSIS — I7143 Infrarenal abdominal aortic aneurysm, without rupture: Secondary | ICD-10-CM | POA: Diagnosis not present

## 2022-12-24 DIAGNOSIS — N189 Chronic kidney disease, unspecified: Secondary | ICD-10-CM | POA: Diagnosis not present

## 2022-12-24 DIAGNOSIS — I2510 Atherosclerotic heart disease of native coronary artery without angina pectoris: Secondary | ICD-10-CM | POA: Diagnosis not present

## 2022-12-24 DIAGNOSIS — Z7902 Long term (current) use of antithrombotics/antiplatelets: Secondary | ICD-10-CM | POA: Diagnosis not present

## 2022-12-24 DIAGNOSIS — E7800 Pure hypercholesterolemia, unspecified: Secondary | ICD-10-CM

## 2022-12-24 DIAGNOSIS — F32A Depression, unspecified: Secondary | ICD-10-CM | POA: Diagnosis not present

## 2022-12-24 DIAGNOSIS — I209 Angina pectoris, unspecified: Secondary | ICD-10-CM | POA: Diagnosis present

## 2022-12-24 DIAGNOSIS — R911 Solitary pulmonary nodule: Secondary | ICD-10-CM | POA: Diagnosis present

## 2022-12-24 DIAGNOSIS — Z888 Allergy status to other drugs, medicaments and biological substances status: Secondary | ICD-10-CM | POA: Diagnosis not present

## 2022-12-24 DIAGNOSIS — I25110 Atherosclerotic heart disease of native coronary artery with unstable angina pectoris: Secondary | ICD-10-CM | POA: Diagnosis not present

## 2022-12-24 DIAGNOSIS — R079 Chest pain, unspecified: Secondary | ICD-10-CM | POA: Diagnosis not present

## 2022-12-24 DIAGNOSIS — I214 Non-ST elevation (NSTEMI) myocardial infarction: Principal | ICD-10-CM

## 2022-12-24 DIAGNOSIS — I129 Hypertensive chronic kidney disease with stage 1 through stage 4 chronic kidney disease, or unspecified chronic kidney disease: Secondary | ICD-10-CM | POA: Diagnosis not present

## 2022-12-24 DIAGNOSIS — Z809 Family history of malignant neoplasm, unspecified: Secondary | ICD-10-CM | POA: Diagnosis not present

## 2022-12-24 DIAGNOSIS — I10 Essential (primary) hypertension: Secondary | ICD-10-CM | POA: Diagnosis not present

## 2022-12-24 DIAGNOSIS — Z8249 Family history of ischemic heart disease and other diseases of the circulatory system: Secondary | ICD-10-CM | POA: Diagnosis not present

## 2022-12-24 DIAGNOSIS — Z79899 Other long term (current) drug therapy: Secondary | ICD-10-CM | POA: Diagnosis not present

## 2022-12-24 DIAGNOSIS — N179 Acute kidney failure, unspecified: Secondary | ICD-10-CM | POA: Diagnosis not present

## 2022-12-24 DIAGNOSIS — I700 Atherosclerosis of aorta: Secondary | ICD-10-CM | POA: Diagnosis present

## 2022-12-24 DIAGNOSIS — Z1152 Encounter for screening for COVID-19: Secondary | ICD-10-CM | POA: Diagnosis not present

## 2022-12-24 DIAGNOSIS — Z87891 Personal history of nicotine dependence: Secondary | ICD-10-CM | POA: Diagnosis not present

## 2022-12-24 DIAGNOSIS — E785 Hyperlipidemia, unspecified: Secondary | ICD-10-CM | POA: Diagnosis not present

## 2022-12-24 DIAGNOSIS — Z7982 Long term (current) use of aspirin: Secondary | ICD-10-CM | POA: Diagnosis not present

## 2022-12-24 DIAGNOSIS — I200 Unstable angina: Secondary | ICD-10-CM | POA: Diagnosis present

## 2022-12-24 DIAGNOSIS — Z8673 Personal history of transient ischemic attack (TIA), and cerebral infarction without residual deficits: Secondary | ICD-10-CM

## 2022-12-24 LAB — CBC WITH DIFFERENTIAL/PLATELET
Abs Immature Granulocytes: 0.01 10*3/uL (ref 0.00–0.07)
Basophils Absolute: 0 10*3/uL (ref 0.0–0.1)
Basophils Relative: 0 %
Eosinophils Absolute: 0.2 10*3/uL (ref 0.0–0.5)
Eosinophils Relative: 3 %
HCT: 35.6 % — ABNORMAL LOW (ref 39.0–52.0)
Hemoglobin: 11.6 g/dL — ABNORMAL LOW (ref 13.0–17.0)
Immature Granulocytes: 0 %
Lymphocytes Relative: 20 %
Lymphs Abs: 1.4 10*3/uL (ref 0.7–4.0)
MCH: 30.8 pg (ref 26.0–34.0)
MCHC: 32.6 g/dL (ref 30.0–36.0)
MCV: 94.4 fL (ref 80.0–100.0)
Monocytes Absolute: 0.5 10*3/uL (ref 0.1–1.0)
Monocytes Relative: 8 %
Neutro Abs: 4.8 10*3/uL (ref 1.7–7.7)
Neutrophils Relative %: 69 %
Platelets: 164 10*3/uL (ref 150–400)
RBC: 3.77 MIL/uL — ABNORMAL LOW (ref 4.22–5.81)
RDW: 12.5 % (ref 11.5–15.5)
WBC: 7 10*3/uL (ref 4.0–10.5)
nRBC: 0 % (ref 0.0–0.2)

## 2022-12-24 LAB — BASIC METABOLIC PANEL
Anion gap: 8 (ref 5–15)
BUN: 14 mg/dL (ref 8–23)
CO2: 23 mmol/L (ref 22–32)
Calcium: 8.8 mg/dL — ABNORMAL LOW (ref 8.9–10.3)
Chloride: 102 mmol/L (ref 98–111)
Creatinine, Ser: 1.39 mg/dL — ABNORMAL HIGH (ref 0.61–1.24)
GFR, Estimated: 49 mL/min — ABNORMAL LOW (ref >60)
Glucose, Bld: 188 mg/dL — ABNORMAL HIGH (ref 70–99)
Potassium: 4.2 mmol/L (ref 3.5–5.1)
Sodium: 133 mmol/L — ABNORMAL LOW (ref 135–145)

## 2022-12-24 LAB — ECHOCARDIOGRAM COMPLETE
AR max vel: 2.09 cm2
AV Area VTI: 2.1 cm2
AV Area mean vel: 1.98 cm2
AV Mean grad: 5.3 mmHg
AV Peak grad: 9.6 mmHg
Ao pk vel: 1.55 m/s
Area-P 1/2: 4.8 cm2
Calc EF: 47.9 %
Height: 72 in
MV M vel: 1.47 m/s
MV Peak grad: 8.6 mmHg
S' Lateral: 3.3 cm
Single Plane A2C EF: 30.5 %
Single Plane A4C EF: 53.6 %
Weight: 2878.33 oz

## 2022-12-24 LAB — HEPARIN LEVEL (UNFRACTIONATED): Heparin Unfractionated: 0.3 IU/mL (ref 0.30–0.70)

## 2022-12-24 LAB — TROPONIN I (HIGH SENSITIVITY)
Troponin I (High Sensitivity): 155 ng/L (ref <18)
Troponin I (High Sensitivity): 191 ng/L (ref <18)

## 2022-12-24 LAB — BRAIN NATRIURETIC PEPTIDE: B Natriuretic Peptide: 326.2 pg/mL — ABNORMAL HIGH (ref 0.0–100.0)

## 2022-12-24 MED ORDER — SODIUM CHLORIDE 0.9% FLUSH
3.0000 mL | Freq: Two times a day (BID) | INTRAVENOUS | Status: DC
  Administered 2022-12-24 – 2022-12-25 (×2): 3 mL via INTRAVENOUS

## 2022-12-24 MED ORDER — ACETAMINOPHEN 325 MG PO TABS: 650.0000 mg | ORAL_TABLET | ORAL | Status: DC | PRN

## 2022-12-24 MED ORDER — NITROGLYCERIN 0.4 MG SL SUBL: 0.4000 mg | SUBLINGUAL_TABLET | SUBLINGUAL | Status: DC | PRN

## 2022-12-24 MED ORDER — ATORVASTATIN CALCIUM 80 MG PO TABS
80.0000 mg | ORAL_TABLET | Freq: Every day | ORAL | Status: DC
  Administered 2022-12-25: 80 mg via ORAL
  Filled 2022-12-24 (×2): qty 1

## 2022-12-24 MED ORDER — ONDANSETRON HCL 4 MG/2ML IJ SOLN: 4.0000 mg | Freq: Four times a day (QID) | INTRAMUSCULAR | Status: DC | PRN

## 2022-12-24 MED ORDER — ASPIRIN 81 MG PO TBEC: 81.0000 mg | DELAYED_RELEASE_TABLET | Freq: Every day | ORAL | Status: DC

## 2022-12-24 MED ORDER — PERFLUTREN LIPID MICROSPHERE
1.0000 mL | INTRAVENOUS | Status: AC | PRN
  Administered 2022-12-24: 2 mL via INTRAVENOUS

## 2022-12-24 NOTE — ED Notes (Signed)
ED TO INPATIENT HANDOFF REPORT  ED Nurse Name and Phone #: Lew Dawes RN   S Name/Age/Gender Gregory [name] 89 y.o. male Room/Bed: 007C/007C  Code Status   Code Status: Not on file  Home/SNF/Other Home Patient oriented to: self, place, time, and situation Is this baseline? Yes   Triage Complete: Triage complete  Chief Complaint NSTEMI (non-ST elevated myocardial infarction) [facility]) [I21.4]  Triage Note Two days ago experienced CP that radiated into bilateral arms x2 hours. Pain has since gone away and is not currently present.  No previous heart history, nonsmoker, not diabetic. Denies SOB, N/V/D, dizziness.    Allergies Allergies  Allergen Reactions   Bupropion Other (See Comments)    Unsure about alleries   Valproic Acid Other (See Comments)    Unsure about this    Level of Care/Admitting Diagnosis ED Disposition     ED Disposition  Admit   Condition  --   Comment  Hospital Area: [facility] [[id number]]  Level of Care: Telemetry Cardiac [[id number]]  Interfacility transfer: Yes  May place patient in observation at [facility] or [facility] if equivalent level of care is available:: No  Covid Evaluation: Asymptomatic - no recent exposure (last 10 days) testing not required  Diagnosis: NSTEMI (non-ST elevated myocardial infarction) [facility]) [[id number]]  Admitting Physician: Rollene Rotunda [id number]  Attending Physician: Rollene Rotunda [phone]          B Medical/Surgery History Past Medical History:  Diagnosis Date   Abdominal aneurysm (HCC)    Depression    History of kidney stones    Hyperlipidemia    Hypertension    Stroke [facility]) 2004?   TIA   Past Surgical History:  Procedure Laterality Date   COLON SURGERY     polyps removed   COLONOSCOPY     EYE SURGERY     cataract right   IR ANGIO INTRA EXTRACRAN SEL COM CAROTID INNOMINATE BILAT MOD SED  02/28/2018   IR ANGIO VERTEBRAL SEL SUBCLAVIAN INNOMINATE UNI R MOD SED  02/28/2018   IR  RADIOLOGIST EVAL & MGMT  02/12/2021   OTHER SURGICAL HISTORY     Carotid    RADIOLOGY WITH ANESTHESIA N/A 02/28/2018   Procedure: STENTING;  Surgeon: Julieanne Cotton, MD;  Location: [facility] OR;  Service: Radiology;  Laterality: N/A;     A IV Location/Drains/Wounds Patient Lines/Drains/Airways Status     Active Line/Drains/Airways     Name Placement date Placement time Site Days   Peripheral IV 12/23/22 20 G 1" Left Antecubital 12/23/22  1757  Antecubital  1   Peripheral IV 12/24/22 20 G 1" Right;Lateral Forearm 12/24/22  0700  Forearm  less than 1   Incision (Closed) 02/28/18 Groin Right 02/28/18  1100  -- 1760            Intake/Output Last 24 hours  Intake/Output Summary (Last 24 hours) at 12/24/2022 0925 Last data filed at 12/24/2022 0825 Gross per 24 hour  Intake --  Output 325 ml  Net -325 ml    Labs/Imaging Results for orders placed or performed during the hospital encounter of 12/23/22 (from the past 48 hour(s))  Basic metabolic panel     Status: Abnormal   Collection Time: 12/23/22  4:36 PM  Result Value Ref Range   Sodium 139 135 - 145 mmol/L   Potassium 4.2 3.5 - 5.1 mmol/L   Chloride 102 98 - 111 mmol/L   CO2 26 22 - 32 mmol/L   Glucose, Bld 133 (  H) 70 - 99 mg/dL    Comment: Glucose reference range applies only to samples taken after fasting for at least 8 hours.   BUN 16 8 - 23 mg/dL   Creatinine, Ser 2.99 (H) 0.61 - 1.24 mg/dL   Calcium 37.1 8.9 - 69.6 mg/dL   GFR, Estimated 53 (L) >60 mL/min    Comment: (NOTE) Calculated using the CKD-EPI Creatinine Equation (2021)    Anion gap 11 5 - 15    Comment: Performed at [facility], [address], [place], [place] [address]  CBC     Status: Abnormal   Collection Time: 12/23/22  4:36 PM  Result Value Ref Range   WBC 7.5 4.0 - 10.5 K/uL   RBC 4.11 (L) 4.22 - 5.81 MIL/uL   Hemoglobin 12.8 (L) 13.0 - 17.0 g/dL   HCT 10.1 75.1 - 02.5 %   MCV 95.1 80.0 - 100.0 fL   MCH 31.1 26.0 - 34.0 pg    MCHC 32.7 30.0 - 36.0 g/dL   RDW 85.2 77.8 - 24.2 %   Platelets 169 150 - 400 K/uL   nRBC 0.0 0.0 - 0.2 %    Comment: Performed at [facility], [address] [place], [place] [address]  Troponin I (High Sensitivity)     Status: Abnormal   Collection Time: 12/23/22  4:36 PM  Result Value Ref Range   Troponin I (High Sensitivity) 356 (HH) <18 ng/L    Comment: CRITICAL RESULT CALLED TO, READ BACK BY AND VERIFIED WITH: Wallie Renshaw 1742 12/23/2022 DBRADLEY (NOTE) Elevated high sensitivity troponin I (hsTnI) values and significant  changes across serial measurements may suggest ACS but many other  chronic and acute conditions are known to elevate hsTnI results.  Refer to the Links section for chest pain algorithms and additional  guidance. Performed at [facility], [address], [place], [place] [address]   D-dimer, quantitative     Status: Abnormal   Collection Time: 12/23/22  4:36 PM  Result Value Ref Range   D-Dimer, Quant 1.13 (H) 0.00 - 0.50 ug/mL-FEU    Comment: (NOTE) At the manufacturer cut-off value of 0.5 g/mL FEU, this assay has a negative predictive value of 95-100%.This assay is intended for use in conjunction with a clinical pretest probability (PTP) assessment model to exclude pulmonary embolism (PE) and deep venous thrombosis (DVT) in outpatients suspected of PE or DVT. Results should be correlated with clinical presentation. Performed at [facility], [address] [place], [place] [address]   Troponin I (High Sensitivity)     Status: Abnormal   Collection Time: 12/23/22  6:49 PM  Result Value Ref Range   Troponin I (High Sensitivity) 348 (HH) <18 ng/L    Comment: CRITICAL VALUE NOTED.  VALUE IS CONSISTENT WITH PREVIOUSLY REPORTED AND CALLED VALUE. (NOTE) Elevated high sensitivity troponin I (hsTnI) values and significant  changes across serial measurements may suggest ACS but many other  chronic  and acute conditions are known to elevate hsTnI results.  Refer to the Links section for chest pain algorithms and additional  guidance. Performed at [facility], [address] [place], [place] [address]   Resp panel by RT-PCR (RSV, Flu A&B, Covid) Anterior Nasal Swab     Status: None   Collection Time: 12/23/22  6:49 PM   Specimen: Anterior Nasal Swab  Result Value Ref Range   SARS Coronavirus 2 by RT PCR NEGATIVE NEGATIVE    Comment: (NOTE) SARS-CoV-2 target nucleic  acids are NOT DETECTED.  The SARS-CoV-2 RNA is generally detectable in upper respiratory specimens during the acute phase of infection. The lowest concentration of SARS-CoV-2 viral copies this assay can detect is 138 copies/mL. A negative result does not preclude SARS-Cov-2 infection and should not be used as the sole basis for treatment or other patient management decisions. A negative result may occur with  improper specimen collection/handling, submission of specimen other than nasopharyngeal swab, presence of viral mutation(s) within the areas targeted by this assay, and inadequate number of viral copies(<138 copies/mL). A negative result must be combined with clinical observations, patient history, and epidemiological information. The expected result is Negative.  Fact Sheet for Patients:  BloggerCourse.com  Fact Sheet for Healthcare Providers:  SeriousBroker.it  This test is no t yet approved or cleared by the [place] FDA and  has been authorized for detection and/or diagnosis of SARS-CoV-2 by FDA under an Emergency Use Authorization (EUA). This EUA will remain  in effect (meaning this test can be used) for the duration of the COVID-19 declaration under Section 564(b)(1) of the Act, 21 U.S.C.section 360bbb-3(b)(1), unless the authorization is terminated  or revoked sooner.       Influenza A by PCR NEGATIVE NEGATIVE   Influenza  B by PCR NEGATIVE NEGATIVE    Comment: (NOTE) The Xpert Xpress SARS-CoV-2/FLU/RSV plus assay is intended as an aid in the diagnosis of influenza from Nasopharyngeal swab specimens and should not be used as a sole basis for treatment. Nasal washings and aspirates are unacceptable for Xpert Xpress SARS-CoV-2/FLU/RSV testing.  Fact Sheet for Patients: BloggerCourse.com  Fact Sheet for Healthcare Providers: SeriousBroker.it  This test is not yet approved or cleared by the [place] FDA and has been authorized for detection and/or diagnosis of SARS-CoV-2 by FDA under an Emergency Use Authorization (EUA). This EUA will remain in effect (meaning this test can be used) for the duration of the COVID-19 declaration under Section 564(b)(1) of the Act, 21 U.S.C. section 360bbb-3(b)(1), unless the authorization is terminated or revoked.     Resp Syncytial Virus by PCR NEGATIVE NEGATIVE    Comment: (NOTE) Fact Sheet for Patients: BloggerCourse.com  Fact Sheet for Healthcare Providers: SeriousBroker.it  This test is not yet approved or cleared by the [place] FDA and has been authorized for detection and/or diagnosis of SARS-CoV-2 by FDA under an Emergency Use Authorization (EUA). This EUA will remain in effect (meaning this test can be used) for the duration of the COVID-19 declaration under Section 564(b)(1) of the Act, 21 U.S.C. section 360bbb-3(b)(1), unless the authorization is terminated or revoked.  Performed at [facility], [address] [place], [place] [address]   CBC     Status: Abnormal   Collection Time: 12/23/22  8:30 PM  Result Value Ref Range   WBC 7.0 4.0 - 10.5 K/uL   RBC 3.96 (L) 4.22 - 5.81 MIL/uL   Hemoglobin 12.5 (L) 13.0 - 17.0 g/dL   HCT 81.8 (L) 29.9 - 37.1 %   MCV 91.9 80.0 - 100.0 fL   MCH 31.6 26.0 - 34.0 pg   MCHC 34.3 30.0  - 36.0 g/dL   RDW 69.6 78.9 - 38.1 %   Platelets 174 150 - 400 K/uL   nRBC 0.0 0.0 - 0.2 %    Comment: Performed at [facility], [address] [place], [place] [address]  Heparin level (unfractionated)     Status: None   Collection Time: 12/24/22  5:10 AM  Result  Value Ref Range   Heparin Unfractionated 0.30 0.30 - 0.70 IU/mL    Comment: (NOTE) The clinical reportable range upper limit is being lowered to >1.10 to align with the FDA approved guidance for the current laboratory assay.  If heparin results are below expected values, and patient dosage has  been confirmed, suggest follow up testing of antithrombin III levels. Performed at [facility], [address] [place], [place] [address]    CT Angio Chest/Abd/Pel for Dissection W and/or Wo Contrast  Result Date: 12/23/2022 CLINICAL DATA:  Chest pain. EXAM: CT ANGIOGRAPHY CHEST, ABDOMEN AND PELVIS TECHNIQUE: Non-contrast CT of the chest was initially obtained. Multidetector CT imaging through the chest, abdomen and pelvis was performed using the standard protocol during bolus administration of intravenous contrast. Multiplanar reconstructed images and MIPs were obtained and reviewed to evaluate the vascular anatomy. RADIATION DOSE REDUCTION: This exam was performed according to the departmental dose-optimization program which includes automated exposure control, adjustment of the mA and/or kV according to patient size and/or use of iterative reconstruction technique. CONTRAST:  OMNIPAQUE IOHEXOL 350 MG/ML SOLN COMPARISON:  April 12, 2018 FINDINGS: CTA CHEST FINDINGS Cardiovascular: There is marked severity calcification of the aortic arch, without evidence of aortic aneurysm or dissection. Satisfactory opacification of the pulmonary arteries to the segmental level. No evidence of pulmonary embolism. Normal heart size with marked severity coronary artery calcification. No pericardial effusion.  Mediastinum/Nodes: No enlarged mediastinal, hilar, or axillary lymph nodes. Thyroid gland, trachea, and esophagus demonstrate no significant findings. Lungs/Pleura: A 9 mm anterolateral right upper lobe noncalcified lung nodule is seen. There is no evidence of an acute infiltrate, pleural effusion or pneumothorax. Musculoskeletal: No chest wall abnormality. No acute or significant osseous findings. Review of the MIP images confirms the above findings. CTA ABDOMEN AND PELVIS FINDINGS VASCULAR Aorta: 4.0 cm x 3.6 cm aneurysmal dilatation of the infrarenal abdominal aorta without evidence of dissection, vasculitis or significant stenosis. Celiac: Patent without evidence of aneurysm, dissection, vasculitis or significant stenosis. SMA: Patent without evidence of aneurysm, dissection, vasculitis or significant stenosis. Renals: Both renal arteries are patent without evidence of aneurysm, dissection, vasculitis, fibromuscular dysplasia or significant stenosis. IMA: Patent without evidence of aneurysm, dissection, vasculitis or significant stenosis. Inflow: Patent without evidence of aneurysm, dissection, vasculitis or significant stenosis. Veins: No obvious venous abnormality within the limitations of this arterial phase study. Review of the MIP images confirms the above findings. NON-VASCULAR Hepatobiliary: No focal liver abnormality is seen. No gallstones, gallbladder wall thickening, or biliary dilatation. Pancreas: Unremarkable. No pancreatic ductal dilatation or surrounding inflammatory changes. Spleen: Normal in size without focal abnormality. Adrenals/Urinary Tract: Adrenal glands are unremarkable. Kidneys are normal, without renal calculi, focal lesion, or hydronephrosis. Bladder is unremarkable. Stomach/Bowel: There is a small hiatal hernia. Surgically anastomosed bowel is seen within the anterior aspect of the mid to lower right abdomen. No evidence of bowel wall thickening, distention, or inflammatory changes.  Noninflamed diverticula are seen throughout the sigmoid colon. Lymphatic: No abnormal abdominal or pelvic lymph nodes are identified. Reproductive: Prostate gland is mildly enlarged. Other: No abdominal wall hernia or abnormality. No abdominopelvic ascites. Musculoskeletal: No acute or significant osseous findings. Review of the MIP images confirms the above findings. IMPRESSION: 1. No evidence of pulmonary embolism or other acute intrathoracic process. 2. 9 mm anterolateral right upper lobe noncalcified lung nodule. Consider one of the following in 3 months for both low-risk and high-risk individuals: (a) repeat chest CT, (b) follow-up PET-CT, or (c) tissue sampling.  This recommendation follows the consensus statement: Guidelines for Management of Incidental Pulmonary Nodules Detected on CT Images: From the Fleischner Society 2017; Radiology 2017; 284:228-243. 3. 4.0 cm x 3.6 cm infrarenal abdominal aortic aneurysm. 4. Sigmoid diverticulosis. 5. Small hiatal hernia. 6. Aortic atherosclerosis. Aortic Atherosclerosis (ICD10-I70.0). Electronically Signed   By: Virgina Norfolk M.D.   On: 12/23/2022 18:56   DG Chest Port 1 View  Result Date: 12/23/2022 CLINICAL DATA:  Chest pain EXAM: PORTABLE CHEST 1 VIEW COMPARISON:  05/09/2021 FINDINGS: The heart size and mediastinal contours are within normal limits. Spiculated appearing nodular opacity projecting over the right pulmonary apex. No acute appearing airspace opacity. Small, definitively benign calcified nodules of the left upper lobe. The visualized skeletal structures are unremarkable. IMPRESSION: 1. No acute appearing airspace opacity. 2. Spiculated appearing nodular opacity projecting over the right pulmonary apex. This is possibly a rib end calcification or other non parenchymal finding; recommend nonemergent CT to further evaluate. Electronically Signed   By: Delanna Ahmadi M.D.   On: 12/23/2022 17:03    Pending Labs Unresulted Labs (From admission, onward)     None       Vitals/Pain Today's Vitals   12/24/22 0815 12/24/22 0820 12/24/22 0824 12/24/22 0825  BP:    (!) 121/94  Pulse: (!) 59 64 62 63  Resp:    20  Temp:    98.5 F (36.9 C)  TempSrc:    Oral  SpO2: 95% 94% 95% 95%  Weight:      Height:      PainSc:    0-No pain    Isolation Precautions No active isolations  Medications Medications  heparin ADULT infusion 100 units/mL (25000 units/273mL) (1,000 Units/hr Intravenous Rate/Dose Verify 12/24/22 0825)  ALPRAZolam (XANAX) tablet 0.5 mg (has no administration in time range)  amLODipine (NORVASC) tablet 5 mg (5 mg Oral Given 12/23/22 2148)  atorvastatin (LIPITOR) tablet 40 mg (40 mg Oral Given 12/23/22 2148)  clopidogrel (PLAVIX) tablet 75 mg (75 mg Oral Given 12/23/22 2148)  flecainide (TAMBOCOR) tablet 50 mg (50 mg Oral Given 12/23/22 2148)  mirtazapine (REMERON) tablet 7.5 mg (has no administration in time range)  ramipril (ALTACE) capsule 10 mg (10 mg Oral Given 12/23/22 2148)  sertraline (ZOLOFT) tablet 150 mg (150 mg Oral Given 12/23/22 2148)  aspirin EC tablet 81 mg (has no administration in time range)  aspirin chewable tablet 324 mg (324 mg Oral Given 12/23/22 1827)  iohexol (OMNIPAQUE) 350 MG/ML injection 100 mL (100 mLs Intravenous Contrast Given 12/23/22 1838)  heparin bolus via infusion 4,000 Units (4,000 Units Intravenous Bolus from Bag 12/23/22 2028)    Mobility walks Low fall risk   Focused Assessments    R Recommendations: See Admitting Provider Note  Report given to:   Additional Notes

## 2022-12-24 NOTE — ED Notes (Signed)
[facility] arrives to [facility]

## 2022-12-24 NOTE — Progress Notes (Signed)
ANTICOAGULATION CONSULT NOTE - Follow Up Consult  Pharmacy Consult for heparin Indication:  NSTEMI  Labs: Recent Labs    12/23/22 1636 12/23/22 1849 12/23/22 2030 12/24/22 0510  HGB 12.8*  --  12.5*  --   HCT 39.1  --  36.4*  --   PLT 169  --  174  --   HEPARINUNFRC  --   --   --  0.30  CREATININE 1.31*  --   --   --   TROPONINIHS 356* 348*  --   --     Assessment: 89yo male therapeutic on heparin with initial dosing but at very low end of goal.  Goal of Therapy:  Heparin level 0.3-0.7 units/ml   Plan:  Will increase heparin infusion slightly to 1000 units/hr and check level in 8 hours.    Wynona Neat, PharmD, BCPS  12/24/2022,6:17 AM

## 2022-12-24 NOTE — H&P (Addendum)
Cardiology Admission History and Physical   Patient ID: Gregory [name] MRN: [id number]; DOB: [birth year]   Admission date: 12/23/2022  PCP:  Barbie Banner, MD   [facility] Providers Cardiologist:  Jodelle Red, MD   Chief Complaint:  chest pain  Patient Profile:   Gregory [name] is a 89 y.o. 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  History of Present Illness:   Gregory [name] has no know prior cardiac disease. He has a known infrarenal AAA 4cm monitored by PCP. He is on flecainide for PVCs. He has a history of ischemic right MCA stroke 2019, TIA ?2004 for which he takes plavix.  He has a history of right CEA in 2002 and now mild nonobstructive carotid artery stenosis followed biannually with [place].    On NYE, he had 1 hour of substernal chest pain that radiated to both biceps. He took tums and eventually the pain resolved. He saw his PCP yesterday who recommended ER evaluation.   He presented to [facility] ER found to have elevated troponin 356 --> 348. EKG showed sinus rhythm HR 60, first degree heart block , and QRS 118, isolated STE AVR. He was started on heparin gtt and cardiology was consulted for direct admission.   D dimer mildly elevated, CTA negative for PE, did show lung nodule and known stable infrarenal AAA.   On arrival, he has not had any additional chest pain. He describes sitting on NYE and texting a lot of people when the CP started. CP described in lower sternum, quality unclear, rated as a 6/10. He estimates CP lasted 2 hours, but then also stated he was able to go to bed that night. He woke up chest pain free. He called his PCP office and was directed to the ER. He has not had a recurrence of chest pain. No other complaints.  He is active with his family singing group. He has stairs in his house - he has had no prior chest pain with activity  leading up to this hospitalization.  He confirms no prior cardiac history, but does have a history of carotid artery disease and aortic atherosclerosis on CTA. He has a 10 pack year smoking history, quit over 20 years ago. 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 He is married with 2 children and 2 grandchildren. His family is a singing group ([employer]) that travels the country and abroad singing gospel music.   He states if he needs a heart cath he would like to go home and come back.   Past Medical History:  Diagnosis Date   Abdominal aneurysm [facility])    Depression    History of kidney stones    Hyperlipidemia    Hypertension    Stroke [facility]) 2004?   TIA    Past Surgical History:  Procedure Laterality Date   COLON SURGERY     polyps removed   COLONOSCOPY     EYE SURGERY     cataract right   IR ANGIO INTRA EXTRACRAN SEL COM CAROTID INNOMINATE BILAT MOD SED  02/28/2018   IR ANGIO VERTEBRAL SEL SUBCLAVIAN INNOMINATE UNI R MOD SED  02/28/2018   IR RADIOLOGIST EVAL & MGMT  02/12/2021   OTHER SURGICAL HISTORY     Carotid    RADIOLOGY WITH ANESTHESIA N/A 02/28/2018  Procedure: STENTING;  Surgeon: Julieanne Cotton, MD;  Location: [facility] OR;  Service: Radiology;  Laterality: N/A;     Medications Prior to Admission: Prior to Admission medications   Medication Sig Start Date End Date Taking? Authorizing Provider  ALPRAZolam Prudy Feeler) 0.5 MG tablet Pt takes 1 tab qhs 03/17/17  Yes [provider]  amLODipine (NORVASC) 5 MG tablet Take 5 mg by mouth daily.   Yes [provider]  aspirin 81 MG tablet Take 81 mg by mouth daily.   Yes [provider]  atorvastatin (LIPITOR) 40 MG tablet TAKE 1 TABLET BY MOUTH EVERY DAY TO LOWER CHOLESTEROL. 03/17/17  Yes [provider]  clopidogrel (PLAVIX) 75 MG tablet Take 75 mg by mouth daily.   Yes [provider]  flecainide (TAMBOCOR) 50 MG tablet Take 50  mg by mouth 2 (two) times daily.  03/16/17  Yes [provider]  mirtazapine (REMERON) 7.5 MG tablet TAKE 1 TABLET(7.5 MG) BY MOUTH AT BEDTIME 12/15/22  Yes Cottle, Steva Ready., MD  Multiple Vitamins-Minerals (CENTRUM ADULTS PO) Take 1 tablet by mouth daily.    Yes [provider]  Omega-3 Fatty Acids (FISH OIL) 1200 MG CAPS Take 1 capsule by mouth daily.    Yes [provider]  ramipril (ALTACE) 10 MG capsule Take 10 mg by mouth daily.   Yes [provider]  sertraline (ZOLOFT) 50 MG tablet TAKE 3 TABLETS(150 MG) BY MOUTH DAILY 12/15/22  Yes Cottle, Steva Ready., MD  tizanidine (ZANAFLEX) 2 MG capsule Take 1 capsule (2 mg total) by mouth at bedtime as needed for muscle spasms. 05/09/21  Yes Raspet, Noberto Retort, PA-C  Cholecalciferol (D3 VITAMIN PO) Take 2,000 Int'l Units by mouth daily.    [provider]  Glucosamine HCl (GLUCOSAMINE PO) Take 1,500 mg by mouth daily.    [provider]  propranolol (INDERAL) 10 MG tablet TK 1-2 TS PO BID AS NEEDED FOR TREMORS Patient not taking: Reported on 10/14/2021 07/31/19   Cottle, Steva Ready., MD     Allergies:    Allergies  Allergen Reactions   Bupropion Other (See Comments)    Unsure about alleries   Valproic Acid Other (See Comments)    Unsure about this    Social History:   Social History   Socioeconomic History   Marital status: Married    Spouse name: Not on file   Number of children: 2   Years of education: Not on file   Highest education level: Some college, no degree  Occupational History   Not on file  Tobacco Use   Smoking status: Former   Smokeless tobacco: Never  Building services engineer Use: Never used  Substance and Sexual Activity   Alcohol use: No   Drug use: No   Sexual activity: Not on file  Other Topics Concern   Not on file  Social History Narrative   Lives at home w/ his wife   Right-handed   Caffeine: occasional coffee and tea, decaf tea at home   Social  Determinants of Health   Financial Resource Strain: Not on file  Food Insecurity: Not on file  Transportation Needs: Not on file  Physical Activity: Not on file  Stress: Not on file  Social Connections: Not on file  Intimate Partner Violence: Not on file    Family History:   The patient's family history includes Cancer in his mother; Heart disease in his brother and mother. There is no history  of Tremor.    ROS:  Please see the history of present illness.  All other ROS reviewed and negative.     Physical Exam/Data:   Vitals:   12/24/22 0824 12/24/22 0825 12/24/22 1000 12/24/22 1135  BP:  (!) 121/94  (!) 132/105  Pulse: 62 63    Resp:  20 19   Temp:  98.5 F (36.9 C) 98.2 F (36.8 C)   TempSrc:  Oral Oral   SpO2: 95% 95% 96%   Weight:      Height:        Intake/Output Summary (Last 24 hours) at 12/24/2022 1138 Last data filed at 12/24/2022 0825 Gross per 24 hour  Intake --  Output 325 ml  Net -325 ml      12/23/2022    7:00 PM 12/23/2022    4:26 PM 04/21/2020    1:41 PM  Last 3 Weights  Weight (lbs) 179 lb 14.3 oz 180 lb 196 lb  Weight (kg) 81.6 kg 81.647 kg 88.905 kg     Body mass index is 24.4 kg/m.  General:  Well nourished, well developed, in no acute distress HEENT: normal Neck: no JVD Vascular: No carotid bruits; Distal pulses 2+ bilaterally   Cardiac:  normal S1, S2; RRR; no murmur  Lungs:  clear to auscultation bilaterally, no wheezing, rhonchi or rales  Abd: soft, nontender, no hepatomegaly  Ext: no edema Musculoskeletal:  No deformities, BUE and BLE strength normal and equal Skin: warm and dry  Neuro:  CNs 2-12 intact, no focal abnormalities noted Psych:  Normal affect    EKG:  The ECG that was done was personally reviewed and demonstrates sinus rhythm HR 60, first degree heart block, QRS 118, nonspecific STE in AVR  Relevant CV Studies:  Echo pending  Laboratory Data:  High Sensitivity Troponin:   Recent Labs  Lab 12/23/22 1636  12/23/22 1849  TROPONINIHS 356* 348*      Chemistry Recent Labs  Lab 12/23/22 1636  NA 139  K 4.2  CL 102  CO2 26  GLUCOSE 133*  BUN 16  CREATININE 1.31*  CALCIUM 10.1  GFRNONAA 53*  ANIONGAP 11    No results for input(s): "PROT", "ALBUMIN", "AST", "ALT", "ALKPHOS", "BILITOT" in the last 168 hours. Lipids No results for input(s): "CHOL", "TRIG", "HDL", "LABVLDL", "LDLCALC", "CHOLHDL" in the last 168 hours. Hematology Recent Labs  Lab 12/23/22 1636 12/23/22 2030  WBC 7.5 7.0  RBC 4.11* 3.96*  HGB 12.8* 12.5*  HCT 39.1 36.4*  MCV 95.1 91.9  MCH 31.1 31.6  MCHC 32.7 34.3  RDW 12.5 12.4  PLT 169 174   Thyroid No results for input(s): "TSH", "FREET4" in the last 168 hours. BNPNo results for input(s): "BNP", "PROBNP" in the last 168 hours.  DDimer  Recent Labs  Lab 12/23/22 1636  DDIMER 1.13*     Radiology/Studies:  CT Angio Chest/Abd/Pel for Dissection W and/or Wo Contrast  Result Date: 12/23/2022 CLINICAL DATA:  Chest pain. EXAM: CT ANGIOGRAPHY CHEST, ABDOMEN AND PELVIS TECHNIQUE: Non-contrast CT of the chest was initially obtained. Multidetector CT imaging through the chest, abdomen and pelvis was performed using the standard protocol during bolus administration of intravenous contrast. Multiplanar reconstructed images and MIPs were obtained and reviewed to evaluate the vascular anatomy. RADIATION DOSE REDUCTION: This exam was performed according to the departmental dose-optimization program which includes automated exposure control, adjustment of the mA and/or kV according to patient size and/or use of iterative reconstruction technique. CONTRAST:  OMNIPAQUE IOHEXOL  350 MG/ML SOLN COMPARISON:  April 12, 2018 FINDINGS: CTA CHEST FINDINGS Cardiovascular: There is marked severity calcification of the aortic arch, without evidence of aortic aneurysm or dissection. Satisfactory opacification of the pulmonary arteries to the segmental level. No evidence of pulmonary  embolism. Normal heart size with marked severity coronary artery calcification. No pericardial effusion. Mediastinum/Nodes: No enlarged mediastinal, hilar, or axillary lymph nodes. Thyroid gland, trachea, and esophagus demonstrate no significant findings. Lungs/Pleura: A 9 mm anterolateral right upper lobe noncalcified lung nodule is seen. There is no evidence of an acute infiltrate, pleural effusion or pneumothorax. Musculoskeletal: No chest wall abnormality. No acute or significant osseous findings. Review of the MIP images confirms the above findings. CTA ABDOMEN AND PELVIS FINDINGS VASCULAR Aorta: 4.0 cm x 3.6 cm aneurysmal dilatation of the infrarenal abdominal aorta without evidence of dissection, vasculitis or significant stenosis. Celiac: Patent without evidence of aneurysm, dissection, vasculitis or significant stenosis. SMA: Patent without evidence of aneurysm, dissection, vasculitis or significant stenosis. Renals: Both renal arteries are patent without evidence of aneurysm, dissection, vasculitis, fibromuscular dysplasia or significant stenosis. IMA: Patent without evidence of aneurysm, dissection, vasculitis or significant stenosis. Inflow: Patent without evidence of aneurysm, dissection, vasculitis or significant stenosis. Veins: No obvious venous abnormality within the limitations of this arterial phase study. Review of the MIP images confirms the above findings. NON-VASCULAR Hepatobiliary: No focal liver abnormality is seen. No gallstones, gallbladder wall thickening, or biliary dilatation. Pancreas: Unremarkable. No pancreatic ductal dilatation or surrounding inflammatory changes. Spleen: Normal in size without focal abnormality. Adrenals/Urinary Tract: Adrenal glands are unremarkable. Kidneys are normal, without renal calculi, focal lesion, or hydronephrosis. Bladder is unremarkable. Stomach/Bowel: There is a small hiatal hernia. Surgically anastomosed bowel is seen within the anterior aspect of  the mid to lower right abdomen. No evidence of bowel wall thickening, distention, or inflammatory changes. Noninflamed diverticula are seen throughout the sigmoid colon. Lymphatic: No abnormal abdominal or pelvic lymph nodes are identified. Reproductive: Prostate gland is mildly enlarged. Other: No abdominal wall hernia or abnormality. No abdominopelvic ascites. Musculoskeletal: No acute or significant osseous findings. Review of the MIP images confirms the above findings. IMPRESSION: 1. No evidence of pulmonary embolism or other acute intrathoracic process. 2. 9 mm anterolateral right upper lobe noncalcified lung nodule. Consider one of the following in 3 months for both low-risk and high-risk individuals: (a) repeat chest CT, (b) follow-up PET-CT, or (c) tissue sampling. This recommendation follows the consensus statement: Guidelines for Management of Incidental Pulmonary Nodules Detected on CT Images: From the Fleischner Society 2017; Radiology 2017; 284:228-243. 3. 4.0 cm x 3.6 cm infrarenal abdominal aortic aneurysm. 4. Sigmoid diverticulosis. 5. Small hiatal hernia. 6. Aortic atherosclerosis. Aortic Atherosclerosis (ICD10-I70.0). Electronically Signed   By: Virgina Norfolk M.D.   On: 12/23/2022 18:56   DG Chest Port 1 View  Result Date: 12/23/2022 CLINICAL DATA:  Chest pain EXAM: PORTABLE CHEST 1 VIEW COMPARISON:  05/09/2021 FINDINGS: The heart size and mediastinal contours are within normal limits. Spiculated appearing nodular opacity projecting over the right pulmonary apex. No acute appearing airspace opacity. Small, definitively benign calcified nodules of the left upper lobe. The visualized skeletal structures are unremarkable. IMPRESSION: 1. No acute appearing airspace opacity. 2. Spiculated appearing nodular opacity projecting over the right pulmonary apex. This is possibly a rib end calcification or other non parenchymal finding; recommend nonemergent CT to further evaluate. Electronically Signed    By: Delanna Ahmadi M.D.   On: 12/23/2022 17:03     Assessment and Plan:  NSTEMI HS troponin 356 --> 348 EKG with nonspecific ST changes D-dimer 1.13 --> CTA negative for PE, significant coronary calcifications noted Heparin gtt running Continue ASA, plavix, and increase lipitor from 40 mg to 80 mg CRF: carotid artery stenosis, aortic atherosclerosis, coronary calcifications on CT, HTN, HLD, prior smoking hx - no further chest pain - not currently on BB - HR on telemetry 50-60s - obtain echo - plan for heart cath on Monday - hold ACEI given AKI, recheck BMP - nitro PRN - will obtain echo   AKI sCr 1.31 - recheck BMP - hold ACEI until after cath   Hypertension PTA: 5 mg amlodipine, 10 mg ramipril   Hyperlipidemia with LDL goal < 70 Obtain lipid panel and LP (a) Increase lipitor from 40 mg to 80 mg   Aortic atherosclerosis  Seen on CTA Risk factor reduction   Carotid artery stenosis Hx of CEA 2002 Stroke x 2, most recent in 2019 Increase lipitor   PVCs Placed on flecainide per PCP I do not see a heart monitor in epic Will follow on telemetry - currently no PVCs Given NSTEMI, will discontinue flecainide given potential for obstructive CAD   AAA Infrarenal AAA 4.0 cm x 3.6 cm Will need to follow in 6 months   Lung nodule 9 mm RUL lung nodule Will need repeat imaging in 3 months   LHC: The patient understands that risks included but are not limited to stroke (1 in 1000), death (1 in 1000), kidney failure [usually temporary] (1 in 500), bleeding (1 in 200), allergic reaction [possibly serious] (1 in 200).      Risk Assessment/Risk Scores:    TIMI Risk Score for Unstable Angina or Non-ST Elevation MI:   The patient's TIMI risk score is 4, which indicates a 20% risk of all cause mortality, new or recurrent myocardial infarction or need for urgent revascularization in the next 14 days.       Severity of Illness: The appropriate patient status for  this patient is INPATIENT. Inpatient status is judged to be reasonable and necessary in order to provide the required intensity of service to ensure the patient's safety. The patient's presenting symptoms, physical exam findings, and initial radiographic and laboratory data in the context of their chronic comorbidities is felt to place them at high risk for further clinical deterioration. Furthermore, it is not anticipated that the patient will be medically stable for discharge from the hospital within 2 midnights of admission.   * I certify that at the point of admission it is my clinical judgment that the patient will require inpatient hospital care spanning beyond 2 midnights from the point of admission due to high intensity of service, high risk for further deterioration and high frequency of surveillance required.*   For questions or updates, please contact [facility] Please consult [email or web address] for contact info under     Signed, Marcelino Duster, PA  12/24/2022 11:38 AM

## 2022-12-24 NOTE — ED Notes (Signed)
07:56 Kim at [facility] will send transport. ED to [facility]) transfer, accepting Dr. Richardson Dopp)

## 2022-12-24 NOTE — ED Notes (Signed)
Patient placed onto cardiac monitor.  

## 2022-12-24 NOTE — Progress Notes (Signed)
  Echocardiogram 2D Echocardiogram has been performed.  Gregory [name] 12/24/2022, 4:42 PM

## 2022-12-24 NOTE — Progress Notes (Incomplete)
ANTICOAGULATION CONSULT NOTE - Initial Consult  Pharmacy Consult for heparin Indication: chest pain/ACS  Allergies  Allergen Reactions   Bupropion Other (See Comments)    Unsure about alleries   Valproic Acid Other (See Comments)    Unsure about this    Patient Measurements: Height: 6' (182.9 cm) Weight: 81.6 kg (179 lb 14.3 oz) IBW/kg (Calculated) : 77.6 Heparin Dosing Weight: 81.6kg  Vital Signs: Temp: 98.2 F (36.8 C) (01/06 1000) Temp Source: Oral (01/06 1000) BP: 132/105 (01/06 1135) Pulse Rate: 63 (01/06 0825)  Labs: Recent Labs    12/23/22 1636 12/23/22 1849 12/23/22 2030 12/24/22 0510  HGB 12.8*  --  12.5*  --   HCT 39.1  --  36.4*  --   PLT 169  --  174  --   HEPARINUNFRC  --   --   --  0.30  CREATININE 1.31*  --   --   --   TROPONINIHS 356* 348*  --   --     Estimated Creatinine Clearance: 44.4 mL/min (A) (by C-G formula based on SCr of 1.31 mg/dL (H)).   Medical History: Past Medical History:  Diagnosis Date   Abdominal aneurysm [facility])    Depression    History of kidney stones    Hyperlipidemia    Hypertension    Stroke [facility]) 2004?   TIA    Medications:  Medications Prior to Admission  Medication Sig Dispense Refill Last Dose   ALPRAZolam (XANAX) 0.5 MG tablet Take 0.5-1 mg by mouth See admin instructions. Take 0.5 mg in the morning and 1 mg at bedtime   Past Week   amLODipine (NORVASC) 5 MG tablet Take 5 mg by mouth daily.   Past Week   aspirin 81 MG tablet Take 81 mg by mouth daily.   Past Week   atorvastatin (LIPITOR) 40 MG tablet Take 40 mg by mouth daily.   Past Week   Calcium Carbonate Antacid (ALKA-SELTZER ANTACID PO) Take 2 tablets by mouth as needed (heartburn).   12/18/2022   Calcium Carbonate Antacid (TUMS PO) Take 2 tablets by mouth as needed (heartburn).   12/18/2022   clopidogrel (PLAVIX) 75 MG tablet Take 75 mg by mouth daily.   Past Week   flecainide (TAMBOCOR) 50 MG tablet Take 50 mg by mouth 2 (two) times daily.    Past  Week   ibuprofen (ADVIL) 200 MG tablet Take 400 mg by mouth as needed for moderate pain.   months   mirtazapine (REMERON) 7.5 MG tablet TAKE 1 TABLET(7.5 MG) BY MOUTH AT BEDTIME (Patient taking differently: Take 7.5 mg by mouth at bedtime.) 90 tablet 1 Past Week   Multiple Vitamins-Minerals (CENTRUM ADULTS PO) Take 1 tablet by mouth daily.    Past Week   Omega-3 Fatty Acids (FISH OIL PO) Take 1 capsule by mouth daily.   Past Week   ramipril (ALTACE) 10 MG capsule Take 10 mg by mouth daily.   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  Goal of Therapy:  {XHBZJ:[id number]} {Monitor platelets by anticoagulation protocol:3041561::"Monitor platelets by anticoagulation protocol: Yes"}   Plan:  {YBOF:7510258}  Arabella Merles 12/24/2022,12:16 PM

## 2022-12-25 DIAGNOSIS — N179 Acute kidney failure, unspecified: Secondary | ICD-10-CM

## 2022-12-25 DIAGNOSIS — I214 Non-ST elevation (NSTEMI) myocardial infarction: Secondary | ICD-10-CM | POA: Diagnosis not present

## 2022-12-25 DIAGNOSIS — Z8673 Personal history of transient ischemic attack (TIA), and cerebral infarction without residual deficits: Secondary | ICD-10-CM | POA: Diagnosis not present

## 2022-12-25 DIAGNOSIS — I10 Essential (primary) hypertension: Secondary | ICD-10-CM

## 2022-12-25 LAB — BASIC METABOLIC PANEL
Anion gap: 9 (ref 5–15)
BUN: 18 mg/dL (ref 8–23)
CO2: 23 mmol/L (ref 22–32)
Calcium: 8.9 mg/dL (ref 8.9–10.3)
Chloride: 103 mmol/L (ref 98–111)
Creatinine, Ser: 1.36 mg/dL — ABNORMAL HIGH (ref 0.61–1.24)
GFR, Estimated: 51 mL/min — ABNORMAL LOW (ref >60)
Glucose, Bld: 108 mg/dL — ABNORMAL HIGH (ref 70–99)
Potassium: 3.9 mmol/L (ref 3.5–5.1)
Sodium: 135 mmol/L (ref 135–145)

## 2022-12-25 LAB — HEPARIN LEVEL (UNFRACTIONATED)
Heparin Unfractionated: 0.25 IU/mL — ABNORMAL LOW (ref 0.30–0.70)
Heparin Unfractionated: 0.45 IU/mL (ref 0.30–0.70)
Heparin Unfractionated: 0.49 IU/mL (ref 0.30–0.70)

## 2022-12-25 LAB — LIPID PANEL
Cholesterol: 120 mg/dL (ref 0–200)
HDL: 37 mg/dL — ABNORMAL LOW (ref >40)
LDL Cholesterol: 63 mg/dL (ref 0–99)
Total CHOL/HDL Ratio: 3.2 RATIO
Triglycerides: 102 mg/dL (ref <150)
VLDL: 20 mg/dL (ref 0–40)

## 2022-12-25 LAB — CBC
HCT: 36.8 % — ABNORMAL LOW (ref 39.0–52.0)
Hemoglobin: 12.1 g/dL — ABNORMAL LOW (ref 13.0–17.0)
MCH: 30.7 pg (ref 26.0–34.0)
MCHC: 32.9 g/dL (ref 30.0–36.0)
MCV: 93.4 fL (ref 80.0–100.0)
Platelets: 171 10*3/uL (ref 150–400)
RBC: 3.94 MIL/uL — ABNORMAL LOW (ref 4.22–5.81)
RDW: 12.3 % (ref 11.5–15.5)
WBC: 8.1 10*3/uL (ref 4.0–10.5)
nRBC: 0 % (ref 0.0–0.2)

## 2022-12-25 MED ORDER — SODIUM CHLORIDE 0.9 % WEIGHT BASED INFUSION
3.0000 mL/kg/h | INTRAVENOUS | Status: DC
  Administered 2022-12-26: 3 mL/kg/h via INTRAVENOUS

## 2022-12-25 MED ORDER — ASPIRIN 81 MG PO CHEW
81.0000 mg | CHEWABLE_TABLET | ORAL | Status: AC
  Administered 2022-12-26: 81 mg via ORAL
  Filled 2022-12-25: qty 1

## 2022-12-25 MED ORDER — SODIUM CHLORIDE 0.9% FLUSH: 3.0000 mL | INTRAVENOUS | Status: DC | PRN

## 2022-12-25 MED ORDER — ASPIRIN 81 MG PO TBEC: 81.0000 mg | DELAYED_RELEASE_TABLET | Freq: Every day | ORAL | Status: DC

## 2022-12-25 MED ORDER — SODIUM CHLORIDE 0.9 % IV SOLN: 250.0000 mL | INTRAVENOUS | Status: DC | PRN

## 2022-12-25 MED ORDER — SODIUM CHLORIDE 0.9 % WEIGHT BASED INFUSION: 1.0000 mL/kg/h | INTRAVENOUS | Status: DC

## 2022-12-25 NOTE — Progress Notes (Addendum)
ANTICOAGULATION CONSULT NOTE-Follow Up  Pharmacy Consult for heparin  Indication: chest pain/ACS  Allergies  Allergen Reactions   Bupropion Other (See Comments)    Unsure about alleries   Valproic Acid Other (See Comments)    Unsure about this    Patient Measurements: Height: 6' (182.9 cm) Weight: 85.2 kg (187 lb 14.4 oz) IBW/kg (Calculated) : 77.6 Heparin Dosing Weight: 81.6kg   Vital Signs: Temp: 98.2 F (36.8 C) (01/07 1210) Temp Source: Oral (01/07 1210) BP: 138/76 (01/07 1210) Pulse Rate: 54 (01/07 1210)  Labs: Recent Labs    12/23/22 1636 12/23/22 1849 12/23/22 2030 12/24/22 0510 12/24/22 1313 12/24/22 1508 12/25/22 0123 12/25/22 1149  HGB 12.8*  --  12.5*  --  11.6*  --  12.1*  --   HCT 39.1  --  36.4*  --  35.6*  --  36.8*  --   PLT 169  --  174  --  164  --  171  --   HEPARINUNFRC  --   --   --  0.30  --   --  0.25* 0.45  CREATININE 1.31*  --   --   --  1.39*  --  1.36*  --   TROPONINIHS 356* 348*  --   --  155* 191*  --   --      Estimated Creatinine Clearance: 42.8 mL/min (A) (by C-G formula based on SCr of 1.36 mg/dL (H)).   Medical History: Past Medical History:  Diagnosis Date   Abdominal aneurysm [facility])    Depression    History of kidney stones    Hyperlipidemia    Hypertension    Stroke [facility]) 2004?   TIA    Assessment: Patient admitted with CC of chest pain, trop elevation. Not on anticoag PTA. Pharmacy consulted to dose heparin.   Heparin level returned at 0.45 on 1200 units/hr; no issues with infusion or overt s/sx of bleeding per RN; CBC stable   Goal of Therapy:  Heparin level 0.3-0.7 units/ml Monitor platelets by anticoagulation protocol: Yes   Plan:  Continue heparin infusion 1200 units/hr Check anti-Xa level in 8 hours and daily while on heparin Continue to monitor H&H and platelets  Sandford Craze, PharmD. [facility] Acute Care PGY-1  12/25/2022 12:35 PM    Addendum  HL came back therapeutic again.   Cont at  current rate.  Onnie Boer, PharmD, BCIDP, AAHIVP, CPP Infectious Disease Pharmacist 12/25/2022 8:33 PM

## 2022-12-25 NOTE — Progress Notes (Signed)
ANTICOAGULATION CONSULT NOTE   Pharmacy Consult for heparin  Indication: chest pain/ACS  Allergies  Allergen Reactions   Bupropion Other (See Comments)    Unsure about alleries   Valproic Acid Other (See Comments)    Unsure about this    Patient Measurements: Height: 6' (182.9 cm) Weight: 81.6 kg (179 lb 14.3 oz) IBW/kg (Calculated) : 77.6 Heparin Dosing Weight: 81.6kg   Vital Signs: Temp: 97.8 F (36.6 C) (01/07 0009) Temp Source: Oral (01/07 0009) BP: 124/68 (01/07 0009) Pulse Rate: 56 (01/07 0009)  Labs: Recent Labs    12/23/22 1636 12/23/22 1849 12/23/22 2030 12/24/22 0510 12/24/22 1313 12/24/22 1508 12/25/22 0123  HGB 12.8*  --  12.5*  --  11.6*  --  12.1*  HCT 39.1  --  36.4*  --  35.6*  --  36.8*  PLT 169  --  174  --  164  --  171  HEPARINUNFRC  --   --   --  0.30  --   --  0.25*  CREATININE 1.31*  --   --   --  1.39*  --  1.36*  TROPONINIHS 356* 348*  --   --  155* 191*  --      Estimated Creatinine Clearance: 42.8 mL/min (A) (by C-G formula based on SCr of 1.36 mg/dL (H)).   Medical History: Past Medical History:  Diagnosis Date   Abdominal aneurysm [facility])    Depression    History of kidney stones    Hyperlipidemia    Hypertension    Stroke [facility]) 2004?   TIA    Assessment: Patient admitted with CC of chest pain, trop elevation. Not on anticoag PTA. Pharmacy consulted to dose heparin.   Heparin level below goal 0.25 on 1000 units/hr; no issues with infusion or overt s/sx of bleeding; CBC stable  Goal of Therapy:  Heparin level 0.3-0.7 units/ml Monitor platelets by anticoagulation protocol: Yes   Plan:  Increase heparin infusion 1200 units/hr Check anti-Xa level in 8 hours and daily while on heparin Continue to monitor H&H and platelets  Georga Bora, PharmD Clinical Pharmacist 12/25/2022 3:17 AM Please check AMION for all [facility] numbers

## 2022-12-25 NOTE — Progress Notes (Signed)
Rounding Note    Patient Name: Gregory [name] Date of Encounter: 12/25/2022  [place] Cardiologist: Buford Dresser, MD   Subjective   No acute events overnight. No chest pain. Reviewed plans for cath tomorrow and he is amenable. Discussed echo results.  Inpatient Medications    Scheduled Meds:  amLODipine  5 mg Oral Daily   aspirin EC  81 mg Oral Daily   atorvastatin  80 mg Oral Daily   clopidogrel  75 mg Oral Daily   mirtazapine  7.5 mg Oral QHS   sertraline  150 mg Oral Daily   sodium chloride flush  3 mL Intravenous Q12H   Continuous Infusions:  heparin 1,200 Units/hr (12/25/22 0341)   PRN Meds: acetaminophen, ALPRAZolam, nitroGLYCERIN, ondansetron (ZOFRAN) IV   Vital Signs    Vitals:   12/25/22 0009 12/25/22 0253 12/25/22 0509 12/25/22 0831  BP: 124/68  (!) 128/57 117/70  Pulse: (!) 56  (!) 52 (!) 55  Resp: 20  18 16   Temp: 97.8 F (36.6 C)  98 F (36.7 C) 98 F (36.7 C)  TempSrc: Oral  Oral Oral  SpO2: 94% (!) 83% 99%   Weight:   85.2 kg   Height:        Intake/Output Summary (Last 24 hours) at 12/25/2022 1022 Last data filed at 12/25/2022 1000 Gross per 24 hour  Intake 603.78 ml  Output 1400 ml  Net -796.22 ml      12/25/2022    5:09 AM 12/23/2022    7:00 PM 12/23/2022    4:26 PM  Last 3 Weights  Weight (lbs) 187 lb 14.4 oz 179 lb 14.3 oz 180 lb  Weight (kg) 85.231 kg 81.6 kg 81.647 kg      Telemetry    NSR - Personally Reviewed  ECG    No new since 1/5 - Personally Reviewed  Physical Exam   GEN: Well nourished, well developed in no acute distress NECK: No JVD CARDIAC: regular rhythm, normal S1 and S2, no rubs or gallops. 1/6 systolic murmur. VASCULAR: Radial pulses 2+ bilaterally.  RESPIRATORY:  Clear to auscultation without rales, wheezing or rhonchi  ABDOMEN: Soft, non-tender, non-distended MUSCULOSKELETAL:  Moves all 4 limbs independently SKIN: Warm and dry, no edema NEUROLOGIC:  No focal neuro deficits  noted. PSYCHIATRIC:  Normal affect    Labs    High Sensitivity Troponin:   Recent Labs  Lab 12/23/22 1636 12/23/22 1849 12/24/22 1313 12/24/22 1508  TROPONINIHS 356* 348* 155* 191*     Chemistry Recent Labs  Lab 12/23/22 1636 12/24/22 1313 12/25/22 0123  NA 139 133* 135  K 4.2 4.2 3.9  CL 102 102 103  CO2 26 23 23   GLUCOSE 133* 188* 108*  BUN 16 14 18   CREATININE 1.31* 1.39* 1.36*  CALCIUM 10.1 8.8* 8.9  GFRNONAA 53* 49* 51*  ANIONGAP 11 8 9     Lipids  Recent Labs  Lab 12/25/22 0123  CHOL 120  TRIG 102  HDL 37*  LDLCALC 63  CHOLHDL 3.2    Hematology Recent Labs  Lab 12/23/22 2030 12/24/22 1313 12/25/22 0123  WBC 7.0 7.0 8.1  RBC 3.96* 3.77* 3.94*  HGB 12.5* 11.6* 12.1*  HCT 36.4* 35.6* 36.8*  MCV 91.9 94.4 93.4  MCH 31.6 30.8 30.7  MCHC 34.3 32.6 32.9  RDW 12.4 12.5 12.3  PLT 174 164 171   Thyroid No results for input(s): "TSH", "FREET4" in the last 168 hours.  BNP Recent Labs  Lab 12/24/22 1313  BNP 326.2*    DDimer  Recent Labs  Lab 12/23/22 1636  DDIMER 1.13*     Radiology    ECHOCARDIOGRAM COMPLETE  Result Date: 12/24/2022    ECHOCARDIOGRAM REPORT   Patient Name:   NAHOM DOUPE Kelm Date of Exam: 12/24/2022 Medical Rec #:  [id number]       Height:       72.0 in Accession #:    [id number]      Weight:       179.9 lb Date of Birth:  10-22-1934       BSA:          2.037 m Patient Age:    89 years        BP:           132/105 mmHg Patient Gender: M               HR:           64 bpm. Exam Location:  Inpatient Procedure: 2D Echo and Intracardiac Opacification Agent Indications:    chest pain  History:        Patient has no prior history of Echocardiogram examinations.                 Stroke; Risk Factors:Dyslipidemia and Hypertension.  Sonographer:    Harvie Junior Referring Phys: [id number] ANGELA NICOLE DUKE  Sonographer Comments: Technically difficult study due to poor echo windows. Image acquisition challenging due to respiratory motion. IMPRESSIONS   1. Left ventricular ejection fraction, by estimation, is 50 to 55%. The left ventricle has low normal function. The left ventricle demonstrates regional wall motion abnormalities (see scoring diagram/findings for description). There is mild concentric left ventricular hypertrophy. Left ventricular diastolic parameters are indeterminate. There is mild hypokinesis of the left ventricular, entire anterolateral wall and anterior wall.  2. Right ventricular systolic function is normal. The right ventricular size is normal. There is normal pulmonary artery systolic pressure.  3. The mitral valve is grossly normal. Trivial mitral valve regurgitation. No evidence of mitral stenosis.  4. The aortic valve is calcified. There is moderate calcification of the aortic valve. There is moderate thickening of the aortic valve. Aortic valve regurgitation is not visualized. Aortic valve sclerosis/calcification is present, without any evidence of aortic stenosis.  5. Aortic dilatation noted. There is mild dilatation of the aortic root, measuring 42 mm.  6. The inferior vena cava is normal in size with greater than 50% respiratory variability, suggesting right atrial pressure of 3 mmHg. Comparison(s): No prior Echocardiogram. Conclusion(s)/Recommendation(s): Low normal LVEF. With echo contrast, parasternal wall motion appears largely normal, but on apical images there may be mild hypokinesis of the anterior and anterolateral wall. FINDINGS  Left Ventricle: Left ventricular ejection fraction, by estimation, is 50 to 55%. The left ventricle has low normal function. The left ventricle demonstrates regional wall motion abnormalities. Mild hypokinesis of the left ventricular, entire anterolateral wall and anterior wall. Definity contrast agent was given IV to delineate the left ventricular endocardial borders. The left ventricular internal cavity size was normal in size. There is mild concentric left ventricular hypertrophy. Left  ventricular diastolic parameters are indeterminate. Right Ventricle: The right ventricular size is normal. No increase in right ventricular wall thickness. Right ventricular systolic function is normal. There is normal pulmonary artery systolic pressure. The tricuspid regurgitant velocity is 1.76 m/s, and  with an assumed right atrial pressure of 3 mmHg, the estimated right ventricular systolic pressure is 123XX123 mmHg. Left Atrium: Left  atrial size was normal in size. Right Atrium: Right atrial size was normal in size. Pericardium: There is no evidence of pericardial effusion. Mitral Valve: The mitral valve is grossly normal. Trivial mitral valve regurgitation. No evidence of mitral valve stenosis. Tricuspid Valve: The tricuspid valve is normal in structure. Tricuspid valve regurgitation is trivial. No evidence of tricuspid stenosis. Aortic Valve: The aortic valve is calcified. There is moderate calcification of the aortic valve. There is moderate thickening of the aortic valve. Aortic valve regurgitation is not visualized. Aortic valve sclerosis/calcification is present, without any  evidence of aortic stenosis. Aortic valve mean gradient measures 5.3 mmHg. Aortic valve peak gradient measures 9.6 mmHg. Aortic valve area, by VTI measures 2.10 cm. Pulmonic Valve: The pulmonic valve was not well visualized. Pulmonic valve regurgitation is not visualized. No evidence of pulmonic stenosis. Aorta: Aortic dilatation noted. There is mild dilatation of the aortic root, measuring 42 mm. Venous: The inferior vena cava is normal in size with greater than 50% respiratory variability, suggesting right atrial pressure of 3 mmHg. IAS/Shunts: The atrial septum is grossly normal.  LEFT VENTRICLE PLAX 2D LVIDd:         4.90 cm      Diastology LVIDs:         3.30 cm      LV e' medial:    6.96 cm/s LV PW:         1.20 cm      LV E/e' medial:  11.8 LV IVS:        1.20 cm      LV e' lateral:   7.62 cm/s LVOT diam:     1.90 cm      LV E/e'  lateral: 10.7 LV SV:         67 LV SV Index:   33 LVOT Area:     2.84 cm  LV Volumes (MOD) LV vol d, MOD A2C: 70.1 ml LV vol d, MOD A4C: 135.0 ml LV vol s, MOD A2C: 48.7 ml LV vol s, MOD A4C: 62.7 ml LV SV MOD A2C:     21.4 ml LV SV MOD A4C:     135.0 ml LV SV MOD BP:      51.1 ml RIGHT VENTRICLE RV Basal diam:  3.10 cm RV Mid diam:    2.90 cm RV S prime:     11.70 cm/s TAPSE (M-mode): 1.9 cm LEFT ATRIUM             Index        RIGHT ATRIUM          Index LA diam:        3.40 cm 1.67 cm/m   RA Area:     9.45 cm LA Vol (A2C):   38.9 ml 19.10 ml/m  RA Volume:   19.00 ml 9.33 ml/m LA Vol (A4C):   47.2 ml 23.17 ml/m LA Biplane Vol: 43.1 ml 21.16 ml/m  AORTIC VALVE                     PULMONIC VALVE AV Area (Vmax):    2.09 cm      PV Vmax:       1.50 m/s AV Area (Vmean):   1.98 cm      PV Peak grad:  9.0 mmHg AV Area (VTI):     2.10 cm AV Vmax:           155.00 cm/s AV Vmean:  104.667 cm/s AV VTI:            0.319 m AV Peak Grad:      9.6 mmHg AV Mean Grad:      5.3 mmHg LVOT Vmax:         114.00 cm/s LVOT Vmean:        73.200 cm/s LVOT VTI:          0.236 m LVOT/AV VTI ratio: 0.74  AORTA Ao Root diam: 4.20 cm Ao Asc diam:  3.70 cm MITRAL VALVE               TRICUSPID VALVE MV Area (PHT): 4.80 cm    TR Peak grad:   12.4 mmHg MV Decel Time: 158 msec    TR Vmax:        176.00 cm/s MR Peak grad: 8.6 mmHg MR Vmax:      147.00 cm/s  SHUNTS MV E velocity: 81.90 cm/s  Systemic VTI:  0.24 m MV A velocity: 84.00 cm/s  Systemic Diam: 1.90 cm MV E/A ratio:  0.98 Buford Dresser MD Electronically signed by Buford Dresser MD Signature Date/Time: 12/24/2022/8:53:51 PM    Final    [place] EKG SITE RITE  Result Date: 12/24/2022 If Site Rite image not attached, placement could not be confirmed due to current cardiac rhythm.  CT Angio Chest/Abd/Pel for Dissection W and/or Wo Contrast  Result Date: 12/23/2022 CLINICAL DATA:  Chest pain. EXAM: CT ANGIOGRAPHY CHEST, ABDOMEN AND PELVIS TECHNIQUE: Non-contrast  CT of the chest was initially obtained. Multidetector CT imaging through the chest, abdomen and pelvis was performed using the standard protocol during bolus administration of intravenous contrast. Multiplanar reconstructed images and MIPs were obtained and reviewed to evaluate the vascular anatomy. RADIATION DOSE REDUCTION: This exam was performed according to the departmental dose-optimization program which includes automated exposure control, adjustment of the mA and/or kV according to patient size and/or use of iterative reconstruction technique. CONTRAST:  114mL OMNIPAQUE IOHEXOL 350 MG/ML SOLN COMPARISON:  April 12, 2018 FINDINGS: CTA CHEST FINDINGS Cardiovascular: There is marked severity calcification of the aortic arch, without evidence of aortic aneurysm or dissection. Satisfactory opacification of the pulmonary arteries to the segmental level. No evidence of pulmonary embolism. Normal heart size with marked severity coronary artery calcification. No pericardial effusion. Mediastinum/Nodes: No enlarged mediastinal, hilar, or axillary lymph nodes. Thyroid gland, trachea, and esophagus demonstrate no significant findings. Lungs/Pleura: A 9 mm anterolateral right upper lobe noncalcified lung nodule is seen. There is no evidence of an acute infiltrate, pleural effusion or pneumothorax. Musculoskeletal: No chest wall abnormality. No acute or significant osseous findings. Review of the MIP images confirms the above findings. CTA ABDOMEN AND PELVIS FINDINGS VASCULAR Aorta: 4.0 cm x 3.6 cm aneurysmal dilatation of the infrarenal abdominal aorta without evidence of dissection, vasculitis or significant stenosis. Celiac: Patent without evidence of aneurysm, dissection, vasculitis or significant stenosis. SMA: Patent without evidence of aneurysm, dissection, vasculitis or significant stenosis. Renals: Both renal arteries are patent without evidence of aneurysm, dissection, vasculitis, fibromuscular dysplasia or  significant stenosis. IMA: Patent without evidence of aneurysm, dissection, vasculitis or significant stenosis. Inflow: Patent without evidence of aneurysm, dissection, vasculitis or significant stenosis. Veins: No obvious venous abnormality within the limitations of this arterial phase study. Review of the MIP images confirms the above findings. NON-VASCULAR Hepatobiliary: No focal liver abnormality is seen. No gallstones, gallbladder wall thickening, or biliary dilatation. Pancreas: Unremarkable. No pancreatic ductal dilatation or surrounding inflammatory changes. Spleen: Normal in size without focal  abnormality. Adrenals/Urinary Tract: Adrenal glands are unremarkable. Kidneys are normal, without renal calculi, focal lesion, or hydronephrosis. Bladder is unremarkable. Stomach/Bowel: There is a small hiatal hernia. Surgically anastomosed bowel is seen within the anterior aspect of the mid to lower right abdomen. No evidence of bowel wall thickening, distention, or inflammatory changes. Noninflamed diverticula are seen throughout the sigmoid colon. Lymphatic: No abnormal abdominal or pelvic lymph nodes are identified. Reproductive: Prostate gland is mildly enlarged. Other: No abdominal wall hernia or abnormality. No abdominopelvic ascites. Musculoskeletal: No acute or significant osseous findings. Review of the MIP images confirms the above findings. IMPRESSION: 1. No evidence of pulmonary embolism or other acute intrathoracic process. 2. 9 mm anterolateral right upper lobe noncalcified lung nodule. Consider one of the following in 3 months for both low-risk and high-risk individuals: (a) repeat chest CT, (b) follow-up PET-CT, or (c) tissue sampling. This recommendation follows the consensus statement: Guidelines for Management of Incidental Pulmonary Nodules Detected on CT Images: From the Fleischner Society 2017; Radiology 2017; 284:228-243. 3. 4.0 cm x 3.6 cm infrarenal abdominal aortic aneurysm. 4. Sigmoid  diverticulosis. 5. Small hiatal hernia. 6. Aortic atherosclerosis. Aortic Atherosclerosis (ICD10-I70.0). Electronically Signed   By: Virgina Norfolk M.D.   On: 12/23/2022 18:56   DG Chest Port 1 View  Result Date: 12/23/2022 CLINICAL DATA:  Chest pain EXAM: PORTABLE CHEST 1 VIEW COMPARISON:  05/09/2021 FINDINGS: The heart size and mediastinal contours are within normal limits. Spiculated appearing nodular opacity projecting over the right pulmonary apex. No acute appearing airspace opacity. Small, definitively benign calcified nodules of the left upper lobe. The visualized skeletal structures are unremarkable. IMPRESSION: 1. No acute appearing airspace opacity. 2. Spiculated appearing nodular opacity projecting over the right pulmonary apex. This is possibly a rib end calcification or other non parenchymal finding; recommend nonemergent CT to further evaluate. Electronically Signed   By: Delanna Ahmadi M.D.   On: 12/23/2022 17:03    Cardiac Studies   Echo 12/24/22  1. Left ventricular ejection fraction, by estimation, is 50 to 55%. The left ventricle has low normal function. The left ventricle demonstrates regional wall motion abnormalities (see scoring diagram/findings for description). There is mild concentric left ventricular hypertrophy. Left ventricular diastolic parameters are indeterminate. There is mild hypokinesis of the left ventricular, entire anterolateral wall and anterior wall.   2. Right ventricular systolic function is normal. The right ventricular size is normal. There is normal pulmonary artery systolic pressure.   3. The mitral valve is grossly normal. Trivial mitral valve regurgitation. No evidence of mitral stenosis.   4. The aortic valve is calcified. There is moderate calcification of the aortic valve. There is moderate thickening of the aortic valve. Aortic valve regurgitation is not visualized. Aortic valve sclerosis/calcification is present, without any evidence of aortic  stenosis.   5. Aortic dilatation noted. There is mild dilatation of the aortic root, measuring 42 mm.   6. The inferior vena cava is normal in size with greater than 50% respiratory variability, suggesting right atrial pressure of 3 mmHg.   Comparison(s): No prior Echocardiogram.   Conclusion(s)/Recommendation(s): Low normal LVEF. With echo contrast,  parasternal wall motion appears largely normal, but on apical images there  may be mild hypokinesis of the anterior and anterolateral wall.   Patient Profile     89 y.o. male with PMH prior CVA, carotid stenosis s/p R CEA, infrarenal AAA, hypertension presenting with chest pain and found to have NSTEMI  Assessment & Plan    NSTEMI History of  vascular disease, including carotid stenosis s/p CEA and prior ischemic stroke Hyperlipidemia -elevated troponin, initial 356 and downtrending -coronary calcifications on CT, known vascular disease, prior tobacco use, aortic atherosclerosis all comorbidities -continue heparin, aspirin, clopidogrel. Did discuss that there is a chance he may need CABG, but given other vascular disease will continue DAPT for now -increased atorvastatin this admission -pending lp(a) -NO flecainide given MI. Unclear history of PVCs, would not restart -no beta blocker as he is baseline bradycardic -echo as above. Difficult images, but I personally read, apical contrast images may have mild hypokinesis of the anterior and anterolateral wall -lipids show tchol 120, TG 102, HDL 37, LDL 63 -planned for cath 1/8, see consent 1/6  Hypertension Acute kidney injury -prior to admission he was on amlodipine 5 mg, ramipril 10 mg daily -had mild AKI on admission, Cr 1.31 from baseline ~1.1. Peaked at 1.39, now downtrending -holding ramipril -blood pressures well controlled currently on amlodipine  Incidental lung nodule -will need repeat imaging in 3 mos  Infrarenal AAA Mild dilation of ascending aorta -monitor as  outpatient  For questions or updates, please contact [place] Please consult [email or web address] for contact info under        Signed, Buford Dresser, MD  12/25/2022, 10:22 AM

## 2022-12-26 ENCOUNTER — Encounter (HOSPITAL_COMMUNITY): Payer: Self-pay | Admitting: Cardiovascular Disease

## 2022-12-26 ENCOUNTER — Encounter (HOSPITAL_COMMUNITY)
Admission: EM | Disposition: A | Discharge status: Home / Self Care | Payer: Self-pay | Source: Home / Self Care | Attending: Cardiology

## 2022-12-26 DIAGNOSIS — I214 Non-ST elevation (NSTEMI) myocardial infarction: Secondary | ICD-10-CM | POA: Diagnosis not present

## 2022-12-26 DIAGNOSIS — I2510 Atherosclerotic heart disease of native coronary artery without angina pectoris: Secondary | ICD-10-CM

## 2022-12-26 HISTORY — PX: LEFT HEART CATH AND CORONARY ANGIOGRAPHY: CATH118249

## 2022-12-26 LAB — CBC
HCT: 37 % — ABNORMAL LOW (ref 39.0–52.0)
Hemoglobin: 12.5 g/dL — ABNORMAL LOW (ref 13.0–17.0)
MCH: 31.3 pg (ref 26.0–34.0)
MCHC: 33.8 g/dL (ref 30.0–36.0)
MCV: 92.5 fL (ref 80.0–100.0)
Platelets: 165 10*3/uL (ref 150–400)
RBC: 4 MIL/uL — ABNORMAL LOW (ref 4.22–5.81)
RDW: 12.3 % (ref 11.5–15.5)
WBC: 7.2 10*3/uL (ref 4.0–10.5)
nRBC: 0 % (ref 0.0–0.2)

## 2022-12-26 LAB — BASIC METABOLIC PANEL
Anion gap: 10 (ref 5–15)
BUN: 22 mg/dL (ref 8–23)
CO2: 22 mmol/L (ref 22–32)
Calcium: 9.1 mg/dL (ref 8.9–10.3)
Chloride: 104 mmol/L (ref 98–111)
Creatinine, Ser: 1.39 mg/dL — ABNORMAL HIGH (ref 0.61–1.24)
GFR, Estimated: 49 mL/min — ABNORMAL LOW (ref >60)
Glucose, Bld: 106 mg/dL — ABNORMAL HIGH (ref 70–99)
Potassium: 3.9 mmol/L (ref 3.5–5.1)
Sodium: 136 mmol/L (ref 135–145)

## 2022-12-26 LAB — HEPARIN LEVEL (UNFRACTIONATED): Heparin Unfractionated: 0.47 IU/mL (ref 0.30–0.70)

## 2022-12-26 LAB — HEMOGLOBIN A1C
Hgb A1c MFr Bld: 6.5 % — ABNORMAL HIGH (ref 4.8–5.6)
Mean Plasma Glucose: 140 mg/dL

## 2022-12-26 SURGERY — LEFT HEART CATH AND CORONARY ANGIOGRAPHY
Anesthesia: LOCAL

## 2022-12-26 MED ORDER — NITROGLYCERIN 1 MG/10 ML FOR IR/CATH LAB
INTRA_ARTERIAL | Status: AC
  Filled 2022-12-26: qty 10

## 2022-12-26 MED ORDER — ATORVASTATIN CALCIUM 80 MG PO TABS
80.0000 mg | ORAL_TABLET | Freq: Every day | ORAL | Status: DC
  Administered 2022-12-26: 80 mg via ORAL

## 2022-12-26 MED ORDER — FENTANYL CITRATE (PF) 100 MCG/2ML IJ SOLN
INTRAMUSCULAR | Status: AC
  Filled 2022-12-26: qty 2

## 2022-12-26 MED ORDER — HEPARIN (PORCINE) IN NACL 1000-0.9 UT/500ML-% IV SOLN
INTRAVENOUS | Status: AC
  Filled 2022-12-26: qty 1000

## 2022-12-26 MED ORDER — LABETALOL HCL 5 MG/ML IV SOLN: 10.0000 mg | INTRAVENOUS | Status: AC | PRN

## 2022-12-26 MED ORDER — LIDOCAINE HCL (PF) 1 % IJ SOLN
INTRAMUSCULAR | Status: DC | PRN
  Administered 2022-12-26: 2 mL via INTRADERMAL

## 2022-12-26 MED ORDER — MIDAZOLAM HCL 2 MG/2ML IJ SOLN
INTRAMUSCULAR | Status: DC | PRN
  Administered 2022-12-26: 1 mg via INTRAVENOUS

## 2022-12-26 MED ORDER — VERAPAMIL HCL 2.5 MG/ML IV SOLN
INTRA_ARTERIAL | Status: DC | PRN
  Administered 2022-12-26: 10 mL via INTRA_ARTERIAL

## 2022-12-26 MED ORDER — HYDRALAZINE HCL 20 MG/ML IJ SOLN: 10.0000 mg | INTRAMUSCULAR | Status: AC | PRN

## 2022-12-26 MED ORDER — HEPARIN (PORCINE) IN NACL 1000-0.9 UT/500ML-% IV SOLN
INTRAVENOUS | Status: DC | PRN
  Administered 2022-12-26 (×2): 500 mL

## 2022-12-26 MED ORDER — ACETAMINOPHEN 325 MG PO TABS: 650.0000 mg | ORAL_TABLET | ORAL | Status: DC | PRN

## 2022-12-26 MED ORDER — VERAPAMIL HCL 2.5 MG/ML IV SOLN
INTRAVENOUS | Status: AC
  Filled 2022-12-26: qty 2

## 2022-12-26 MED ORDER — SODIUM CHLORIDE 0.9% FLUSH
3.0000 mL | Freq: Two times a day (BID) | INTRAVENOUS | Status: DC
  Administered 2022-12-26: 3 mL via INTRAVENOUS

## 2022-12-26 MED ORDER — MORPHINE SULFATE (PF) 2 MG/ML IV SOLN: 2.0000 mg | INTRAVENOUS | Status: DC | PRN

## 2022-12-26 MED ORDER — MIDAZOLAM HCL 2 MG/2ML IJ SOLN
INTRAMUSCULAR | Status: AC
  Filled 2022-12-26: qty 2

## 2022-12-26 MED ORDER — ONDANSETRON HCL 4 MG/2ML IJ SOLN: 4.0000 mg | Freq: Four times a day (QID) | INTRAMUSCULAR | Status: DC | PRN

## 2022-12-26 MED ORDER — SODIUM CHLORIDE 0.9% FLUSH: 3.0000 mL | INTRAVENOUS | Status: DC | PRN

## 2022-12-26 MED ORDER — ATORVASTATIN CALCIUM 80 MG PO TABS: 80.0000 mg | ORAL_TABLET | Freq: Every day | ORAL | 3 refills | Status: AC

## 2022-12-26 MED ORDER — ASPIRIN 81 MG PO CHEW: 81.0000 mg | CHEWABLE_TABLET | Freq: Every day | ORAL | Status: DC

## 2022-12-26 MED ORDER — SODIUM CHLORIDE 0.9 % IV SOLN: 250.0000 mL | INTRAVENOUS | Status: DC | PRN

## 2022-12-26 MED ORDER — IOHEXOL 350 MG/ML SOLN
INTRAVENOUS | Status: DC | PRN
  Administered 2022-12-26: 80 mL

## 2022-12-26 MED ORDER — LIDOCAINE HCL (PF) 1 % IJ SOLN
INTRAMUSCULAR | Status: AC
  Filled 2022-12-26: qty 30

## 2022-12-26 MED ORDER — FENTANYL CITRATE (PF) 100 MCG/2ML IJ SOLN
INTRAMUSCULAR | Status: DC | PRN
  Administered 2022-12-26: 25 ug via INTRAVENOUS

## 2022-12-26 MED ORDER — HEPARIN SODIUM (PORCINE) 1000 UNIT/ML IJ SOLN
INTRAMUSCULAR | Status: AC
  Filled 2022-12-26: qty 10

## 2022-12-26 MED ORDER — CLOPIDOGREL BISULFATE 75 MG PO TABS
75.0000 mg | ORAL_TABLET | Freq: Every day | ORAL | Status: DC
  Administered 2022-12-26: 75 mg via ORAL

## 2022-12-26 MED ORDER — HEPARIN SODIUM (PORCINE) 1000 UNIT/ML IJ SOLN
INTRAMUSCULAR | Status: DC | PRN
  Administered 2022-12-26: 4000 [IU] via INTRAVENOUS

## 2022-12-26 MED ORDER — SODIUM CHLORIDE 0.9 % IV SOLN: INTRAVENOUS | Status: DC

## 2022-12-26 SURGICAL SUPPLY — 13 items
CATH INFINITI 5 FR JL3.5 (CATHETERS) IMPLANT
CATH INFINITI 5 FR JR3.5 (CATHETERS) IMPLANT
CATH OPTITORQUE TIG 4.0 5F (CATHETERS) IMPLANT
DEVICE RAD COMP TR BAND LRG (VASCULAR PRODUCTS) IMPLANT
GLIDESHEATH SLEND A-KIT 6F 22G (SHEATH) IMPLANT
GUIDEWIRE ANGLED .035X150CM (WIRE) IMPLANT
GUIDEWIRE INQWIRE 1.5J.035X260 (WIRE) IMPLANT
INQWIRE 1.5J .035X260CM (WIRE) ×1
KIT HEART LEFT (KITS) ×1 IMPLANT
PACK CARDIAC CATHETERIZATION (CUSTOM PROCEDURE TRAY) ×1 IMPLANT
TRANSDUCER W/STOPCOCK (MISCELLANEOUS) ×1 IMPLANT
TUBING CIL FLEX 10 FLL-RA (TUBING) ×1 IMPLANT
WIRE HI TORQ VERSACORE-J 145CM (WIRE) IMPLANT

## 2022-12-26 NOTE — Discharge Instructions (Signed)
No lifting over 5 lbs for 1 week. No sexual activity for 1 week. Keep procedure site clean & dry. If you notice increased pain, swelling, bleeding or pus, call/return!  You may shower, but no soaking baths/hot tubs/pools for 1 week °

## 2022-12-26 NOTE — Progress Notes (Addendum)
Discussed with the patient's son regarding lung nodule seen on recent CTA. I have printed out a copy of his report to take to PCP so they can follow up on the lung nodule. I have spoken with the patient regarding lung nodule as well. At the request of the patient, appt moved to 01/24/2023.

## 2022-12-26 NOTE — Care Management (Signed)
  Transition of Care [facility]) Screening Note   Patient Details  Name: Gregory [name] Date of Birth: 1934-04-24   Transition of Care [facility]) CM/SW Contact:    Bethena Roys, RN Phone Number: 12/26/2022, 1:00 PM    Transition of Care Department [facility]) has reviewed the patient and no TOC needs have been identified at this time. We will continue to monitor patient advancement through interdisciplinary progression rounds. If new patient transition needs arise, please place a TOC consult.

## 2022-12-26 NOTE — Progress Notes (Signed)
CARDIAC REHAB PHASE I    Post MI education including site care, restrictions, risk factors, MI booklet, exercise guidelines, heart healthy diet and CRP2 reviewed. All questions and concerns addressed. Will refer to [facility] for CRP2. Will continue to follow.   [id number]  Vanessa Barbara, RN BSN 12/26/2022 2:34 PM

## 2022-12-26 NOTE — Progress Notes (Signed)
ANTICOAGULATION CONSULT NOTE -Follow Up  Pharmacy Consult for heparin  Indication: chest pain/ACS  Allergies  Allergen Reactions   Bupropion Other (See Comments)    Unsure about alleries   Valproic Acid Other (See Comments)    Unsure about this    Patient Measurements: Height: 6' (182.9 cm) Weight: 83.9 kg (185 lb) IBW/kg (Calculated) : 77.6 Heparin Dosing Weight: 81.6kg   Vital Signs: Temp: 98 F (36.7 C) (01/08 0528) Temp Source: Oral (01/08 0528) BP: 102/81 (01/08 0528) Pulse Rate: 55 (01/08 0528)  Labs: Recent Labs    12/23/22 1849 12/23/22 2030 12/24/22 1313 12/24/22 1508 12/25/22 0123 12/25/22 1149 12/25/22 1956 12/26/22 0156  HGB  --    < > 11.6*  --  12.1*  --   --  12.5*  HCT  --    < > 35.6*  --  36.8*  --   --  37.0*  PLT  --    < > 164  --  171  --   --  165  HEPARINUNFRC  --    < >  --   --  0.25* 0.45 0.49 0.47  CREATININE  --   --  1.39*  --  1.36*  --   --  1.39*  TROPONINIHS 348*  --  155* 191*  --   --   --   --    < > = values in this interval not displayed.     Estimated Creatinine Clearance: 41.9 mL/min (A) (by C-G formula based on SCr of 1.39 mg/dL (H)).   Medical History: Past Medical History:  Diagnosis Date   Abdominal aneurysm [facility])    Depression    History of kidney stones    Hyperlipidemia    Hypertension    Stroke [facility]) 2004?   TIA    Assessment: Patient admitted with CC of chest pain, trop elevation. Not on anticoag PTA. Pharmacy consulted to dose heparin.   Heparin level is therapeutic at 0.47, on 1200 units/hr. Hgb 12.5, plt 165. No s/sx of bleeding or infusion issues.   Goal of Therapy:  Heparin level 0.3-0.7 units/ml Monitor platelets by anticoagulation protocol: Yes   Plan:  Continue heparin infusion 1200 units/hr Check anti-Xa level daily while on heparin Continue to monitor H&H and platelets  Antonietta Jewel, PharmD, [place] Pharmacist  Phone: [phone] 12/26/2022 9:34 AM  Please check AMION for  all [place] phone numbers After 10:00 PM, call [place] [phone]

## 2022-12-26 NOTE — Discharge Summary (Addendum)
Discharge Summary    Patient ID: Gregory [name] MRN: [id number]; DOB: [birth year]  Admit date: 12/23/2022 Discharge date: 12/26/2022  PCP:  Barbie Banner, MD   [facility] Providers Cardiologist:  Jodelle Red, MD        Discharge Diagnoses    Principal Problem:   NSTEMI (non-ST elevated myocardial infarction) [facility]) Active Problems:   Unstable angina (HCC)   AKI (acute kidney injury) (HCC)   Primary hypertension   History of CVA (cerebrovascular accident)    Diagnostic Studies/Procedures    Echo 12/24/2022  1. Left ventricular ejection fraction, by estimation, is 50 to 55%. The  left ventricle has low normal function. The left ventricle demonstrates  regional wall motion abnormalities (see scoring diagram/findings for  description). There is mild concentric  left ventricular hypertrophy. Left ventricular diastolic parameters are  indeterminate. There is mild hypokinesis of the left ventricular, entire  anterolateral wall and anterior wall.   2. Right ventricular systolic function is normal. The right ventricular  size is normal. There is normal pulmonary artery systolic pressure.   3. The mitral valve is grossly normal. Trivial mitral valve  regurgitation. No evidence of mitral stenosis.   4. The aortic valve is calcified. There is moderate calcification of the  aortic valve. There is moderate thickening of the aortic valve. Aortic  valve regurgitation is not visualized. Aortic valve  sclerosis/calcification is present, without any evidence  of aortic stenosis.   5. Aortic dilatation noted. There is mild dilatation of the aortic root,  measuring 42 mm.   6. The inferior vena cava is normal in size with greater than 50%  respiratory variability, suggesting right atrial pressure of 3 mmHg.   Comparison(s): No prior Echocardiogram.   Conclusion(s)/Recommendation(s): Low normal LVEF. With echo contrast,  parasternal wall motion appears largely normal,  but on apical images there  may be mild hypokinesis of the anterior and anterolateral wall.  _____________   History of Present Illness     Gregory [name] is a 89 y.o. 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   Mr. Rieves has no know prior cardiac disease. He has a known infrarenal AAA 4cm monitored by PCP. He is on flecainide for PVCs. He has a history of ischemic right MCA stroke 2019, TIA ?2004 for which he takes plavix.  He has a history of right CEA in 2002 and now mild nonobstructive carotid artery stenosis followed biannually with [place].     On NYE, he had 1 hour of substernal chest pain that radiated to both biceps. He took tums and eventually the pain resolved. He saw his PCP yesterday who recommended ER evaluation.    He presented to [facility] ER found to have elevated troponin 356 --> 348. EKG showed sinus rhythm HR 60, first degree heart block , and QRS 118, isolated STE AVR. He was started on heparin gtt and cardiology was consulted for direct admission.    D dimer mildly elevated, CTA negative for PE, did show lung nodule and known stable infrarenal AAA.    On arrival, he has not had any additional chest pain. He describes sitting on NYE and texting a lot of people when the CP started. CP described in lower sternum, quality unclear, rated as a 6/10. He estimates CP lasted 2 hours, but then also stated he was able to go to bed that night. He  woke up chest pain free. He called his PCP office and was directed to the ER. He has not had a recurrence of chest pain. No other complaints.   He is active with his family singing group. He has stairs in his house - he has had no prior chest pain with activity leading up to this hospitalization.   He confirms no prior cardiac history, but does have a history of carotid artery disease and aortic atherosclerosis on CTA. He has a 10 pack year  smoking history, quit over 20 years ago. 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 He is married with 2 children and 2 grandchildren. His family is a singing group ([employer]) that travels the country and abroad singing gospel music.    He states if he needs a heart cath he would like to go home and come back.  Hospital Course     Consultants: N/A   Patient was admitted to cardiology service.  D-dimer was elevated to 1.13.  CTA of the chest was negative for PE, there was significant coronary calcification noted.  Patient was placed on IV heparin anticipation of cardiac catheterization.  Lipitor was increased to 80 mg daily.  Given NSTEMI, home flecainide was discontinued given possibility of obstructive CAD.  He was previously on flecainide for PVCs per PCP.  Echocardiogram obtained on 12/24/2021 showed EF 50 to 55%, mild hypokinesis of the anterior and anterolateral wall, normal RV, trivial MR, mildly dilated aortic root measuring 42 mm.  Patient ultimately underwent cardiac catheterization on 12/26/2022 which revealed 50% distal left circumflex lesion, 50% proximal to mid LAD lesion.  Medical therapy was recommended.  LVEDP was 19.  He was already on dual antiplatelet therapy for prior carotid stenting.  Postprocedure, patient did not have any acute complaint.  Right radial cath site appears to be stable, no bleeding or drainage.  He is deemed stable for discharge from the cardiac perspective.  Note, significant medication changes during this hospitalization include increase of Lipitor to 80 mg daily, discontinuation of flecainide given coronary artery disease, and discontinuation of home ramipril.  Ramipril was initially held due to mild AKI (when compare to lab from 4 years ago, although repeat lab suggest it is more likely to be CKD rather than AKI) prior to cardiac catheterization, I did not restart ramipril post cardiac catheterization as  patient's blood pressure has been in the 100-110s range without ramipril.  We can reassess on the follow-up to decide whether to resume ramipril at a later time.  Patient has been instructed to avoid lifting anything greater than 5 pounds for 1 week.  Follow-up has been arranged in 2 weeks.      Did the patient have an acute coronary syndrome (MI, NSTEMI, STEMI, etc) this admission?:  No.   The elevated Troponin was due to the acute medical illness (demand ischemia).         _____________  Discharge Vitals Blood pressure 117/85, pulse (!) 52, temperature 97.9 F (36.6 C), temperature source Oral, resp. rate 17, height 6' (1.829 m), weight 83.9 kg, SpO2 96 %.  Filed Weights   12/23/22 1900 12/25/22 0509 12/26/22 0528  Weight: 81.6 kg 85.2 kg 83.9 kg    Labs & Radiologic Studies    CBC Recent Labs    12/24/22 1313 12/25/22 0123 12/26/22 0156  WBC 7.0 8.1 7.2  NEUTROABS 4.8  --   --   HGB 11.6* 12.1*  12.5*  HCT 35.6* 36.8* 37.0*  MCV 94.4 93.4 92.5  PLT 164 171 165   Basic Metabolic Panel Recent Labs    82/95/62 0123 12/26/22 0156  NA 135 136  K 3.9 3.9  CL 103 104  CO2 23 22  GLUCOSE 108* 106*  BUN 18 22  CREATININE 1.36* 1.39*  CALCIUM 8.9 9.1   Liver Function Tests No results for input(s): "AST", "ALT", "ALKPHOS", "BILITOT", "PROT", "ALBUMIN" in the last 72 hours. No results for input(s): "LIPASE", "AMYLASE" in the last 72 hours. High Sensitivity Troponin:   Recent Labs  Lab 12/23/22 1636 12/23/22 1849 12/24/22 1313 12/24/22 1508  TROPONINIHS 356* 348* 155* 191*    BNP Invalid input(s): "POCBNP" D-Dimer Recent Labs    12/23/22 1636  DDIMER 1.13*   Hemoglobin A1C Recent Labs    12/24/22 1313  HGBA1C 6.5*   Fasting Lipid Panel Recent Labs    12/25/22 0123  CHOL 120  HDL 37*  LDLCALC 63  TRIG 130  CHOLHDL 3.2   Thyroid Function Tests No results for input(s): "TSH", "T4TOTAL", "T3FREE", "THYROIDAB" in the last 72 hours.  Invalid  input(s): "FREET3" _____________  CARDIAC CATHETERIZATION  Result Date: 12/26/2022 Images from the original result were not included.   Dist Cx lesion is 50% stenosed.   Prox LAD to Mid LAD lesion is 50% stenosed. DEJOUR VOS is a 89 y.o. male  [id number] LOCATION:  FACILITY: [facility] PHYSICIAN: Nanetta Batty, M.D. 1934/10/23 DATE OF PROCEDURE:  12/26/2022 DATE OF DISCHARGE: CARDIAC CATHETERIZATION History obtained from chart review.89 y.o. male with past medical history of CVA, carotid artery disease status post carotid endarterectomy, abdominal aortic aneurysm, hypertension admitted with non-ST elevation myocardial infarction.  Echocardiogram shows ejection fraction 50 to 55%, mild left ventricular hypertrophy, hypokinesis of the anterior and anterior lateral wall and dilated aortic root at 42 mm.  CTA shows no pulmonary embolus, 9 mm right upper lobe nodule, 4 x 3.6 cm infrarenal abdominal aortic aneurysm.   Mr. Wollman had a non-STEMI with troponins went up to the mid 300 range.  His EKG shows nonspecific ST and T wave changes.  His 2D echo does show wall motion abnormality with preserved EF.  CTA of his chest showed severe diffuse coronary calcification.  He was on DAPT for prior carotid stenting.  His coronary anatomy showed no significant obstructive disease surprisingly given the degree of calcification.  His LVEDP was 19.  Medical therapy will be recommended.  The sheath was removed and a TR band was placed on the right wrist to achieve patent hemostasis.  The patient left lab in stable condition.  Dr. Olga Millers, the patient's attending cardiologist, was notified of these results. Nanetta Batty. MD, [facility] 12/26/2022 10:42 AM    ECHOCARDIOGRAM COMPLETE  Result Date: 12/24/2022    ECHOCARDIOGRAM REPORT   Patient Name:   MAZIN EMMA Doscher Date of Exam: 12/24/2022 Medical Rec #:  [id number]       Height:       72.0 in Accession #:    [id number]      Weight:       179.9 lb Date of Birth:  1934-05-03       BSA:           2.037 m Patient Age:    88 years        BP:           132/105 mmHg Patient Gender: M  HR:           64 bpm. Exam Location:  Inpatient Procedure: 2D Echo and Intracardiac Opacification Agent Indications:    chest pain  History:        Patient has no prior history of Echocardiogram examinations.                 Stroke; Risk Factors:Dyslipidemia and Hypertension.  Sonographer:    Cathie Hoops Referring Phys: [id number] ANGELA NICOLE DUKE  Sonographer Comments: Technically difficult study due to poor echo windows. Image acquisition challenging due to respiratory motion. IMPRESSIONS  1. Left ventricular ejection fraction, by estimation, is 50 to 55%. The left ventricle has low normal function. The left ventricle demonstrates regional wall motion abnormalities (see scoring diagram/findings for description). There is mild concentric left ventricular hypertrophy. Left ventricular diastolic parameters are indeterminate. There is mild hypokinesis of the left ventricular, entire anterolateral wall and anterior wall.  2. Right ventricular systolic function is normal. The right ventricular size is normal. There is normal pulmonary artery systolic pressure.  3. The mitral valve is grossly normal. Trivial mitral valve regurgitation. No evidence of mitral stenosis.  4. The aortic valve is calcified. There is moderate calcification of the aortic valve. There is moderate thickening of the aortic valve. Aortic valve regurgitation is not visualized. Aortic valve sclerosis/calcification is present, without any evidence of aortic stenosis.  5. Aortic dilatation noted. There is mild dilatation of the aortic root, measuring 42 mm.  6. The inferior vena cava is normal in size with greater than 50% respiratory variability, suggesting right atrial pressure of 3 mmHg. Comparison(s): No prior Echocardiogram. Conclusion(s)/Recommendation(s): Low normal LVEF. With echo contrast, parasternal wall motion appears largely normal,  but on apical images there may be mild hypokinesis of the anterior and anterolateral wall. FINDINGS  Left Ventricle: Left ventricular ejection fraction, by estimation, is 50 to 55%. The left ventricle has low normal function. The left ventricle demonstrates regional wall motion abnormalities. Mild hypokinesis of the left ventricular, entire anterolateral wall and anterior wall. Definity contrast agent was given IV to delineate the left ventricular endocardial borders. The left ventricular internal cavity size was normal in size. There is mild concentric left ventricular hypertrophy. Left ventricular diastolic parameters are indeterminate. Right Ventricle: The right ventricular size is normal. No increase in right ventricular wall thickness. Right ventricular systolic function is normal. There is normal pulmonary artery systolic pressure. The tricuspid regurgitant velocity is 1.76 m/s, and  with an assumed right atrial pressure of 3 mmHg, the estimated right ventricular systolic pressure is 15.4 mmHg. Left Atrium: Left atrial size was normal in size. Right Atrium: Right atrial size was normal in size. Pericardium: There is no evidence of pericardial effusion. Mitral Valve: The mitral valve is grossly normal. Trivial mitral valve regurgitation. No evidence of mitral valve stenosis. Tricuspid Valve: The tricuspid valve is normal in structure. Tricuspid valve regurgitation is trivial. No evidence of tricuspid stenosis. Aortic Valve: The aortic valve is calcified. There is moderate calcification of the aortic valve. There is moderate thickening of the aortic valve. Aortic valve regurgitation is not visualized. Aortic valve sclerosis/calcification is present, without any  evidence of aortic stenosis. Aortic valve mean gradient measures 5.3 mmHg. Aortic valve peak gradient measures 9.6 mmHg. Aortic valve area, by VTI measures 2.10 cm. Pulmonic Valve: The pulmonic valve was not well visualized. Pulmonic valve regurgitation  is not visualized. No evidence of pulmonic stenosis. Aorta: Aortic dilatation noted. There is mild dilatation of the aortic root,  measuring 42 mm. Venous: The inferior vena cava is normal in size with greater than 50% respiratory variability, suggesting right atrial pressure of 3 mmHg. IAS/Shunts: The atrial septum is grossly normal.  LEFT VENTRICLE PLAX 2D LVIDd:         4.90 cm      Diastology LVIDs:         3.30 cm      LV e' medial:    6.96 cm/s LV PW:         1.20 cm      LV E/e' medial:  11.8 LV IVS:        1.20 cm      LV e' lateral:   7.62 cm/s LVOT diam:     1.90 cm      LV E/e' lateral: 10.7 LV SV:         67 LV SV Index:   33 LVOT Area:     2.84 cm  LV Volumes (MOD) LV vol d, MOD A2C: 70.1 ml LV vol d, MOD A4C: 135.0 ml LV vol s, MOD A2C: 48.7 ml LV vol s, MOD A4C: 62.7 ml LV SV MOD A2C:     21.4 ml LV SV MOD A4C:     135.0 ml LV SV MOD BP:      51.1 ml RIGHT VENTRICLE RV Basal diam:  3.10 cm RV Mid diam:    2.90 cm RV S prime:     11.70 cm/s TAPSE (M-mode): 1.9 cm LEFT ATRIUM             Index        RIGHT ATRIUM          Index LA diam:        3.40 cm 1.67 cm/m   RA Area:     9.45 cm LA Vol (A2C):   38.9 ml 19.10 ml/m  RA Volume:   19.00 ml 9.33 ml/m LA Vol (A4C):   47.2 ml 23.17 ml/m LA Biplane Vol: 43.1 ml 21.16 ml/m  AORTIC VALVE                     PULMONIC VALVE AV Area (Vmax):    2.09 cm      PV Vmax:       1.50 m/s AV Area (Vmean):   1.98 cm      PV Peak grad:  9.0 mmHg AV Area (VTI):     2.10 cm AV Vmax:           155.00 cm/s AV Vmean:          104.667 cm/s AV VTI:            0.319 m AV Peak Grad:      9.6 mmHg AV Mean Grad:      5.3 mmHg LVOT Vmax:         114.00 cm/s LVOT Vmean:        73.200 cm/s LVOT VTI:          0.236 m LVOT/AV VTI ratio: 0.74  AORTA Ao Root diam: 4.20 cm Ao Asc diam:  3.70 cm MITRAL VALVE               TRICUSPID VALVE MV Area (PHT): 4.80 cm    TR Peak grad:   12.4 mmHg MV Decel Time: 158 msec    TR Vmax:        176.00 cm/s MR Peak grad: 8.6 mmHg MR Vmax:  147.00 cm/s  SHUNTS MV E velocity: 81.90 cm/s  Systemic VTI:  0.24 m MV A velocity: 84.00 cm/s  Systemic Diam: 1.90 cm MV E/A ratio:  0.98 Jodelle Red MD Electronically signed by Jodelle Red MD Signature Date/Time: 12/24/2022/8:53:51 PM    Final    [place] EKG SITE RITE  Result Date: 12/24/2022 If Site Rite image not attached, placement could not be confirmed due to current cardiac rhythm.  CT Angio Chest/Abd/Pel for Dissection W and/or Wo Contrast  Result Date: 12/23/2022 CLINICAL DATA:  Chest pain. EXAM: CT ANGIOGRAPHY CHEST, ABDOMEN AND PELVIS TECHNIQUE: Non-contrast CT of the chest was initially obtained. Multidetector CT imaging through the chest, abdomen and pelvis was performed using the standard protocol during bolus administration of intravenous contrast. Multiplanar reconstructed images and MIPs were obtained and reviewed to evaluate the vascular anatomy. RADIATION DOSE REDUCTION: This exam was performed according to the departmental dose-optimization program which includes automated exposure control, adjustment of the mA and/or kV according to patient size and/or use of iterative reconstruction technique. CONTRAST:  OMNIPAQUE IOHEXOL 350 MG/ML SOLN COMPARISON:  April 12, 2018 FINDINGS: CTA CHEST FINDINGS Cardiovascular: There is marked severity calcification of the aortic arch, without evidence of aortic aneurysm or dissection. Satisfactory opacification of the pulmonary arteries to the segmental level. No evidence of pulmonary embolism. Normal heart size with marked severity coronary artery calcification. No pericardial effusion. Mediastinum/Nodes: No enlarged mediastinal, hilar, or axillary lymph nodes. Thyroid gland, trachea, and esophagus demonstrate no significant findings. Lungs/Pleura: A 9 mm anterolateral right upper lobe noncalcified lung nodule is seen. There is no evidence of an acute infiltrate, pleural effusion or pneumothorax. Musculoskeletal: No chest wall  abnormality. No acute or significant osseous findings. Review of the MIP images confirms the above findings. CTA ABDOMEN AND PELVIS FINDINGS VASCULAR Aorta: 4.0 cm x 3.6 cm aneurysmal dilatation of the infrarenal abdominal aorta without evidence of dissection, vasculitis or significant stenosis. Celiac: Patent without evidence of aneurysm, dissection, vasculitis or significant stenosis. SMA: Patent without evidence of aneurysm, dissection, vasculitis or significant stenosis. Renals: Both renal arteries are patent without evidence of aneurysm, dissection, vasculitis, fibromuscular dysplasia or significant stenosis. IMA: Patent without evidence of aneurysm, dissection, vasculitis or significant stenosis. Inflow: Patent without evidence of aneurysm, dissection, vasculitis or significant stenosis. Veins: No obvious venous abnormality within the limitations of this arterial phase study. Review of the MIP images confirms the above findings. NON-VASCULAR Hepatobiliary: No focal liver abnormality is seen. No gallstones, gallbladder wall thickening, or biliary dilatation. Pancreas: Unremarkable. No pancreatic ductal dilatation or surrounding inflammatory changes. Spleen: Normal in size without focal abnormality. Adrenals/Urinary Tract: Adrenal glands are unremarkable. Kidneys are normal, without renal calculi, focal lesion, or hydronephrosis. Bladder is unremarkable. Stomach/Bowel: There is a small hiatal hernia. Surgically anastomosed bowel is seen within the anterior aspect of the mid to lower right abdomen. No evidence of bowel wall thickening, distention, or inflammatory changes. Noninflamed diverticula are seen throughout the sigmoid colon. Lymphatic: No abnormal abdominal or pelvic lymph nodes are identified. Reproductive: Prostate gland is mildly enlarged. Other: No abdominal wall hernia or abnormality. No abdominopelvic ascites. Musculoskeletal: No acute or significant osseous findings. Review of the MIP images  confirms the above findings. IMPRESSION: 1. No evidence of pulmonary embolism or other acute intrathoracic process. 2. 9 mm anterolateral right upper lobe noncalcified lung nodule. Consider one of the following in 3 months for both low-risk and high-risk individuals: (a) repeat chest CT, (b) follow-up PET-CT, or (c) tissue sampling. This recommendation follows the  consensus statement: Guidelines for Management of Incidental Pulmonary Nodules Detected on CT Images: From the Fleischner Society 2017; Radiology 2017; 284:228-243. 3. 4.0 cm x 3.6 cm infrarenal abdominal aortic aneurysm. 4. Sigmoid diverticulosis. 5. Small hiatal hernia. 6. Aortic atherosclerosis. Aortic Atherosclerosis (ICD10-I70.0). Electronically Signed   By: Virgina Norfolk M.D.   On: 12/23/2022 18:56   DG Chest Port 1 View  Result Date: 12/23/2022 CLINICAL DATA:  Chest pain EXAM: PORTABLE CHEST 1 VIEW COMPARISON:  05/09/2021 FINDINGS: The heart size and mediastinal contours are within normal limits. Spiculated appearing nodular opacity projecting over the right pulmonary apex. No acute appearing airspace opacity. Small, definitively benign calcified nodules of the left upper lobe. The visualized skeletal structures are unremarkable. IMPRESSION: 1. No acute appearing airspace opacity. 2. Spiculated appearing nodular opacity projecting over the right pulmonary apex. This is possibly a rib end calcification or other non parenchymal finding; recommend nonemergent CT to further evaluate. Electronically Signed   By: Delanna Ahmadi M.D.   On: 12/23/2022 17:03   Disposition   Pt is being discharged home today in good condition.  Follow-up Plans & Appointments     Follow-up Information     Loel Dubonnet, NP Follow up on 01/10/2023.   Specialty: Cardiology Why: 11:20AM. Cardiology post hospital follow up Contact information: [address] [place] [address] [phone]                Discharge Instructions     Amb  Referral to Cardiac Rehabilitation   Complete by: As directed    Diagnosis: NSTEMI   After initial evaluation and assessments completed: Virtual Based Care may be provided alone or in conjunction with Phase 2 Cardiac Rehab based on patient barriers.: Yes   Intensive Cardiac Rehabilitation (ICR) [place] location only OR Traditional Cardiac Rehabilitation (TCR) *If criteria for ICR are not met will enroll in TCR [facility] only): Yes        Discharge Medications   Allergies as of 12/26/2022       Reactions   Bupropion Other (See Comments)   Unsure about alleries   Valproic Acid Other (See Comments)   Unsure about this        Medication List     STOP taking these medications    flecainide 50 MG tablet Commonly known as: TAMBOCOR   ramipril 10 MG capsule Commonly known as: ALTACE       TAKE these medications    ALPRAZolam 0.5 MG tablet Commonly known as: XANAX Take 0.5-1 mg by mouth See admin instructions. Take 0.5 mg in the morning and 1 mg at bedtime   amLODipine 5 MG tablet Commonly known as: NORVASC Take 5 mg by mouth daily.   aspirin 81 MG tablet Take 81 mg by mouth daily.   atorvastatin 80 MG tablet Commonly known as: LIPITOR Take 1 tablet (80 mg total) by mouth daily. Start taking on: December 27, 2022 What changed:  medication strength how much to take   CENTRUM ADULTS PO Take 1 tablet by mouth daily.   clopidogrel 75 MG tablet Commonly known as: PLAVIX Take 75 mg by mouth daily.   FISH OIL PO Take 1 capsule by mouth daily.   ibuprofen 200 MG tablet Commonly known as: ADVIL Take 400 mg by mouth as needed for moderate pain.   mirtazapine 7.5 MG tablet Commonly known as: REMERON TAKE 1 TABLET(7.5 MG) BY MOUTH AT BEDTIME What changed: See the new instructions.   sertraline 50 MG tablet Commonly known as:  ZOLOFT TAKE 3 TABLETS(150 MG) BY MOUTH DAILY What changed: See the new instructions.   TUMS PO Take 2 tablets by mouth as needed (heartburn).    ALKA-SELTZER ANTACID PO Take 2 tablets by mouth as needed (heartburn).           Outstanding Labs/Studies   N/A  Duration of Discharge Encounter   Greater than 30 minutes including physician time.  Ramond DialSigned, Dennison Mcdaid, PA 12/26/2022, 3:01 PM

## 2022-12-26 NOTE — H&P (View-Only) (Signed)
Rounding Note    Patient Name: Gregory [name] Date of Encounter: 12/26/2022  [place] Cardiologist: Buford Dresser, MD   Subjective   No CP or dyspnea  Inpatient Medications    Scheduled Meds:  amLODipine  5 mg Oral Daily   [START ON 12/27/2022] aspirin EC  81 mg Oral Daily   atorvastatin  80 mg Oral Daily   clopidogrel  75 mg Oral Daily   mirtazapine  7.5 mg Oral QHS   sertraline  150 mg Oral Daily   sodium chloride flush  3 mL Intravenous Q12H   Continuous Infusions:  sodium chloride     sodium chloride 1 mL/kg/hr (12/26/22 0719)   heparin 1,200 Units/hr (12/26/22 0327)   PRN Meds: sodium chloride, acetaminophen, ALPRAZolam, nitroGLYCERIN, ondansetron (ZOFRAN) IV, sodium chloride flush   Vital Signs    Vitals:   12/25/22 1634 12/25/22 2004 12/26/22 0528 12/26/22 0846  BP: 131/75 134/63 102/81 112/69  Pulse: (!) 58 64 (!) 55 (!) 50  Resp: 18 20 18 16   Temp: 98 F (36.7 C) 98.4 F (36.9 C) 98 F (36.7 C) 97.9 F (36.6 C)  TempSrc: Oral Oral Oral Oral  SpO2: 96% 95% 93% 98%  Weight:   83.9 kg   Height:        Intake/Output Summary (Last 24 hours) at 12/26/2022 0926 Last data filed at 12/26/2022 0327 Gross per 24 hour  Intake 784.86 ml  Output 1450 ml  Net -665.14 ml      12/26/2022    5:28 AM 12/25/2022    5:09 AM 12/23/2022    7:00 PM  Last 3 Weights  Weight (lbs) 185 lb 187 lb 14.4 oz 179 lb 14.3 oz  Weight (kg) 83.915 kg 85.231 kg 81.6 kg      Telemetry    Sinus - Personally Reviewed  Physical Exam   GEN: No acute distress.   Neck: No JVD Cardiac: RRR, no murmurs, rubs, or gallops.  Respiratory: Clear to auscultation bilaterally. GI: Soft, nontender, non-distended  MS: No edema Neuro:  Nonfocal  Psych: Normal affect   Labs    High Sensitivity Troponin:   Recent Labs  Lab 12/23/22 1636 12/23/22 1849 12/24/22 1313 12/24/22 1508  TROPONINIHS 356* 348* 155* 191*     Chemistry Recent Labs  Lab 12/24/22 1313  12/25/22 0123 12/26/22 0156  NA 133* 135 136  K 4.2 3.9 3.9  CL 102 103 104  CO2 23 23 22   GLUCOSE 188* 108* 106*  BUN 14 18 22   CREATININE 1.39* 1.36* 1.39*  CALCIUM 8.8* 8.9 9.1  GFRNONAA 49* 51* 49*  ANIONGAP 8 9 10     Lipids  Recent Labs  Lab 12/25/22 0123  CHOL 120  TRIG 102  HDL 37*  LDLCALC 63  CHOLHDL 3.2    Hematology Recent Labs  Lab 12/24/22 1313 12/25/22 0123 12/26/22 0156  WBC 7.0 8.1 7.2  RBC 3.77* 3.94* 4.00*  HGB 11.6* 12.1* 12.5*  HCT 35.6* 36.8* 37.0*  MCV 94.4 93.4 92.5  MCH 30.8 30.7 31.3  MCHC 32.6 32.9 33.8  RDW 12.5 12.3 12.3  PLT 164 171 165   BNP Recent Labs  Lab 12/24/22 1313  BNP 326.2*    DDimer  Recent Labs  Lab 12/23/22 1636  DDIMER 1.13*     Radiology    ECHOCARDIOGRAM COMPLETE  Result Date: 12/24/2022    ECHOCARDIOGRAM REPORT   Patient Name:   Gregory [name] Date of Exam: 12/24/2022 Medical Rec #:  [id number]       Height:       72.0 in Accession #:    [id number]      Weight:       179.9 lb Date of Birth:  08/16/1934       BSA:          2.037 m Patient Age:    88 years        BP:           132/105 mmHg Patient Gender: M               HR:           64 bpm. Exam Location:  Inpatient Procedure: 2D Echo and Intracardiac Opacification Agent Indications:    chest pain  History:        Patient has no prior history of Echocardiogram examinations.                 Stroke; Risk Factors:Dyslipidemia and Hypertension.  Sonographer:    Cathie Hoops Referring Phys: [id number] ANGELA NICOLE DUKE  Sonographer Comments: Technically difficult study due to poor echo windows. Image acquisition challenging due to respiratory motion. IMPRESSIONS  1. Left ventricular ejection fraction, by estimation, is 50 to 55%. The left ventricle has low normal function. The left ventricle demonstrates regional wall motion abnormalities (see scoring diagram/findings for description). There is mild concentric left ventricular hypertrophy. Left ventricular diastolic  parameters are indeterminate. There is mild hypokinesis of the left ventricular, entire anterolateral wall and anterior wall.  2. Right ventricular systolic function is normal. The right ventricular size is normal. There is normal pulmonary artery systolic pressure.  3. The mitral valve is grossly normal. Trivial mitral valve regurgitation. No evidence of mitral stenosis.  4. The aortic valve is calcified. There is moderate calcification of the aortic valve. There is moderate thickening of the aortic valve. Aortic valve regurgitation is not visualized. Aortic valve sclerosis/calcification is present, without any evidence of aortic stenosis.  5. Aortic dilatation noted. There is mild dilatation of the aortic root, measuring 42 mm.  6. The inferior vena cava is normal in size with greater than 50% respiratory variability, suggesting right atrial pressure of 3 mmHg. Comparison(s): No prior Echocardiogram. Conclusion(s)/Recommendation(s): Low normal LVEF. With echo contrast, parasternal wall motion appears largely normal, but on apical images there may be mild hypokinesis of the anterior and anterolateral wall. FINDINGS  Left Ventricle: Left ventricular ejection fraction, by estimation, is 50 to 55%. The left ventricle has low normal function. The left ventricle demonstrates regional wall motion abnormalities. Mild hypokinesis of the left ventricular, entire anterolateral wall and anterior wall. Definity contrast agent was given IV to delineate the left ventricular endocardial borders. The left ventricular internal cavity size was normal in size. There is mild concentric left ventricular hypertrophy. Left ventricular diastolic parameters are indeterminate. Right Ventricle: The right ventricular size is normal. No increase in right ventricular wall thickness. Right ventricular systolic function is normal. There is normal pulmonary artery systolic pressure. The tricuspid regurgitant velocity is 1.76 m/s, and  with an  assumed right atrial pressure of 3 mmHg, the estimated right ventricular systolic pressure is 15.4 mmHg. Left Atrium: Left atrial size was normal in size. Right Atrium: Right atrial size was normal in size. Pericardium: There is no evidence of pericardial effusion. Mitral Valve: The mitral valve is grossly normal. Trivial mitral valve regurgitation. No evidence of mitral valve stenosis. Tricuspid Valve: The tricuspid valve is normal in structure. Tricuspid valve  regurgitation is trivial. No evidence of tricuspid stenosis. Aortic Valve: The aortic valve is calcified. There is moderate calcification of the aortic valve. There is moderate thickening of the aortic valve. Aortic valve regurgitation is not visualized. Aortic valve sclerosis/calcification is present, without any  evidence of aortic stenosis. Aortic valve mean gradient measures 5.3 mmHg. Aortic valve peak gradient measures 9.6 mmHg. Aortic valve area, by VTI measures 2.10 cm. Pulmonic Valve: The pulmonic valve was not well visualized. Pulmonic valve regurgitation is not visualized. No evidence of pulmonic stenosis. Aorta: Aortic dilatation noted. There is mild dilatation of the aortic root, measuring 42 mm. Venous: The inferior vena cava is normal in size with greater than 50% respiratory variability, suggesting right atrial pressure of 3 mmHg. IAS/Shunts: The atrial septum is grossly normal.  LEFT VENTRICLE PLAX 2D LVIDd:         4.90 cm      Diastology LVIDs:         3.30 cm      LV e' medial:    6.96 cm/s LV PW:         1.20 cm      LV E/e' medial:  11.8 LV IVS:        1.20 cm      LV e' lateral:   7.62 cm/s LVOT diam:     1.90 cm      LV E/e' lateral: 10.7 LV SV:         67 LV SV Index:   33 LVOT Area:     2.84 cm  LV Volumes (MOD) LV vol d, MOD A2C: 70.1 ml LV vol d, MOD A4C: 135.0 ml LV vol s, MOD A2C: 48.7 ml LV vol s, MOD A4C: 62.7 ml LV SV MOD A2C:     21.4 ml LV SV MOD A4C:     135.0 ml LV SV MOD BP:      51.1 ml RIGHT VENTRICLE RV Basal diam:   3.10 cm RV Mid diam:    2.90 cm RV S prime:     11.70 cm/s TAPSE (M-mode): 1.9 cm LEFT ATRIUM             Index        RIGHT ATRIUM          Index LA diam:        3.40 cm 1.67 cm/m   RA Area:     9.45 cm LA Vol (A2C):   38.9 ml 19.10 ml/m  RA Volume:   19.00 ml 9.33 ml/m LA Vol (A4C):   47.2 ml 23.17 ml/m LA Biplane Vol: 43.1 ml 21.16 ml/m  AORTIC VALVE                     PULMONIC VALVE AV Area (Vmax):    2.09 cm      PV Vmax:       1.50 m/s AV Area (Vmean):   1.98 cm      PV Peak grad:  9.0 mmHg AV Area (VTI):     2.10 cm AV Vmax:           155.00 cm/s AV Vmean:          104.667 cm/s AV VTI:            0.319 m AV Peak Grad:      9.6 mmHg AV Mean Grad:      5.3 mmHg LVOT Vmax:         114.00  cm/s LVOT Vmean:        73.200 cm/s LVOT VTI:          0.236 m LVOT/AV VTI ratio: 0.74  AORTA Ao Root diam: 4.20 cm Ao Asc diam:  3.70 cm MITRAL VALVE               TRICUSPID VALVE MV Area (PHT): 4.80 cm    TR Peak grad:   12.4 mmHg MV Decel Time: 158 msec    TR Vmax:        176.00 cm/s MR Peak grad: 8.6 mmHg MR Vmax:      147.00 cm/s  SHUNTS MV E velocity: 81.90 cm/s  Systemic VTI:  0.24 m MV A velocity: 84.00 cm/s  Systemic Diam: 1.90 cm MV E/A ratio:  0.98 Buford Dresser MD Electronically signed by Buford Dresser MD Signature Date/Time: 12/24/2022/8:53:51 PM    Final    [place] EKG SITE RITE  Result Date: 12/24/2022 If Site Rite image not attached, placement could not be confirmed due to current cardiac rhythm.    Patient Profile     89 y.o. male with past medical history of CVA, carotid artery disease status post carotid endarterectomy, abdominal aortic aneurysm, hypertension admitted with non-ST elevation myocardial infarction.  Echocardiogram shows ejection fraction 50 to 55%, mild left ventricular hypertrophy, hypokinesis of the anterior and anterior lateral wall and dilated aortic root at 42 mm.  CTA shows no pulmonary embolus, 9 mm right upper lobe nodule, 4 x 3.6 cm infrarenal abdominal aortic  aneurysm.  Assessment & Plan    1 Non-ST elevation myocardial infarction-patient remains pain-free.  Continue aspirin, heparin and statin.  Patient is also on Plavix.  Plan is for cardiac catheterization per Dr. Harrell Gave.  The risk and benefits including myocardial infarction, CVA and death discussed and patient agrees to proceed.  2 hypertension-patient's blood pressure is controlled.  Continue present medications and follow-up.  3 lung nodule-patient will need follow-up CT in 3 months.  4 abdominal aortic aneurysm-patient will need follow-up ultrasound in 1 year.  5 hyperlipidemia-continue statin.  6 carotid artery disease-continue aspirin, Plavix and statin.  Note patient has been on chronic Plavix at home.  For questions or updates, please contact [facility] Please consult [email or web address] for contact info under        Signed, Kirk Ruths, MD  12/26/2022, 9:26 AM

## 2022-12-26 NOTE — Interval H&P Note (Signed)
Cath Lab Visit (complete for each Cath Lab visit)  Clinical Evaluation Leading to the Procedure:   ACS: Yes.    Non-ACS:    Anginal Classification: CCS II  Anti-ischemic medical therapy: No Therapy  Non-Invasive Test Results: No non-invasive testing performed  Prior CABG: No previous CABG      History and Physical Interval Note:  12/26/2022 9:48 AM  Gregory [name]  has presented today for surgery, with the diagnosis of NSTEMI.  The various methods of treatment have been discussed with the patient and family. After consideration of risks, benefits and other options for treatment, the patient has consented to  Procedure(s): LEFT HEART CATH AND CORONARY ANGIOGRAPHY (N/A) as a surgical intervention.  The patient's history has been reviewed, patient examined, no change in status, stable for surgery.  I have reviewed the patient's chart and labs.  Questions were answered to the patient's satisfaction.     [name]

## 2022-12-26 NOTE — Progress Notes (Addendum)
Rounding Note    Patient Name: NGOC DAUGHTRIDGE Date of Encounter: 12/26/2022  [place] Cardiologist: Buford Dresser, MD   Subjective   No CP or dyspnea  Inpatient Medications    Scheduled Meds:  amLODipine  5 mg Oral Daily   [START ON 12/27/2022] aspirin EC  81 mg Oral Daily   atorvastatin  80 mg Oral Daily   clopidogrel  75 mg Oral Daily   mirtazapine  7.5 mg Oral QHS   sertraline  150 mg Oral Daily   sodium chloride flush  3 mL Intravenous Q12H   Continuous Infusions:  sodium chloride     sodium chloride 1 mL/kg/hr (12/26/22 0719)   heparin 1,200 Units/hr (12/26/22 0327)   PRN Meds: sodium chloride, acetaminophen, ALPRAZolam, nitroGLYCERIN, ondansetron (ZOFRAN) IV, sodium chloride flush   Vital Signs    Vitals:   12/25/22 1634 12/25/22 2004 12/26/22 0528 12/26/22 0846  BP: 131/75 134/63 102/81 112/69  Pulse: (!) 58 64 (!) 55 (!) 50  Resp: 18 20 18 16   Temp: 98 F (36.7 C) 98.4 F (36.9 C) 98 F (36.7 C) 97.9 F (36.6 C)  TempSrc: Oral Oral Oral Oral  SpO2: 96% 95% 93% 98%  Weight:   83.9 kg   Height:        Intake/Output Summary (Last 24 hours) at 12/26/2022 0926 Last data filed at 12/26/2022 0327 Gross per 24 hour  Intake 784.86 ml  Output 1450 ml  Net -665.14 ml      12/26/2022    5:28 AM 12/25/2022    5:09 AM 12/23/2022    7:00 PM  Last 3 Weights  Weight (lbs) 185 lb 187 lb 14.4 oz 179 lb 14.3 oz  Weight (kg) 83.915 kg 85.231 kg 81.6 kg      Telemetry    Sinus - Personally Reviewed  Physical Exam   GEN: No acute distress.   Neck: No JVD Cardiac: RRR, no murmurs, rubs, or gallops.  Respiratory: Clear to auscultation bilaterally. GI: Soft, nontender, non-distended  MS: No edema Neuro:  Nonfocal  Psych: Normal affect   Labs    High Sensitivity Troponin:   Recent Labs  Lab 12/23/22 1636 12/23/22 1849 12/24/22 1313 12/24/22 1508  TROPONINIHS 356* 348* 155* 191*     Chemistry Recent Labs  Lab 12/24/22 1313  12/25/22 0123 12/26/22 0156  NA 133* 135 136  K 4.2 3.9 3.9  CL 102 103 104  CO2 23 23 22   GLUCOSE 188* 108* 106*  BUN 14 18 22   CREATININE 1.39* 1.36* 1.39*  CALCIUM 8.8* 8.9 9.1  GFRNONAA 49* 51* 49*  ANIONGAP 8 9 10     Lipids  Recent Labs  Lab 12/25/22 0123  CHOL 120  TRIG 102  HDL 37*  LDLCALC 63  CHOLHDL 3.2    Hematology Recent Labs  Lab 12/24/22 1313 12/25/22 0123 12/26/22 0156  WBC 7.0 8.1 7.2  RBC 3.77* 3.94* 4.00*  HGB 11.6* 12.1* 12.5*  HCT 35.6* 36.8* 37.0*  MCV 94.4 93.4 92.5  MCH 30.8 30.7 31.3  MCHC 32.6 32.9 33.8  RDW 12.5 12.3 12.3  PLT 164 171 165   BNP Recent Labs  Lab 12/24/22 1313  BNP 326.2*    DDimer  Recent Labs  Lab 12/23/22 1636  DDIMER 1.13*     Radiology    ECHOCARDIOGRAM COMPLETE  Result Date: 12/24/2022    ECHOCARDIOGRAM REPORT   Patient Name:   OLUWANIFEMI SUSMAN Joss Date of Exam: 12/24/2022 Medical Rec #:  [id number]       Height:       72.0 in Accession #:    [id number]      Weight:       179.9 lb Date of Birth:  08/16/1934       BSA:          2.037 m Patient Age:    89 years        BP:           132/105 mmHg Patient Gender: M               HR:           64 bpm. Exam Location:  Inpatient Procedure: 2D Echo and Intracardiac Opacification Agent Indications:    chest pain  History:        Patient has no prior history of Echocardiogram examinations.                 Stroke; Risk Factors:Dyslipidemia and Hypertension.  Sonographer:    Cathie Hoops Referring Phys: [id number] ANGELA NICOLE DUKE  Sonographer Comments: Technically difficult study due to poor echo windows. Image acquisition challenging due to respiratory motion. IMPRESSIONS  1. Left ventricular ejection fraction, by estimation, is 50 to 55%. The left ventricle has low normal function. The left ventricle demonstrates regional wall motion abnormalities (see scoring diagram/findings for description). There is mild concentric left ventricular hypertrophy. Left ventricular diastolic  parameters are indeterminate. There is mild hypokinesis of the left ventricular, entire anterolateral wall and anterior wall.  2. Right ventricular systolic function is normal. The right ventricular size is normal. There is normal pulmonary artery systolic pressure.  3. The mitral valve is grossly normal. Trivial mitral valve regurgitation. No evidence of mitral stenosis.  4. The aortic valve is calcified. There is moderate calcification of the aortic valve. There is moderate thickening of the aortic valve. Aortic valve regurgitation is not visualized. Aortic valve sclerosis/calcification is present, without any evidence of aortic stenosis.  5. Aortic dilatation noted. There is mild dilatation of the aortic root, measuring 42 mm.  6. The inferior vena cava is normal in size with greater than 50% respiratory variability, suggesting right atrial pressure of 3 mmHg. Comparison(s): No prior Echocardiogram. Conclusion(s)/Recommendation(s): Low normal LVEF. With echo contrast, parasternal wall motion appears largely normal, but on apical images there may be mild hypokinesis of the anterior and anterolateral wall. FINDINGS  Left Ventricle: Left ventricular ejection fraction, by estimation, is 50 to 55%. The left ventricle has low normal function. The left ventricle demonstrates regional wall motion abnormalities. Mild hypokinesis of the left ventricular, entire anterolateral wall and anterior wall. Definity contrast agent was given IV to delineate the left ventricular endocardial borders. The left ventricular internal cavity size was normal in size. There is mild concentric left ventricular hypertrophy. Left ventricular diastolic parameters are indeterminate. Right Ventricle: The right ventricular size is normal. No increase in right ventricular wall thickness. Right ventricular systolic function is normal. There is normal pulmonary artery systolic pressure. The tricuspid regurgitant velocity is 1.76 m/s, and  with an  assumed right atrial pressure of 3 mmHg, the estimated right ventricular systolic pressure is 15.4 mmHg. Left Atrium: Left atrial size was normal in size. Right Atrium: Right atrial size was normal in size. Pericardium: There is no evidence of pericardial effusion. Mitral Valve: The mitral valve is grossly normal. Trivial mitral valve regurgitation. No evidence of mitral valve stenosis. Tricuspid Valve: The tricuspid valve is normal in structure. Tricuspid valve  regurgitation is trivial. No evidence of tricuspid stenosis. Aortic Valve: The aortic valve is calcified. There is moderate calcification of the aortic valve. There is moderate thickening of the aortic valve. Aortic valve regurgitation is not visualized. Aortic valve sclerosis/calcification is present, without any  evidence of aortic stenosis. Aortic valve mean gradient measures 5.3 mmHg. Aortic valve peak gradient measures 9.6 mmHg. Aortic valve area, by VTI measures 2.10 cm. Pulmonic Valve: The pulmonic valve was not well visualized. Pulmonic valve regurgitation is not visualized. No evidence of pulmonic stenosis. Aorta: Aortic dilatation noted. There is mild dilatation of the aortic root, measuring 42 mm. Venous: The inferior vena cava is normal in size with greater than 50% respiratory variability, suggesting right atrial pressure of 3 mmHg. IAS/Shunts: The atrial septum is grossly normal.  LEFT VENTRICLE PLAX 2D LVIDd:         4.90 cm      Diastology LVIDs:         3.30 cm      LV e' medial:    6.96 cm/s LV PW:         1.20 cm      LV E/e' medial:  11.8 LV IVS:        1.20 cm      LV e' lateral:   7.62 cm/s LVOT diam:     1.90 cm      LV E/e' lateral: 10.7 LV SV:         67 LV SV Index:   33 LVOT Area:     2.84 cm  LV Volumes (MOD) LV vol d, MOD A2C: 70.1 ml LV vol d, MOD A4C: 135.0 ml LV vol s, MOD A2C: 48.7 ml LV vol s, MOD A4C: 62.7 ml LV SV MOD A2C:     21.4 ml LV SV MOD A4C:     135.0 ml LV SV MOD BP:      51.1 ml RIGHT VENTRICLE RV Basal diam:   3.10 cm RV Mid diam:    2.90 cm RV S prime:     11.70 cm/s TAPSE (M-mode): 1.9 cm LEFT ATRIUM             Index        RIGHT ATRIUM          Index LA diam:        3.40 cm 1.67 cm/m   RA Area:     9.45 cm LA Vol (A2C):   38.9 ml 19.10 ml/m  RA Volume:   19.00 ml 9.33 ml/m LA Vol (A4C):   47.2 ml 23.17 ml/m LA Biplane Vol: 43.1 ml 21.16 ml/m  AORTIC VALVE                     PULMONIC VALVE AV Area (Vmax):    2.09 cm      PV Vmax:       1.50 m/s AV Area (Vmean):   1.98 cm      PV Peak grad:  9.0 mmHg AV Area (VTI):     2.10 cm AV Vmax:           155.00 cm/s AV Vmean:          104.667 cm/s AV VTI:            0.319 m AV Peak Grad:      9.6 mmHg AV Mean Grad:      5.3 mmHg LVOT Vmax:         114.00  cm/s LVOT Vmean:        73.200 cm/s LVOT VTI:          0.236 m LVOT/AV VTI ratio: 0.74  AORTA Ao Root diam: 4.20 cm Ao Asc diam:  3.70 cm MITRAL VALVE               TRICUSPID VALVE MV Area (PHT): 4.80 cm    TR Peak grad:   12.4 mmHg MV Decel Time: 158 msec    TR Vmax:        176.00 cm/s MR Peak grad: 8.6 mmHg MR Vmax:      147.00 cm/s  SHUNTS MV E velocity: 81.90 cm/s  Systemic VTI:  0.24 m MV A velocity: 84.00 cm/s  Systemic Diam: 1.90 cm MV E/A ratio:  0.98 Buford Dresser MD Electronically signed by Buford Dresser MD Signature Date/Time: 12/24/2022/8:53:51 PM    Final    [place] EKG SITE RITE  Result Date: 12/24/2022 If Site Rite image not attached, placement could not be confirmed due to current cardiac rhythm.    Patient Profile     89 y.o. male with past medical history of CVA, carotid artery disease status post carotid endarterectomy, abdominal aortic aneurysm, hypertension admitted with non-ST elevation myocardial infarction.  Echocardiogram shows ejection fraction 50 to 55%, mild left ventricular hypertrophy, hypokinesis of the anterior and anterior lateral wall and dilated aortic root at 42 mm.  CTA shows no pulmonary embolus, 9 mm right upper lobe nodule, 4 x 3.6 cm infrarenal abdominal aortic  aneurysm.  Assessment & Plan    1 Non-ST elevation myocardial infarction-patient remains pain-free.  Continue aspirin, heparin and statin.  Patient is also on Plavix.  Plan is for cardiac catheterization per Dr. Harrell Gave.  The risk and benefits including myocardial infarction, CVA and death discussed and patient agrees to proceed.  2 hypertension-patient's blood pressure is controlled.  Continue present medications and follow-up.  3 lung nodule-patient will need follow-up CT in 3 months.  4 abdominal aortic aneurysm-patient will need follow-up ultrasound in 1 year.  5 hyperlipidemia-continue statin.  6 carotid artery disease-continue aspirin, Plavix and statin.  Note patient has been on chronic Plavix at home.  7 pulmonary nodule-needs follow-up CT in 3 months.  For questions or updates, please contact [facility] Please consult [email or web address] for contact info under        Signed, Kirk Ruths, MD  12/26/2022, 9:26 AM

## 2022-12-27 LAB — LIPOPROTEIN A (LPA): Lipoprotein (a): 18.7 nmol/L (ref <75.0)

## 2022-12-28 ENCOUNTER — Other Ambulatory Visit: Payer: Self-pay

## 2022-12-28 ENCOUNTER — Inpatient Hospital Stay (HOSPITAL_BASED_OUTPATIENT_CLINIC_OR_DEPARTMENT_OTHER)
Admission: EM | Admit: 2022-12-28 | Discharge: 2022-12-30 | DRG: 282 | Disposition: A | Discharge status: Home / Self Care | Payer: PPO | Attending: Cardiovascular Disease | Admitting: Cardiovascular Disease

## 2022-12-28 ENCOUNTER — Encounter (HOSPITAL_BASED_OUTPATIENT_CLINIC_OR_DEPARTMENT_OTHER): Payer: Self-pay

## 2022-12-28 ENCOUNTER — Emergency Department (HOSPITAL_BASED_OUTPATIENT_CLINIC_OR_DEPARTMENT_OTHER): Payer: PPO | Admitting: Radiology

## 2022-12-28 DIAGNOSIS — E785 Hyperlipidemia, unspecified: Secondary | ICD-10-CM | POA: Diagnosis present

## 2022-12-28 DIAGNOSIS — I129 Hypertensive chronic kidney disease with stage 1 through stage 4 chronic kidney disease, or unspecified chronic kidney disease: Secondary | ICD-10-CM | POA: Diagnosis not present

## 2022-12-28 DIAGNOSIS — I440 Atrioventricular block, first degree: Secondary | ICD-10-CM | POA: Diagnosis present

## 2022-12-28 DIAGNOSIS — Z7902 Long term (current) use of antithrombotics/antiplatelets: Secondary | ICD-10-CM | POA: Diagnosis not present

## 2022-12-28 DIAGNOSIS — R739 Hyperglycemia, unspecified: Secondary | ICD-10-CM | POA: Insufficient documentation

## 2022-12-28 DIAGNOSIS — I779 Disorder of arteries and arterioles, unspecified: Secondary | ICD-10-CM | POA: Insufficient documentation

## 2022-12-28 DIAGNOSIS — Z888 Allergy status to other drugs, medicaments and biological substances status: Secondary | ICD-10-CM

## 2022-12-28 DIAGNOSIS — I214 Non-ST elevation (NSTEMI) myocardial infarction: Secondary | ICD-10-CM | POA: Diagnosis present

## 2022-12-28 DIAGNOSIS — Z7982 Long term (current) use of aspirin: Secondary | ICD-10-CM

## 2022-12-28 DIAGNOSIS — Z8249 Family history of ischemic heart disease and other diseases of the circulatory system: Secondary | ICD-10-CM

## 2022-12-28 DIAGNOSIS — Z87442 Personal history of urinary calculi: Secondary | ICD-10-CM | POA: Diagnosis not present

## 2022-12-28 DIAGNOSIS — Z8673 Personal history of transient ischemic attack (TIA), and cerebral infarction without residual deficits: Secondary | ICD-10-CM | POA: Diagnosis not present

## 2022-12-28 DIAGNOSIS — Z79899 Other long term (current) drug therapy: Secondary | ICD-10-CM

## 2022-12-28 DIAGNOSIS — I25110 Atherosclerotic heart disease of native coronary artery with unstable angina pectoris: Secondary | ICD-10-CM | POA: Insufficient documentation

## 2022-12-28 DIAGNOSIS — F32A Depression, unspecified: Secondary | ICD-10-CM | POA: Diagnosis not present

## 2022-12-28 DIAGNOSIS — I2584 Coronary atherosclerosis due to calcified coronary lesion: Secondary | ICD-10-CM | POA: Diagnosis not present

## 2022-12-28 DIAGNOSIS — I25118 Atherosclerotic heart disease of native coronary artery with other forms of angina pectoris: Secondary | ICD-10-CM | POA: Diagnosis present

## 2022-12-28 DIAGNOSIS — N1831 Chronic kidney disease, stage 3a: Secondary | ICD-10-CM | POA: Insufficient documentation

## 2022-12-28 DIAGNOSIS — I714 Abdominal aortic aneurysm, without rupture, unspecified: Secondary | ICD-10-CM | POA: Insufficient documentation

## 2022-12-28 DIAGNOSIS — R911 Solitary pulmonary nodule: Secondary | ICD-10-CM | POA: Insufficient documentation

## 2022-12-28 DIAGNOSIS — I2510 Atherosclerotic heart disease of native coronary artery without angina pectoris: Secondary | ICD-10-CM | POA: Insufficient documentation

## 2022-12-28 DIAGNOSIS — I10 Essential (primary) hypertension: Secondary | ICD-10-CM | POA: Diagnosis present

## 2022-12-28 LAB — TROPONIN I (HIGH SENSITIVITY)
Troponin I (High Sensitivity): 820 ng/L (ref <18)
Troponin I (High Sensitivity): 669 ng/L (ref <18)

## 2022-12-28 LAB — CBC
HCT: 39.2 % (ref 39.0–52.0)
Hemoglobin: 13.2 g/dL (ref 13.0–17.0)
MCH: 31.6 pg (ref 26.0–34.0)
MCHC: 33.7 g/dL (ref 30.0–36.0)
MCV: 93.8 fL (ref 80.0–100.0)
Platelets: 159 10*3/uL (ref 150–400)
RBC: 4.18 MIL/uL — ABNORMAL LOW (ref 4.22–5.81)
RDW: 12.8 % (ref 11.5–15.5)
WBC: 7 10*3/uL (ref 4.0–10.5)
nRBC: 0 % (ref 0.0–0.2)

## 2022-12-28 LAB — BASIC METABOLIC PANEL
Anion gap: 11 (ref 5–15)
BUN: 21 mg/dL (ref 8–23)
CO2: 24 mmol/L (ref 22–32)
Calcium: 10.4 mg/dL — ABNORMAL HIGH (ref 8.9–10.3)
Chloride: 102 mmol/L (ref 98–111)
Creatinine, Ser: 1.38 mg/dL — ABNORMAL HIGH (ref 0.61–1.24)
GFR, Estimated: 50 mL/min — ABNORMAL LOW (ref >60)
Glucose, Bld: 149 mg/dL — ABNORMAL HIGH (ref 70–99)
Potassium: 4.1 mmol/L (ref 3.5–5.1)
Sodium: 137 mmol/L (ref 135–145)

## 2022-12-28 MED ORDER — HEPARIN BOLUS VIA INFUSION
4000.0000 [IU] | Freq: Once | INTRAVENOUS | Status: AC
  Administered 2022-12-28: 4000 [IU] via INTRAVENOUS

## 2022-12-28 MED ORDER — ASPIRIN 81 MG PO CHEW
324.0000 mg | CHEWABLE_TABLET | Freq: Once | ORAL | Status: AC
  Administered 2022-12-28: 324 mg via ORAL
  Filled 2022-12-28: qty 4

## 2022-12-28 MED ORDER — NITROGLYCERIN IN D5W 200-5 MCG/ML-% IV SOLN
0.0000 ug/min | INTRAVENOUS | Status: DC
  Administered 2022-12-28: 5 ug/min via INTRAVENOUS
  Filled 2022-12-28: qty 250

## 2022-12-28 MED ORDER — HEPARIN (PORCINE) 25000 UT/250ML-% IV SOLN
1100.0000 [IU]/h | INTRAVENOUS | Status: DC
  Administered 2022-12-28: 1000 [IU]/h via INTRAVENOUS
  Filled 2022-12-28: qty 250

## 2022-12-28 MED ORDER — NITROGLYCERIN 2 % TD OINT
1.0000 [in_us] | TOPICAL_OINTMENT | Freq: Four times a day (QID) | TRANSDERMAL | Status: DC
  Administered 2022-12-28: 1 [in_us] via TOPICAL
  Filled 2022-12-28 (×3): qty 1

## 2022-12-28 NOTE — ED Triage Notes (Signed)
Patient here POV from Home.  Endorses CP to Mid Chest that began 40 Minutes ago. No SOB.   Recently and Discharged from the Hospital Cardiac Catheterization.   NAD Noted during Triage. A&Ox4. GCS 15. Ambulatory.

## 2022-12-28 NOTE — ED Provider Notes (Signed)
[facility] EMERGENCY DEPT Provider Note   CSN: [id number] Arrival date & time: 12/28/22  1609     History  Chief Complaint  Patient presents with   Chest Pain    Gregory [name] is a 89 y.o. male.   Patient has a history of prior stroke, unstable angina, non-ST elevation MI, hypertension.  Patient was recently admitted to the hospital on January 5.  He was discharged on January 8.  He was diagnosed with non-ST elevation myocardial infarction.  Patient underwent a cardiac catheterization on January 8.  He was noted to have a 50% distal left circumflex lesion and a 50% proximal mid LAD lesion.  Medical therapy was recommended.  Patient states he was out walking about active today.  He started having similar chest pain that brought him to the hospital the other day.  The symptoms lasted for about 45 minutes and now have resolved.  He denies any fevers or chills.  No shortness of breath.    Home Medications Prior to Admission medications   Medication Sig Start Date End Date Taking? Authorizing Provider  ALPRAZolam Duanne Moron) 0.5 MG tablet Take 0.5-1 mg by mouth See admin instructions. Take 0.5 mg in the morning and 1 mg at bedtime 03/17/17   [provider]  amLODipine (NORVASC) 5 MG tablet Take 5 mg by mouth daily.    [provider]  aspirin 81 MG tablet Take 81 mg by mouth daily.    [provider]  atorvastatin (LIPITOR) 80 MG tablet Take 1 tablet (80 mg total) by mouth daily. 12/27/22   Almyra Deforest, PA  Calcium Carbonate Antacid (ALKA-SELTZER ANTACID PO) Take 2 tablets by mouth as needed (heartburn).    [provider]  Calcium Carbonate Antacid (TUMS PO) Take 2 tablets by mouth as needed (heartburn).    [provider]  clopidogrel (PLAVIX) 75 MG tablet Take 75 mg by mouth daily.    [provider]  ibuprofen (ADVIL) 200 MG tablet Take 400 mg by mouth as needed for moderate pain.    [provider]  mirtazapine  (REMERON) 7.5 MG tablet TAKE 1 TABLET(7.5 MG) BY MOUTH AT BEDTIME Patient taking differently: Take 7.5 mg by mouth at bedtime. 12/15/22   Cottle, Billey Co., MD  Multiple Vitamins-Minerals (CENTRUM ADULTS PO) Take 1 tablet by mouth daily.     [provider]  Omega-3 Fatty Acids (FISH OIL PO) Take 1 capsule by mouth daily.    [provider]  sertraline (ZOLOFT) 50 MG tablet TAKE 3 TABLETS(150 MG) BY MOUTH DAILY Patient taking differently: Take 150 mg by mouth daily. 12/15/22   Cottle, Billey Co., MD      Allergies    Bupropion and Valproic acid    Review of Systems   Review of Systems  Cardiovascular:  Positive for chest pain.    Physical Exam Updated Vital Signs BP 136/86   Pulse 64   Temp 97.8 F (36.6 C) (Temporal)   Resp 10   Ht 1.829 m (6')   Wt 83.9 kg   SpO2 99%   BMI 25.09 kg/m  Physical Exam Vitals and nursing note reviewed.  Constitutional:      General: He is not in acute distress.    Appearance: He is well-developed.  HENT:     Head: Normocephalic and atraumatic.     Right Ear: External ear normal.     Left Ear: External ear normal.  Eyes:     General: No  scleral icterus.       Right eye: No discharge.        Left eye: No discharge.     Conjunctiva/sclera: Conjunctivae normal.  Neck:     Trachea: No tracheal deviation.  Cardiovascular:     Rate and Rhythm: Normal rate and regular rhythm.  Pulmonary:     Effort: Pulmonary effort is normal. No respiratory distress.     Breath sounds: Normal breath sounds. No stridor. No wheezing or rales.  Abdominal:     General: Bowel sounds are normal. There is no distension.     Palpations: Abdomen is soft.     Tenderness: There is no abdominal tenderness. There is no guarding or rebound.  Musculoskeletal:        General: No tenderness or deformity.     Cervical back: Neck supple.  Skin:    General: Skin is warm and dry.     Findings: No rash.  Neurological:     General: No focal deficit  present.     Mental Status: He is alert.     Cranial Nerves: No cranial nerve deficit, dysarthria or facial asymmetry.     Sensory: No sensory deficit.     Motor: No abnormal muscle tone or seizure activity.     Coordination: Coordination normal.  Psychiatric:        Mood and Affect: Mood normal.     ED Results / Procedures / Treatments   Labs (all labs ordered are listed, but only abnormal results are displayed) Labs Reviewed  BASIC METABOLIC PANEL - Abnormal; Notable for the following components:      Result Value   Glucose, Bld 149 (*)    Creatinine, Ser 1.38 (*)    Calcium 10.4 (*)    GFR, Estimated 50 (*)    All other components within normal limits  CBC - Abnormal; Notable for the following components:   RBC 4.18 (*)    All other components within normal limits  TROPONIN I (HIGH SENSITIVITY) - Abnormal; Notable for the following components:   Troponin I (High Sensitivity) 820 (*)    All other components within normal limits  TROPONIN I (HIGH SENSITIVITY)    EKG None  Radiology DG Chest 2 View  Result Date: 12/28/2022 CLINICAL DATA:  Chest pain EXAM: CHEST - 2 VIEW COMPARISON:  Chest x-ray 12/23/2022.  CT chest 12/23/2022. FINDINGS: The heart size and mediastinal contours are within normal limits. Right upper lobe nodular density is stable. The lungs are otherwise clear. There is no pleural effusion or pneumothorax. The visualized skeletal structures are unremarkable. IMPRESSION: 1. No active cardiopulmonary disease. 2. Stable right upper lobe nodular density. Electronically Signed   By: Ronney Asters M.D.   On: 12/28/2022 17:02    Procedures .Critical Care  Performed by: Dorie Rank, MD Authorized by: Dorie Rank, MD   Critical care provider statement:    Critical care time (minutes):  45   Critical care was time spent personally by me on the following activities:  Development of treatment plan with patient or surrogate, discussions with consultants, evaluation of  patient's response to treatment, examination of patient, ordering and review of laboratory studies, ordering and review of radiographic studies, ordering and performing treatments and interventions, pulse oximetry, re-evaluation of patient's condition and review of old charts     Medications Ordered in ED Medications  nitroGLYCERIN 50 mg in dextrose 5 % 250 mL (0.2 mg/mL) infusion (30 mcg/min Intravenous Rate/Dose Change 12/28/22 1812)  heparin ADULT  infusion 100 units/mL (25000 units/234mL) (1,000 Units/hr Intravenous New Bag/Given 12/28/22 1745)  aspirin chewable tablet 324 mg (324 mg Oral Given 12/28/22 1656)  heparin bolus via infusion 4,000 Units (4,000 Units Intravenous Bolus from Bag 12/28/22 1745)    ED Course/ Medical Decision Making/ A&P Clinical Course as of 12/28/22 1820  Wed Dec 28, 2022  1718 CBC(!) CBC is normal. [JK]  1718 Basic metabolic panel(!) Metabolic panel shows elevated creatinine 1.38 [JK]  1718 Troponin I (High Sensitivity)(!!) Troponin elevated compared to previous values [JK]  1719 Will start patient on a heparin infusion. [JK]  1719   He states he remains pain-free.  I will consult with cardiology [JK]  1723 Patient states he started having pain again about 5 minutes ago.  Repeat EKG does show elevation in aVR and V1, ST depression inferior laterally [JK]  1752 Reviewed EKG with the [place].  He does not feel that EKG meets STEMI criteria.  Will continue treatment as NSTEMI.  I will consult with cardiology [JK]  1812 Case discussed with Dr. Excell Seltzer.  He agrees with admission to [facility] under his service. [JK]    Clinical Course User Index [JK] Linwood Dibbles, MD                           Medical Decision Making Differential diagnosis includes but not limited to NSTEMI, STEMI, pneumonia, pneumothorax  Amount and/or Complexity of Data Reviewed Labs: ordered. Decision-making details documented in ED Course. Radiology: ordered and independent  interpretation performed. Discussion of management or test interpretation with external provider(s): Case discussed with Dr. [place] Dr. Excell Seltzer  Risk OTC drugs. Prescription drug management. Drug therapy requiring intensive monitoring for toxicity. Decision regarding hospitalization.   Patient presented to ED for evaluation of chest pain.  Patient recently in the hospital diagnosed with an NSTEMI.  Patient developed a recurrent pain today.  Records reviewed from recent hospitalization.  Patient had a cardiac catheterization which showed nonobstructive coronary artery disease.  Patient also had a CT angio chest abdomen pelvis that did not show any evidence of PE or acute aortic disease.  Patient developed pain while he was in the ED again.  Patient started on heparin and nitroglycerin.  EKG abnormal but not consistent with acute ST elevation MI.  Discussed case with Dr. Excell Seltzer and will plan on admission to [facility].  We will continue to monitor the patient in the ED.  May end up requiring ED to ED transfer if we do not have any bed availability this evening.        Final Clinical Impression(s) / ED Diagnoses Final diagnoses:  NSTEMI (non-ST elevated myocardial infarction) [facility])    Rx / DC Orders ED Discharge Orders     None         Linwood Dibbles, MD 12/28/22 1820

## 2022-12-28 NOTE — Progress Notes (Signed)
ANTICOAGULATION CONSULT NOTE - Initial Consult  Pharmacy Consult for heparin  Indication: chest pain/ACS  Allergies  Allergen Reactions   Bupropion Other (See Comments)    Unsure about alleries   Valproic Acid Other (See Comments)    Unsure about this    Patient Measurements: Height: 6' (182.9 cm) Weight: 83.9 kg (184 lb 15.5 oz) IBW/kg (Calculated) : 77.6 HEPARIN DW (KG): 83.9   Vital Signs: Temp: 97.8 F (36.6 C) (01/10 1617) Temp Source: Temporal (01/10 1617) BP: 149/86 (01/10 1732) Pulse Rate: 65 (01/10 1732)  Labs: Recent Labs    12/25/22 1956 12/26/22 0156 12/28/22 1618  HGB  --  12.5* 13.2  HCT  --  37.0* 39.2  PLT  --  165 159  HEPARINUNFRC 0.49 0.47  --   CREATININE  --  1.39* 1.38*  TROPONINIHS  --   --  820*    Estimated Creatinine Clearance: 42.2 mL/min (A) (by C-G formula based on SCr of 1.38 mg/dL (H)).   Medical History: Past Medical History:  Diagnosis Date   Abdominal aneurysm [facility])    Depression    History of kidney stones    Hyperlipidemia    Hypertension    Stroke [facility]) 2004?   TIA    Assessment: Patient presents to ED with chest pain. Recent inpatient discharge on 1/8 with NSTEMI. Pharmacy consulted to dose heparin.   CBC wnl. Scr 1.38 elevated from baseline.   Goal of Therapy:  Heparin level 0.3-0.7 units/ml Monitor platelets by anticoagulation protocol: Yes   Plan:  Give heparin bolus 4000 units IV x1  Start heparin infusion at 1000 units/hr  Will f/u heparin level in 8 hours Monitor daily heparin level and CBC Continue to monitor H&H     Gena Fray, PharmD PGY1 Pharmacy Resident   12/28/2022 6:23 PM

## 2022-12-28 NOTE — ED Notes (Signed)
Pt arrived from [facility] via [facility] for further cardiac workup. Pt has 3 patent IV, on nitro and heparin. AO4, 0/10 CP

## 2022-12-28 NOTE — ED Notes (Signed)
Report via phone given to ED charge nurse at [facility]. Pt and family updated about transfer to ED instead of floor due to bed not being available tonight.

## 2022-12-28 NOTE — ED Notes (Signed)
Pt OTF with [facility]

## 2022-12-29 ENCOUNTER — Other Ambulatory Visit: Payer: Self-pay

## 2022-12-29 ENCOUNTER — Encounter (HOSPITAL_COMMUNITY)
Admission: EM | Disposition: A | Discharge status: Home / Self Care | Payer: Self-pay | Source: Home / Self Care | Attending: Cardiovascular Disease

## 2022-12-29 DIAGNOSIS — Z7902 Long term (current) use of antithrombotics/antiplatelets: Secondary | ICD-10-CM | POA: Diagnosis not present

## 2022-12-29 DIAGNOSIS — Z8249 Family history of ischemic heart disease and other diseases of the circulatory system: Secondary | ICD-10-CM | POA: Diagnosis not present

## 2022-12-29 DIAGNOSIS — Z7982 Long term (current) use of aspirin: Secondary | ICD-10-CM | POA: Diagnosis not present

## 2022-12-29 DIAGNOSIS — Z87442 Personal history of urinary calculi: Secondary | ICD-10-CM | POA: Diagnosis not present

## 2022-12-29 DIAGNOSIS — I2510 Atherosclerotic heart disease of native coronary artery without angina pectoris: Secondary | ICD-10-CM

## 2022-12-29 DIAGNOSIS — R911 Solitary pulmonary nodule: Secondary | ICD-10-CM | POA: Diagnosis present

## 2022-12-29 DIAGNOSIS — Z79899 Other long term (current) drug therapy: Secondary | ICD-10-CM | POA: Diagnosis not present

## 2022-12-29 DIAGNOSIS — Z888 Allergy status to other drugs, medicaments and biological substances status: Secondary | ICD-10-CM | POA: Diagnosis not present

## 2022-12-29 DIAGNOSIS — I2584 Coronary atherosclerosis due to calcified coronary lesion: Secondary | ICD-10-CM | POA: Diagnosis present

## 2022-12-29 DIAGNOSIS — R739 Hyperglycemia, unspecified: Secondary | ICD-10-CM | POA: Diagnosis not present

## 2022-12-29 DIAGNOSIS — I214 Non-ST elevation (NSTEMI) myocardial infarction: Secondary | ICD-10-CM | POA: Diagnosis not present

## 2022-12-29 DIAGNOSIS — F32A Depression, unspecified: Secondary | ICD-10-CM | POA: Diagnosis not present

## 2022-12-29 DIAGNOSIS — N1831 Chronic kidney disease, stage 3a: Secondary | ICD-10-CM | POA: Diagnosis not present

## 2022-12-29 DIAGNOSIS — I714 Abdominal aortic aneurysm, without rupture, unspecified: Secondary | ICD-10-CM | POA: Diagnosis not present

## 2022-12-29 DIAGNOSIS — I129 Hypertensive chronic kidney disease with stage 1 through stage 4 chronic kidney disease, or unspecified chronic kidney disease: Secondary | ICD-10-CM | POA: Diagnosis not present

## 2022-12-29 DIAGNOSIS — I440 Atrioventricular block, first degree: Secondary | ICD-10-CM | POA: Diagnosis not present

## 2022-12-29 DIAGNOSIS — E785 Hyperlipidemia, unspecified: Secondary | ICD-10-CM | POA: Diagnosis not present

## 2022-12-29 DIAGNOSIS — Z8673 Personal history of transient ischemic attack (TIA), and cerebral infarction without residual deficits: Secondary | ICD-10-CM | POA: Diagnosis not present

## 2022-12-29 DIAGNOSIS — I25118 Atherosclerotic heart disease of native coronary artery with other forms of angina pectoris: Secondary | ICD-10-CM | POA: Diagnosis not present

## 2022-12-29 HISTORY — PX: CORONARY ANGIOGRAPHY: CATH118303

## 2022-12-29 LAB — BASIC METABOLIC PANEL
Anion gap: 10 (ref 5–15)
BUN: 19 mg/dL (ref 8–23)
CO2: 23 mmol/L (ref 22–32)
Calcium: 9 mg/dL (ref 8.9–10.3)
Chloride: 104 mmol/L (ref 98–111)
Creatinine, Ser: 1.25 mg/dL — ABNORMAL HIGH (ref 0.61–1.24)
GFR, Estimated: 56 mL/min — ABNORMAL LOW (ref >60)
Glucose, Bld: 114 mg/dL — ABNORMAL HIGH (ref 70–99)
Potassium: 3.9 mmol/L (ref 3.5–5.1)
Sodium: 137 mmol/L (ref 135–145)

## 2022-12-29 LAB — HEPARIN LEVEL (UNFRACTIONATED)
Heparin Unfractionated: 0.21 IU/mL — ABNORMAL LOW (ref 0.30–0.70)
Heparin Unfractionated: 0.35 IU/mL (ref 0.30–0.70)

## 2022-12-29 SURGERY — CORONARY ANGIOGRAPHY (CATH LAB)
Anesthesia: LOCAL

## 2022-12-29 MED ORDER — ALPRAZOLAM 0.5 MG PO TABS
0.5000 mg | ORAL_TABLET | Freq: Every day | ORAL | Status: DC
  Filled 2022-12-29: qty 1
  Filled 2022-12-29: qty 2

## 2022-12-29 MED ORDER — HEPARIN SODIUM (PORCINE) 1000 UNIT/ML IJ SOLN
INTRAMUSCULAR | Status: DC | PRN
  Administered 2022-12-29: 4000 [IU] via INTRAVENOUS

## 2022-12-29 MED ORDER — HEPARIN (PORCINE) 25000 UT/250ML-% IV SOLN
1200.0000 [IU]/h | INTRAVENOUS | Status: DC
  Administered 2022-12-29: 1100 [IU]/h via INTRAVENOUS
  Filled 2022-12-29 (×2): qty 250

## 2022-12-29 MED ORDER — ASPIRIN 81 MG PO TBEC
81.0000 mg | DELAYED_RELEASE_TABLET | Freq: Every day | ORAL | Status: DC
  Administered 2022-12-29 – 2022-12-30 (×2): 81 mg via ORAL
  Filled 2022-12-29 (×2): qty 1

## 2022-12-29 MED ORDER — IOHEXOL 350 MG/ML SOLN
INTRAVENOUS | Status: DC | PRN
  Administered 2022-12-29: 80 mL

## 2022-12-29 MED ORDER — VERAPAMIL HCL 2.5 MG/ML IV SOLN
INTRA_ARTERIAL | Status: DC | PRN
  Administered 2022-12-29: 10 mL via INTRA_ARTERIAL

## 2022-12-29 MED ORDER — SODIUM CHLORIDE 0.9% FLUSH
3.0000 mL | Freq: Two times a day (BID) | INTRAVENOUS | Status: DC
  Administered 2022-12-29: 3 mL via INTRAVENOUS

## 2022-12-29 MED ORDER — ACETAMINOPHEN 325 MG PO TABS: 650.0000 mg | ORAL_TABLET | ORAL | Status: DC | PRN

## 2022-12-29 MED ORDER — LABETALOL HCL 5 MG/ML IV SOLN: 10.0000 mg | INTRAVENOUS | Status: AC | PRN

## 2022-12-29 MED ORDER — ASPIRIN 81 MG PO TABS: 81.0000 mg | ORAL_TABLET | Freq: Every day | ORAL | Status: DC

## 2022-12-29 MED ORDER — ATORVASTATIN CALCIUM 80 MG PO TABS
80.0000 mg | ORAL_TABLET | Freq: Every day | ORAL | Status: DC
  Administered 2022-12-29 – 2022-12-30 (×2): 80 mg via ORAL
  Filled 2022-12-29: qty 1
  Filled 2022-12-29: qty 2

## 2022-12-29 MED ORDER — HEPARIN (PORCINE) IN NACL 1000-0.9 UT/500ML-% IV SOLN
INTRAVENOUS | Status: AC
  Filled 2022-12-29: qty 500

## 2022-12-29 MED ORDER — SODIUM CHLORIDE 0.9% FLUSH
3.0000 mL | INTRAVENOUS | Status: DC | PRN
  Administered 2022-12-29: 3 mL via INTRAVENOUS

## 2022-12-29 MED ORDER — SODIUM CHLORIDE 0.9 % IV SOLN: 250.0000 mL | INTRAVENOUS | Status: DC | PRN

## 2022-12-29 MED ORDER — OMEGA-3-ACID ETHYL ESTERS 1 G PO CAPS
1.0000 g | ORAL_CAPSULE | Freq: Every day | ORAL | Status: DC
  Administered 2022-12-29 – 2022-12-30 (×2): 1 g via ORAL
  Filled 2022-12-29 (×2): qty 1

## 2022-12-29 MED ORDER — SODIUM CHLORIDE 0.9 % WEIGHT BASED INFUSION: 1.0000 mL/kg/h | INTRAVENOUS | Status: DC

## 2022-12-29 MED ORDER — MIRTAZAPINE 15 MG PO TABS
7.5000 mg | ORAL_TABLET | Freq: Every day | ORAL | Status: DC
  Administered 2022-12-29 (×2): 7.5 mg via ORAL
  Filled 2022-12-29 (×3): qty 1

## 2022-12-29 MED ORDER — ALPRAZOLAM 0.5 MG PO TABS
1.0000 mg | ORAL_TABLET | Freq: Every day | ORAL | Status: DC
  Administered 2022-12-29 (×2): 1 mg via ORAL
  Filled 2022-12-29: qty 4
  Filled 2022-12-29: qty 2

## 2022-12-29 MED ORDER — SERTRALINE HCL 50 MG PO TABS
150.0000 mg | ORAL_TABLET | Freq: Every day | ORAL | Status: DC
  Administered 2022-12-29 – 2022-12-30 (×2): 150 mg via ORAL
  Filled 2022-12-29 (×2): qty 3

## 2022-12-29 MED ORDER — LIDOCAINE HCL (PF) 1 % IJ SOLN
INTRAMUSCULAR | Status: AC
  Filled 2022-12-29: qty 30

## 2022-12-29 MED ORDER — ALPRAZOLAM 0.25 MG PO TABS: 0.5000 mg | ORAL_TABLET | ORAL | Status: DC

## 2022-12-29 MED ORDER — VERAPAMIL HCL 2.5 MG/ML IV SOLN
INTRAVENOUS | Status: AC
  Filled 2022-12-29: qty 2

## 2022-12-29 MED ORDER — HYDRALAZINE HCL 20 MG/ML IJ SOLN: 10.0000 mg | INTRAMUSCULAR | Status: AC | PRN

## 2022-12-29 MED ORDER — AMLODIPINE BESYLATE 10 MG PO TABS
10.0000 mg | ORAL_TABLET | Freq: Every day | ORAL | Status: DC
  Administered 2022-12-30: 10 mg via ORAL
  Filled 2022-12-29: qty 2
  Filled 2022-12-29: qty 1

## 2022-12-29 MED ORDER — HEPARIN (PORCINE) IN NACL 1000-0.9 UT/500ML-% IV SOLN
INTRAVENOUS | Status: DC | PRN
  Administered 2022-12-29 (×2): 500 mL

## 2022-12-29 MED ORDER — SODIUM CHLORIDE 0.9% FLUSH: 3.0000 mL | Freq: Two times a day (BID) | INTRAVENOUS | Status: DC

## 2022-12-29 MED ORDER — MORPHINE SULFATE (PF) 2 MG/ML IV SOLN: 2.0000 mg | INTRAVENOUS | Status: DC | PRN

## 2022-12-29 MED ORDER — ASPIRIN 81 MG PO CHEW: 81.0000 mg | CHEWABLE_TABLET | Freq: Every day | ORAL | Status: DC

## 2022-12-29 MED ORDER — SODIUM CHLORIDE 0.9% FLUSH: 3.0000 mL | INTRAVENOUS | Status: DC | PRN

## 2022-12-29 MED ORDER — CLOPIDOGREL BISULFATE 75 MG PO TABS
75.0000 mg | ORAL_TABLET | Freq: Every day | ORAL | Status: DC
  Administered 2022-12-29 – 2022-12-30 (×2): 75 mg via ORAL
  Filled 2022-12-29 (×2): qty 1

## 2022-12-29 MED ORDER — CLOPIDOGREL BISULFATE 75 MG PO TABS: 75.0000 mg | ORAL_TABLET | Freq: Every day | ORAL | Status: DC

## 2022-12-29 MED ORDER — HEPARIN BOLUS VIA INFUSION
2000.0000 [IU] | Freq: Once | INTRAVENOUS | Status: AC
  Administered 2022-12-29: 2000 [IU] via INTRAVENOUS
  Filled 2022-12-29: qty 2000

## 2022-12-29 MED ORDER — ONDANSETRON HCL 4 MG/2ML IJ SOLN: 4.0000 mg | Freq: Four times a day (QID) | INTRAMUSCULAR | Status: DC | PRN

## 2022-12-29 MED ORDER — SODIUM CHLORIDE 0.9 % IV SOLN: INTRAVENOUS | Status: AC

## 2022-12-29 MED ORDER — HEPARIN SODIUM (PORCINE) 1000 UNIT/ML IJ SOLN
INTRAMUSCULAR | Status: AC
  Filled 2022-12-29: qty 10

## 2022-12-29 MED ORDER — SODIUM CHLORIDE 0.9 % WEIGHT BASED INFUSION: 3.0000 mL/kg/h | INTRAVENOUS | Status: DC

## 2022-12-29 MED ORDER — NITROGLYCERIN 1 MG/10 ML FOR IR/CATH LAB
INTRA_ARTERIAL | Status: AC
  Filled 2022-12-29: qty 10

## 2022-12-29 MED ORDER — ATORVASTATIN CALCIUM 80 MG PO TABS: 80.0000 mg | ORAL_TABLET | Freq: Every day | ORAL | Status: DC

## 2022-12-29 SURGICAL SUPPLY — 11 items
CATH OPTITORQUE TIG 4.0 5F (CATHETERS) IMPLANT
DEVICE RAD COMP TR BAND LRG (VASCULAR PRODUCTS) IMPLANT
GLIDESHEATH SLEND A-KIT 6F 22G (SHEATH) IMPLANT
GUIDEWIRE ANGLED .035X150CM (WIRE) IMPLANT
GUIDEWIRE INQWIRE 1.5J.035X260 (WIRE) IMPLANT
INQWIRE 1.5J .035X260CM (WIRE) ×1
KIT HEART LEFT (KITS) ×1 IMPLANT
PACK CARDIAC CATHETERIZATION (CUSTOM PROCEDURE TRAY) ×1 IMPLANT
TRANSDUCER W/STOPCOCK (MISCELLANEOUS) ×1 IMPLANT
TUBING CIL FLEX 10 FLL-RA (TUBING) ×1 IMPLANT
WIRE HI TORQ VERSACORE-J 145CM (WIRE) IMPLANT

## 2022-12-29 NOTE — H&P (Signed)
Cardiology Admission History and Physical   Patient ID: Gregory [name] MRN: [id number]; DOB: 1934/08/31   Admission date: 12/28/2022  PCP:  [name], MD   [place] [facility] Cardiologist:  Buford Dresser, MD        Chief Complaint:  chest pain  Patient Profile:   Gregory [name] is a 89 y.o. 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  History of Present Illness:   Mr. Harder 89 y.o. 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  He was recently admitted for NSTEMI and discharged on 1/8.  Workup at that time was notable for CTA negative for PE but did show considerable coronary calcifications and stable infrarenal AAA.  Echo was obtained that demonstrated EF 50 to 55%, mild hypokinesis of the anterior and anterolateral wall, normal RV, trivial MR, mildly dilated aortic root measuring 42 mm. Patient ultimately underwent cardiac catheterization on 12/26/2022 which revealed 50% distal left circumflex lesion, 50% proximal to mid LAD lesion. Medical therapy was recommended. LVEDP was 19. He was already on dual antiplatelet therapy for prior carotid stenting.    Postop day 1 was uncomplicated but today had a reoccurrence of substernal burning chest pain with radiation to arms for which she [name] presented to [facility].  That his chest pain was brought on by exertion but he had been up walking around and doing all his chores previously without symptoms of angina, shortness of breath, heartburn.  He did not eat a recent meal or had any acidic food.  Denied palpitations or syncope.  EKG at [place] ED notable for lateral ST depressions and T wave  inversions, isolated ST elevation in aVR, sinus rhythm with first-degree AV block.  Troponin was 820-669.  He was reloaded with aspirin and started on heparin, nitro gtt. and accepted for transfer to [facility].  On arrival he was asymptomatic without any chest pain on 30 mcg of nitro and hemodynamically stable.   Past Medical History:  Diagnosis Date   Abdominal aneurysm [facility])    Depression    History of kidney stones    Hyperlipidemia    Hypertension    Stroke [facility]) 2004?   TIA    Past Surgical History:  Procedure Laterality Date   COLON SURGERY     polyps removed   COLONOSCOPY     EYE SURGERY     cataract right   IR ANGIO INTRA EXTRACRAN SEL COM CAROTID INNOMINATE BILAT MOD SED  02/28/2018   IR ANGIO VERTEBRAL SEL SUBCLAVIAN INNOMINATE UNI R MOD SED  02/28/2018   IR RADIOLOGIST EVAL & MGMT  02/12/2021   LEFT HEART CATH AND CORONARY ANGIOGRAPHY N/A 12/26/2022   Procedure: LEFT HEART CATH AND CORONARY ANGIOGRAPHY;  Surgeon: Lorretta Harp, MD;  Location: [facility];  Service: Cardiovascular;  Laterality: N/A;   OTHER SURGICAL HISTORY     Carotid    RADIOLOGY WITH ANESTHESIA N/A 02/28/2018   Procedure: STENTING;  Surgeon: Luanne Bras, MD;  Location: [place];  Service: Radiology;  Laterality: N/A;     Medications Prior to Admission: Prior to Admission medications   Medication Sig Start Date End Date Taking? Authorizing  Provider  atorvastatin (LIPITOR) 80 MG tablet Take 1 tablet (80 mg total) by mouth daily. 12/27/22  Yes Meng, Isaac Laud, PA  ALPRAZolam Duanne Moron) 0.5 MG tablet Take 0.5-1 mg by mouth See admin instructions. Take 0.5 mg in the morning and 1 mg at bedtime 03/17/17   [provider]  amLODipine (NORVASC) 5 MG tablet Take 5 mg by mouth daily.    [provider]  aspirin 81 MG tablet Take 81 mg by mouth daily.    [provider]  Calcium Carbonate Antacid (ALKA-SELTZER ANTACID PO) Take 2 tablets by mouth as needed (heartburn).    [provider]  Calcium Carbonate Antacid (TUMS PO) Take 2 tablets by mouth as needed (heartburn).    [provider]  clopidogrel (PLAVIX) 75 MG tablet Take 75 mg by mouth daily.    [provider]  ibuprofen (ADVIL) 200 MG tablet Take 400 mg by mouth as needed for moderate pain.    [provider]  mirtazapine (REMERON) 7.5 MG tablet TAKE 1 TABLET(7.5 MG) BY MOUTH AT BEDTIME Patient taking differently: Take 7.5 mg by mouth at bedtime. 12/15/22   Cottle, Billey Co., MD  Multiple Vitamins-Minerals (CENTRUM ADULTS PO) Take 1 tablet by mouth daily.     [provider]  Omega-3 Fatty Acids (FISH OIL PO) Take 1 capsule by mouth daily.    [provider]  sertraline (ZOLOFT) 50 MG tablet TAKE 3 TABLETS(150 MG) BY MOUTH DAILY Patient taking differently: Take 150 mg by mouth daily. 12/15/22   Cottle, Billey Co., MD     Allergies:    Allergies  Allergen Reactions   Bupropion Other (See Comments)    Unsure about alleries   Valproic Acid Other (See Comments)    Unsure about this    Social History:   Social History   Socioeconomic History   Marital status: Married    Spouse name: Not on file   Number of children: 2   Years of education: Not on file   Highest education level: Some college, no degree  Occupational History   Not on file  Tobacco Use   Smoking status: Former   Smokeless tobacco: Never  Scientific laboratory technician Use: Never used  Substance and Sexual Activity   Alcohol use: No   Drug use: No   Sexual activity: Not on file  Other Topics Concern   Not on file  Social History Narrative   Lives at home w/ his wife   Right-handed   Caffeine: occasional coffee and tea, decaf tea at home   Social Determinants of Health   Financial Resource Strain: Not on file  Food Insecurity: Not on file  Transportation Needs: Not on file  Physical Activity: Not on file  Stress: Not on file  Social Connections: Not on file  Intimate Partner  Violence: Not on file    Family History:   The patient's family history includes Cancer in his mother; Heart disease in his brother and mother. There is no history of Tremor.    ROS:  Please see the history of present illness.  All other ROS reviewed and negative.     Physical Exam/Data:   Vitals:   12/29/22 0000 12/29/22 0015 12/29/22 0030 12/29/22 0045  BP: 106/67 117/66 119/70 104/69  Pulse: 65 65 63 68  Resp: 15 15 11 14   Temp:      TempSrc:      SpO2: 92% 93% 93% 94%  Weight:  Height:        Intake/Output Summary (Last 24 hours) at 12/29/2022 0123 Last data filed at 12/28/2022 1812 Gross per 24 hour  Intake 2.48 ml  Output --  Net 2.48 ml      12/28/2022    4:15 PM 12/26/2022    5:28 AM 12/25/2022    5:09 AM  Last 3 Weights  Weight (lbs) 184 lb 15.5 oz 185 lb 187 lb 14.4 oz  Weight (kg) 83.9 kg 83.915 kg 85.231 kg     Body mass index is 25.09 kg/m.  General:  Well nourished, well developed, in no acute distress HEENT: normal Neck: no JVD Vascular: No carotid bruits; Distal pulses 2+ bilaterally   Cardiac:  normal S1, S2; RRR; 3/6 systolic murmur throughout Lungs:  clear to auscultation bilaterally, no wheezing, rhonchi or rales  Abd: soft, nontender, no hepatomegaly  Ext: no edema Musculoskeletal:  No deformities, BUE and BLE strength normal and equal Skin: warm and dry  Neuro:  CNs 2-12 intact, no focal abnormalities noted Psych:  Normal affect    EKG:  The ECG that was done  was personally reviewed and demonstrates: Sinus rhythm with heart rate 61, first-degree AV block, lateral ST depression and T wave inversions with isolated ST elevation in aVR  Relevant CV Studies:  Echo 12/24/2022  1. Left ventricular ejection fraction, by estimation, is 50 to 55%. The  left ventricle has low normal function. The left ventricle demonstrates  regional wall motion abnormalities (see scoring diagram/findings for  description). There is mild concentric  left  ventricular hypertrophy. Left ventricular diastolic parameters are  indeterminate. There is mild hypokinesis of the left ventricular, entire  anterolateral wall and anterior wall.   2. Right ventricular systolic function is normal. The right ventricular  size is normal. There is normal pulmonary artery systolic pressure.   3. The mitral valve is grossly normal. Trivial mitral valve  regurgitation. No evidence of mitral stenosis.   4. The aortic valve is calcified. There is moderate calcification of the  aortic valve. There is moderate thickening of the aortic valve. Aortic  valve regurgitation is not visualized. Aortic valve  sclerosis/calcification is present, without any evidence  of aortic stenosis.   5. Aortic dilatation noted. There is mild dilatation of the aortic root,  measuring 42 mm.   6. The inferior vena cava is normal in size with greater than 50%  respiratory variability, suggesting right atrial pressure of 3 mmHg.   Comparison(s): No prior Echocardiogram.   Conclusion(s)/Recommendation(s): Low normal LVEF. With echo contrast,  parasternal wall motion appears largely normal, but on apical images there  may be mild hypokinesis of the anterior and anterolateral wall  LHC 12/26/22  IMPRESSION: Mr. Fessel had a non-STEMI with troponins went up to the mid 300 range.  His EKG shows nonspecific ST and T wave changes.  His 2D echo does show wall motion abnormality with preserved EF.  CTA of his chest showed severe diffuse coronary calcification.  He was on DAPT for prior carotid stenting.  His coronary anatomy showed no significant obstructive disease surprisingly given the degree of calcification.  His LVEDP was 19.  Medical therapy will be recommended.  The sheath was removed and a TR band was placed on the right wrist to achieve patent hemostasis.  The patient left lab in stable condition.  Dr. Olga Millers, the patient's attending cardiologist, was notified of these results    Laboratory Data:  High Sensitivity Troponin:   Recent Labs  Lab 12/23/22 1849 12/24/22 1313 12/24/22 1508 12/28/22 1618 12/28/22 1930  TROPONINIHS 348* 155* 191* 820* 669*      Chemistry Recent Labs  Lab 12/26/22 0156 12/28/22 1618  NA 136 137  K 3.9 4.1  CL 104 102  CO2 22 24  GLUCOSE 106* 149*  BUN 22 21  CREATININE 1.39* 1.38*  CALCIUM 9.1 10.4*  GFRNONAA 49* 50*  ANIONGAP 10 11    No results for input(s): "PROT", "ALBUMIN", "AST", "ALT", "ALKPHOS", "BILITOT" in the last 168 hours. Lipids  Recent Labs  Lab 12/25/22 0123  CHOL 120  TRIG 102  HDL 37*  LDLCALC 63  CHOLHDL 3.2   Hematology Recent Labs  Lab 12/26/22 0156 12/28/22 1618  WBC 7.2 7.0  RBC 4.00* 4.18*  HGB 12.5* 13.2  HCT 37.0* 39.2  MCV 92.5 93.8  MCH 31.3 31.6  MCHC 33.8 33.7  RDW 12.3 12.8  PLT 165 159   Thyroid No results for input(s): "TSH", "FREET4" in the last 168 hours. BNP Recent Labs  Lab 12/24/22 1313  BNP 326.2*    DDimer  Recent Labs  Lab 12/23/22 1636  DDIMER 1.13*     Radiology/Studies:  DG Chest 2 View  Result Date: 12/28/2022 CLINICAL DATA:  Chest pain EXAM: CHEST - 2 VIEW COMPARISON:  Chest x-ray 12/23/2022.  CT chest 12/23/2022. FINDINGS: The heart size and mediastinal contours are within normal limits. Right upper lobe nodular density is stable. The lungs are otherwise clear. There is no pleural effusion or pneumothorax. The visualized skeletal structures are unremarkable. IMPRESSION: 1. No active cardiopulmonary disease. 2. Stable right upper lobe nodular density. Electronically Signed   By: Ronney Asters M.D.   On: 12/28/2022 17:02     Assessment and Plan:   Non-ST Elevation MI Hypertension, hyperlipidemia Carotid artery stenosis Patient presented with acute substernal typical chest pain with elevated biomarkers and EKG demonstrating ischemia consistent with NSTEMI.  Given his recent catheterization with mid circumflex and LAD lesions with 50%  obstruction, curious that this would be a type I events so shortly after.  Reviewed films and possible that his left circumflex lesion is flow limiting however he is not having consistent anginal symptoms.  Coronary vasospasm versus myocarditis considered.  He has had no preceding viral illness nor does his pain sound like pericarditis.  Big decision point is more aggressive medical management with medications including beta-blocker, long-acting nitrates versus catheterization and consideration of IFR. -Continued aspirin, Plavix, heparin -Continue atorvastatin 80 mg -Continue nitro gtt. -N.p.o. for possible cath -Would start beta-blocker and possibly long-acting nitrate  4.  Lung nodule -CT follow-up in 3 months 5.  AAA-done 1 year   Risk Assessment/Risk Scores:    TIMI Risk Score for Unstable Angina or Non-ST Elevation MI:   The patient's TIMI risk score is 4, which indicates a 20% risk of all cause mortality, new or recurrent myocardial infarction or need for urgent revascularization in the next 14 days.       Severity of Illness: The appropriate patient status for this patient is INPATIENT. Inpatient status is judged to be reasonable and necessary in order to provide the required intensity of service to ensure the patient's safety. The patient's presenting symptoms, physical exam findings, and initial radiographic and laboratory data in the context of their chronic comorbidities is felt to place them at high risk for further clinical deterioration. Furthermore, it is not anticipated that the patient will be medically stable for discharge from the hospital within 2 midnights  of admission.   * I certify that at the point of admission it is my clinical judgment that the patient will require inpatient hospital care spanning beyond 2 midnights from the point of admission due to high intensity of service, high risk for further deterioration and high frequency of surveillance required.*   For  questions or updates, please contact [place] Please consult [email or web address] for contact info under     Signed, Silas Flood, MD  12/29/2022 1:23 AM

## 2022-12-29 NOTE — ED Notes (Signed)
Pt's O2 87% while sleeping. Pt placed on 2L [place] for comfort, pt's O2 now 94%.

## 2022-12-29 NOTE — Progress Notes (Signed)
Pt admitted to the unit from cath lab with right radial TR band for deflation. TR band deflated per order and protocol and now TR band removed and clean, dry sterile dsg applied to site. Right radiol remains level 0, no bleeding, hematoma or bruising noted. To oriented to unit and room, son remains at the bedside, call light within reach and IV intact and transfusing. VSS, pt denies any SOB or chest pain. Will continue to closely monitor. Delia Heady RN   12/29/22 1813  Vitals  Temp 97.7 F (36.5 C)  Temp Source Oral  BP (!) 140/68  MAP (mmHg) 85  BP Location Left Arm  BP Method Automatic  Patient Position (if appropriate) Lying  Pulse Rate 62  Pulse Rate Source Monitor  ECG Heart Rate 63  Resp 16  Level of Consciousness  Level of Consciousness Alert  MEWS COLOR  MEWS Score Color Green  Oxygen Therapy  SpO2 93 %  O2 Device Nasal Cannula  Pain Assessment  Pain Scale 0-10  Pain Score 0  MEWS Score  MEWS Temp 0  MEWS Systolic 0  MEWS Pulse 0  MEWS RR 0  MEWS LOC 0  MEWS Score 0

## 2022-12-29 NOTE — Progress Notes (Signed)
ANTICOAGULATION CONSULT NOTE   Pharmacy Consult for heparin  Indication: chest pain/ACS  Allergies  Allergen Reactions   Bupropion Other (See Comments)    Unsure about alleries   Valproic Acid Other (See Comments)    Unsure about this    Patient Measurements: Height: 6' (182.9 cm) Weight: 83.9 kg (184 lb 15.5 oz) IBW/kg (Calculated) : 77.6 HEPARIN DW (KG): 83.9   Vital Signs: Temp: 97.9 F (36.6 C) (01/11 1215) Temp Source: Oral (01/11 1215) BP: 119/67 (01/11 1409) Pulse Rate: 56 (01/11 1409)  Labs: Recent Labs    12/28/22 1618 12/28/22 1930 12/29/22 0141 12/29/22 1106  HGB 13.2  --   --   --   HCT 39.2  --   --   --   PLT 159  --   --   --   HEPARINUNFRC  --   --  0.21* 0.35  CREATININE 1.38*  --  1.25*  --   TROPONINIHS 820* 669*  --   --      Estimated Creatinine Clearance: 46.6 mL/min (A) (by C-G formula based on SCr of 1.25 mg/dL (H)).   Medical History: Past Medical History:  Diagnosis Date   Abdominal aneurysm [facility])    Depression    History of kidney stones    Hyperlipidemia    Hypertension    Stroke [facility]) 2004?   TIA    Assessment: Patient presents to ED with chest pain. Recent inpatient discharge on 1/8 with NSTEMI. Pharmacy consulted to dose heparin.   Heparin level came back therapeutic prior to cath at 0.35, on heparin infusion at 1100 units/hr. Underwent cath today finding similar anatomy but possible 70-80% lesion at septal perforator just before an aneurysmal segment which is not ideal for PCI. Will restart heparin 4 hours after sheath removal (documented on 1/11@1406 ).  Goal of Therapy:  Heparin level 0.3-0.7 units/ml Monitor platelets by anticoagulation protocol: Yes   Plan:  Restart heparin infusion at 1100 units/hr on 1/11@1800  Monitor daily HL, CBC, and for s/sx of bleeding  Antonietta Jewel, PharmD, [place] Pharmacist  Phone: [phone] 12/29/2022 2:35 PM  Please check AMION for all [place] phone numbers After  10:00 PM, call [place] [phone]

## 2022-12-29 NOTE — H&P (View-Only) (Signed)
Rounding Note    [name] Name: Gregory [name] Date of Encounter: 12/29/2022  [place] Cardiologist: Buford Dresser, MD   Subjective   Laying in bed, comfortable. Sleeping intermittently. Son at Gregory bedside.   Inpatient Medications    Scheduled Meds:  ALPRAZolam  0.5 mg Oral Daily   ALPRAZolam  1 mg Oral QHS   aspirin EC  81 mg Oral Daily   atorvastatin  80 mg Oral Daily   clopidogrel  75 mg Oral Daily   mirtazapine  7.5 mg Oral QHS   omega-3 acid ethyl esters  1 g Oral Daily   sertraline  150 mg Oral Daily   Continuous Infusions:  heparin 1,100 Units/hr (12/29/22 0321)   nitroGLYCERIN 30 mcg/min (12/29/22 0322)   PRN Meds: acetaminophen, ondansetron (ZOFRAN) IV   Vital Signs    Vitals:   12/29/22 0400 12/29/22 0545 12/29/22 0600 12/29/22 0700  BP: (!) 105/36  102/64 98/62  Pulse: 67  (!) 57 (!) 58  Resp:   11 12  Temp:  98.2 F (36.8 C)    TempSrc:  Oral    SpO2:   94% 91%  Weight:      Height:        Intake/Output Summary (Last 24 hours) at 12/29/2022 0759 Last data filed at 12/29/2022 0932 Gross per 24 hour  Intake 84.03 ml  Output 300 ml  Net -215.97 ml      12/28/2022    4:15 PM 12/26/2022    5:28 AM 12/25/2022    5:09 AM  Last 3 Weights  Weight (lbs) 184 lb 15.5 oz 185 lb 187 lb 14.4 oz  Weight (kg) 83.9 kg 83.915 kg 85.231 kg      Telemetry    Sinus Rhythm - Personally Reviewed  ECG    Sinus Rhythm, TWI in  lead I, II, biphasic T wave in lateral leads with slight elevation in aVR (similar to prior tracings) - Personally Reviewed  Physical Exam   GEN: No acute distress.   Neck: No JVD Cardiac: RRR, no murmurs, rubs, or gallops.  Respiratory: Clear to auscultation bilaterally. GI: Soft, nontender, non-distended  MS: No edema; No deformity. Right radial cath site stable. Neuro:  Nonfocal  Psych: Normal affect   Labs    High Sensitivity Troponin:   Recent Labs  Lab 12/23/22 1849 12/24/22 1313 12/24/22 1508  12/28/22 1618 12/28/22 1930  TROPONINIHS 348* 155* 191* 820* 669*     Chemistry Recent Labs  Lab 12/26/22 0156 12/28/22 1618 12/29/22 0141  NA 136 137 137  K 3.9 4.1 3.9  CL 104 102 104  CO2 22 24 23   GLUCOSE 106* 149* 114*  BUN 22 21 19   CREATININE 1.39* 1.38* 1.25*  CALCIUM 9.1 10.4* 9.0  GFRNONAA 49* 50* 56*  ANIONGAP 10 11 10     Lipids  Recent Labs  Lab 12/25/22 0123  CHOL 120  TRIG 102  HDL 37*  LDLCALC 63  CHOLHDL 3.2    Hematology Recent Labs  Lab 12/25/22 0123 12/26/22 0156 12/28/22 1618  WBC 8.1 7.2 7.0  RBC 3.94* 4.00* 4.18*  HGB 12.1* 12.5* 13.2  HCT 36.8* 37.0* 39.2  MCV 93.4 92.5 93.8  MCH 30.7 31.3 31.6  MCHC 32.9 33.8 33.7  RDW 12.3 12.3 12.8  PLT 171 165 159   Thyroid No results for input(s): "TSH", "FREET4" in Gregory last 168 hours.  BNP Recent Labs  Lab 12/24/22 1313  BNP 326.2*    DDimer  Recent Labs  Lab 12/23/22 1636  DDIMER 1.13*     Radiology    DG Chest 2 View  Result Date: 12/28/2022 CLINICAL DATA:  Chest pain EXAM: CHEST - 2 VIEW COMPARISON:  Chest x-ray 12/23/2022.  CT chest 12/23/2022. FINDINGS: Gregory heart size and mediastinal contours are within normal limits. Right upper lobe nodular density is stable. Gregory lungs are otherwise clear. There is no pleural effusion or pneumothorax. Gregory visualized skeletal structures are unremarkable. IMPRESSION: 1. No active cardiopulmonary disease. 2. Stable right upper lobe nodular density. Electronically Signed   By: Ronney Asters M.D.   On: 12/28/2022 17:02    Cardiac Studies   Cath: 12/26/2022  IMPRESSION: Gregory [name] had a non-STEMI with troponins went up to Gregory mid 300 range.  His EKG shows nonspecific ST and T wave changes.  His 2D echo does show wall motion abnormality with preserved EF.  CTA of his chest showed severe diffuse coronary calcification.  He was on DAPT for prior carotid stenting.  His coronary anatomy showed no significant obstructive disease surprisingly given Gregory  degree of calcification.  His LVEDP was 19.  Medical therapy will be recommended.  Gregory sheath was removed and a TR band was placed on Gregory right wrist to achieve patent hemostasis.  Gregory [name] left lab in stable condition.  Dr. Kirk Ruths, Gregory [name]'s attending cardiologist, was notified of these results.   [name]. MD, [facility] 12/26/2022 10:42 AM  Diagnostic Dominance: Right   [name] Profile     89 y.o. 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   Assessment & Plan    Elevated Troponin Chest pain -- hsTn 820>>669, recent admission troponin 155>> 191.  Cardiac catheterization 1/8 with mild nonobstructive disease of 50% mid LAD and 50% distal left circumflex with recommendations for medical therapy.  Presents back with pain similar to what prompted initial admission.  Difficult to interpret troponin levels, suspect could be trending down from recent admission question whether he has coronary vasospasm versus microvascular disease.  EKG was noted to be abnormal T wave inversion in lead I, II T waves in Gregory lateral leads.  -- Will continue IV heparin and nitro, review with MD whether repeat cardiac catheterization would be indicated. -- Further increase Norvasc to 10 mg daily with plan to add Imdur once IV nitroglycerin stopped -- Continue aspirin, Plavix, statin  Hypertension -- Controlled -- Currently on IV nitroglycerin, will increase Norvasc to 10 mg daily  Hyperlipidemia -- Continue atorvastatin daily, Lovaza  History of PVCs -- Previously treated with flecainide, this was stopped in his recent hospitalization given coronary disease  Lung nodule -- Noted on CT during recent admission, recommendations for follow-up CT in 3 months  For questions or updates, please contact [place] Please  consult [email or web address] for contact info under        Signed, Reino Bellis, NP  12/29/2022, 7:59 AM    [name] seen, examined. Available data reviewed. Agree with findings, assessment, and plan as outlined by Reino Bellis, NP.  On my exam, this is an elderly male in no distress.  HEENT is normal, lungs are clear bilaterally, heart is regular rate and rhythm with no murmur gallop with distant heart sounds, abdomen is soft and nontender, right radial pulses 2+, there is no lower extremity edema, skin is warm  and dry with no rash.  High-sensitivity troponin yesterday peaked at 828, followed by a value of 669 3 hours later.  Gregory [name]'s EKG shows sinus rhythm with ST and T wave abnormality consider inferolateral ischemia, but no significant change from previous tracings.  Gregory [name]'s episode of chest discomfort yesterday is concerning.  It started abruptly and is described as central chest pain radiating to both arms.  Symptoms intensified and did not wane until he was started on IV nitroglycerin.  He is now on IV nitroglycerin and heparin and his chest pain-free.  While it would be unusual for him to have any significant change in coronary findings over just a few days, it is possible that he has had a plaque rupture event.  His troponin is more elevated and his symptoms were more severe at Gregory time of his current presentation.  I personally reviewed his cardiac catheterization films with Dr. Gery Pray who did his procedure a few days ago.  I completely agree that he had no high-grade coronary stenosis, but he did have moderate diffuse calcific disease noted.  I think repeat cardiac catheterization and possible PCI is indicated in Gregory setting. I have reviewed Gregory risks, indications, and alternatives to cardiac catheterization, possible angioplasty, and stenting with Gregory [name]. Risks include but are not limited to bleeding, infection, vascular injury, stroke, myocardial infection, arrhythmia, kidney injury,  radiation-related injury in Gregory case of prolonged fluoroscopy use, emergency cardiac surgery, and death. Gregory [name] understands Gregory risks of serious complication is 1-2 in 1000 with diagnostic cardiac cath and 1-2% or less with angioplasty/stenting.  [name]'s son is at Gregory bedside and understands our plan.  Tonny Bollman, M.D. 12/29/2022 9:54 AM

## 2022-12-29 NOTE — Progress Notes (Signed)
[place] for heparin  Indication: chest pain/ACS  Allergies  Allergen Reactions   Bupropion Other (See Comments)    Unsure about alleries   Valproic Acid Other (See Comments)    Unsure about this    Patient Measurements: Height: 6' (182.9 cm) Weight: 83.9 kg (184 lb 15.5 oz) IBW/kg (Calculated) : 77.6 HEPARIN DW (KG): 83.9   Vital Signs: Temp: 98.1 F (36.7 C) (01/10 2337) Temp Source: Oral (01/10 2337) BP: 99/70 (01/11 0130) Pulse Rate: 63 (01/11 0130)  Labs: Recent Labs    12/28/22 1618 12/28/22 1930 12/29/22 0141  HGB 13.2  --   --   HCT 39.2  --   --   PLT 159  --   --   HEPARINUNFRC  --   --  0.21*  CREATININE 1.38*  --  1.25*  TROPONINIHS 820* 669*  --      Estimated Creatinine Clearance: 46.6 mL/min (A) (by C-G formula based on SCr of 1.25 mg/dL (H)).   Medical History: Past Medical History:  Diagnosis Date   Abdominal aneurysm [facility])    Depression    History of kidney stones    Hyperlipidemia    Hypertension    Stroke [facility] [place]) 2004?   TIA    Assessment: Patient presents to ED with chest pain. Recent inpatient discharge on 1/8 with NSTEMI. Pharmacy consulted to dose heparin.   CBC wnl. Scr 1.38 elevated from baseline.   1/11 AM update:  Heparin level sub-therapeutic   Goal of Therapy:  Heparin level 0.3-0.7 units/ml Monitor platelets by anticoagulation protocol: Yes   Plan:  Heparin 2000 units bolus Inc heparin to 1100 units/hr 1100 heparin level  Narda Bonds, PharmD, BCPS Clinical Pharmacist Phone: [phone]

## 2022-12-29 NOTE — Progress Notes (Addendum)
Rounding Note    Patient Name: Gregory [name] Date of Encounter: 12/29/2022  [place] Cardiologist: Buford Dresser, MD   Subjective   Laying in bed, comfortable. Sleeping intermittently. Son at the bedside.   Inpatient Medications    Scheduled Meds:  ALPRAZolam  0.5 mg Oral Daily   ALPRAZolam  1 mg Oral QHS   aspirin EC  81 mg Oral Daily   atorvastatin  80 mg Oral Daily   clopidogrel  75 mg Oral Daily   mirtazapine  7.5 mg Oral QHS   omega-3 acid ethyl esters  1 g Oral Daily   sertraline  150 mg Oral Daily   Continuous Infusions:  heparin 1,100 Units/hr (12/29/22 0321)   nitroGLYCERIN 30 mcg/min (12/29/22 0322)   PRN Meds: acetaminophen, ondansetron (ZOFRAN) IV   Vital Signs    Vitals:   12/29/22 0400 12/29/22 0545 12/29/22 0600 12/29/22 0700  BP: (!) 105/36  102/64 98/62  Pulse: 67  (!) 57 (!) 58  Resp:   11 12  Temp:  98.2 F (36.8 C)    TempSrc:  Oral    SpO2:   94% 91%  Weight:      Height:        Intake/Output Summary (Last 24 hours) at 12/29/2022 0759 Last data filed at 12/29/2022 0932 Gross per 24 hour  Intake 84.03 ml  Output 300 ml  Net -215.97 ml      12/28/2022    4:15 PM 12/26/2022    5:28 AM 12/25/2022    5:09 AM  Last 3 Weights  Weight (lbs) 184 lb 15.5 oz 185 lb 187 lb 14.4 oz  Weight (kg) 83.9 kg 83.915 kg 85.231 kg      Telemetry    Sinus Rhythm - Personally Reviewed  ECG    Sinus Rhythm, TWI in  lead I, II, biphasic T wave in lateral leads with slight elevation in aVR (similar to prior tracings) - Personally Reviewed  Physical Exam   GEN: No acute distress.   Neck: No JVD Cardiac: RRR, no murmurs, rubs, or gallops.  Respiratory: Clear to auscultation bilaterally. GI: Soft, nontender, non-distended  MS: No edema; No deformity. Right radial cath site stable. Neuro:  Nonfocal  Psych: Normal affect   Labs    High Sensitivity Troponin:   Recent Labs  Lab 12/23/22 1849 12/24/22 1313 12/24/22 1508  12/28/22 1618 12/28/22 1930  TROPONINIHS 348* 155* 191* 820* 669*     Chemistry Recent Labs  Lab 12/26/22 0156 12/28/22 1618 12/29/22 0141  NA 136 137 137  K 3.9 4.1 3.9  CL 104 102 104  CO2 22 24 23   GLUCOSE 106* 149* 114*  BUN 22 21 19   CREATININE 1.39* 1.38* 1.25*  CALCIUM 9.1 10.4* 9.0  GFRNONAA 49* 50* 56*  ANIONGAP 10 11 10     Lipids  Recent Labs  Lab 12/25/22 0123  CHOL 120  TRIG 102  HDL 37*  LDLCALC 63  CHOLHDL 3.2    Hematology Recent Labs  Lab 12/25/22 0123 12/26/22 0156 12/28/22 1618  WBC 8.1 7.2 7.0  RBC 3.94* 4.00* 4.18*  HGB 12.1* 12.5* 13.2  HCT 36.8* 37.0* 39.2  MCV 93.4 92.5 93.8  MCH 30.7 31.3 31.6  MCHC 32.9 33.8 33.7  RDW 12.3 12.3 12.8  PLT 171 165 159   Thyroid No results for input(s): "TSH", "FREET4" in the last 168 hours.  BNP Recent Labs  Lab 12/24/22 1313  BNP 326.2*    DDimer  Recent Labs  Lab 12/23/22 1636  DDIMER 1.13*     Radiology    DG Chest 2 View  Result Date: 12/28/2022 CLINICAL DATA:  Chest pain EXAM: CHEST - 2 VIEW COMPARISON:  Chest x-ray 12/23/2022.  CT chest 12/23/2022. FINDINGS: The heart size and mediastinal contours are within normal limits. Right upper lobe nodular density is stable. The lungs are otherwise clear. There is no pleural effusion or pneumothorax. The visualized skeletal structures are unremarkable. IMPRESSION: 1. No active cardiopulmonary disease. 2. Stable right upper lobe nodular density. Electronically Signed   By: Ronney Asters M.D.   On: 12/28/2022 17:02    Cardiac Studies   Cath: 12/26/2022  IMPRESSION: [name] had a non-STEMI with troponins went up to the mid 300 range.  His EKG shows nonspecific ST and T wave changes.  His 2D echo does show wall motion abnormality with preserved EF.  CTA of his chest showed severe diffuse coronary calcification.  He was on DAPT for prior carotid stenting.  His coronary anatomy showed no significant obstructive disease surprisingly given the  degree of calcification.  His LVEDP was 19.  Medical therapy will be recommended.  The sheath was removed and a TR band was placed on the right wrist to achieve patent hemostasis.  The patient left lab in stable condition.  Dr. Kirk Ruths, the patient's attending cardiologist, was notified of these results.   [name]. MD, [facility] 12/26/2022 10:42 AM  Diagnostic Dominance: Right   Patient Profile     89 y.o. 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   Assessment & Plan    Elevated Troponin Chest pain -- hsTn 820>>669, recent admission troponin 155>> 191.  Cardiac catheterization 1/8 with mild nonobstructive disease of 50% mid LAD and 50% distal left circumflex with recommendations for medical therapy.  Presents back with pain similar to what prompted initial admission.  Difficult to interpret troponin levels, suspect could be trending down from recent admission question whether he has coronary vasospasm versus microvascular disease.  EKG was noted to be abnormal T wave inversion in lead I, II T waves in the lateral leads.  -- Will continue IV heparin and nitro, review with MD whether repeat cardiac catheterization would be indicated. -- Further increase Norvasc to 10 mg daily with plan to add Imdur once IV nitroglycerin stopped -- Continue aspirin, Plavix, statin  Hypertension -- Controlled -- Currently on IV nitroglycerin, will increase Norvasc to 10 mg daily  Hyperlipidemia -- Continue atorvastatin daily, Lovaza  History of PVCs -- Previously treated with flecainide, this was stopped in his recent hospitalization given coronary disease  Lung nodule -- Noted on CT during recent admission, recommendations for follow-up CT in 3 months  For questions or updates, please contact [place] Please  consult [email or web address] for contact info under        Signed, Reino Bellis, NP  12/29/2022, 7:59 AM    Patient seen, examined. Available data reviewed. Agree with findings, assessment, and plan as outlined by Reino Bellis, NP.  On my exam, this is an elderly male in no distress.  HEENT is normal, lungs are clear bilaterally, heart is regular rate and rhythm with no murmur gallop with distant heart sounds, abdomen is soft and nontender, right radial pulses 2+, there is no lower extremity edema, skin is warm  and dry with no rash.  High-sensitivity troponin yesterday peaked at 828, followed by a value of 669 3 hours later.  The patient's EKG shows sinus rhythm with ST and T wave abnormality consider inferolateral ischemia, but no significant change from previous tracings.  The patient's episode of chest discomfort yesterday is concerning.  It started abruptly and is described as central chest pain radiating to both arms.  Symptoms intensified and did not wane until he was started on IV nitroglycerin.  He is now on IV nitroglycerin and heparin and his chest pain-free.  While it would be unusual for him to have any significant change in coronary findings over just a few days, it is possible that he has had a plaque rupture event.  His troponin is more elevated and his symptoms were more severe at the time of his current presentation.  I personally reviewed his cardiac catheterization films with Dr. Barry who did his procedure a few days ago.  I completely agree that he had no high-grade coronary stenosis, but he did have moderate diffuse calcific disease noted.  I think repeat cardiac catheterization and possible PCI is indicated in the setting. I have reviewed the risks, indications, and alternatives to cardiac catheterization, possible angioplasty, and stenting with the patient. Risks include but are not limited to bleeding, infection, vascular injury, stroke, myocardial infection, arrhythmia, kidney injury,  radiation-related injury in the case of prolonged fluoroscopy use, emergency cardiac surgery, and death. The patient understands the risks of serious complication is 1-2 in 1000 with diagnostic cardiac cath and 1-2% or less with angioplasty/stenting.  Patient's son is at the bedside and understands our plan.  Elisheva Fallas, M.D. 12/29/2022 9:54 AM  

## 2022-12-29 NOTE — ED Notes (Signed)
Patient @ enroute to cath lab

## 2022-12-29 NOTE — Interval H&P Note (Signed)
Cath Lab Visit (complete for each Cath Lab visit)  Clinical Evaluation Leading to the Procedure:   ACS: Yes.    Non-ACS:    Anginal Classification: CCS II  Anti-ischemic medical therapy: Minimal Therapy (1 class of medications)  Non-Invasive Test Results: No non-invasive testing performed  Prior CABG: No previous CABG      History and Physical Interval Note:  12/29/2022 1:30 PM  Gregory [name]  has presented today for surgery, with the diagnosis of nstemi.  The various methods of treatment have been discussed with the patient and family. After consideration of risks, benefits and other options for treatment, the patient has consented to  Procedure(s): LEFT HEART CATH AND CORONARY ANGIOGRAPHY (N/A) as a surgical intervention.  The patient's history has been reviewed, patient examined, no change in status, stable for surgery.  I have reviewed the patient's chart and labs.  Questions were answered to the patient's satisfaction.     [name]

## 2022-12-30 ENCOUNTER — Encounter (HOSPITAL_COMMUNITY): Payer: Self-pay | Admitting: Cardiovascular Disease

## 2022-12-30 ENCOUNTER — Telehealth: Payer: Self-pay | Admitting: Physician Assistant

## 2022-12-30 DIAGNOSIS — I779 Disorder of arteries and arterioles, unspecified: Secondary | ICD-10-CM | POA: Insufficient documentation

## 2022-12-30 DIAGNOSIS — I2510 Atherosclerotic heart disease of native coronary artery without angina pectoris: Secondary | ICD-10-CM | POA: Insufficient documentation

## 2022-12-30 DIAGNOSIS — R739 Hyperglycemia, unspecified: Secondary | ICD-10-CM | POA: Insufficient documentation

## 2022-12-30 DIAGNOSIS — I714 Abdominal aortic aneurysm, without rupture, unspecified: Secondary | ICD-10-CM | POA: Insufficient documentation

## 2022-12-30 DIAGNOSIS — R911 Solitary pulmonary nodule: Secondary | ICD-10-CM | POA: Insufficient documentation

## 2022-12-30 DIAGNOSIS — I25110 Atherosclerotic heart disease of native coronary artery with unstable angina pectoris: Secondary | ICD-10-CM | POA: Insufficient documentation

## 2022-12-30 DIAGNOSIS — I214 Non-ST elevation (NSTEMI) myocardial infarction: Secondary | ICD-10-CM | POA: Diagnosis not present

## 2022-12-30 DIAGNOSIS — N1831 Chronic kidney disease, stage 3a: Secondary | ICD-10-CM | POA: Insufficient documentation

## 2022-12-30 LAB — CBC
HCT: 34.7 % — ABNORMAL LOW (ref 39.0–52.0)
Hemoglobin: 11.5 g/dL — ABNORMAL LOW (ref 13.0–17.0)
MCH: 31.1 pg (ref 26.0–34.0)
MCHC: 33.1 g/dL (ref 30.0–36.0)
MCV: 93.8 fL (ref 80.0–100.0)
Platelets: 170 10*3/uL (ref 150–400)
RBC: 3.7 MIL/uL — ABNORMAL LOW (ref 4.22–5.81)
RDW: 12.8 % (ref 11.5–15.5)
WBC: 7 10*3/uL (ref 4.0–10.5)
nRBC: 0 % (ref 0.0–0.2)

## 2022-12-30 LAB — HEPARIN LEVEL (UNFRACTIONATED)
Heparin Unfractionated: 0.21 IU/mL — ABNORMAL LOW (ref 0.30–0.70)
Heparin Unfractionated: 0.38 IU/mL (ref 0.30–0.70)

## 2022-12-30 LAB — BASIC METABOLIC PANEL
Anion gap: 8 (ref 5–15)
BUN: 18 mg/dL (ref 8–23)
CO2: 22 mmol/L (ref 22–32)
Calcium: 8.9 mg/dL (ref 8.9–10.3)
Chloride: 106 mmol/L (ref 98–111)
Creatinine, Ser: 1.29 mg/dL — ABNORMAL HIGH (ref 0.61–1.24)
GFR, Estimated: 54 mL/min — ABNORMAL LOW (ref >60)
Glucose, Bld: 92 mg/dL (ref 70–99)
Potassium: 3.8 mmol/L (ref 3.5–5.1)
Sodium: 136 mmol/L (ref 135–145)

## 2022-12-30 MED ORDER — NITROGLYCERIN 0.4 MG SL SUBL: 0.4000 mg | SUBLINGUAL_TABLET | SUBLINGUAL | 3 refills | Status: DC | PRN

## 2022-12-30 MED ORDER — AMLODIPINE BESYLATE 5 MG PO TABS: 5.0000 mg | ORAL_TABLET | Freq: Every day | ORAL | Status: DC

## 2022-12-30 MED ORDER — ISOSORBIDE MONONITRATE ER 30 MG PO TB24: 30.0000 mg | ORAL_TABLET | Freq: Every day | ORAL | Status: DC

## 2022-12-30 MED ORDER — ISOSORBIDE MONONITRATE ER 30 MG PO TB24: 15.0000 mg | ORAL_TABLET | Freq: Every day | ORAL | 5 refills | Status: DC

## 2022-12-30 NOTE — TOC Transition Note (Signed)
Transition of Care [facility]) - CM/SW Discharge Note   Patient Details  Name: Gregory [name] MRN: [id number] Date of Birth: 01/21/1934  Transition of Care [facility]) CM/SW Contact:  Zenon Mayo, RN Phone Number: 12/30/2022, 12:52 PM   Clinical Narrative:    Patient is for dc home today, son is at the bedside, he has no needs.         Patient Goals and CMS Choice      Discharge Placement                         Discharge Plan and Services Additional resources added to the After Visit Summary for                                       Social Determinants of Health (SDOH) Interventions SDOH Screenings   Tobacco Use: Medium Risk (12/30/2022)     Readmission Risk Interventions     No data to display

## 2022-12-30 NOTE — Progress Notes (Signed)
ANTICOAGULATION CONSULT NOTE   Pharmacy Consult for heparin  Indication: chest pain/ACS  Allergies  Allergen Reactions   Bupropion Other (See Comments)    Unsure about alleries   Valproic Acid Other (See Comments)    Unsure about this    Patient Measurements: Height: 6' (182.9 cm) Weight: 84.3 kg (185 lb 13.6 oz) IBW/kg (Calculated) : 77.6 HEPARIN DW (KG): 84.3   Vital Signs: Temp: 97.8 F (36.6 C) (01/11 1945) Temp Source: Oral (01/11 1945) BP: 135/67 (01/11 1945) Pulse Rate: 68 (01/11 1945)  Labs: Recent Labs    12/28/22 1618 12/28/22 1930 12/29/22 0141 12/29/22 1106 12/30/22 0110  HGB 13.2  --   --   --  11.5*  HCT 39.2  --   --   --  34.7*  PLT 159  --   --   --  170  HEPARINUNFRC  --   --  0.21* 0.35 0.21*  CREATININE 1.38*  --  1.25*  --  1.29*  TROPONINIHS 820* 669*  --   --   --      Estimated Creatinine Clearance: 45.1 mL/min (A) (by C-G formula based on SCr of 1.29 mg/dL (H)).   Medical History: Past Medical History:  Diagnosis Date   Abdominal aneurysm [facility])    Depression    History of kidney stones    Hyperlipidemia    Hypertension    Stroke [facility]) 2004?   TIA    Assessment: Patient presents to ED with chest pain. Recent inpatient discharge on 1/8 with NSTEMI. Pharmacy consulted to dose heparin.   Heparin level came back therapeutic prior to cath at 0.35, on heparin infusion at 1100 units/hr. Underwent cath today finding similar anatomy but possible 70-80% lesion at septal perforator just before an aneurysmal segment which is not ideal for PCI. Will restart heparin 4 hours after sheath removal (documented on 1/11@1406 ).  1/12 AM update:  Heparin level low  Goal of Therapy:  Heparin level 0.3-0.7 units/ml Monitor platelets by anticoagulation protocol: Yes   Plan:  Inc heparin to 1200 units/hr 1300 heparin level  Narda Bonds, PharmD, BCPS Clinical Pharmacist Phone: [phone]

## 2022-12-30 NOTE — Progress Notes (Signed)
ANTICOAGULATION CONSULT NOTE   Pharmacy Consult for heparin  Indication: chest pain/ACS  Allergies  Allergen Reactions   Bupropion Other (See Comments)    Unsure about alleries   Valproic Acid Other (See Comments)    Unsure about this    Patient Measurements: Height: 6' (182.9 cm) Weight: 85.3 kg (188 lb) IBW/kg (Calculated) : 77.6 HEPARIN DW (KG): 84.3   Vital Signs: Temp: 98.1 F (36.7 C) (01/12 1100) Temp Source: Oral (01/12 1100) BP: 125/74 (01/12 1100) Pulse Rate: 55 (01/12 1100)  Labs: Recent Labs    12/28/22 1618 12/28/22 1930 12/29/22 0141 12/29/22 0141 12/29/22 1106 12/30/22 0110 12/30/22 0955  HGB 13.2  --   --   --   --  11.5*  --   HCT 39.2  --   --   --   --  34.7*  --   PLT 159  --   --   --   --  170  --   HEPARINUNFRC  --   --  0.21*   < > 0.35 0.21* 0.38  CREATININE 1.38*  --  1.25*  --   --  1.29*  --   TROPONINIHS 820* 669*  --   --   --   --   --    < > = values in this interval not displayed.     Estimated Creatinine Clearance: 45.1 mL/min (A) (by C-G formula based on SCr of 1.29 mg/dL (H)).   Medical History: Past Medical History:  Diagnosis Date   Abdominal aneurysm [facility])    Depression    History of kidney stones    Hyperlipidemia    Hypertension    Stroke [facility]) 2004?   TIA    Assessment: Patient presents to ED with chest pain. Recent inpatient discharge on 1/8 with NSTEMI. Pharmacy consulted to dose heparin.   Heparin level came back therapeutic at 0.35, on heparin infusion at 1200 units/hr. Hgb 11.5, plt 170. No s/sx of bleeding or infusion issues.  Goal of Therapy:  Heparin level 0.3-0.7 units/ml Monitor platelets by anticoagulation protocol: Yes   Plan:  Stop heparin given plan for discharge today   Antonietta Jewel, PharmD, [place] Pharmacist  Phone: [phone] 12/30/2022 12:21 PM  Please check AMION for all [place] phone numbers After 10:00 PM, call [place] [phone]

## 2022-12-30 NOTE — Telephone Encounter (Addendum)
   Attention TOC pool,  This patient will need a TOC phone call after discharge. They are being discharged likely today. Follow-up appointment has already been arranged with: Laurann Montana 01/10/23 They are a patient of Buford Dresser, MD.  Thank you! Charlie Pitter, PA-C

## 2022-12-30 NOTE — Progress Notes (Signed)
CARDIAC REHAB PHASE I   PRE:  Rate/Rhythm: 65 SR  BP:  Sitting 125/74      SaO2: 96 RA  MODE:  Ambulation: 240 ft   POST:  Rate/Rhythm: 60 SR  BP:  Sitting: 140/70      SaO2: 94 RA   Pt ambulated independently in hall maintaining slow steady pace. . Tolerated with no CP or SOB. Returned to chair with call bell and bedside table in reach. Post MI education including MI booklet, heart healthy diet, site care, restrictions, risk factors, exercise guidelines and CRP2 reviewed. All questions and concerns addressed. Will refer to [facility] for CRP2. Plan for discharge home today.   [id number] Vanessa Barbara, RN BSN 12/30/2022 1:45 PM

## 2022-12-30 NOTE — TOC Progression Note (Signed)
Transition of Care [facility]) - Progression Note    Patient Details  Name: Gregory [name] MRN: [id number] Date of Birth: 1934/06/10  [facility] CM/SW Contact  Zenon Mayo, RN Phone Number: 12/30/2022, 12:51 PM  Clinical Narrative:    From home, NSTEMI, had cath, which was good, he is for possible dc today.         Expected Discharge Plan and Services                                               Social Determinants of Health (SDOH) Interventions SDOH Screenings   Tobacco Use: Medium Risk (12/30/2022)    Readmission Risk Interventions     No data to display

## 2022-12-30 NOTE — Discharge Summary (Addendum)
Discharge Summary    Patient ID: Gregory [name] MRN: [id number]; DOB: July 31, 1934  Admit date: 12/28/2022 Discharge date: 12/30/2022  PCP:  Gregory Sacramento, MD   [place] Providers Cardiologist:  Buford Dresser, MD        Discharge Diagnoses    Principal Problem:   Non-STEMI (non-ST elevated myocardial infarction) [facility]) Active Problems:   Primary hypertension   History of CVA (cerebrovascular accident)   Stage 3a chronic kidney disease (CKD) ([place])   Hyperglycemia   Lung nodule   CAD (coronary artery disease)   AAA (abdominal aortic aneurysm) without rupture ([place])   Carotid artery disease [facility])    Diagnostic Studies/Procedures    Cath 12/29/22   Prox LAD to Mid LAD lesion is 50% stenosed.   Dist Cx lesion is 50% stenosed.   Prox LAD lesion is 80% stenosed.  IMPRESSION: [name] was readmitted with a non-STEMI several days after discharge from a recent admission for a similar presentation at which time heart catheterization performed by myself on 12/26/2022 showed what I felt was nonobstructive disease with diffuse calcified vessels.  Heart cath today shows similar anatomy although focusing on the proximal LAD there does not seem to be a 75 to 80% lesion at a septal perforator just before an aneurysmal segment which is not ideal for percutaneous intervention.  I favor continued medical therapy.  The sheath was removed and a TR band was placed on the right wrist to achieve patent hemostasis.  The patient is already on DAPT with aspirin and clopidogrel.  I will restart heparin in 4 hours at which we will continue for 48 hours after which she will be discharged home.  If he has recurrent symptoms we may have to consider alternative revascularization strategies such as CABG (LIMA to LAD).   [name]. MD, [facility] 12/29/2022 2:20 PM   Echo last admission 12/24/22  1. Left ventricular ejection fraction, by estimation, is 50 to 55%. The  left ventricle has low  normal function. The left ventricle demonstrates  regional wall motion abnormalities (see scoring diagram/findings for  description). There is mild concentric  left ventricular hypertrophy. Left ventricular diastolic parameters are  indeterminate. There is mild hypokinesis of the left ventricular, entire  anterolateral wall and anterior wall.   2. Right ventricular systolic function is normal. The right ventricular  size is normal. There is normal pulmonary artery systolic pressure.   3. The mitral valve is grossly normal. Trivial mitral valve  regurgitation. No evidence of mitral stenosis.   4. The aortic valve is calcified. There is moderate calcification of the  aortic valve. There is moderate thickening of the aortic valve. Aortic  valve regurgitation is not visualized. Aortic valve  sclerosis/calcification is present, without any evidence  of aortic stenosis.   5. Aortic dilatation noted. There is mild dilatation of the aortic root,  measuring 42 mm.   6. The inferior vena cava is normal in size with greater than 50%  respiratory variability, suggesting right atrial pressure of 3 mmHg.  Comparison(s): No prior Echocardiogram  Conclusion(s)/Recommendation(s): Low normal LVEF. With echo contrast,  parasternal wall motion appears largely normal, but on apical images there  may be mild hypokinesis of the anterior and anterolateral wall.   _____________   History of Present Illness     Gregory [name] is a 89 y.o. 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   He was recently admitted for NSTEMI and discharged on 1/8.  Workup at that time was notable for CTA negative for PE but did show considerable coronary calcifications and stable infrarenal AAA. Echo was obtained  that demonstrated EF 50 to 55%, mild hypokinesis of the anterior and anterolateral wall, normal RV, trivial MR, mildly dilated aortic root measuring 42 mm. Patient ultimately underwent cardiac catheterization on 12/26/2022 which revealed 50% distal left circumflex lesion, 50% proximal to mid LAD lesion. Medical therapy was recommended. LVEDP was 19. He was already on dual antiplatelet therapy for prior carotid stenting. He was discharged on 12/26/22. He returned to the hospital on 10/29/22 upon transfer from [facility] ER with recurrent chest pain and recurrent NSTEMI. He was subsequently admitted to the cardiology service.   Hospital Course     1. NSTEMI/CAD, HLD - recent cath 12/26/22, managed medically - repeat cath 12/29/22, results above, again recommended for medical therapy  - the initial plan was to increase amlodipine to 10mg  which he received today -> Dr. reviewed films and suggests to keep amlodipine at 5mg  daily and start Imdur 15mg  daily tomorrow (SBP 110-120s), with consideration of titration to 30mg  in follow-up if tolerated - to remain on ASA + Plavix at discharge - not on beta blocker given baseline sinus bradycardia HR 50s-60s - LDL 63 by labs 12/25/22 - atorvastatin titrated during recent admission - if the patient is tolerating statin at time of follow-up appointment, would consider rechecking liver function/lipid panel in 6-8 weeks  2. Essential HTN - controlled, follow with med changes above  3. History of PVCs - previously treated with flecainide, discontinued given coronary disease - can f/u EP as outpatient if this becomes an issue off flecainide  4. Lung nodule - noted on CT during recent admission: " 9 mm anterolateral right upper lobe noncalcified lung nodule. Consider one of the following in 3 months for both low-risk and high-risk individuals: (a) repeat chest CT, (b) follow-up PET-CT, or (c) tissue sampling. This recommendation follows the consensus statement:  Guidelines for Management of Incidental Pulmonary Nodules" - recommend f/u PCP to discuss next steps - pt's son verbalized understanding  5. Hyperglycemia - A1C 6.5 during recent admission, no hx of DM - recommend close f/u with PCP  6. CKD stage 3a - prior OP labs in [facility] show Cr 1.17-1.25 reflective of CKD stage 2-3a - appeared stable here in the hospital this admission  7. AAA (4x3.6cm by CT 12/23/22), prior carotid artery disease - recommend to follow as outpatient  Dr. has seen and examined the patient today and feels he is stable for discharge - cardiac rehab knows to see before leaving. TOC f/u arranged.   Confirmed with care management that code 44 was resolved prior to dc.    Assessment & Plan      Did the patient have an acute coronary syndrome (MI, NSTEMI, STEMI, etc) this admission?:  Yes                               AHA/ACC Clinical Performance & Quality Measures: Aspirin prescribed? - Yes ADP Receptor Inhibitor (Plavix/Clopidogrel, Brilinta/Ticagrelor or Effient/Prasugrel) prescribed (includes medically managed patients)? - Yes Beta Blocker prescribed? - No - sinus bradycardia High Intensity Statin (Lipitor 40-80mg  or Crestor 20-40mg ) prescribed? - Yes EF assessed during THIS hospitalization? -  No - recent echo - MD did not feel needed For EF <40%, was ACEI/ARB prescribed? - Not Applicable (EF >/= 40%) For EF <40%, Aldosterone Antagonist (Spironolactone or Eplerenone) prescribed? - Not Applicable (EF >/= 40%) Cardiac Rehab Phase II ordered (including medically managed patients)? - Yes - last admission     The patient has been scheduled for TOC f/u and phone call request placed. _____________  Discharge Vitals Blood pressure 125/74, pulse (!) 55, temperature 98.1 F (36.7 C), temperature source Oral, resp. rate 17, height 6' (1.829 m), weight 85.3 kg, SpO2 95 %.  Filed Weights   12/28/22 1615 12/29/22 1717 12/30/22 0400  Weight: 83.9 kg 84.3 kg  85.3 kg   General: Well developed, well nourished, in no acute distress. Head: Normocephalic, atraumatic, sclera non-icteric, no xanthomas, nares are without discharge. Neck: Negative for carotid bruits. JVP not elevated. Lungs: Clear bilaterally to auscultation without wheezes, rales, or rhonchi. Breathing is unlabored. Heart: RRR S1 S2 without murmurs, rubs, or gallops.  Abdomen: Soft, non-tender, non-distended with normoactive bowel sounds. No rebound/guarding. Extremities: No clubbing or cyanosis. No edema. Distal pedal pulses are 2+ and equal bilaterally. Right radial cath site without hematoma or ecchymosis; good pulse. Neuro: Alert and oriented X 3. Moves all extremities spontaneously. Psych:  Responds to questions appropriately with a normal affect.   Labs & Radiologic Studies    CBC Recent Labs    12/28/22 1618 12/30/22 0110  WBC 7.0 7.0  HGB 13.2 11.5*  HCT 39.2 34.7*  MCV 93.8 93.8  PLT 159 170   Basic Metabolic Panel Recent Labs    84/69/62 0141 12/30/22 0110  NA 137 136  K 3.9 3.8  CL 104 106  CO2 23 22  GLUCOSE 114* 92  BUN 19 18  CREATININE 1.25* 1.29*  CALCIUM 9.0 8.9   Liver Function Tests No results for input(s): "AST", "ALT", "ALKPHOS", "BILITOT", "PROT", "ALBUMIN" in the last 72 hours. No results for input(s): "LIPASE", "AMYLASE" in the last 72 hours. High Sensitivity Troponin:   Recent Labs  Lab 12/23/22 1849 12/24/22 1313 12/24/22 1508 12/28/22 1618 12/28/22 1930  TROPONINIHS 348* 155* 191* 820* 669*    _____________  CARDIAC CATHETERIZATION  Result Date: 12/29/2022 Images from the original result were not included.   Prox LAD to Mid LAD lesion is 50% stenosed.   Dist Cx lesion is 50% stenosed.   Prox LAD lesion is 80% stenosed. Gregory [name] is a 89 y.o. male  [id number] LOCATION:  FACILITY: [facility] PHYSICIAN: Gregory Batty, M.D. June 18, 1934 DATE OF PROCEDURE:  12/29/2022 DATE OF DISCHARGE: CARDIAC CATHETERIZATION History obtained from chart  review.89 y.o. 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  He was catheterized on 12/26/2022 by myself revealing diffuse coronary calcification with what appeared to be nonobstructive CAD.  He was discharged him and returned with recurrent chest pain and elevated enzymes.  He presents now for diagnostic coronary angiography.   Mr. Kamer was readmitted with a non-STEMI several days after discharge from a recent admission for a similar presentation at which time heart catheterization performed by myself on 12/26/2022 showed what I felt was nonobstructive disease with diffuse calcified vessels.  Heart cath today shows similar anatomy although focusing on the proximal LAD there does not seem to be a 75 to 80% lesion at a septal perforator just before  an aneurysmal segment which is not ideal for percutaneous intervention.  I favor continued medical therapy.  The sheath was removed and a TR band was placed on the right wrist to achieve patent hemostasis.  The patient is already on DAPT with aspirin and clopidogrel.  I will restart heparin in 4 hours at which we will continue for 48 hours after which she will be discharged home.  If he has recurrent symptoms we may have to consider alternative revascularization strategies such as CABG (LIMA to LAD). Gregory [name]. MD, [facility] 12/29/2022 2:20 PM    DG Chest 2 View  Result Date: 12/28/2022 CLINICAL DATA:  Chest pain EXAM: CHEST - 2 VIEW COMPARISON:  Chest x-ray 12/23/2022.  CT chest 12/23/2022. FINDINGS: The heart size and mediastinal contours are within normal limits. Right upper lobe nodular density is stable. The lungs are otherwise clear. There is no pleural effusion or pneumothorax. The visualized skeletal structures are unremarkable. IMPRESSION: 1. No active cardiopulmonary disease. 2. Stable right  upper lobe nodular density. Electronically Signed   By: Darliss CheneyAmy  Guttmann M.D.   On: 12/28/2022 17:02   CARDIAC CATHETERIZATION  Result Date: 12/26/2022 Images from the original result were not included.   Dist Cx lesion is 50% stenosed.   Prox LAD to Mid LAD lesion is 50% stenosed. Gregory [name] is a 89 y.o. male  [id number] LOCATION:  FACILITY: [facility] PHYSICIAN: Gregory [name], M.D. 03/04/1934 DATE OF PROCEDURE:  12/26/2022 DATE OF DISCHARGE: CARDIAC CATHETERIZATION History obtained from chart review.89 y.o. male with past medical history of CVA, carotid artery disease status post carotid endarterectomy, abdominal aortic aneurysm, hypertension admitted with non-ST elevation myocardial infarction.  Echocardiogram shows ejection fraction 50 to 55%, mild left ventricular hypertrophy, hypokinesis of the anterior and anterior lateral wall and dilated aortic root at 42 mm.  CTA shows no pulmonary embolus, 9 mm right upper lobe nodule, 4 x 3.6 cm infrarenal abdominal aortic aneurysm.   Mr. Alwyn RenHopper had a non-STEMI with troponins went up to the mid 300 range.  His EKG shows nonspecific ST and T wave changes.  His 2D echo does show wall motion abnormality with preserved EF.  CTA of his chest showed severe diffuse coronary calcification.  He was on DAPT for prior carotid stenting.  His coronary anatomy showed no significant obstructive disease surprisingly given the degree of calcification.  His LVEDP was 19.  Medical therapy will be recommended.  The sheath was removed and a TR band was placed on the right wrist to achieve patent hemostasis.  The patient left lab in stable condition.  Dr. Olga MillersBrian Crenshaw, the patient's attending cardiologist, was notified of these results. Gregory [name]. MD, [facility] 12/26/2022 10:42 AM    ECHOCARDIOGRAM COMPLETE  Result Date: 12/24/2022    ECHOCARDIOGRAM REPORT   Patient Name:   Gregory LivelyCLAUDE D Overholt Date of Exam: 12/24/2022 Medical Rec #:  [id number]       Height:       72.0 in Accession #:    [id number]       Weight:       179.9 lb Date of Birth:  04/15/1934       BSA:          2.037 m Patient Age:    88 years        BP:           132/105 mmHg Patient Gender: M               HR:  64 bpm. Exam Location:  Inpatient Procedure: 2D Echo and Intracardiac Opacification Agent Indications:    chest pain  History:        Patient has no prior history of Echocardiogram examinations.                 Stroke; Risk Factors:Dyslipidemia and Hypertension.  Sonographer:    Cathie Hoops Referring Phys: [id number] ANGELA NICOLE DUKE  Sonographer Comments: Technically difficult study due to poor echo windows. Image acquisition challenging due to respiratory motion. IMPRESSIONS  1. Left ventricular ejection fraction, by estimation, is 50 to 55%. The left ventricle has low normal function. The left ventricle demonstrates regional wall motion abnormalities (see scoring diagram/findings for description). There is mild concentric left ventricular hypertrophy. Left ventricular diastolic parameters are indeterminate. There is mild hypokinesis of the left ventricular, entire anterolateral wall and anterior wall.  2. Right ventricular systolic function is normal. The right ventricular size is normal. There is normal pulmonary artery systolic pressure.  3. The mitral valve is grossly normal. Trivial mitral valve regurgitation. No evidence of mitral stenosis.  4. The aortic valve is calcified. There is moderate calcification of the aortic valve. There is moderate thickening of the aortic valve. Aortic valve regurgitation is not visualized. Aortic valve sclerosis/calcification is present, without any evidence of aortic stenosis.  5. Aortic dilatation noted. There is mild dilatation of the aortic root, measuring 42 mm.  6. The inferior vena cava is normal in size with greater than 50% respiratory variability, suggesting right atrial pressure of 3 mmHg. Comparison(s): No prior Echocardiogram. Conclusion(s)/Recommendation(s): Low normal LVEF.  With echo contrast, parasternal wall motion appears largely normal, but on apical images there may be mild hypokinesis of the anterior and anterolateral wall. FINDINGS  Left Ventricle: Left ventricular ejection fraction, by estimation, is 50 to 55%. The left ventricle has low normal function. The left ventricle demonstrates regional wall motion abnormalities. Mild hypokinesis of the left ventricular, entire anterolateral wall and anterior wall. Definity contrast agent was given IV to delineate the left ventricular endocardial borders. The left ventricular internal cavity size was normal in size. There is mild concentric left ventricular hypertrophy. Left ventricular diastolic parameters are indeterminate. Right Ventricle: The right ventricular size is normal. No increase in right ventricular wall thickness. Right ventricular systolic function is normal. There is normal pulmonary artery systolic pressure. The tricuspid regurgitant velocity is 1.76 m/s, and  with an assumed right atrial pressure of 3 mmHg, the estimated right ventricular systolic pressure is 15.4 mmHg. Left Atrium: Left atrial size was normal in size. Right Atrium: Right atrial size was normal in size. Pericardium: There is no evidence of pericardial effusion. Mitral Valve: The mitral valve is grossly normal. Trivial mitral valve regurgitation. No evidence of mitral valve stenosis. Tricuspid Valve: The tricuspid valve is normal in structure. Tricuspid valve regurgitation is trivial. No evidence of tricuspid stenosis. Aortic Valve: The aortic valve is calcified. There is moderate calcification of the aortic valve. There is moderate thickening of the aortic valve. Aortic valve regurgitation is not visualized. Aortic valve sclerosis/calcification is present, without any  evidence of aortic stenosis. Aortic valve mean gradient measures 5.3 mmHg. Aortic valve peak gradient measures 9.6 mmHg. Aortic valve area, by VTI measures 2.10 cm. Pulmonic Valve: The  pulmonic valve was not well visualized. Pulmonic valve regurgitation is not visualized. No evidence of pulmonic stenosis. Aorta: Aortic dilatation noted. There is mild dilatation of the aortic root, measuring 42 mm. Venous: The inferior vena cava is normal in  size with greater than 50% respiratory variability, suggesting right atrial pressure of 3 mmHg. IAS/Shunts: The atrial septum is grossly normal.  LEFT VENTRICLE PLAX 2D LVIDd:         4.90 cm      Diastology LVIDs:         3.30 cm      LV e' medial:    6.96 cm/s LV PW:         1.20 cm      LV E/e' medial:  11.8 LV IVS:        1.20 cm      LV e' lateral:   7.62 cm/s LVOT diam:     1.90 cm      LV E/e' lateral: 10.7 LV SV:         67 LV SV Index:   33 LVOT Area:     2.84 cm  LV Volumes (MOD) LV vol d, MOD A2C: 70.1 ml LV vol d, MOD A4C: 135.0 ml LV vol s, MOD A2C: 48.7 ml LV vol s, MOD A4C: 62.7 ml LV SV MOD A2C:     21.4 ml LV SV MOD A4C:     135.0 ml LV SV MOD BP:      51.1 ml RIGHT VENTRICLE RV Basal diam:  3.10 cm RV Mid diam:    2.90 cm RV S prime:     11.70 cm/s TAPSE (M-mode): 1.9 cm LEFT ATRIUM             Index        RIGHT ATRIUM          Index LA diam:        3.40 cm 1.67 cm/m   RA Area:     9.45 cm LA Vol (A2C):   38.9 ml 19.10 ml/m  RA Volume:   19.00 ml 9.33 ml/m LA Vol (A4C):   47.2 ml 23.17 ml/m LA Biplane Vol: 43.1 ml 21.16 ml/m  AORTIC VALVE                     PULMONIC VALVE AV Area (Vmax):    2.09 cm      PV Vmax:       1.50 m/s AV Area (Vmean):   1.98 cm      PV Peak grad:  9.0 mmHg AV Area (VTI):     2.10 cm AV Vmax:           155.00 cm/s AV Vmean:          104.667 cm/s AV VTI:            0.319 m AV Peak Grad:      9.6 mmHg AV Mean Grad:      5.3 mmHg LVOT Vmax:         114.00 cm/s LVOT Vmean:        73.200 cm/s LVOT VTI:          0.236 m LVOT/AV VTI ratio: 0.74  AORTA Ao Root diam: 4.20 cm Ao Asc diam:  3.70 cm MITRAL VALVE               TRICUSPID VALVE MV Area (PHT): 4.80 cm    TR Peak grad:   12.4 mmHg MV Decel Time: 158 msec     TR Vmax:        176.00 cm/s MR Peak grad: 8.6 mmHg MR Vmax:      147.00 cm/s  SHUNTS MV E velocity: 81.90  cm/s  Systemic VTI:  0.24 m MV A velocity: 84.00 cm/s  Systemic Diam: 1.90 cm MV E/A ratio:  0.98 Gregory RedBridgette Christopher MD Electronically signed by Gregory RedBridgette Christopher MD Signature Date/Time: 12/24/2022/8:53:51 PM    Final    US EKG SITE RITE  Result Date: 12/24/2022 If Site Rite image not attached, placement could not be confirmed due to current cardiac rhythm.  CT Angio Chest/Abd/Pel for Dissection W and/or Wo Contrast  Result Date: 12/23/2022 CLINICAL DATA:  Chest pain. EXAM: CT ANGIOGRAPHY CHEST, ABDOMEN AND PELVIS TECHNIQUE: Non-contrast CT of the chest was initially obtained. Multidetector CT imaging through the chest, abdomen and pelvis was performed using the standard protocol during bolus administration of intravenous contrast. Multiplanar reconstructed images and MIPs were obtained and reviewed to evaluate the vascular anatomy. RADIATION DOSE REDUCTION: This exam was performed according to the departmental dose-optimization program which includes automated exposure control, adjustment of the mA and/or kV according to patient size and/or use of iterative reconstruction technique. CONTRAST:  100mL OMNIPAQUE IOHEXOL 350 MG/ML SOLN COMPARISON:  April 12, 2018 FINDINGS: CTA CHEST FINDINGS Cardiovascular: There is marked severity calcification of the aortic arch, without evidence of aortic aneurysm or dissection. Satisfactory opacification of the pulmonary arteries to the segmental level. No evidence of pulmonary embolism. Normal heart size with marked severity coronary artery calcification. No pericardial effusion. Mediastinum/Nodes: No enlarged mediastinal, hilar, or axillary lymph nodes. Thyroid gland, trachea, and esophagus demonstrate no significant findings. Lungs/Pleura: A 9 mm anterolateral right upper lobe noncalcified lung nodule is seen. There is no evidence of an acute infiltrate, pleural  effusion or pneumothorax. Musculoskeletal: No chest wall abnormality. No acute or significant osseous findings. Review of the MIP images confirms the above findings. CTA ABDOMEN AND PELVIS FINDINGS VASCULAR Aorta: 4.0 cm x 3.6 cm aneurysmal dilatation of the infrarenal abdominal aorta without evidence of dissection, vasculitis or significant stenosis. Celiac: Patent without evidence of aneurysm, dissection, vasculitis or significant stenosis. SMA: Patent without evidence of aneurysm, dissection, vasculitis or significant stenosis. Renals: Both renal arteries are patent without evidence of aneurysm, dissection, vasculitis, fibromuscular dysplasia or significant stenosis. IMA: Patent without evidence of aneurysm, dissection, vasculitis or significant stenosis. Inflow: Patent without evidence of aneurysm, dissection, vasculitis or significant stenosis. Veins: No obvious venous abnormality within the limitations of this arterial phase study. Review of the MIP images confirms the above findings. NON-VASCULAR Hepatobiliary: No focal liver abnormality is seen. No gallstones, gallbladder wall thickening, or biliary dilatation. Pancreas: Unremarkable. No pancreatic ductal dilatation or surrounding inflammatory changes. Spleen: Normal in size without focal abnormality. Adrenals/Urinary Tract: Adrenal glands are unremarkable. Kidneys are normal, without renal calculi, focal lesion, or hydronephrosis. Bladder is unremarkable. Stomach/Bowel: There is a small hiatal hernia. Surgically anastomosed bowel is seen within the anterior aspect of the mid to lower right abdomen. No evidence of bowel wall thickening, distention, or inflammatory changes. Noninflamed diverticula are seen throughout the sigmoid colon. Lymphatic: No abnormal abdominal or pelvic lymph nodes are identified. Reproductive: Prostate gland is mildly enlarged. Other: No abdominal wall hernia or abnormality. No abdominopelvic ascites. Musculoskeletal: No acute or  significant osseous findings. Review of the MIP images confirms the above findings. IMPRESSION: 1. No evidence of pulmonary embolism or other acute intrathoracic process. 2. 9 mm anterolateral right upper lobe noncalcified lung nodule. Consider one of the following in 3 months for both low-risk and high-risk individuals: (a) repeat chest CT, (b) follow-up PET-CT, or (c) tissue sampling. This recommendation follows the consensus statement: Guidelines for Management of Incidental Pulmonary  Nodules Detected on CT Images: From the Fleischner Society 2017; Radiology 2017; 284:[id number]. 3. 4.0 cm x 3.6 cm infrarenal abdominal aortic aneurysm. 4. Sigmoid diverticulosis. 5. Small hiatal hernia. 6. Aortic atherosclerosis. Aortic Atherosclerosis (ICD10-I70.0). Electronically Signed   By: Aram Candelahaddeus  Houston M.D.   On: 12/23/2022 18:56   DG Chest Port 1 View  Result Date: 12/23/2022 CLINICAL DATA:  Chest pain EXAM: PORTABLE CHEST 1 VIEW COMPARISON:  05/09/2021 FINDINGS: The heart size and mediastinal contours are within normal limits. Spiculated appearing nodular opacity projecting over the right pulmonary apex. No acute appearing airspace opacity. Small, definitively benign calcified nodules of the left upper lobe. The visualized skeletal structures are unremarkable. IMPRESSION: 1. No acute appearing airspace opacity. 2. Spiculated appearing nodular opacity projecting over the right pulmonary apex. This is possibly a rib end calcification or other non parenchymal finding; recommend nonemergent CT to further evaluate. Electronically Signed   By: Jearld LeschAlex D Bibbey M.D.   On: 12/23/2022 17:03   Disposition   Pt is being discharged home today in good condition.  Follow-up Plans & Appointments     Follow-up Information     Alver SorrowWalker, Caitlin S, NP Follow up.   Specialty: Cardiology Why: Keep your cardiology follow-up appointment scheduled at [facility] on Tuesday Jan 24, 2023 11:20 AM (Arrive by 11:05 AM). Luther ParodyCaitlin is one of our nurse practitioners with our team. Contact information: [address] [place] [address] [phone]         Barbie BannerWilson, Fred H, MD. Go on 01/11/2023.   Specialty: Family Medicine Why: To discuss management of blood sugar and lung nodule@11 :20 with Ardell IsaacsNatalie James Contact information: [address] [place] [address] [phone]                Discharge Instructions     AMB referral to Phase II Cardiac Rehabilitation   Complete by: As directed    Diagnosis: NSTEMI   After initial evaluation and assessments completed: Virtual Based Care may be provided alone or in conjunction with Phase 2 Cardiac Rehab based on patient barriers.: Yes   Intensive Cardiac Rehabilitation (ICR) [facility] location only OR Traditional Cardiac Rehabilitation (TCR) *If criteria for ICR are not met will enroll in TCR [facility] only[facility] Yes   Diet - low sodium heart healthy   Complete by: As directed    Increase activity slowly   Complete by: As directed    No driving until cleared by your cardiology team. Do not resume driving if you have previously been advised not to drive for other medical reasons. No lifting over 10 lbs for 2 weeks. No sexual activity for 2 weeks. Keep procedure site clean & dry. If you notice increased pain, swelling, bleeding or pus, call/return!  You may shower, but no soaking baths/hot tubs/pools for 1 week.        Discharge Medications   Allergies as of 12/30/2022       Reactions   Bupropion Other (See Comments)   Unsure about alleries   Valproic Acid Other (See Comments)   Unsure about this        Medication List     TAKE these medications    ALPRAZolam 0.5 MG tablet Commonly known as: XANAX Take 0.5 mg by mouth every evening. Take 0.5 mg in the morning and 1 mg at bedtime   amLODipine 5 MG tablet Commonly known as: NORVASC Take 5 mg by mouth daily.   aspirin 81 MG  tablet Take 81 mg by mouth  daily.   atorvastatin 80 MG tablet Commonly known as: LIPITOR Take 1 tablet (80 mg total) by mouth daily.   CENTRUM ADULTS PO Take 1 tablet by mouth daily.   CHELATED ZINC PO Take 1 capsule by mouth daily. May take 1 additional capsule in the evening   clopidogrel 75 MG tablet Commonly known as: PLAVIX Take 75 mg by mouth daily.   FISH OIL PO Take 1 capsule by mouth daily.   isosorbide mononitrate 30 MG 24 hr tablet Commonly known as: IMDUR Take 0.5 tablets (15 mg total) by mouth daily. Start taking on: December 31, 2022   mirtazapine 7.5 MG tablet Commonly known as: REMERON TAKE 1 TABLET(7.5 MG) BY MOUTH AT BEDTIME What changed: See the new instructions.   nitroGLYCERIN 0.4 MG SL tablet Commonly known as: Nitrostat Place 1 tablet (0.4 mg total) under the tongue every 5 (five) minutes as needed for chest pain (up to 3 doses. If taking 3rd dose, call 911).   sertraline 50 MG tablet Commonly known as: ZOLOFT TAKE 3 TABLETS(150 MG) BY MOUTH DAILY What changed: See the new instructions.           Outstanding Labs/Studies   If the patient is tolerating statin at time of follow-up appointment, would consider rechecking liver function/lipid panel in 6-8 weeks.   Duration of Discharge Encounter   Greater than 30 minutes including physician time.  Signed, Laurann Montana, PA-C 12/30/2022, 1:09 PM  Patient seen, examined. Available data reviewed. Agree with findings, assessment, and plan as outlined by Ronie Spies, PA-C.  The patient is independently interviewed and examined.  He is an alert, oriented, elderly male in no distress.  JVP is normal, lungs are clear bilaterally, heart is regular rate and rhythm with no murmur gallop, abdomen soft nontender, extremities have no edema, right radial cath site is clear.  I have personally reviewed his cardiac catheterization films.  He does have significant moderately severe calcific proximal LAD stenosis followed by a short  aneurysmal segment.  Attempts will be made to treat him medically due to his unfavorable anatomy for PCI.  Agree with the treatment plan as outlined above with addition of isosorbide, continuation of amlodipine, dual antiplatelet therapy with aspirin and clopidogrel, and high intensity statin drug.  If he develops recurrent angina, we will need to consider high risk PCI.  Reviewed all of this with the patient and his son who is present at the bedside today.  The patient has had no anginal symptoms now in about 48 hours.  Appears medically stable for discharge today.  Follow-up as outlined above.  Tonny Bollman, M.D. 12/30/2022 1:57 PM

## 2023-01-02 NOTE — Telephone Encounter (Signed)
TOC call. Patient has appointment, 1/23 at 10:55 with Laurann Montana, NP   Address reviewed and instructed to bring all medication bottles to appointment.

## 2023-01-06 MED FILL — Morphine Sulfate Inj 4 MG/ML: INTRAMUSCULAR | Qty: 1 | Status: AC

## 2023-01-10 ENCOUNTER — Ambulatory Visit (HOSPITAL_BASED_OUTPATIENT_CLINIC_OR_DEPARTMENT_OTHER): Payer: PPO | Admitting: Family

## 2023-01-10 ENCOUNTER — Ambulatory Visit (INDEPENDENT_AMBULATORY_CARE_PROVIDER_SITE_OTHER): Payer: PPO | Admitting: Psychiatry

## 2023-01-10 ENCOUNTER — Ambulatory Visit (INDEPENDENT_AMBULATORY_CARE_PROVIDER_SITE_OTHER): Payer: PPO | Admitting: Family

## 2023-01-10 ENCOUNTER — Encounter (HOSPITAL_BASED_OUTPATIENT_CLINIC_OR_DEPARTMENT_OTHER): Payer: Self-pay | Admitting: Family

## 2023-01-10 ENCOUNTER — Encounter: Payer: Self-pay | Admitting: Psychiatry

## 2023-01-10 VITALS — BP 122/62 | HR 71 | Ht 72.0 in | Wt 189.4 lb

## 2023-01-10 DIAGNOSIS — F5105 Insomnia due to other mental disorder: Secondary | ICD-10-CM

## 2023-01-10 DIAGNOSIS — I6523 Occlusion and stenosis of bilateral carotid arteries: Secondary | ICD-10-CM

## 2023-01-10 DIAGNOSIS — F39 Unspecified mood [affective] disorder: Secondary | ICD-10-CM

## 2023-01-10 DIAGNOSIS — I10 Essential (primary) hypertension: Secondary | ICD-10-CM

## 2023-01-10 DIAGNOSIS — E785 Hyperlipidemia, unspecified: Secondary | ICD-10-CM | POA: Diagnosis not present

## 2023-01-10 DIAGNOSIS — I7143 Infrarenal abdominal aortic aneurysm, without rupture: Secondary | ICD-10-CM

## 2023-01-10 DIAGNOSIS — R911 Solitary pulmonary nodule: Secondary | ICD-10-CM

## 2023-01-10 DIAGNOSIS — I25118 Atherosclerotic heart disease of native coronary artery with other forms of angina pectoris: Secondary | ICD-10-CM | POA: Diagnosis not present

## 2023-01-10 DIAGNOSIS — I493 Ventricular premature depolarization: Secondary | ICD-10-CM

## 2023-01-10 NOTE — Patient Instructions (Signed)
Medication Instructions:  Continue your current medications.   *If you need a refill on your cardiac medications before your next appointment, please call your pharmacy*   Lab Work/Testing/Procedures: None ordered today.    Follow-Up: At [facility], you and your health needs are our priority.  As part of our continuing mission to provide you with exceptional heart care, we have created designated Provider Care Teams.  These Care Teams include your primary Cardiologist (physician) and Advanced Practice Providers (APPs -  Physician Assistants and Nurse Practitioners) who all work together to provide you with the care you need, when you need it.  We recommend signing up for the patient portal called "MyChart".  Sign up information is provided on this After Visit Summary.  MyChart is used to connect with patients for Virtual Visits (Telemedicine).  Patients are able to view lab/test results, encounter notes, upcoming appointments, etc.  Non-urgent messages can be sent to your provider as well.   To learn more about what you can do with MyChart, go to [email or web address].    Your next appointment:   As scheduled with Dr. Percival Spanish in our [place] office  Other Instructions  For coronary artery disease often called "heart disease" we aim for optimal guideline directed medical therapy. We use the "A, B, C"s to help keep [place] on track!  A = Aspirin 81mg  daily B = Blood pressure control. C = Cholesterol control. You take Atorvastatin to help control your cholesterol.  D = Don't forget nitroglycerin! This is an emergency tablet to be used if you have chest pain. E = Extras. In your case, this is Plavix and Isosorbide (Imdur).

## 2023-01-10 NOTE — Progress Notes (Signed)
Gregory [name] [id number] 08-28-1934 89 y.o.  Subjective:   Patient ID:  Gregory [name] is a 89 y.o. (DOB 03/31/1934) male.  Chief Complaint:  Chief Complaint  Patient presents with   Follow-up    Bipolar II disorder ([place])    HPI Gregory [name] presents to the office today for follow-up of bipolar 2 depression and chronic insomnia with shift related sleep problems.  seen 07/30/2019 with the folloowing noted:  Seen with wife.  Forced into Covid. Retirement.  No major complaints re: sx.  Patient reports stable mood and denies depressed or irritable moods.  Patient denies any recent difficulty with anxiety.  Patient denies difficulty with sleep initiation or maintenance.  Goes to bed until 2-3 AM and up about 11 AM.  Denies appetite disturbance.  Patient reports that energy and motivation have been good.  Patient denies any difficulty with concentration.  Patient denies any suicidal ideation. Wife only concern is his sleep pattern.   He says he goes to sleep fine.  Plan no med changes  06/26/20 Lavonne Chick, spouse, called to report that Clance Boll decreased/changed how he is taking is medication.  She is not sure when he did this, but she now seeing signs that the change is affecting him.  He is becoming confused.  Last 2 nights couldn't mae out he words.  He is traveling with his son and has done some little parts in their program, but it hasn't good the last 2 nights.  He is repeating questions.  Sleeping a bit later in the mornings.  He claims he is fine: Not depressed.  But Marlowe Kays is seeing him decline slightly and would you to talk with him about how he is taking his medications and the affects changes could cause.  Has appt 8/19 but doesn't want to wait that long for someone to talk with him.  His cell is [phone].   MD response:The only thing he's taking from me is sertraline and mirtazapine and changes in either of those should not cause confusion.  I assume he's not overtaking the Xanax he  takes for sleep bc that can cause those problems especially in the morning.  I'm not prescribing that as I recall. I'm concerned he may have had another stroke.  They need to get him to the ER ASAP to be sure.  08/06/20 appt with the following noted: As far as I'm concerned I'm good.  Not depressed.  Concerned about world situation.   Had cut down sertraline from 150 to 100 mg and sons said he got confused but he didn't notice it.  That is cleared up now.  She says he's back to normal. Cut down on alprazolam at night to 0.5 mg HS.  And still taking mirtazapine 7.5 mg HS.  Still delayed sleep cycle.  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  Tired of doing nothing.    Plan: No med changes  04/01/2021 appointment with following noted: Doing well.  No major complaints.  No mania acknowledged.  Continues to be active in [place].  MRI and physical OK.  Tremor is still there intermittently.  One hand weaker. Thankful doing well.  Wife not doing as well as he'd like from the cancer.  She's dizzy.   What put me in depression both times was money but  Over it. Mirtazpaine helping sleep. Patient reports stable mood and denies depressed or irritable moods.  Patient denies any recent difficulty with anxiety.  Patient denies difficulty with sleep initiation  or maintenance. Denies appetite disturbance.  Patient reports that energy and motivation have been good.  Patient denies any difficulty with concentration.  Patient denies any suicidal ideation. Plan: No med changes.  Continue alprazolam 0.5 mg and mirtazapine 7.5 mg nightly for sleep. Continue sertraline 150 mg daily  10/14/2021 appointment with the following noted: No depression.  Patient reports stable mood and denies depressed or irritable moods.  Patient denies any recent difficulty with anxiety.  Patient denies difficulty with sleep initiation or maintenance. Denies appetite disturbance.  Patient reports that energy and motivation have been good.   Patient denies any difficulty with concentration.  Patient denies any suicidal ideation. Health good.   No SE.  Occ tremor.  No sig caffeine.   Consistent with sertraline 150 mg daily. Enjoys farm work and music.   Plan:   01/10/23 appt noted: Had 2 heart attacks since here.  Gregory [name] Eve and then in ER 12/23/22.  Had another episode.  Seen cardiologist.   Feels good.  Not depressed.  Don't even think about. Sleep is perfect.  With mirtazapine and alprazolam.  To bed 2 AM.   No SE.  Past Psychiatric Medication Trials: Depakote side effects, lithium tremor, Wellbutrin side effects, duloxetine, Pristiq, sertraline, Abilify lost response, buspirone, pramipexole 0.125, lamotrigine no response, Latuda 40, Rexulti, buspirone, methylphenidate, methyl folate, olanzapine, Lunesta, Ambien, Xanax, mirtazapine, trazodone, temazepam, propranolol Under my psychiatric care since 2005  Review of Systems:  Review of Systems  Cardiovascular:  Positive for chest pain. Negative for palpitations.  Neurological:  Positive for tremors. Negative for dizziness and weakness.    Medications: I have reviewed the patient's current medications.  Current Outpatient Medications  Medication Sig Dispense Refill   ALPRAZolam (XANAX) 0.5 MG tablet Take 0.5 mg by mouth every evening. Take 0.5 mg in the morning and 1 mg at bedtime     amLODipine (NORVASC) 5 MG tablet Take 5 mg by mouth daily.     aspirin 81 MG tablet Take 81 mg by mouth daily.     atorvastatin (LIPITOR) 80 MG tablet Take 1 tablet (80 mg total) by mouth daily. 90 tablet 3   CHELATED ZINC PO Take 1 capsule by mouth daily. May take 1 additional capsule in the evening     clopidogrel (PLAVIX) 75 MG tablet Take 75 mg by mouth daily.     isosorbide mononitrate (IMDUR) 30 MG 24 hr tablet Take 0.5 tablets (15 mg total) by mouth daily. 30 tablet 5   mirtazapine (REMERON) 7.5 MG tablet TAKE 1 TABLET(7.5 MG) BY MOUTH AT BEDTIME (Patient taking differently: Take 7.5  mg by mouth at bedtime.) 90 tablet 1   Multiple Vitamins-Minerals (CENTRUM ADULTS PO) Take 1 tablet by mouth daily.      nitroGLYCERIN (NITROSTAT) 0.4 MG SL tablet Place 1 tablet (0.4 mg total) under the tongue every 5 (five) minutes as needed for chest pain (up to 3 doses. If taking 3rd dose, call 911). 25 tablet 3   Omega-3 Fatty Acids (FISH OIL PO) Take 1 capsule by mouth daily.     sertraline (ZOLOFT) 50 MG tablet TAKE 3 TABLETS(150 MG) BY MOUTH DAILY (Patient taking differently: Take 150 mg by mouth daily.) 270 tablet 1   No current facility-administered medications for this visit.    Medication Side Effects: None  Allergies:  Allergies  Allergen Reactions   Bupropion Other (See Comments)    Unsure about alleries   Valproic Acid Other (See Comments)    Unsure about this  Past Medical History:  Diagnosis Date   Abdominal aneurysm [facility])    Depression    History of kidney stones    Hyperlipidemia    Hypertension    Stroke [facility]) 2004?   TIA    Family History  Problem Relation Age of Onset   Cancer Mother        breast   Heart disease Mother    Heart disease Brother    Tremor Neg Hx     Social History   Socioeconomic History   Marital status: Married    Spouse name: Not on file   Number of children: 2   Years of education: Not on file   Highest education level: Some college, no degree  Occupational History   Not on file  Tobacco Use   Smoking status: Former   Smokeless tobacco: Never  Building services engineer Use: Never used  Substance and Sexual Activity   Alcohol use: No   Drug use: No   Sexual activity: Not on file  Other Topics Concern   Not on file  Social History Narrative   Lives at home w/ his wife   Right-handed   Caffeine: occasional coffee and tea, decaf tea at home   Social Determinants of Health   Financial Resource Strain: Not on file  Food Insecurity: Not on file  Transportation Needs: Not on file  Physical Activity: Not on file   Stress: Not on file  Social Connections: Not on file  Intimate Partner Violence: Not on file    Past Medical History, Surgical history, Social history, and Family history were reviewed and updated as appropriate.   Please see review of systems for further details on the patient's review from today.   Objective:   Physical Exam:  There were no vitals taken for this visit.  Physical Exam Constitutional:      General: He is not in acute distress.    Appearance: He is well-developed.  Musculoskeletal:        General: No deformity.  Neurological:     Mental Status: He is alert and oriented to person, place, and time.     Coordination: Coordination normal.  Psychiatric:        Attention and Perception: Attention and perception normal. He does not perceive auditory or visual hallucinations.        Mood and Affect: Mood normal. Mood is not anxious or depressed. Affect is not labile, blunt, angry or inappropriate.        Speech: Speech normal. Speech is not rapid and pressured or slurred.        Behavior: Behavior normal. Behavior is not slowed.        Thought Content: Thought content normal. Thought content is not paranoid or delusional. Thought content does not include homicidal or suicidal ideation. Thought content does not include suicidal plan.        Cognition and Memory: Cognition and memory normal.        Judgment: Judgment normal.     Comments: Insight intact Good humor.  Mild forgetfulness.  Mild word finding issues     Lab Review:     Component Value Date/Time   NA 136 12/30/2022 0110   NA 142 01/02/2018 1456   K 3.8 12/30/2022 0110   CL 106 12/30/2022 0110   CO2 22 12/30/2022 0110   GLUCOSE 92 12/30/2022 0110   BUN 18 12/30/2022 0110   BUN 11 01/02/2018 1456   CREATININE 1.29 (H) 12/30/2022 0110  CALCIUM 8.9 12/30/2022 0110   PROT 7.0 02/27/2018 1542   ALBUMIN 4.1 02/27/2018 1542   AST 29 02/27/2018 1542   ALT 25 02/27/2018 1542   ALKPHOS 64 02/27/2018 1542    BILITOT 1.0 02/27/2018 1542   GFRNONAA 54 (L) 12/30/2022 0110   GFRAA >60 02/27/2018 1542       Component Value Date/Time   WBC 7.0 12/30/2022 0110   RBC 3.70 (L) 12/30/2022 0110   HGB 11.5 (L) 12/30/2022 0110   HCT 34.7 (L) 12/30/2022 0110   PLT 170 12/30/2022 0110   MCV 93.8 12/30/2022 0110   MCH 31.1 12/30/2022 0110   MCHC 33.1 12/30/2022 0110   RDW 12.8 12/30/2022 0110   LYMPHSABS 1.4 12/24/2022 1313   MONOABS 0.5 12/24/2022 1313   EOSABS 0.2 12/24/2022 1313   BASOSABS 0.0 12/24/2022 1313    No results found for: "POCLITH", "LITHIUM"   No results found for: "PHENYTOIN", "PHENOBARB", "VALPROATE", "CBMZ"   .res Assessment: Plan:    Hyrum was seen today for follow-up.  Diagnoses and all orders for this visit:  Episodic mood disorder ([place])  Insomnia due to mental condition    Greater than 50% of face to face time with patient was spent on counseling and coordination of care. We discussed Mr. Mogg has not had any mood symptoms in the past several months.  He has had repeated bouts of depression with occasional of hypomania some of which have lasted months.  He has not done well on mood stabilizers as they have been poorly tolerated.  We have discussed the very significant risk of mood instability mood and mood swings since he is not taking a mood stabilizer.  He is willing to take that risk at this time.  He states he will notify me if he starts having hypomania or depression again.  He is aware of the risks that antidepressants can cause mood swings in bipolar patients but he has not been able to get off of the antidepressant without recurrence of depression.  Unfortunately he does not tend to recognize his hypomania very well but the family does recognize it.  he remained stable.  He has a delayed sleep phase but it is not associated with any significant disability.  There is significant concern that he has had a hypomanic episodes in the past some of which have been  reasonably prolonged and he is not on a mood stabilizer.  This has been discussed with he and his wife extensively in previouus appts.  Unfortunately he has not tolerated any of the mood stabilizers that he has taken.  They are aware of the risk of future hypomania.  Fortunately he has not had severe manic episodes but his hypomanic episodes have been to disruptive at times.  He seems stable now.  He has not been able to stop antidepressants without depression recurring and has no interest in stopping antidepressants because of this.  The disability has been far worse from depression in the past and it has been from hypomania.  Tremor stable but not gone  No med changes indicated doing very well.  Continue alprazolam 0.5 mg and mirtazapine 7.5 mg nightly for sleep. Continue sertraline 150 mg daily  Follow-up  6 months or sooner as needed  Lynder Parents MD, DFAPA  Please see After Visit Summary for patient specific instructions.  Future Appointments  Date Time Provider [place]  03/01/2023  2:40 PM Minus Breeding, MD [facility]    No orders of the defined  types were placed in this encounter.   -------------------------------

## 2023-01-10 NOTE — Progress Notes (Signed)
Office Visit    Patient Name: Gregory [name] Date of Encounter: 01/15/2023  PCP:  Gregory Banner, MD   [facility]  Cardiologist:  Gregory Rotunda, MD  Advanced Practice Provider:  No care team member to display Electrophysiologist:  None      Chief Complaint    Gregory [name] is a 89 y.o. male presents today for hospital follow up   Past Medical History    Past Medical History:  Diagnosis Date   Abdominal aneurysm [facility])    Depression    History of kidney stones    Hyperlipidemia    Hypertension    Stroke [facility]) 2004?   TIA   Past Surgical History:  Procedure Laterality Date   COLON SURGERY     polyps removed   COLONOSCOPY     CORONARY ANGIOGRAPHY N/A 12/29/2022   Procedure: CORONARY ANGIOGRAPHY;  Surgeon: Gregory Gess, MD;  Location: [facility] INVASIVE CV LAB;  Service: Cardiovascular;  Laterality: N/A;   EYE SURGERY     cataract right   IR ANGIO INTRA EXTRACRAN SEL COM CAROTID INNOMINATE BILAT MOD SED  02/28/2018   IR ANGIO VERTEBRAL SEL SUBCLAVIAN INNOMINATE UNI R MOD SED  02/28/2018   IR RADIOLOGIST EVAL & MGMT  02/12/2021   LEFT HEART CATH AND CORONARY ANGIOGRAPHY N/A 12/26/2022   Procedure: LEFT HEART CATH AND CORONARY ANGIOGRAPHY;  Surgeon: Gregory Gess, MD;  Location: [facility] INVASIVE CV LAB;  Service: Cardiovascular;  Laterality: N/A;   OTHER SURGICAL HISTORY     Carotid    RADIOLOGY WITH ANESTHESIA N/A 02/28/2018   Procedure: STENTING;  Surgeon: Gregory Cotton, MD;  Location: [facility] OR;  Service: Radiology;  Laterality: N/A;    Allergies  Allergies  Allergen Reactions   Bupropion Other (See Comments)    Unsure about alleries   Valproic Acid Other (See Comments)    Unsure about this    History of Present Illness    Gregory [name] is a 89 y.o. 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  Admitted for NSTEMI 12/23/22 discharged on 1/8. Workup at that time CTA negative PE but noted coronary calcifications and stable infrarenal AAA. Echo LVEF 50-55%, mild hypokinesis of the anterior and anterolateral wall, normal RV, trivial MR, mildly dilated aortic root measuring 48mm. LHC 12/26/22 50% distal LCx, 50% prox-mid LAD. Medical therapy recommended. He was discharged on 12/26/22. Returned 12/28/22 with recurrent NSTEMI. Repeat LHC prox-mid LAD 50%, dist Cx 50%, prox LAD 80% with prox LAD lesion just prior to aneurysmal segment not ideal for percutaneous therapy. Noted if recurrent symptoms consider alternate revascularization such as CABG (LIMA-LAD). He was discharged on Amlodipine and Imdur.  Presents today for follow up with his son. He enjoys singing in his church group as well as with his family. He has been walking 5 minutes per day at home without chest pain or dyspnea. He has a goal to gradually increase.   Reports no shortness of breath nor dyspnea on exertion. Reports no chest pain, pressure, or tightness. No edema, orthopnea, PND. Reports no palpitations.     EKGs/Labs/Other Studies Reviewed:   The following studies were reviewed today:  Cath 12/29/22   Prox LAD to Mid LAD lesion is 50% stenosed.   Dist Cx lesion is 50% stenosed.   Prox LAD lesion is  80% stenosed.  IMPRESSION: [name] was readmitted with a non-STEMI several days after discharge from a recent admission for a similar presentation at which time heart catheterization performed by myself on 12/26/2022 showed what I felt was nonobstructive disease with diffuse calcified vessels.  Heart cath today shows similar anatomy although focusing on the proximal LAD there does not seem to be a 75 to 80% lesion at a septal perforator just before an aneurysmal segment which is not ideal for percutaneous intervention.  I favor continued medical therapy.  The sheath was removed and a TR band was  placed on the right wrist to achieve patent hemostasis.  The patient is already on DAPT with aspirin and clopidogrel.  I will restart heparin in 4 hours at which we will continue for 48 hours after which she will be discharged home.  If he has recurrent symptoms we may have to consider alternative revascularization strategies such as CABG (LIMA to LAD). _________________________________________________-  Echo  12/24/22  1. Left ventricular ejection fraction, by estimation, is 50 to 55%. The  left ventricle has low normal function. The left ventricle demonstrates  regional wall motion abnormalities (see scoring diagram/findings for  description). There is mild concentric  left ventricular hypertrophy. Left ventricular diastolic parameters are  indeterminate. There is mild hypokinesis of the left ventricular, entire  anterolateral wall and anterior wall.   2. Right ventricular systolic function is normal. The right ventricular  size is normal. There is normal pulmonary artery systolic pressure.   3. The mitral valve is grossly normal. Trivial mitral valve  regurgitation. No evidence of mitral stenosis.   4. The aortic valve is calcified. There is moderate calcification of the  aortic valve. There is moderate thickening of the aortic valve. Aortic  valve regurgitation is not visualized. Aortic valve  sclerosis/calcification is present, without any evidence  of aortic stenosis.   5. Aortic dilatation noted. There is mild dilatation of the aortic root,  measuring 42 mm.   6. The inferior vena cava is normal in size with greater than 50%  respiratory variability, suggesting right atrial pressure of 3 mmHg.  Comparison(s): No prior Echocardiogram  Conclusion(s)/Recommendation(s): Low normal LVEF. With echo contrast,  parasternal wall motion appears largely normal, but on apical images there  may be mild hypokinesis of the anterior and anterolateral wall.   EKG:  EKG is not ordered today.    Recent Labs: 12/24/2022: B Natriuretic Peptide 326.2 12/30/2022: BUN 18; Creatinine, Ser 1.29; Hemoglobin 11.5; Platelets 170; Potassium 3.8; Sodium 136  Recent Lipid Panel    Component Value Date/Time   CHOL 120 12/25/2022 0123   TRIG 102 12/25/2022 0123   HDL 37 (L) 12/25/2022 0123   CHOLHDL 3.2 12/25/2022 0123   VLDL 20 12/25/2022 0123   LDLCALC 63 12/25/2022 0123     Home Medications   No outpatient medications have been marked as taking for the 01/10/23 encounter (Office Visit) with Loel Dubonnet, NP.     Review of Systems      All other systems reviewed and are otherwise negative except as noted above.  Physical Exam    VS:  BP 122/62   Pulse 71   Ht 6' (1.829 m)   Wt 189 lb 6.4 oz (85.9 kg)   SpO2 93%   BMI 25.69 kg/m  , BMI Body mass index is 25.69 kg/m.  Wt Readings from Last 3 Encounters:  01/10/23 189 lb 6.4 oz (85.9 kg)  12/30/22 188 lb (85.3 kg)  12/26/22 185 lb (83.9 kg)     GEN: Well nourished, well developed, in no acute distress. HEENT: normal. Neck: Supple, no JVD, carotid bruits, or masses. Cardiac: RRR, no murmurs, rubs, or gallops. No clubbing, cyanosis, edema.  Radials/PT 2+ and equal bilaterally.  Respiratory:  Respirations regular and unlabored, clear to auscultation bilaterally. GI: Soft, nontender, nondistended. MS: No deformity or atrophy. Skin: Warm and dry, no rash. Neuro:  Strength and sensation are intact. Psych: Normal affect.  Assessment & Plan    NSTEMI / CAD / HLD, LDL goal <70 - Stable with no anginal symptoms. GDMT Aspirin, Plavix, Atorvastatin, Imdur, Amlodipine. Heart healthy diet and regular cardiovascular exercise encouraged.  Encouraged to participate in cardiac rehab.  12/25/21 LDL 63. Continue Atorvastatin 80mg  QD.   HTN - BP well controlled. Continue current antihypertensive regimen.    PVC - Flecainide discontinued during admission due to CAD. No recurrent palpitations.   Lung nodule - CT 12/23/22 26mm  anterolateral RUL nonalcified lung nodule. Recommended for 3 month f/u with CT or PET-CT or tissue sampling. Will follow with PCP.   Hyperglycermia - 12/2022 A1c 6.5. No hx of diabetes. Continue to follow with PCP.   CKD IIIa - Stable kidney function. Normal electrolytes. Good result!   AAA (CT 12/23/22 measurement 4x3.6cm)  - 12/23/22 aorta 4.0cmx3.6cm dilation infrarenal abdominal aorta. Continue optimal BP control - consider repeat CT vs AAA duplex in 1 year for monitoring.   Carotid stenosis - 02/10/21 bilateral ICA 1-39% stenosis. No syncope, amaurosis fugax. Continue DAPT Aspirin/Plavix and Atorvastatin.      Cardiac Rehabilitation Eligibility Assessment  The patient is ready to start cardiac rehabilitation from a cardiac standpoint.   Disposition: Follow up in 3 month(s) with Minus Breeding, MD in [place] office per patient preference.   Signed, Loel Dubonnet, NP 01/15/2023, 8:24 PM [place]

## 2023-01-23 ENCOUNTER — Other Ambulatory Visit: Payer: Self-pay | Admitting: Psychiatry

## 2023-01-23 DIAGNOSIS — F3181 Bipolar II disorder: Secondary | ICD-10-CM

## 2023-01-24 ENCOUNTER — Ambulatory Visit (HOSPITAL_BASED_OUTPATIENT_CLINIC_OR_DEPARTMENT_OTHER): Payer: 59 | Admitting: Family

## 2023-02-08 ENCOUNTER — Telehealth (HOSPITAL_COMMUNITY): Payer: Self-pay

## 2023-02-08 ENCOUNTER — Encounter (HOSPITAL_COMMUNITY): Payer: Self-pay

## 2023-02-08 NOTE — Telephone Encounter (Signed)
Attempted to call patient in regards to Cardiac Rehab - LM on VM  Mailed letter 

## 2023-02-28 DIAGNOSIS — E785 Hyperlipidemia, unspecified: Secondary | ICD-10-CM | POA: Insufficient documentation

## 2023-02-28 NOTE — Progress Notes (Deleted)
Cardiology Office Note   Date:  02/28/2023   ID:  Gregory [name], DOB 13-Nov-1934, MRN [id number]  PCP:  [name], MD  Cardiologist:   Minus Breeding, MD Referring:  ***  No chief complaint on file.     History of Present Illness: Gregory [name] is a 89 y.o. 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   He was admitted for NSTEMI 12/23/22 discharged on 1/8. Workup at that time CTA negative PE but noted coronary calcifications and stable infrarenal AAA. Echo LVEF 50-55%, mild hypokinesis of the anterior and anterolateral wall, normal RV, trivial MR, mildly dilated aortic root measuring 15m. LHC 12/26/22 50% distal LCx, 50% prox-mid LAD. Medical therapy recommended. He was discharged on 12/26/22. Returned 12/28/22 with recurrent NSTEMI. Repeat LHC prox-mid LAD 50%, dist Cx 50%, prox LAD 80% with prox LAD lesion just prior to aneurysmal segment not ideal for percutaneous therapy. Noted if recurrent symptoms consider alternate revascularization such as CABG (LIMA-LAD). He was discharged on Amlodipine and Imdur.   He presents for follow up.  ***   *** Presents today for follow up with his son. He enjoys singing in his church group as well as with his family. He has been walking 5 minutes per day at home without chest pain or dyspnea. He has a goal to gradually increase.    Reports no shortness of breath nor dyspnea on exertion. Reports no chest pain, pressure, or tightness. No edema, orthopnea, PND. Reports no palpitations.     Past Medical History:  Diagnosis Date   Abdominal aneurysm (Gregory [name]    Depression    History of kidney stones    Hyperlipidemia    Hypertension    Stroke (Gregory [name] 2004?   TIA    Past Surgical History:  Procedure Laterality Date   COLON SURGERY     polyps removed   COLONOSCOPY      CORONARY ANGIOGRAPHY N/A 12/29/2022   Procedure: CORONARY ANGIOGRAPHY;  Surgeon: BLorretta Harp MD;  [name];  Service: Cardiovascular;  Laterality: N/A;   EYE SURGERY     cataract right   IR ANGIO INTRA EXTRACRAN SEL COM CAROTID INNOMINATE BILAT MOD SED  02/28/2018   IR ANGIO VERTEBRAL SEL SUBCLAVIAN INNOMINATE UNI R MOD SED  02/28/2018   IR RADIOLOGIST EVAL & MGMT  02/12/2021   LEFT HEART CATH AND CORONARY ANGIOGRAPHY N/A 12/26/2022   Procedure: LEFT HEART CATH AND CORONARY ANGIOGRAPHY;  Surgeon: BLorretta Harp MD;  [name];  Service: Cardiovascular;  Laterality: N/A;   OTHER SURGICAL HISTORY     Carotid    RADIOLOGY WITH ANESTHESIA N/A 02/28/2018   Procedure: STENTING;  Surgeon: DLuanne Bras MD;  [name]  Service: Radiology;  Laterality: N/A;     Current Outpatient Medications  Medication Sig Dispense Refill   ALPRAZolam (XANAX) 0.5 MG tablet Take 0.5 mg by mouth every evening. Take 0.5 mg in the morning and 1 mg at bedtime     amLODipine (NORVASC) 5 MG tablet Take 5 mg by mouth daily.     aspirin 81 MG tablet Take 81 mg by mouth daily.     atorvastatin (LIPITOR) 80 MG tablet Take 1 tablet (80 mg total) by mouth daily. 90 tablet 3   CHELATED ZINC PO Take 1 capsule  by mouth daily. May take 1 additional capsule in the evening     clopidogrel (PLAVIX) 75 MG tablet Take 75 mg by mouth daily.     isosorbide mononitrate (IMDUR) 30 MG 24 hr tablet Take 0.5 tablets (15 mg total) by mouth daily. 30 tablet 5   mirtazapine (REMERON) 7.5 MG tablet TAKE 1 TABLET(7.5 MG) BY MOUTH AT BEDTIME (Patient taking differently: Take 7.5 mg by mouth at bedtime.) 90 tablet 1   Multiple Vitamins-Minerals (CENTRUM ADULTS PO) Take 1 tablet by mouth daily.      nitroGLYCERIN (NITROSTAT) 0.4 MG SL tablet Place 1 tablet (0.4 mg total) under the tongue every 5 (five) minutes as needed for chest pain (up to 3 doses. If taking 3rd dose, call 911). 25 tablet 3    Omega-3 Fatty Acids (FISH OIL PO) Take 1 capsule by mouth daily.     sertraline (ZOLOFT) 50 MG tablet TAKE 3 TABLETS(150 MG) BY MOUTH DAILY (Patient taking differently: Take 150 mg by mouth daily.) 270 tablet 1   No current facility-administered medications for this visit.    Allergies:   Bupropion and Valproic acid    ROS:  Please see the history of present illness.   Otherwise, review of systems are positive for {NONE DEFAULTED:18576}.   All other systems are reviewed and negative.    PHYSICAL EXAM: VS:  There were no vitals taken for this visit. , BMI There is no height or weight on file to calculate BMI. GENERAL:  Well appearing NECK:  No jugular venous distention, waveform within normal limits, carotid upstroke brisk and symmetric, no bruits, no thyromegaly LUNGS:  Clear to auscultation bilaterally BACK:  No CVA tenderness CHEST:  Unremarkable HEART:  PMI not displaced or sustained,S1 and S2 within normal limits, no S3, no S4, no clicks, no rubs, *** murmurs ABD:  Flat, positive bowel sounds normal in frequency in pitch, no bruits, no rebound, no guarding, no midline pulsatile mass, no hepatomegaly, no splenomegaly EXT:  2 plus pulses throughout, no edema, no cyanosis no clubbing   EKG:  EKG {ACTION; IS/IS [id number] ordered today. The ekg ordered today demonstrates ***  CATH:    Diagnostic Dominance: Right    Recent Labs: 12/24/2022: B Natriuretic Peptide 326.2 12/30/2022: BUN 18; Creatinine, Ser 1.29; Hemoglobin 11.5; Platelets 170; Potassium 3.8; Sodium 136    Lipid Panel    Component Value Date/Time   CHOL 120 12/25/2022 0123   TRIG 102 12/25/2022 0123   HDL 37 (L) 12/25/2022 0123   CHOLHDL 3.2 12/25/2022 0123   VLDL 20 12/25/2022 0123   LDLCALC 63 12/25/2022 0123      Wt Readings from Last 3 Encounters:  01/10/23 189 lb 6.4 oz (85.9 kg)  12/30/22 188 lb (85.3 kg)  12/26/22 185 lb (83.9 kg)      Other studies Reviewed: Additional studies/ records  that were reviewed today include: ***. Review of the above records demonstrates:  Please see elsewhere in the note.  ***   ASSESSMENT AND PLAN:   NSTEMI / CAD :  ***   HLD, LDL goal <70 - Stable with no anginal symptoms. GDMT Aspirin, Plavix, Atorvastatin, Imdur, Amlodipine. Heart healthy diet and regular cardiovascular exercise encouraged.  Encouraged to participate in cardiac rehab.  12/25/21 LDL 63. Continue Atorvastatin '80mg'$  QD.    HTN :  ***  BP well controlled. Continue current antihypertensive regimen.     PVC :  *** Flecainide discontinued during admission due to CAD. No recurrent palpitations.  Lung nodule :  ***  CT 12/23/22 38m anterolateral RUL nonalcified lung nodule. Recommended for 3 month f/u with CT or PET-CT or tissue sampling. Will follow with PCP.    Hyperglycermia :  *** - 12/2022 A1c 6.5. No hx of diabetes. Continue to follow with PCP.    CKD IIIa:  ***  - Stable kidney function. Normal electrolytes. Good result!    AAA (CT 12/23/22 measurement 4x3.6cm):   ***   - 12/23/22 aorta 4.0cmx3.6cm dilation infrarenal abdominal aorta. Continue optimal BP control - consider repeat CT vs AAA duplex in 1 year for monitoring.    Carotid stenosis :  *** - 02/10/21 bilateral ICA 1-39% stenosis. No syncope, amaurosis fugax. Continue DAPT Aspirin/Plavix and Atorvastatin.      Current medicines are reviewed at length with the patient today.  The patient {ACTIONS; HAS/DOES NOT HAVE:19233} concerns regarding medicines.  The following changes have been made:  {PLAN; NO CHANGE:13088:s}  Labs/ tests ordered today include: *** No orders of the defined types were placed in this encounter.    Disposition:   FU with ***    Signed, JMinus Breeding MD  02/28/2023 9:22 PM    [facility]

## 2023-03-01 ENCOUNTER — Ambulatory Visit (INDEPENDENT_AMBULATORY_CARE_PROVIDER_SITE_OTHER): Payer: PPO | Admitting: Cardiology

## 2023-03-01 ENCOUNTER — Encounter: Payer: Self-pay | Admitting: Cardiology

## 2023-03-01 ENCOUNTER — Ambulatory Visit: Payer: PPO | Admitting: Cardiology

## 2023-03-01 VITALS — BP 92/62 | HR 64 | Ht 72.0 in | Wt 187.0 lb

## 2023-03-01 DIAGNOSIS — I214 Non-ST elevation (NSTEMI) myocardial infarction: Secondary | ICD-10-CM

## 2023-03-01 DIAGNOSIS — E785 Hyperlipidemia, unspecified: Secondary | ICD-10-CM

## 2023-03-01 DIAGNOSIS — R911 Solitary pulmonary nodule: Secondary | ICD-10-CM

## 2023-03-01 DIAGNOSIS — I10 Essential (primary) hypertension: Secondary | ICD-10-CM

## 2023-03-01 DIAGNOSIS — I25118 Atherosclerotic heart disease of native coronary artery with other forms of angina pectoris: Secondary | ICD-10-CM

## 2023-03-01 NOTE — Patient Instructions (Signed)
Medication Instructions:  The current medical regimen is effective;  continue present plan and medications.  *If you need a refill on your cardiac medications before your next appointment, please call your pharmacy*   Testing/Procedures: Non-Cardiac CT scanning, (CAT scanning), is a noninvasive, special x-ray that produces cross-sectional images of the body using x-rays and a computer. CT scans help physicians diagnose and treat medical conditions. For some CT exams, a contrast material is used to enhance visibility in the area of the body being studied. CT scans provide greater clarity and reveal more details than regular x-ray exams. You will be contacted to be scheduled for this appointment (to be completed at [facility])   Follow-Up: At [facility], you and your health needs are our priority.  As part of our continuing mission to provide you with exceptional heart care, we have created designated Provider Care Teams.  These Care Teams include your primary Cardiologist (physician) and Advanced Practice Providers (APPs -  Physician Assistants and Nurse Practitioners) who all work together to provide you with the care you need, when you need it.  We recommend signing up for the patient portal called "MyChart".  Sign up information is provided on this After Visit Summary.  MyChart is used to connect with patients for Virtual Visits (Telemedicine).  Patients are able to view lab/test results, encounter notes, upcoming appointments, etc.  Non-urgent messages can be sent to your provider as well.   To learn more about what you can do with MyChart, go to [email or web address].    Your next appointment:   6 month(s)  Provider:   Minus Breeding, MD

## 2023-03-01 NOTE — Progress Notes (Signed)
Cardiology Office Note   Date:  03/01/2023   ID:  Gregory [name], DOB 05/04/1934, MRN [id number]  PCP:  Gregory Sacramento, MD  Cardiologist:   Minus Breeding, MD   Chief Complaint  Patient presents with   Chest Pain      History of Present Illness: Gregory [name] is a 89 y.o. 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   He was admitted for NSTEMI 12/23/22 discharged on 1/8. Workup at that time CTA negative PE but noted coronary calcifications and stable infrarenal AAA. Echo LVEF 50-55%, mild hypokinesis of the anterior and anterolateral wall, normal RV, trivial MR, mildly dilated aortic root measuring 85m.  He had recurrent symptoms and had a second LHC 12/26/22 50% distal LCx, 50% prox-mid LAD. Medical therapy recommended. He was discharged on 12/26/22. Returned 12/28/22 with recurrent NSTEMI. Repeat LHC prox-mid LAD 50%, dist Cx 50%, prox LAD 80% with prox LAD lesion just prior to aneurysmal segment not ideal for percutaneous therapy. Noted if recurrent symptoms consider alternate revascularization such as CABG (LIMA-LAD). He was discharged on Amlodipine and Imdur.   He presents for follow up.  Since going home on medications he is actually doing well.  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  He has not been having any palpitations, presyncope or syncope.  He is having no weight gain or edema.   Past Medical History:  Diagnosis Date   Abdominal aneurysm ([facility]    Depression    History of kidney stones    Hyperlipidemia    Hypertension    Stroke ([facility] 2004?   TIA    Past Surgical History:  Procedure Laterality Date   COLON SURGERY     polyps removed   COLONOSCOPY     CORONARY ANGIOGRAPHY N/A 12/29/2022   Procedure:  CORONARY ANGIOGRAPHY;  Surgeon: BLorretta Harp MD;  [facility];  Service: Cardiovascular;  Laterality: N/A;   EYE SURGERY     cataract right   IR ANGIO INTRA EXTRACRAN SEL COM CAROTID INNOMINATE BILAT MOD SED  02/28/2018   IR ANGIO VERTEBRAL SEL SUBCLAVIAN INNOMINATE UNI R MOD SED  02/28/2018   IR RADIOLOGIST EVAL & MGMT  02/12/2021   LEFT HEART CATH AND CORONARY ANGIOGRAPHY N/A 12/26/2022   Procedure: LEFT HEART CATH AND CORONARY ANGIOGRAPHY;  Surgeon: BLorretta Harp MD;  [facility];  Service: Cardiovascular;  Laterality: N/A;   OTHER SURGICAL HISTORY     Carotid    RADIOLOGY WITH ANESTHESIA N/A 02/28/2018   Procedure: STENTING;  Surgeon: DLuanne Bras MD;  [facility]  Service: Radiology;  Laterality: N/A;     Current Outpatient Medications  Medication Sig Dispense Refill   ALPRAZolam (XANAX) 0.5 MG tablet Take 0.5 mg by mouth every evening. Take 0.5 mg in the morning and 1 mg at bedtime     amLODipine (NORVASC) 5 MG tablet Take 5 mg by mouth daily.     aspirin 81 MG tablet Take 81 mg by mouth daily.     atorvastatin (LIPITOR) 80 MG tablet Take 1 tablet (80 mg total) by mouth daily. 90 tablet 3   CHELATED ZINC PO Take 1 capsule by mouth daily. May take 1 additional capsule in  the evening     clopidogrel (PLAVIX) 75 MG tablet Take 75 mg by mouth daily.     isosorbide mononitrate (IMDUR) 30 MG 24 hr tablet Take 0.5 tablets (15 mg total) by mouth daily. 30 tablet 5   mirtazapine (REMERON) 7.5 MG tablet TAKE 1 TABLET(7.5 MG) BY MOUTH AT BEDTIME (Patient taking differently: Take 7.5 mg by mouth at bedtime.) 90 tablet 1   Multiple Vitamins-Minerals (CENTRUM ADULTS PO) Take 1 tablet by mouth daily.      nitroGLYCERIN (NITROSTAT) 0.4 MG SL tablet Place 1 tablet (0.4 mg total) under the tongue every 5 (five) minutes as needed for chest pain (up to 3 doses. If taking 3rd dose, call 911). 25 tablet 3   Omega-3 Fatty Acids (FISH OIL PO) Take 1 capsule  by mouth daily.     sertraline (ZOLOFT) 50 MG tablet TAKE 3 TABLETS(150 MG) BY MOUTH DAILY (Patient taking differently: Take 150 mg by mouth daily.) 270 tablet 1   No current facility-administered medications for this visit.    Allergies:   Bupropion and Valproic acid    ROS:  Please see the history of present illness.   Otherwise, review of systems are positive for none.   All other systems are reviewed and negative.    PHYSICAL EXAM: VS:  BP 92/62   Pulse 64   Ht 6' (1.829 m)   Wt 187 lb (84.8 kg)   BMI 25.36 kg/m  , BMI Body mass index is 25.36 kg/m. GENERAL:  Well appearing NECK:  No jugular venous distention, waveform within normal limits, carotid upstroke brisk and symmetric, no bruits, no thyromegaly LUNGS:  Clear to auscultation bilaterally BACK:  No CVA tenderness CHEST:  Unremarkable HEART:  PMI not displaced or sustained,S1 and S2 within normal limits, no S3, no S4, no clicks, no rubs, 2 out of 6 apical systolic murmur radiating slightly at the aortic outflow tract murmurs ABD:  Flat, positive bowel sounds normal in frequency in pitch, no bruits, no rebound, no guarding, no midline pulsatile mass, no hepatomegaly, no splenomegaly EXT:  2 plus pulses throughout, no edema, no cyanosis no clubbing   EKG:  EKG is not ordered today.   CATH:    Diagnostic Dominance: Right    Recent Labs: 12/24/2022: B Natriuretic Peptide 326.2 12/30/2022: BUN 18; Creatinine, Ser 1.29; Hemoglobin 11.5; Platelets 170; Potassium 3.8; Sodium 136    Lipid Panel    Component Value Date/Time   CHOL 120 12/25/2022 0123   TRIG 102 12/25/2022 0123   HDL 37 (L) 12/25/2022 0123   CHOLHDL 3.2 12/25/2022 0123   VLDL 20 12/25/2022 0123   LDLCALC 63 12/25/2022 0123      Wt Readings from Last 3 Encounters:  03/01/23 187 lb (84.8 kg)  01/10/23 189 lb 6.4 oz (85.9 kg)  12/30/22 188 lb (85.3 kg)      Other studies Reviewed: Additional studies/ records that were reviewed today include:  Hospital records. Review of the above records demonstrates:  Please see elsewhere in the note.     ASSESSMENT AND PLAN:   NSTEMI / CAD :    He had a long discussion today again about the catheterization and medical management.  He is fine for not having any revascularization given the fact that he is not having symptoms.  He would let me know if he has any recurrent symptoms.   HTN : Blood pressure is running low.  No change in therapy.    Low blood pressure precludes med  titration.     PVC : Flecainide was discontinued at the time of his admission secondary to his known coronary disease.  His PVCs are not bothering him.  No change in therapy.    Lung nodule : I will follow-up with a noncontrasted CT of his chest.    Hyperglycermia : A1c is 6.5.  Management per Gregory Sacramento, MD   CKD IIIa: Creatinine was 1.29 and I will follow this clinically.   AAA (CT 12/23/22 measurement 4x3.6cm):   This was 4.0 x 3.6 cm in January and I will follow-up in 1 year.  Carotid stenosis : He had some very mild plaque.  Follow this clinically and with risk reduction.    Current medicines are reviewed at length with the patient today.  The patient does not have concerns regarding medicines.  The following changes have been made:  no change  Labs/ tests ordered today include:   Orders Placed This Encounter  Procedures   CT CHEST NODULE FOLLOW UP LOW DOSE W/O     Disposition:   FU with me in six months.     Signed, Minus Breeding, MD  03/01/2023 4:33 PM    [place]

## 2023-03-20 ENCOUNTER — Ambulatory Visit (HOSPITAL_BASED_OUTPATIENT_CLINIC_OR_DEPARTMENT_OTHER)
Admission: RE | Admit: 2023-03-20 | Discharge: 2023-03-20 | Disposition: A | Discharge status: Home / Self Care | Payer: PPO | Source: Ambulatory Visit | Attending: Cardiology | Admitting: Cardiology

## 2023-03-20 DIAGNOSIS — R911 Solitary pulmonary nodule: Secondary | ICD-10-CM | POA: Diagnosis not present

## 2023-03-21 ENCOUNTER — Telehealth: Payer: Self-pay | Admitting: Cardiology

## 2023-03-21 NOTE — Telephone Encounter (Signed)
Received call from Opal Sidles at [facility] reporting critical CT results (in [place])  MPRESSION: 1. Interval enlargement of an aggressive appearing pulmonary nodule in the right upper lobe highly concerning for primary bronchogenic neoplasm. Additional imaging evaluation (such as PET-CT) and consultation with Pulmonology and/or thoracic Surgery recommended. 2. Aortic atherosclerosis, in addition to left main and three-vessel coronary artery disease. Please note that although the presence of coronary artery calcium documents the presence of coronary artery disease, the severity of this disease and any potential stenosis cannot be assessed on this non-gated CT examination. Assessment for potential risk factor modification, dietary therapy or pharmacologic therapy may be warranted, if clinically indicated. 3. There are calcifications of the aortic valve. Echocardiographic correlation for evaluation of potential valvular dysfunction may be warranted if clinically indicated. 4. Diffuse bronchial wall thickening with mild centrilobular and paraseptal emphysema; imaging findings suggestive of underlying COPD. 5. There also subtle changes concerning for early interstitial lung disease in the lung bases, as above. Nonemergent outpatient referral to Pulmonology for further clinical evaluation is suggested.   These results will be called to the ordering clinician or representative by the Radiologist Assistant, and communication documented in the PACS or [employer].   Aortic Atherosclerosis (ICD10-I70.0) and Emphysema (ICD10-J43.9).     Electronically Signed   By: Vinnie Langton M.D.   On: 03/21/2023 08:26

## 2023-03-21 NOTE — Telephone Encounter (Signed)
Caller is reporting out of range results 

## 2023-03-23 ENCOUNTER — Telehealth: Payer: Self-pay | Admitting: Cardiology

## 2023-03-23 NOTE — Telephone Encounter (Signed)
Dr. Redmond Pulling calling, he said, he is returning Dr. Rosezella Florida call from yesterday.

## 2023-04-03 ENCOUNTER — Institutional Professional Consult (permissible substitution): Payer: PPO | Admitting: Thoracic Surgery (Cardiothoracic Vascular Surgery)

## 2023-04-06 ENCOUNTER — Institutional Professional Consult (permissible substitution): Payer: PPO | Admitting: Thoracic Surgery (Cardiothoracic Vascular Surgery)

## 2023-04-06 ENCOUNTER — Other Ambulatory Visit: Payer: Self-pay | Admitting: Thoracic Surgery (Cardiothoracic Vascular Surgery)

## 2023-04-06 VITALS — BP 91/57 | HR 72 | Resp 20 | Ht 72.0 in | Wt 187.0 lb

## 2023-04-06 DIAGNOSIS — R911 Solitary pulmonary nodule: Secondary | ICD-10-CM

## 2023-04-06 NOTE — H&P (View-Only) (Signed)
PCP is Wilson, Fred H, MD Referring Provider is Hochrein, James, MD  Chief Complaint  Patient presents with   Lung Lesion    Surgical consult, Chest CT 03/20/23    HPI: Gregory [name] sent for consultation regarding a right upper lobe lung nodule.  Gregory [name] is an 89-year-old man with a history of remote tobacco use, hypertension, hyperlipidemia, right MCA stroke with minimal residual, non-STEMI, CAD, AAA, stage IIIa chronic kidney disease, carotid disease, carotid stenting, and a right upper lobe lung nodule.  He presented with chest pain back in January.  He had cardiac catheterization which showed mild CAD.  A CT of the chest showed a 1.5 cm spiculated right upper lobe lung nodule.  He went home but returned a day later with recurrent chest pain.  He ruled in for non-ST elevation MI.  Repeat catheterization showed a 70 to 80% LAD lesion.  He was treated medically.  He did have an echocardiogram showing preserved left-ventricular function with a calcified sclerotic aortic valve but no significant aortic stenosis.  He recently had a follow-up CT and the nodule is increased in size.  He has not had any chest pain since his admission in January.  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  He walked up 3 flights of stairs yesterday without stopping without shortness of breath.  No change in appetite or weight loss.  Overall feels well.  He did smoke when he was younger but quit over 40 years ago.  Zubrod Score: At the time of surgery this patient's most appropriate activity status/level should be described as: [x]    0    Normal activity, no symptoms []    1    Restricted in physical strenuous activity but ambulatory, able to do out light work []    2    Ambulatory and capable of self care, unable to do work activities, up and about >50 % of waking hours                              []    3    Only limited self care, in bed greater than 50% of waking hours []    4    Completely disabled, no  self care, confined to bed or chair []    5    Moribund   Past Medical History:  Diagnosis Date   Abdominal aneurysm (HCC)    Depression    History of kidney stones    Hyperlipidemia    Hypertension    Stroke ([facility]) 2004?   TIA    Past Surgical History:  Procedure Laterality Date   COLON SURGERY     polyps removed   COLONOSCOPY     CORONARY ANGIOGRAPHY N/A 12/29/2022   Procedure: CORONARY ANGIOGRAPHY;  Surgeon: Berry, Jonathan J, MD;  [facility];  Service: Cardiovascular;  Laterality: N/A;   EYE SURGERY     cataract right   IR ANGIO INTRA EXTRACRAN SEL COM CAROTID INNOMINATE BILAT MOD SED  02/28/2018   IR ANGIO VERTEBRAL SEL SUBCLAVIAN INNOMINATE UNI R MOD SED  02/28/2018   IR RADIOLOGIST EVAL & MGMT  02/12/2021   LEFT HEART CATH AND CORONARY ANGIOGRAPHY N/A 12/26/2022   Procedure: LEFT HEART CATH AND CORONARY ANGIOGRAPHY;  Surgeon: Berry, Jonathan J, MD;  [facility]: [facility];  Service: Cardiovascular;  Laterality: N/A;   OTHER SURGICAL HISTORY       Carotid    RADIOLOGY WITH ANESTHESIA N/A 02/28/2018   Procedure: STENTING;  Surgeon: Deveshwar, Sanjeev, MD;  Location: [facility];  Service: Radiology;  Laterality: N/A;    Family History  Problem Relation Age of Onset   Cancer Mother        breast   Heart disease Mother    Heart disease Brother    Tremor Neg Hx     Social History Social History   Tobacco Use   Smoking status: Former   Smokeless tobacco: Never  Vaping Use   Vaping Use: Never used  Substance Use Topics   Alcohol use: No   Drug use: No    Current Outpatient Medications  Medication Sig Dispense Refill   ALPRAZolam (XANAX) 0.5 MG tablet Take 0.5 mg by mouth every evening. Take 0.5 mg in the morning and 1 mg at bedtime     amLODipine (NORVASC) 5 MG tablet Take 5 mg by mouth daily.     aspirin 81 MG tablet Take 81 mg by mouth daily.     atorvastatin (LIPITOR) 80 MG tablet Take 1 tablet (80 mg total) by mouth daily. 90 tablet 3    CHELATED ZINC PO Take 1 capsule by mouth daily. May take 1 additional capsule in the evening     clopidogrel (PLAVIX) 75 MG tablet Take 75 mg by mouth daily.     isosorbide mononitrate (IMDUR) 30 MG 24 hr tablet Take 0.5 tablets (15 mg total) by mouth daily. 30 tablet 5   mirtazapine (REMERON) 7.5 MG tablet TAKE 1 TABLET(7.5 MG) BY MOUTH AT BEDTIME (Patient taking differently: Take 7.5 mg by mouth at bedtime.) 90 tablet 1   Multiple Vitamins-Minerals (CENTRUM ADULTS PO) Take 1 tablet by mouth daily.      nitroGLYCERIN (NITROSTAT) 0.4 MG SL tablet Place 1 tablet (0.4 mg total) under the tongue every 5 (five) minutes as needed for chest pain (up to 3 doses. If taking 3rd dose, call 911). 25 tablet 3   Omega-3 Fatty Acids (FISH OIL PO) Take 1 capsule by mouth daily.     sertraline (ZOLOFT) 50 MG tablet TAKE 3 TABLETS(150 MG) BY MOUTH DAILY (Patient taking differently: Take 150 mg by mouth daily.) 270 tablet 1   No current facility-administered medications for this visit.    Allergies  Allergen Reactions   Bupropion Other (See Comments)    Unsure about alleries   Valproic Acid Other (See Comments)    Unsure about this    Review of Systems  Constitutional:  Negative for activity change, appetite change, chills, fever and unexpected weight change.  HENT:  Positive for hearing loss. Negative for trouble swallowing and voice change.   Respiratory:  Negative for cough, shortness of breath and wheezing.   Cardiovascular:  Negative for chest pain, palpitations and leg swelling.  Gastrointestinal:  Negative for abdominal distention and abdominal pain.  Musculoskeletal:  Negative for arthralgias.  Neurological:  Positive for speech difficulty (occ mild expressive aphasia) and weakness (mild left arm).  Hematological:  Negative for adenopathy. Bruises/bleeds easily.  All other systems reviewed and are negative.   BP (!) 91/57   Pulse 72   Resp 20   Ht 6' (1.829 m)   Wt 187 lb (84.8 kg)   SpO2  96% Comment: RA  BMI 25.36 kg/m  Physical Exam Vitals reviewed.  Constitutional:      General: He is not in acute distress.    Appearance: Normal appearance.  HENT:     Head:   Normocephalic and atraumatic.     Ears:     Comments: Hearing impaired Eyes:     General: No scleral icterus.    Extraocular Movements: Extraocular movements intact.  Cardiovascular:     Rate and Rhythm: Normal rate and regular rhythm.     Heart sounds: Murmur (2/6 systolic murmur) heard.     No friction rub. No gallop.  Pulmonary:     Effort: Pulmonary effort is normal. No respiratory distress.     Breath sounds: Normal breath sounds. No wheezing or rales.  Abdominal:     General: There is no distension.     Palpations: Abdomen is soft.  Musculoskeletal:     Cervical back: Neck supple.  Lymphadenopathy:     Cervical: No cervical adenopathy.  Skin:    General: Skin is warm and dry.  Neurological:     General: No focal deficit present.     Mental Status: He is alert and oriented to person, place, and time.     Cranial Nerves: No cranial nerve deficit.     Motor: No weakness.    Diagnostic Tests: CT CHEST WITHOUT CONTRAST   TECHNIQUE: Multidetector CT imaging of the chest was performed following the standard protocol without IV contrast.   RADIATION DOSE REDUCTION: This exam was performed according to the departmental dose-optimization program which includes automated exposure control, adjustment of the mA and/or kV according to patient size and/or use of iterative reconstruction technique.   COMPARISON:  Chest CT 12/23/2022.   FINDINGS: Cardiovascular: Heart size is normal. There is no significant pericardial fluid, thickening or pericardial calcification. There is aortic atherosclerosis, as well as atherosclerosis of the great vessels of the mediastinum and the coronary arteries, including calcified atherosclerotic plaque in the left main, left anterior descending, left circumflex and  right coronary arteries. Severe calcifications of the aortic valve.   Mediastinum/Nodes: No pathologically enlarged mediastinal or hilar lymph nodes. Please note that accurate exclusion of hilar adenopathy is limited on noncontrast CT scans. Esophagus is unremarkable in appearance. No axillary lymphadenopathy.   Lungs/Pleura: In the right upper lobe (axial image 74 of series 3 and coronal image 180 of series 4) there is an aggressive appearing macrolobulated pulmonary nodule with spiculated margins measuring 1.8 x 1.4 x 1.5 cm, highly concerning for primary bronchogenic neoplasm. This appears enlarged compared to the recent prior study. No other definite suspicious appearing pulmonary nodules or masses are noted. No acute consolidative airspace disease. No pleural effusions. Diffuse bronchial wall thickening with mild centrilobular and paraseptal emphysema. Patchy areas of mild ground-glass attenuation and septal thickening are also noted in the lungs, most evident in the mid to lower lungs.   Upper Abdomen: Aortic atherosclerosis. Incompletely imaged exophytic 2.5 cm low-attenuation lesion in the upper pole of the left kidney, incompletely characterized on today's noncontrast CT examination, but statistically likely a cyst (no imaging follow-up recommended).   Musculoskeletal: There are no aggressive appearing lytic or blastic lesions noted in the visualized portions of the skeleton.   IMPRESSION: 1. Interval enlargement of an aggressive appearing pulmonary nodule in the right upper lobe highly concerning for primary bronchogenic neoplasm. Additional imaging evaluation (such as PET-CT) and consultation with Pulmonology and/or thoracic Surgery recommended. 2. Aortic atherosclerosis, in addition to left main and three-vessel coronary artery disease. Please note that although the presence of coronary artery calcium documents the presence of coronary artery disease, the severity of  this disease and any potential stenosis cannot be assessed on this non-gated CT examination. Assessment   for potential risk factor modification, dietary therapy or pharmacologic therapy may be warranted, if clinically indicated. 3. There are calcifications of the aortic valve. Echocardiographic correlation for evaluation of potential valvular dysfunction may be warranted if clinically indicated. 4. Diffuse bronchial wall thickening with mild centrilobular and paraseptal emphysema; imaging findings suggestive of underlying COPD. 5. There also subtle changes concerning for early interstitial lung disease in the lung bases, as above. Nonemergent outpatient referral to Pulmonology for further clinical evaluation is suggested.   These results will be called to the ordering clinician or representative by the Radiologist Assistant, and communication documented in the PACS or [employer] [facility].   Aortic Atherosclerosis (ICD10-I70.0) and Emphysema (ICD10-J43.9).     Electronically Signed   By: Daniel  Entrikin M.D.   On: 03/21/2023 08:26 I personally reviewed the CT images.  There is a spiculated right upper lobe nodule that has increased in size by about 2 to 3 mm in each dimension since his scan in January.  Aortic and coronary atherosclerosis.  Aortic valve calcifications.  Also possible ILD/scarring at lung bases.  Impression: Gregory [name] is an 88-year-old man with a history of remote tobacco use, hypertension, hyperlipidemia, right MCA stroke with minimal residual, non-STEMI, CAD, AAA, stage IIIa chronic kidney disease, carotid disease, carotid stenting, and a right upper lobe lung nodule.  Right upper lobe lung nodule-highly suspicious lesion.  Has increased in size over the past 3 months.  Most likely a new primary bronchogenic carcinoma.  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  However, it has to be considered  lung cancer unless can be proven otherwise.  While in many cases I would proceed straight to surgery given his age, comorbidities, and working on a farm I think a navigational bronchoscopy to try to establish a definitive diagnosis is appropriate in his case.  He needs a PET/CT to guide our initial diagnostic workup.  Obviously if he had nodal or distant metastases that would change our approach.  We will plan to do the CT component with super D protocol for navigational planning.  He needs pulmonary function testing with and without bronchodilators to ensure that he is a candidate for surgical resection.  I discussed with Mr. Kolk and his son the 2 treatment options should this be a new lung cancer.  Those are surgical resection and stereotactic radiation.  I reviewed the advantages and disadvantages of both.  He does appear to be a surgical candidate, but at his age he may decide he does not want to go through that.  I did discussed with him the general nature of the surgery including the need for general anesthesia, the incisions to be used, the use of the surgical robot, use of drains to postoperatively, as well as the recovery.  We did not go into detail on risks of lobectomy at this time.  We did discuss navigational bronchoscopy.  He understands that would be done in the operating room under general anesthesia but without any incisions.  We would plan to do that on an outpatient basis.  He understands the risk include those associated with general anesthesia including death, MI, DVT, PE.  Procedure specific risks include pneumothorax, bleeding, and failure to make a diagnosis.  Coronary artery disease-managed medically.  No anginal symptoms at present time.  Aortic sclerosis-has a prominent aortic murmur.  Echo in January showed calcification of the aortic valve leaflets, but no evidence of stenosis.      Plan: PET/CT to guide initial diagnostic workup Will have CT done with super  dimension protocol. Navigational bronchoscopy for biopsy of the right upper lobe nodule after PET/CT completed  Alyric Parkin C Sadae Arrazola, MD [facility] [phone]  

## 2023-04-06 NOTE — Progress Notes (Signed)
PCP is Barbie Banner, MD Referring Provider is Rollene Rotunda, MD  Chief Complaint  Patient presents with   Lung Lesion    Surgical consult, Chest CT 03/20/23    HPI: Gregory [name] sent for consultation regarding a right upper lobe lung nodule.  Gregory [name] is an 89 year old man with a history of remote tobacco use, hypertension, hyperlipidemia, right MCA stroke with minimal residual, non-STEMI, CAD, AAA, stage IIIa chronic kidney disease, carotid disease, carotid stenting, and a right upper lobe lung nodule.  He presented with chest pain back in January.  He had cardiac catheterization which showed mild CAD.  A CT of the chest showed a 1.5 cm spiculated right upper lobe lung nodule.  He went home but returned a day later with recurrent chest pain.  He ruled in for non-ST elevation MI.  Repeat catheterization showed a 70 to 80% LAD lesion.  He was treated medically.  He did have an echocardiogram showing preserved left-ventricular function with a calcified sclerotic aortic valve but no significant aortic stenosis.  He recently had a follow-up CT and the nodule is increased in size.  He has not had any chest pain since his admission in January.  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  He walked up 3 flights of stairs yesterday without stopping without shortness of breath.  No change in appetite or weight loss.  Overall feels well.  He did smoke when he was younger but quit over 40 years ago.  Zubrod Score: At the time of surgery this patient's most appropriate activity status/level should be described as: [x]     0    Normal activity, no symptoms []     1    Restricted in physical strenuous activity but ambulatory, able to do out light work []     2    Ambulatory and capable of self care, unable to do work activities, up and about >50 % of waking hours                              []     3    Only limited self care, in bed greater than 50% of waking hours []     4    Completely disabled, no  self care, confined to bed or chair []     5    Moribund   Past Medical History:  Diagnosis Date   Abdominal aneurysm (HCC)    Depression    History of kidney stones    Hyperlipidemia    Hypertension    Stroke [facility]) 2004?   TIA    Past Surgical History:  Procedure Laterality Date   COLON SURGERY     polyps removed   COLONOSCOPY     CORONARY ANGIOGRAPHY N/A 12/29/2022   Procedure: CORONARY ANGIOGRAPHY;  Surgeon: Runell Gess, MD;  Location: [facility] INVASIVE CV LAB;  Service: Cardiovascular;  Laterality: N/A;   EYE SURGERY     cataract right   IR ANGIO INTRA EXTRACRAN SEL COM CAROTID INNOMINATE BILAT MOD SED  02/28/2018   IR ANGIO VERTEBRAL SEL SUBCLAVIAN INNOMINATE UNI R MOD SED  02/28/2018   IR RADIOLOGIST EVAL & MGMT  02/12/2021   LEFT HEART CATH AND CORONARY ANGIOGRAPHY N/A 12/26/2022   Procedure: LEFT HEART CATH AND CORONARY ANGIOGRAPHY;  Surgeon: Runell Gess, MD;  Location: [facility] INVASIVE CV LAB;  Service: Cardiovascular;  Laterality: N/A;   OTHER SURGICAL HISTORY  Carotid    RADIOLOGY WITH ANESTHESIA N/A 02/28/2018   Procedure: STENTING;  Surgeon: Julieanne Cotton, MD;  Location: [facility] OR;  Service: Radiology;  Laterality: N/A;    Family History  Problem Relation Age of Onset   Cancer Mother        breast   Heart disease Mother    Heart disease Brother    Tremor Neg Hx     Social History Social History   Tobacco Use   Smoking status: Former   Smokeless tobacco: Never  Building services engineer Use: Never used  Substance Use Topics   Alcohol use: No   Drug use: No    Current Outpatient Medications  Medication Sig Dispense Refill   ALPRAZolam (XANAX) 0.5 MG tablet Take 0.5 mg by mouth every evening. Take 0.5 mg in the morning and 1 mg at bedtime     amLODipine (NORVASC) 5 MG tablet Take 5 mg by mouth daily.     aspirin 81 MG tablet Take 81 mg by mouth daily.     atorvastatin (LIPITOR) 80 MG tablet Take 1 tablet (80 mg total) by mouth daily. 90 tablet 3    CHELATED ZINC PO Take 1 capsule by mouth daily. May take 1 additional capsule in the evening     clopidogrel (PLAVIX) 75 MG tablet Take 75 mg by mouth daily.     isosorbide mononitrate (IMDUR) 30 MG 24 hr tablet Take 0.5 tablets (15 mg total) by mouth daily. 30 tablet 5   mirtazapine (REMERON) 7.5 MG tablet TAKE 1 TABLET(7.5 MG) BY MOUTH AT BEDTIME (Patient taking differently: Take 7.5 mg by mouth at bedtime.) 90 tablet 1   Multiple Vitamins-Minerals (CENTRUM ADULTS PO) Take 1 tablet by mouth daily.      nitroGLYCERIN (NITROSTAT) 0.4 MG SL tablet Place 1 tablet (0.4 mg total) under the tongue every 5 (five) minutes as needed for chest pain (up to 3 doses. If taking 3rd dose, call 911). 25 tablet 3   Omega-3 Fatty Acids (FISH OIL PO) Take 1 capsule by mouth daily.     sertraline (ZOLOFT) 50 MG tablet TAKE 3 TABLETS(150 MG) BY MOUTH DAILY (Patient taking differently: Take 150 mg by mouth daily.) 270 tablet 1   No current facility-administered medications for this visit.    Allergies  Allergen Reactions   Bupropion Other (See Comments)    Unsure about alleries   Valproic Acid Other (See Comments)    Unsure about this    Review of Systems  Constitutional:  Negative for activity change, appetite change, chills, fever and unexpected weight change.  HENT:  Positive for hearing loss. Negative for trouble swallowing and voice change.   Respiratory:  Negative for cough, shortness of breath and wheezing.   Cardiovascular:  Negative for chest pain, palpitations and leg swelling.  Gastrointestinal:  Negative for abdominal distention and abdominal pain.  Musculoskeletal:  Negative for arthralgias.  Neurological:  Positive for speech difficulty (occ mild expressive aphasia) and weakness (mild left arm).  Hematological:  Negative for adenopathy. Bruises/bleeds easily.  All other systems reviewed and are negative.   BP (!) 91/57   Pulse 72   Resp 20   Ht 6' (1.829 m)   Wt 187 lb (84.8 kg)   SpO2  96% Comment: RA  BMI 25.36 kg/m  Physical Exam Vitals reviewed.  Constitutional:      General: He is not in acute distress.    Appearance: Normal appearance.  HENT:     Head:  Normocephalic and atraumatic.     Ears:     Comments: Hearing impaired Eyes:     General: No scleral icterus.    Extraocular Movements: Extraocular movements intact.  Cardiovascular:     Rate and Rhythm: Normal rate and regular rhythm.     Heart sounds: Murmur (2/6 systolic murmur) heard.     No friction rub. No gallop.  Pulmonary:     Effort: Pulmonary effort is normal. No respiratory distress.     Breath sounds: Normal breath sounds. No wheezing or rales.  Abdominal:     General: There is no distension.     Palpations: Abdomen is soft.  Musculoskeletal:     Cervical back: Neck supple.  Lymphadenopathy:     Cervical: No cervical adenopathy.  Skin:    General: Skin is warm and dry.  Neurological:     General: No focal deficit present.     Mental Status: He is alert and oriented to person, place, and time.     Cranial Nerves: No cranial nerve deficit.     Motor: No weakness.    Diagnostic Tests: CT CHEST WITHOUT CONTRAST   TECHNIQUE: Multidetector CT imaging of the chest was performed following the standard protocol without IV contrast.   RADIATION DOSE REDUCTION: This exam was performed according to the departmental dose-optimization program which includes automated exposure control, adjustment of the mA and/or kV according to patient size and/or use of iterative reconstruction technique.   COMPARISON:  Chest CT 12/23/2022.   FINDINGS: Cardiovascular: Heart size is normal. There is no significant pericardial fluid, thickening or pericardial calcification. There is aortic atherosclerosis, as well as atherosclerosis of the great vessels of the mediastinum and the coronary arteries, including calcified atherosclerotic plaque in the left main, left anterior descending, left circumflex and  right coronary arteries. Severe calcifications of the aortic valve.   Mediastinum/Nodes: No pathologically enlarged mediastinal or hilar lymph nodes. Please note that accurate exclusion of hilar adenopathy is limited on noncontrast CT scans. Esophagus is unremarkable in appearance. No axillary lymphadenopathy.   Lungs/Pleura: In the right upper lobe (axial image 74 of series 3 and coronal image 180 of series 4) there is an aggressive appearing macrolobulated pulmonary nodule with spiculated margins measuring 1.8 x 1.4 x 1.5 cm, highly concerning for primary bronchogenic neoplasm. This appears enlarged compared to the recent prior study. No other definite suspicious appearing pulmonary nodules or masses are noted. No acute consolidative airspace disease. No pleural effusions. Diffuse bronchial wall thickening with mild centrilobular and paraseptal emphysema. Patchy areas of mild ground-glass attenuation and septal thickening are also noted in the lungs, most evident in the mid to lower lungs.   Upper Abdomen: Aortic atherosclerosis. Incompletely imaged exophytic 2.5 cm low-attenuation lesion in the upper pole of the left kidney, incompletely characterized on today's noncontrast CT examination, but statistically likely a cyst (no imaging follow-up recommended).   Musculoskeletal: There are no aggressive appearing lytic or blastic lesions noted in the visualized portions of the skeleton.   IMPRESSION: 1. Interval enlargement of an aggressive appearing pulmonary nodule in the right upper lobe highly concerning for primary bronchogenic neoplasm. Additional imaging evaluation (such as PET-CT) and consultation with Pulmonology and/or thoracic Surgery recommended. 2. Aortic atherosclerosis, in addition to left main and three-vessel coronary artery disease. Please note that although the presence of coronary artery calcium documents the presence of coronary artery disease, the severity of  this disease and any potential stenosis cannot be assessed on this non-gated CT examination. Assessment  for potential risk factor modification, dietary therapy or pharmacologic therapy may be warranted, if clinically indicated. 3. There are calcifications of the aortic valve. Echocardiographic correlation for evaluation of potential valvular dysfunction may be warranted if clinically indicated. 4. Diffuse bronchial wall thickening with mild centrilobular and paraseptal emphysema; imaging findings suggestive of underlying COPD. 5. There also subtle changes concerning for early interstitial lung disease in the lung bases, as above. Nonemergent outpatient referral to Pulmonology for further clinical evaluation is suggested.   These results will be called to the ordering clinician or representative by the Radiologist Assistant, and communication documented in the PACS or [employer].   Aortic Atherosclerosis (ICD10-I70.0) and Emphysema (ICD10-J43.9).     Electronically Signed   By: Trudie Reed M.D.   On: 03/21/2023 08:26 I personally reviewed the CT images.  There is a spiculated right upper lobe nodule that has increased in size by about 2 to 3 mm in each dimension since his scan in January.  Aortic and coronary atherosclerosis.  Aortic valve calcifications.  Also possible ILD/scarring at lung bases.  Impression: Gregory [name] is an 89 year old man with a history of remote tobacco use, hypertension, hyperlipidemia, right MCA stroke with minimal residual, non-STEMI, CAD, AAA, stage IIIa chronic kidney disease, carotid disease, carotid stenting, and a right upper lobe lung nodule.  Right upper lobe lung nodule-highly suspicious lesion.  Has increased in size over the past 3 months.  Most likely a new primary bronchogenic carcinoma.  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  However, it has to be considered  lung cancer unless can be proven otherwise.  While in many cases I would proceed straight to surgery given his age, comorbidities, and working on a farm I think a navigational bronchoscopy to try to establish a definitive diagnosis is appropriate in his case.  He needs a PET/CT to guide our initial diagnostic workup.  Obviously if he had nodal or distant metastases that would change our approach.  We will plan to do the CT component with super D protocol for navigational planning.  He needs pulmonary function testing with and without bronchodilators to ensure that he is a candidate for surgical resection.  I discussed with Gregory [name] and his son the 2 treatment options should this be a new lung cancer.  Those are surgical resection and stereotactic radiation.  I reviewed the advantages and disadvantages of both.  He does appear to be a surgical candidate, but at his age he may decide he does not want to go through that.  I did discussed with him the general nature of the surgery including the need for general anesthesia, the incisions to be used, the use of the surgical robot, use of drains to postoperatively, as well as the recovery.  We did not go into detail on risks of lobectomy at this time.  We did discuss navigational bronchoscopy.  He understands that would be done in the operating room under general anesthesia but without any incisions.  We would plan to do that on an outpatient basis.  He understands the risk include those associated with general anesthesia including death, MI, DVT, PE.  Procedure specific risks include pneumothorax, bleeding, and failure to make a diagnosis.  Coronary artery disease-managed medically.  No anginal symptoms at present time.  Aortic sclerosis-has a prominent aortic murmur.  Echo in January showed calcification of the aortic valve leaflets, but no evidence of stenosis.  Plan: PET/CT to guide initial diagnostic workup Will have CT done with super  dimension protocol. Navigational bronchoscopy for biopsy of the right upper lobe nodule after PET/CT completed  [name], MD [facility] [phone]

## 2023-04-07 ENCOUNTER — Other Ambulatory Visit: Payer: Self-pay | Admitting: *Deleted

## 2023-04-07 ENCOUNTER — Other Ambulatory Visit: Payer: Self-pay | Admitting: Thoracic Surgery (Cardiothoracic Vascular Surgery)

## 2023-04-07 ENCOUNTER — Encounter: Payer: Self-pay | Admitting: *Deleted

## 2023-04-07 DIAGNOSIS — R911 Solitary pulmonary nodule: Secondary | ICD-10-CM

## 2023-04-10 ENCOUNTER — Encounter: Payer: Self-pay | Admitting: *Deleted

## 2023-04-11 ENCOUNTER — Other Ambulatory Visit: Payer: Self-pay | Admitting: Thoracic Surgery (Cardiothoracic Vascular Surgery)

## 2023-04-11 DIAGNOSIS — R911 Solitary pulmonary nodule: Secondary | ICD-10-CM

## 2023-04-13 ENCOUNTER — Other Ambulatory Visit: Payer: Self-pay | Admitting: Thoracic Surgery (Cardiothoracic Vascular Surgery)

## 2023-04-13 ENCOUNTER — Ambulatory Visit (HOSPITAL_COMMUNITY)
Admission: RE | Admit: 2023-04-13 | Discharge: 2023-04-13 | Disposition: A | Discharge status: Home / Self Care | Payer: PPO | Source: Ambulatory Visit | Attending: Thoracic Surgery (Cardiothoracic Vascular Surgery) | Admitting: Thoracic Surgery (Cardiothoracic Vascular Surgery)

## 2023-04-13 DIAGNOSIS — R911 Solitary pulmonary nodule: Secondary | ICD-10-CM

## 2023-04-13 DIAGNOSIS — Z87891 Personal history of nicotine dependence: Secondary | ICD-10-CM | POA: Diagnosis not present

## 2023-04-13 DIAGNOSIS — R0609 Other forms of dyspnea: Secondary | ICD-10-CM | POA: Diagnosis not present

## 2023-04-13 DIAGNOSIS — Z01812 Encounter for preprocedural laboratory examination: Secondary | ICD-10-CM | POA: Diagnosis present

## 2023-04-13 LAB — PULMONARY FUNCTION TEST
DL/VA % pred: 64 %
DL/VA: 2.43 ml/min/mmHg/L
DLCO unc % pred: 59 %
DLCO unc: 15.02 ml/min/mmHg
FEF 25-75 Post: 2.46 L/sec
FEF 25-75 Pre: 1.87 L/sec
FEF2575-%Change-Post: 31 %
FEF2575-%Pred-Post: 131 %
FEF2575-%Pred-Pre: 100 %
FEV1-%Change-Post: 15 %
FEV1-%Pred-Post: 103 %
FEV1-%Pred-Pre: 89 %
FEV1-Post: 2.99 L
FEV1-Pre: 2.59 L
FEV1FVC-%Change-Post: 12 %
FEV1FVC-%Pred-Pre: 93 %
FEV6-%Change-Post: 4 %
FEV6-%Pred-Post: 103 %
FEV6-%Pred-Pre: 99 %
FEV6-Post: 3.95 L
FEV6-Pre: 3.79 L
FEV6FVC-%Change-Post: 0 %
FEV6FVC-%Pred-Post: 107 %
FEV6FVC-%Pred-Pre: 107 %
FVC-%Change-Post: 2 %
FVC-%Pred-Post: 98 %
FVC-%Pred-Pre: 95 %
FVC-Post: 4.03 L
FVC-Pre: 3.93 L
Post FEV1/FVC ratio: 74 %
Post FEV6/FVC ratio: 100 %
Pre FEV1/FVC ratio: 66 %
Pre FEV6/FVC Ratio: 100 %
RV % pred: 155 %
RV: 4.5 L
TLC % pred: 113 %
TLC: 8.52 L

## 2023-04-13 MED ORDER — ALBUTEROL SULFATE (2.5 MG/3ML) 0.083% IN NEBU
2.5000 mg | INHALATION_SOLUTION | Freq: Once | RESPIRATORY_TRACT | Status: AC
  Administered 2023-04-13: 2.5 mg via RESPIRATORY_TRACT

## 2023-04-19 DIAGNOSIS — I219 Acute myocardial infarction, unspecified: Secondary | ICD-10-CM

## 2023-04-19 HISTORY — DX: Acute myocardial infarction, unspecified: I21.9

## 2023-04-21 ENCOUNTER — Encounter (HOSPITAL_COMMUNITY): Payer: PPO

## 2023-04-21 ENCOUNTER — Other Ambulatory Visit (HOSPITAL_COMMUNITY): Payer: PPO

## 2023-04-23 ENCOUNTER — Other Ambulatory Visit: Payer: Self-pay | Admitting: Psychiatry

## 2023-04-23 DIAGNOSIS — F5105 Insomnia due to other mental disorder: Secondary | ICD-10-CM

## 2023-04-24 NOTE — Pre-Procedure Instructions (Signed)
Surgical Instructions    Your procedure is scheduled on Apr 27, 2023.  Report to [facility] Main Entrance "A" at 9:20 A.M., then check in with the Admitting office.  Call this number if you have problems the morning of surgery:  [phone]  If you have any questions prior to your surgery date call [phone]: Open Monday-Friday 8am-4pm If you experience any cold or flu symptoms such as cough, fever, chills, shortness of breath, etc. between now and your scheduled surgery, please notify us at the above number.     Remember:  Do not eat or drink after midnight the night before your surgery     Take these medicines the morning of surgery with A SIP OF WATER:  ALPRAZolam (XANAX)   amLODipine (NORVASC)   atorvastatin (LIPITOR)   isosorbide mononitrate (IMDUR)   sertraline (ZOLOFT)   nitroGLYCERIN (NITROSTAT) - may take if needed   STOP PLAVIX & FISH OIL 4 DAYS PRIOR TO SURGERY. Your last dose will be May 4th.   As of today, STOP taking any Aleve, Naproxen, Ibuprofen, Motrin, Advil, Goody's, BC's, all herbal medications, fish oil, and all vitamins.                     Do NOT Smoke (Tobacco/Vaping) for 24 hours prior to your procedure.  If you use a CPAP at night, you may bring your mask/headgear for your overnight stay.   Contacts, glasses, piercing's, hearing aid's, dentures or partials may not be worn into surgery, please bring cases for these belongings.    For patients admitted to the hospital, discharge time will be determined by your treatment team.   Patients discharged the day of surgery will not be allowed to drive home, and someone needs to stay with them for 24 hours.  SURGICAL WAITING ROOM VISITATION Patients having surgery or a procedure may have no more than 2 support people in the waiting area - these visitors may rotate.   Children under the age of 16 must have an adult with them who is not the patient. If the patient needs to stay at the hospital during part  of their recovery, the visitor guidelines for inpatient rooms apply. Pre-op nurse will coordinate an appropriate time for 1 support person to accompany patient in pre-op.  This support person may not rotate.   Please refer to the [facility] website for the visitor guidelines for Inpatients (after your surgery is over and you are in a regular room).    Special instructions:   [place]- Preparing For Surgery  Before surgery, you can play an important role. Because skin is not sterile, your skin needs to be as free of germs as possible. You can reduce the number of germs on your skin by washing with CHG (chlorahexidine gluconate) Soap before surgery.  CHG is an antiseptic cleaner which kills germs and bonds with the skin to continue killing germs even after washing.    Oral Hygiene is also important to reduce your risk of infection.  Remember - BRUSH YOUR TEETH THE MORNING OF SURGERY WITH YOUR REGULAR TOOTHPASTE  Please do not use if you have an allergy to CHG or antibacterial soaps. If your skin becomes reddened/irritated stop using the CHG.  Do not shave (including legs and underarms) for at least 48 hours prior to first CHG shower. It is OK to shave your face.  Please follow these instructions carefully.   Shower the NIGHT BEFORE SURGERY and the MORNING OF SURGERY  If you chose to wash your hair, wash your hair first as usual with your normal shampoo.  After you shampoo, rinse your hair and body thoroughly to remove the shampoo.  Use CHG Soap as you would any other liquid soap. You can apply CHG directly to the skin and wash gently with a scrungie or a clean washcloth.   Apply the CHG Soap to your body ONLY FROM THE NECK DOWN.  Do not use on open wounds or open sores. Avoid contact with your eyes, ears, mouth and genitals (private parts). Wash Face and genitals (private parts)  with your normal soap.   Wash thoroughly, paying special attention to the area where your surgery will be  performed.  Thoroughly rinse your body with warm water from the neck down.  DO NOT shower/wash with your normal soap after using and rinsing off the CHG Soap.  Pat yourself dry with a CLEAN TOWEL.  Wear CLEAN PAJAMAS to bed the night before surgery  Place CLEAN SHEETS on your bed the night before your surgery  DO NOT SLEEP WITH PETS.   Day of Surgery: Take a shower with CHG soap. Do not wear jewelry or makeup Do not wear lotions, powders, perfumes/colognes, or deodorant. Do not shave 48 hours prior to surgery.  Men may shave face and neck. Do not bring valuables to the hospital.  [facility] is not responsible for any belongings or valuables. Do not wear nail polish, gel polish, artificial nails, or any other type of covering on natural nails (fingers and toes) If you have artificial nails or gel coating that need to be removed by a nail salon, please have this removed prior to surgery. Artificial nails or gel coating may interfere with anesthesia's ability to adequately monitor your vital signs.  Wear Clean/Comfortable clothing the morning of surgery Remember to brush your teeth WITH YOUR REGULAR TOOTHPASTE.   Please read over the following fact sheets that you were given.    If you received a COVID test during your pre-op visit  it is requested that you wear a mask when out in public, stay away from anyone that may not be feeling well and notify your surgeon if you develop symptoms. If you have been in contact with anyone that has tested positive in the last 10 days please notify you surgeon.

## 2023-04-25 ENCOUNTER — Other Ambulatory Visit: Payer: Self-pay

## 2023-04-25 ENCOUNTER — Encounter (HOSPITAL_COMMUNITY): Payer: Self-pay

## 2023-04-25 ENCOUNTER — Inpatient Hospital Stay (HOSPITAL_COMMUNITY): Admission: RE | Admit: 2023-04-25 | Payer: PPO | Source: Ambulatory Visit

## 2023-04-25 ENCOUNTER — Encounter (HOSPITAL_COMMUNITY)
Admission: RE | Admit: 2023-04-25 | Discharge: 2023-04-25 | Disposition: A | Discharge status: Home / Self Care | Payer: PPO | Source: Ambulatory Visit | Attending: Thoracic Surgery (Cardiothoracic Vascular Surgery) | Admitting: Thoracic Surgery (Cardiothoracic Vascular Surgery)

## 2023-04-25 ENCOUNTER — Other Ambulatory Visit (HOSPITAL_COMMUNITY): Payer: PPO

## 2023-04-25 ENCOUNTER — Ambulatory Visit (HOSPITAL_COMMUNITY)
Admission: RE | Admit: 2023-04-25 | Discharge: 2023-04-25 | Disposition: A | Discharge status: Home / Self Care | Payer: PPO | Source: Ambulatory Visit | Attending: Thoracic Surgery (Cardiothoracic Vascular Surgery) | Admitting: Thoracic Surgery (Cardiothoracic Vascular Surgery)

## 2023-04-25 VITALS — BP 138/70 | HR 72 | Temp 98.3°F

## 2023-04-25 DIAGNOSIS — K5730 Diverticulosis of large intestine without perforation or abscess without bleeding: Secondary | ICD-10-CM | POA: Insufficient documentation

## 2023-04-25 DIAGNOSIS — I700 Atherosclerosis of aorta: Secondary | ICD-10-CM | POA: Diagnosis not present

## 2023-04-25 DIAGNOSIS — R911 Solitary pulmonary nodule: Secondary | ICD-10-CM

## 2023-04-25 DIAGNOSIS — Z1152 Encounter for screening for COVID-19: Secondary | ICD-10-CM | POA: Diagnosis not present

## 2023-04-25 DIAGNOSIS — J984 Other disorders of lung: Secondary | ICD-10-CM | POA: Diagnosis not present

## 2023-04-25 DIAGNOSIS — N281 Cyst of kidney, acquired: Secondary | ICD-10-CM | POA: Diagnosis not present

## 2023-04-25 DIAGNOSIS — I2510 Atherosclerotic heart disease of native coronary artery without angina pectoris: Secondary | ICD-10-CM | POA: Insufficient documentation

## 2023-04-25 DIAGNOSIS — N400 Enlarged prostate without lower urinary tract symptoms: Secondary | ICD-10-CM | POA: Diagnosis not present

## 2023-04-25 DIAGNOSIS — Z01812 Encounter for preprocedural laboratory examination: Secondary | ICD-10-CM | POA: Diagnosis not present

## 2023-04-25 DIAGNOSIS — Z01818 Encounter for other preprocedural examination: Secondary | ICD-10-CM

## 2023-04-25 DIAGNOSIS — I7143 Infrarenal abdominal aortic aneurysm, without rupture: Secondary | ICD-10-CM | POA: Diagnosis not present

## 2023-04-25 DIAGNOSIS — J439 Emphysema, unspecified: Secondary | ICD-10-CM | POA: Insufficient documentation

## 2023-04-25 HISTORY — DX: Angina pectoris, unspecified: I20.9

## 2023-04-25 LAB — CBC
HCT: 39.3 % (ref 39.0–52.0)
Hemoglobin: 13 g/dL (ref 13.0–17.0)
MCH: 30 pg (ref 26.0–34.0)
MCHC: 33.1 g/dL (ref 30.0–36.0)
MCV: 90.6 fL (ref 80.0–100.0)
Platelets: 143 10*3/uL — ABNORMAL LOW (ref 150–400)
RBC: 4.34 MIL/uL (ref 4.22–5.81)
RDW: 12.3 % (ref 11.5–15.5)
WBC: 6 10*3/uL (ref 4.0–10.5)
nRBC: 0 % (ref 0.0–0.2)

## 2023-04-25 LAB — COMPREHENSIVE METABOLIC PANEL
ALT: 26 U/L (ref 0–44)
AST: 29 U/L (ref 15–41)
Albumin: 3.9 g/dL (ref 3.5–5.0)
Alkaline Phosphatase: 80 U/L (ref 38–126)
Anion gap: 8 (ref 5–15)
BUN: 11 mg/dL (ref 8–23)
CO2: 27 mmol/L (ref 22–32)
Calcium: 9.3 mg/dL (ref 8.9–10.3)
Chloride: 101 mmol/L (ref 98–111)
Creatinine, Ser: 1.13 mg/dL (ref 0.61–1.24)
GFR, Estimated: >60 mL/min (ref >60)
Glucose, Bld: 122 mg/dL — ABNORMAL HIGH (ref 70–99)
Potassium: 4.4 mmol/L (ref 3.5–5.1)
Sodium: 136 mmol/L (ref 135–145)
Total Bilirubin: 1.4 mg/dL — ABNORMAL HIGH (ref 0.3–1.2)
Total Protein: 7 g/dL (ref 6.5–8.1)

## 2023-04-25 LAB — APTT: aPTT: 33 seconds (ref 24–36)

## 2023-04-25 LAB — PROTIME-INR
INR: 1.1 (ref 0.8–1.2)
Prothrombin Time: 14.3 seconds (ref 11.4–15.2)

## 2023-04-25 LAB — GLUCOSE, CAPILLARY: Glucose-Capillary: 111 mg/dL — ABNORMAL HIGH (ref 70–99)

## 2023-04-25 LAB — SARS CORONAVIRUS 2 (TAT 6-24 HRS): SARS Coronavirus 2: NEGATIVE

## 2023-04-25 MED ORDER — FLUDEOXYGLUCOSE F - 18 (FDG) INJECTION
9.2000 | Freq: Once | INTRAVENOUS | Status: AC | PRN
  Administered 2023-04-25: 9.2 via INTRAVENOUS

## 2023-04-25 NOTE — Progress Notes (Signed)
PCP - Dr. Benedetto Goad Cardiologist - Dr. Rollene Rotunda  PPM/ICD - Denies Device Orders - n/a Rep Notified - n/a  Chest x-ray - 04/25/2023 EKG - 12/30/2022 Stress Test - Denies ECHO - 12/24/2022 Cardiac Cath - 12/29/2022  Sleep Study - Denies CPAP - n/a  No DM  Last dose of GLP1 agonist- n/a GLP1 instructions: n/a  Blood Thinner Instructions: Per surgeon, pts last dose of Plavix was May 4th Aspirin Instructions: Pt can continue to take ASA until the day before surgery. He will take none on the morning of surgery  NPO after midnight  COVID TEST- Yes. Result pending.   Anesthesia review: Yes. NSTEMI admission January 2024 with no stents placed. Pt also has a infrarenal AAA that was noted on recent imaging.  Patient denies shortness of breath, fever, cough and chest pain at PAT appointment. Pt denies any respiratory illness/infection in the last two months.   All instructions explained to the patient, with a verbal understanding of the material. Patient agrees to go over the instructions while at home for a better understanding. Patient also instructed to self quarantine after being tested for COVID-19. The opportunity to ask questions was provided.

## 2023-04-27 ENCOUNTER — Ambulatory Visit (HOSPITAL_BASED_OUTPATIENT_CLINIC_OR_DEPARTMENT_OTHER): Payer: PPO | Admitting: Anesthesiology

## 2023-04-27 ENCOUNTER — Encounter (HOSPITAL_COMMUNITY)
Admission: RE | Disposition: A | Discharge status: Home / Self Care | Payer: Self-pay | Source: Home / Self Care | Attending: Thoracic Surgery (Cardiothoracic Vascular Surgery)

## 2023-04-27 ENCOUNTER — Ambulatory Visit (HOSPITAL_COMMUNITY): Payer: PPO | Admitting: Physician Assistant

## 2023-04-27 ENCOUNTER — Ambulatory Visit (HOSPITAL_COMMUNITY)
Admission: RE | Admit: 2023-04-27 | Discharge: 2023-04-27 | Disposition: A | Discharge status: Home / Self Care | Payer: PPO | Attending: Thoracic Surgery (Cardiothoracic Vascular Surgery) | Admitting: Thoracic Surgery (Cardiothoracic Vascular Surgery)

## 2023-04-27 ENCOUNTER — Encounter (HOSPITAL_COMMUNITY): Payer: Self-pay | Admitting: Thoracic Surgery (Cardiothoracic Vascular Surgery)

## 2023-04-27 ENCOUNTER — Other Ambulatory Visit: Payer: Self-pay

## 2023-04-27 ENCOUNTER — Ambulatory Visit (HOSPITAL_COMMUNITY): Payer: PPO

## 2023-04-27 DIAGNOSIS — I129 Hypertensive chronic kidney disease with stage 1 through stage 4 chronic kidney disease, or unspecified chronic kidney disease: Secondary | ICD-10-CM | POA: Insufficient documentation

## 2023-04-27 DIAGNOSIS — R911 Solitary pulmonary nodule: Secondary | ICD-10-CM

## 2023-04-27 DIAGNOSIS — I10 Essential (primary) hypertension: Secondary | ICD-10-CM

## 2023-04-27 DIAGNOSIS — Z7982 Long term (current) use of aspirin: Secondary | ICD-10-CM | POA: Insufficient documentation

## 2023-04-27 DIAGNOSIS — Z7902 Long term (current) use of antithrombotics/antiplatelets: Secondary | ICD-10-CM | POA: Diagnosis not present

## 2023-04-27 DIAGNOSIS — J432 Centrilobular emphysema: Secondary | ICD-10-CM | POA: Diagnosis not present

## 2023-04-27 DIAGNOSIS — I252 Old myocardial infarction: Secondary | ICD-10-CM | POA: Diagnosis not present

## 2023-04-27 DIAGNOSIS — J984 Other disorders of lung: Secondary | ICD-10-CM | POA: Diagnosis not present

## 2023-04-27 DIAGNOSIS — Z79899 Other long term (current) drug therapy: Secondary | ICD-10-CM | POA: Diagnosis not present

## 2023-04-27 DIAGNOSIS — Z87891 Personal history of nicotine dependence: Secondary | ICD-10-CM | POA: Insufficient documentation

## 2023-04-27 DIAGNOSIS — E785 Hyperlipidemia, unspecified: Secondary | ICD-10-CM | POA: Diagnosis not present

## 2023-04-27 DIAGNOSIS — I2510 Atherosclerotic heart disease of native coronary artery without angina pectoris: Secondary | ICD-10-CM | POA: Insufficient documentation

## 2023-04-27 DIAGNOSIS — N1831 Chronic kidney disease, stage 3a: Secondary | ICD-10-CM | POA: Insufficient documentation

## 2023-04-27 DIAGNOSIS — F32A Depression, unspecified: Secondary | ICD-10-CM | POA: Insufficient documentation

## 2023-04-27 DIAGNOSIS — I700 Atherosclerosis of aorta: Secondary | ICD-10-CM | POA: Insufficient documentation

## 2023-04-27 HISTORY — PX: VIDEO BRONCHOSCOPY WITH ENDOBRONCHIAL NAVIGATION: SHX6175

## 2023-04-27 SURGERY — VIDEO BRONCHOSCOPY WITH ENDOBRONCHIAL NAVIGATION
Anesthesia: General

## 2023-04-27 MED ORDER — SUGAMMADEX SODIUM 200 MG/2ML IV SOLN
INTRAVENOUS | Status: DC | PRN
  Administered 2023-04-27: 200 mg via INTRAVENOUS

## 2023-04-27 MED ORDER — FENTANYL CITRATE (PF) 250 MCG/5ML IJ SOLN
INTRAMUSCULAR | Status: AC
  Filled 2023-04-27: qty 5

## 2023-04-27 MED ORDER — LACTATED RINGERS IV SOLN: INTRAVENOUS | Status: DC

## 2023-04-27 MED ORDER — FENTANYL CITRATE (PF) 100 MCG/2ML IJ SOLN: 25.0000 ug | INTRAMUSCULAR | Status: DC | PRN

## 2023-04-27 MED ORDER — EPHEDRINE SULFATE-NACL 50-0.9 MG/10ML-% IV SOSY
PREFILLED_SYRINGE | INTRAVENOUS | Status: DC | PRN
  Administered 2023-04-27: 7.5 mg via INTRAVENOUS
  Administered 2023-04-27 (×2): 5 mg via INTRAVENOUS

## 2023-04-27 MED ORDER — PROPOFOL 10 MG/ML IV BOLUS
INTRAVENOUS | Status: DC | PRN
  Administered 2023-04-27: 120 mg via INTRAVENOUS

## 2023-04-27 MED ORDER — PHENYLEPHRINE 80 MCG/ML (10ML) SYRINGE FOR IV PUSH (FOR BLOOD PRESSURE SUPPORT)
PREFILLED_SYRINGE | INTRAVENOUS | Status: DC | PRN
  Administered 2023-04-27 (×5): 160 ug via INTRAVENOUS

## 2023-04-27 MED ORDER — FENTANYL CITRATE (PF) 250 MCG/5ML IJ SOLN
INTRAMUSCULAR | Status: DC | PRN
  Administered 2023-04-27: 50 ug via INTRAVENOUS

## 2023-04-27 MED ORDER — ROCURONIUM BROMIDE 10 MG/ML (PF) SYRINGE
PREFILLED_SYRINGE | INTRAVENOUS | Status: DC | PRN
  Administered 2023-04-27: 40 mg via INTRAVENOUS
  Administered 2023-04-27: 10 mg via INTRAVENOUS

## 2023-04-27 MED ORDER — DEXAMETHASONE SODIUM PHOSPHATE 10 MG/ML IJ SOLN
INTRAMUSCULAR | Status: DC | PRN
  Administered 2023-04-27: 8 mg via INTRAVENOUS

## 2023-04-27 MED ORDER — 0.9 % SODIUM CHLORIDE (POUR BTL) OPTIME
TOPICAL | Status: DC | PRN
  Administered 2023-04-27: 1000 mL

## 2023-04-27 MED ORDER — ORAL CARE MOUTH RINSE: 15.0000 mL | Freq: Once | OROMUCOSAL | Status: AC

## 2023-04-27 MED ORDER — PHENYLEPHRINE HCL-NACL 20-0.9 MG/250ML-% IV SOLN
INTRAVENOUS | Status: DC | PRN
  Administered 2023-04-27: 60 ug/min via INTRAVENOUS

## 2023-04-27 MED ORDER — ACETAMINOPHEN 10 MG/ML IV SOLN
INTRAVENOUS | Status: AC
  Filled 2023-04-27: qty 100

## 2023-04-27 MED ORDER — EPINEPHRINE PF 1 MG/ML IJ SOLN
INTRAMUSCULAR | Status: AC
  Filled 2023-04-27: qty 1

## 2023-04-27 MED ORDER — PROPOFOL 500 MG/50ML IV EMUL
INTRAVENOUS | Status: DC | PRN
  Administered 2023-04-27: 150 ug/kg/min via INTRAVENOUS

## 2023-04-27 MED ORDER — LIDOCAINE 2% (20 MG/ML) 5 ML SYRINGE
INTRAMUSCULAR | Status: DC | PRN
  Administered 2023-04-27: 60 mg via INTRAVENOUS

## 2023-04-27 MED ORDER — ACETAMINOPHEN 500 MG PO TABS: 1000.0000 mg | ORAL_TABLET | Freq: Once | ORAL | Status: DC

## 2023-04-27 MED ORDER — ACETAMINOPHEN 10 MG/ML IV SOLN
INTRAVENOUS | Status: DC | PRN
  Administered 2023-04-27: 1000 mg via INTRAVENOUS

## 2023-04-27 MED ORDER — ONDANSETRON HCL 4 MG/2ML IJ SOLN
INTRAMUSCULAR | Status: DC | PRN
  Administered 2023-04-27: 4 mg via INTRAVENOUS

## 2023-04-27 MED ORDER — PROPOFOL 10 MG/ML IV BOLUS
INTRAVENOUS | Status: AC
  Filled 2023-04-27: qty 20

## 2023-04-27 MED ORDER — CHLORHEXIDINE GLUCONATE 0.12 % MT SOLN
15.0000 mL | Freq: Once | OROMUCOSAL | Status: AC
  Administered 2023-04-27: 15 mL via OROMUCOSAL
  Filled 2023-04-27: qty 15

## 2023-04-27 MED ORDER — EPINEPHRINE PF 1 MG/ML IJ SOLN
INTRAMUSCULAR | Status: DC | PRN
  Administered 2023-04-27: .3 mg via ENDOTRACHEOPULMONARY

## 2023-04-27 SURGICAL SUPPLY — 50 items
ADAPTER BRONCHOSCOPE OLYMP 190 (ADAPTER) IMPLANT
ADAPTER BRONCHOSCOPE OLYMPUS (ADAPTER) ×1 IMPLANT
ADAPTER VALVE BIOPSY EBUS (MISCELLANEOUS) IMPLANT
ADPR BSCP OLMPS EDG (ADAPTER) ×1
ADPR BSCP STRL LF REUSE (ADAPTER) ×1
ADPTR VALVE BIOPSY EBUS (MISCELLANEOUS)
BLADE CLIPPER SURG (BLADE) ×1 IMPLANT
BRUSH BIOPSY BRONCH 10 SDTNB (MISCELLANEOUS) IMPLANT
BRUSH SUPERTRAX BIOPSY (INSTRUMENTS) IMPLANT
BRUSH SUPERTRAX NDL-TIP CYTO (INSTRUMENTS) ×1 IMPLANT
CANISTER SUCT 3000ML PPV (MISCELLANEOUS) ×1 IMPLANT
CNTNR URN SCR LID CUP LEK RST (MISCELLANEOUS) ×2 IMPLANT
CONT SPEC 4OZ STRL OR WHT (MISCELLANEOUS) ×2
COVER BACK TABLE 60X90IN (DRAPES) ×1 IMPLANT
FILTER STRAW FLUID ASPIR (MISCELLANEOUS) ×1 IMPLANT
FORCEPS BIOP SUPERTRX PREMAR (INSTRUMENTS) IMPLANT
GAUZE 4X4 16PLY ~~LOC~~+RFID DBL (SPONGE) IMPLANT
GAUZE SPONGE 4X4 12PLY STRL (GAUZE/BANDAGES/DRESSINGS) ×1 IMPLANT
GLOVE SS BIOGEL STRL SZ 7.5 (GLOVE) ×1 IMPLANT
GLOVE SURG SIGNA 7.5 PF LTX (GLOVE) ×1 IMPLANT
GOWN STRL REUS W/ TWL XL LVL3 (GOWN DISPOSABLE) ×1 IMPLANT
GOWN STRL REUS W/TWL XL LVL3 (GOWN DISPOSABLE) ×1
KIT CLEAN ENDO COMPLIANCE (KITS) ×1 IMPLANT
KIT ILLUMISITE 180 PROCEDURE (KITS) IMPLANT
KIT ILLUMISITE 90 PROCEDURE (KITS) IMPLANT
KIT TURNOVER KIT B (KITS) ×1 IMPLANT
MARKER SKIN DUAL TIP RULER LAB (MISCELLANEOUS) ×1 IMPLANT
NDL SUPERTRX PREMARK BIOPSY (NEEDLE) IMPLANT
NEEDLE SUPERTRX PREMARK BIOPSY (NEEDLE) ×1 IMPLANT
NS IRRIG 1000ML POUR BTL (IV SOLUTION) ×1 IMPLANT
OIL SILICONE PENTAX (PARTS (SERVICE/REPAIRS)) ×1 IMPLANT
PAD ARMBOARD 7.5X6 YLW CONV (MISCELLANEOUS) ×2 IMPLANT
PATCHES PATIENT (LABEL) ×3 IMPLANT
SPONGE T-LAP 18X18 ~~LOC~~+RFID (SPONGE) IMPLANT
SPONGE T-LAP 4X18 ~~LOC~~+RFID (SPONGE) IMPLANT
SYR 20ML ECCENTRIC (SYRINGE) ×1 IMPLANT
SYR 20ML LL LF (SYRINGE) ×2 IMPLANT
SYR 30ML LL (SYRINGE) IMPLANT
SYR 50ML LL SCALE MARK (SYRINGE) ×1 IMPLANT
SYR 5ML LL (SYRINGE) ×1 IMPLANT
SYR TB 1ML LUER SLIP (SYRINGE) IMPLANT
TOWEL GREEN STERILE (TOWEL DISPOSABLE) ×1 IMPLANT
TOWEL GREEN STERILE FF (TOWEL DISPOSABLE) ×1 IMPLANT
TRAP SPECIMEN MUCUS 40CC (MISCELLANEOUS) ×1 IMPLANT
TUBE CONNECTING 20X1/4 (TUBING) ×2 IMPLANT
UNDERPAD 30X36 HEAVY ABSORB (UNDERPADS AND DIAPERS) ×1 IMPLANT
VALVE BIOPSY  SINGLE USE (MISCELLANEOUS) ×1
VALVE BIOPSY SINGLE USE (MISCELLANEOUS) ×1 IMPLANT
VALVE SUCTION BRONCHIO DISP (MISCELLANEOUS) ×1 IMPLANT
WATER STERILE IRR 1000ML POUR (IV SOLUTION) ×1 IMPLANT

## 2023-04-27 NOTE — Anesthesia Procedure Notes (Signed)
Procedure Name: Intubation Date/Time: 04/27/2023 1:35 PM  Performed by: Randon Goldsmith, CRNAPre-anesthesia Checklist: Patient identified, Emergency Drugs available, Suction available and Patient being monitored Patient Re-evaluated:Patient Re-evaluated prior to induction Oxygen Delivery Method: Circle system utilized Preoxygenation: Pre-oxygenation with 100% oxygen Induction Type: IV induction and Cricoid Pressure applied Ventilation: Mask ventilation without difficulty Laryngoscope Size: Mac and 4 Grade View: Grade I Tube type: Oral Tube size: 8.5 mm Number of attempts: 1 Airway Equipment and Method: Stylet and Oral airway Placement Confirmation: ETT inserted through vocal cords under direct vision, positive ETCO2 and breath sounds checked- equal and bilateral Secured at: 23 cm Tube secured with: Tape Dental Injury: Teeth and Oropharynx as per pre-operative assessment

## 2023-04-27 NOTE — Brief Op Note (Signed)
04/27/2023  2:51 PM  PATIENT:  Gregory [name]  89 y.o. male  PRE-OPERATIVE DIAGNOSIS:  RUL NODULE  POST-OPERATIVE DIAGNOSIS:  RIGHT UPPER LUNG NODULE  PROCEDURE:  Procedure(s): VIDEO BRONCHOSCOPY WITH ENDOBRONCHIAL NAVIGATION W/ BIOPSIES (N/A) Needle aspirations, brushings, transbronchial biopsies and bronchoalveolar lavage  SURGEON:  Surgeon(s) and Role:    * [name], MD - Primary  PHYSICIAN ASSISTANT:   ASSISTANTS: none   ANESTHESIA:   general  EBL:  minimal  BLOOD ADMINISTERED:none  DRAINS: none   LOCAL MEDICATIONS USED:  NONE  SPECIMEN:  Source of Specimen:  RUL nodule  DISPOSITION OF SPECIMEN:   Path and micro  COUNTS:  NO endoscopic  TOURNIQUET:  * No tourniquets in log *  DICTATION: .Other Dictation: Dictation Number -  PLAN OF CARE: Discharge to home after PACU  PATIENT DISPOSITION:  PACU - hemodynamically stable.   Delay start of Pharmacological VTE agent (>24hrs) due to surgical blood loss or risk of bleeding: not applicable

## 2023-04-27 NOTE — Anesthesia Postprocedure Evaluation (Signed)
Anesthesia Post Note  Patient: Gregory [name]  Procedure(s) Performed: VIDEO BRONCHOSCOPY WITH ENDOBRONCHIAL NAVIGATION W/ BIOPSIES     Patient location during evaluation: PACU Anesthesia Type: General Level of consciousness: awake and alert Pain management: pain level controlled Vital Signs Assessment: post-procedure vital signs reviewed and stable Respiratory status: spontaneous breathing, nonlabored ventilation and respiratory function stable Cardiovascular status: blood pressure returned to baseline and stable Postop Assessment: no apparent nausea or vomiting Anesthetic complications: no  No notable events documented.  Last Vitals:  Vitals:   04/27/23 1515 04/27/23 1530  BP: 112/67 121/61  Pulse: 67 (!) 59  Resp: 14 11  Temp:  (!) 36.4 C  SpO2: 96% 93%    Last Pain:  Vitals:   04/27/23 1530  TempSrc:   PainSc: 0-No pain                 Danel Requena,W. EDMOND

## 2023-04-27 NOTE — Transfer of Care (Signed)
Immediate Anesthesia Transfer of Care Note  Patient: Gregory [name]  Procedure(s) Performed: VIDEO BRONCHOSCOPY WITH ENDOBRONCHIAL NAVIGATION W/ BIOPSIES  Patient Location: PACU  Anesthesia Type:General  Level of Consciousness: drowsy  Airway & Oxygen Therapy: Patient Spontanous Breathing and Patient connected to face mask oxygen  Post-op Assessment: Report given to RN and Post -op Vital signs reviewed and stable  Post vital signs: Reviewed and stable  Last Vitals:  Vitals Value Taken Time  BP 143/74 04/27/23 1500  Temp    Pulse 73 04/27/23 1503  Resp 13 04/27/23 1503  SpO2 93 % 04/27/23 1503  Vitals shown include unvalidated device data.  Last Pain:  Vitals:   04/27/23 0953  TempSrc:   PainSc: 0-No pain         Complications: No notable events documented.

## 2023-04-27 NOTE — Anesthesia Preprocedure Evaluation (Addendum)
Anesthesia Evaluation  Patient identified by MRN, date of birth, ID band Patient awake    Reviewed: Allergy & Precautions, H&P , NPO status , Patient's Chart, lab work & pertinent test results  Airway Mallampati: II  TM Distance: >3 FB Neck ROM: Full    Dental no notable dental hx. (+) Teeth Intact, Dental Advisory Given   Pulmonary former smoker   Pulmonary exam normal breath sounds clear to auscultation       Cardiovascular hypertension, Pt. on medications + CAD and + Past MI   Rhythm:Regular Rate:Normal     Neuro/Psych    Depression    CVA, No Residual Symptoms    GI/Hepatic negative GI ROS, Neg liver ROS,,,  Endo/Other  negative endocrine ROS    Renal/GU negative Renal ROS  negative genitourinary   Musculoskeletal   Abdominal   Peds  Hematology negative hematology ROS (+)   Anesthesia Other Findings   Reproductive/Obstetrics negative OB ROS                             Anesthesia Physical Anesthesia Plan  ASA: 3  Anesthesia Plan: General   Post-op Pain Management: Tylenol PO (pre-op)*   Induction: Intravenous  PONV Risk Score and Plan: 3 and Ondansetron, Dexamethasone and Treatment may vary due to age or medical condition  Airway Management Planned: Oral ETT  Additional Equipment:   Intra-op Plan:   Post-operative Plan: Extubation in OR  Informed Consent: I have reviewed the patients History and Physical, chart, labs and discussed the procedure including the risks, benefits and alternatives for the proposed anesthesia with the patient or authorized representative who has indicated his/her understanding and acceptance.     Dental advisory given  Plan Discussed with: CRNA  Anesthesia Plan Comments:        Anesthesia Quick Evaluation

## 2023-04-27 NOTE — Discharge Instructions (Addendum)
Do not drive or engage in heavy physical activity for 24 hours.  You may cough up small amounts of blood over the next few days.  You may use acetaminophen (Tylenol) if needed for discomfort.  You may use an over the counter cough medication of your choice if needed.  Resume Plavix on Saturday 5/11  Call [phone] if you develop chest pain, shortness of breath, fever > 101 F or cough up more than a tablespoon of blood  My office will contact you with a follow up appointment for next week to discuss the results.

## 2023-04-27 NOTE — Interval H&P Note (Signed)
History and Physical Interval Note:  04/27/2023 12:36 PM  Gregory [name]  has presented today for surgery, with the diagnosis of RUL NODULE.  The various methods of treatment have been discussed with the patient and family. After consideration of risks, benefits and other options for treatment, the patient has consented to  Procedure(s): VIDEO BRONCHOSCOPY WITH ENDOBRONCHIAL NAVIGATION (N/A) as a surgical intervention.  The patient's history has been reviewed, patient examined, no change in status, stable for surgery.  I have reviewed the patient's chart and labs.  Questions were answered to the patient's satisfaction.     [name]

## 2023-04-28 ENCOUNTER — Encounter (HOSPITAL_COMMUNITY): Payer: Self-pay | Admitting: Thoracic Surgery (Cardiothoracic Vascular Surgery)

## 2023-04-28 LAB — ACID FAST SMEAR (AFB, MYCOBACTERIA): Acid Fast Smear: NEGATIVE

## 2023-04-28 NOTE — Op Note (Unsigned)
NAME: Gregory [name], Gregory [name]. MEDICAL RECORD NO: [id number] ACCOUNT NO: [id number] DATE OF BIRTH: 05-Jan-1934 FACILITY: [facility] LOCATION: [facility] PHYSICIAN: Salvatore Decent. Dorris Fetch, MD  Operative Report   DATE OF PROCEDURE: 04/27/2023   PREOPERATIVE DIAGNOSIS:  Right upper lobe lung nodule.  POSTOPERATIVE DIAGNOSIS:  Right upper lobe lung nodule.  PROCEDURE:  Electromagnetic navigational bronchoscopy with needle aspirations, brushings, transbronchial biopsies and bronchoalveolar lavage.  SURGEON:  Salvatore Decent. Dorris Fetch, MD  ASSISTANT:  None.  ANESTHESIA:  General.  FINDINGS:  Normal endobronchial anatomy, friable tissue that bled easily even before sampling was initiated.  Atypical cells seen on needle aspirations. Definitive diagnosis deferred to permanents.  CLINICAL NOTE:  The patient is an 89 year old gentleman with a history of remote tobacco use, who presented in January with chest pain.  A CT of the chest showed a 1.5 cm spiculated right upper lobe lung nodule.  A followup CT showed the nodule had  increased in size.  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 The patient was advised to undergo navigational bronchoscopy for diagnostic purposes.  The indications, risks, benefits, and alternatives were discussed in detail with the  patient.  He understood and accepted the risks and agreed to proceed.  DESCRIPTION OF PROCEDURE:  The patient was brought to the operating room on 04/27/2023.  He had induction of general anesthesia, was intubated.  Planning for the navigational bronchoscopy was done on the console prior to induction.  The patient was  prepared for the navigational bronchoscopy.  Sequential compression devices were placed on the calves for DVT prophylaxis.  A timeout was performed.  Flexible fiberoptic bronchoscopy then was performed via the endotracheal tube, it revealed normal endobronchial anatomy.  There were some thick clear secretions which cleared with  saline.  There were no endobronchial lesions to  the level of subsegmental bronchi.  The locatable guide for navigation was placed.  Registration was performed.  There was good correlation of the video and virtual bronchoscopy.  The bronchoscope then was directed to the right upper lobe bronchus and the appropriate subsegmental bronchus  was cannulated. Even with manipulation of the locatable guide there was some bleeding from the airways.  The catheter was manipulated to within a cm of the nodule and then a local registration was performed.  The catheter then was repositioned and was in  good position relative to the nodule and also in good position based on the fluoroscopy images.  All sampling was done with fluoroscopic guidance, the catheter was within 1.2 cm of the nodule with good alignment.  Three needle aspirations were performed  followed by 3 passes with a needle brush.  While awaiting the quick preps on those, multiple biopsies were taken.  Several of the biopsies were sent for AFB and fungal cultures, but the majority were sent for permanent pathology.  The initial  aspirations did not show any abnormal cells.  The catheter was repositioned slightly and the sampling process was repeated again with needle aspirations followed by brushings followed by transbronchial biopsies. On the second set of aspirations, there  were atypical cells.  Additional biopsies were taken.  Bronchoalveolar lavage then was performed with 100 mL of saline instilled with 10 mL of return.  This was sent for cytology.  The sheath was removed.  There was some bleeding from the right upper  lobe bronchus.  A dilute epinephrine was applied.  After suctioning there was no ongoing bleeding. The bronchoscope was removed.  The patient was extubated in  the operating room and taken to the postanesthetic care unit in good condition.  The total  fluoroscopy time was 273 minutes.  The total dose was 57.49 milligray.     SUJ D:  04/28/2023 5:07:33 pm T: 04/28/2023 10:06:00 pm  JOB: [id number]/ [id number]

## 2023-05-01 LAB — CYTOLOGY - NON PAP

## 2023-05-01 IMAGING — DX DG THORACIC SPINE 2V
2 series · 2 of 2 positions shown · non-contrast
Comparison: None.

CLINICAL DATA: Fall

EXAM:
THORACIC SPINE 2 VIEWS

[t-spine ap]
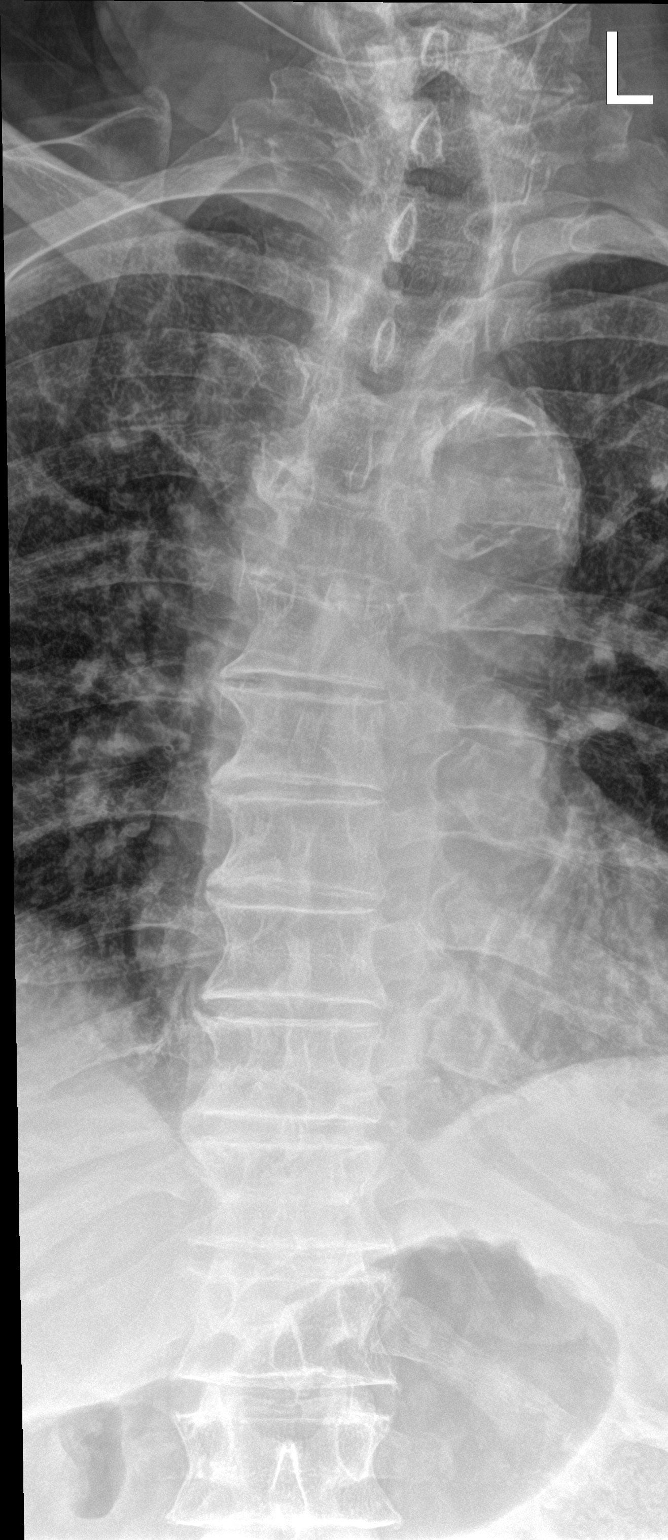

[t-spine lat]
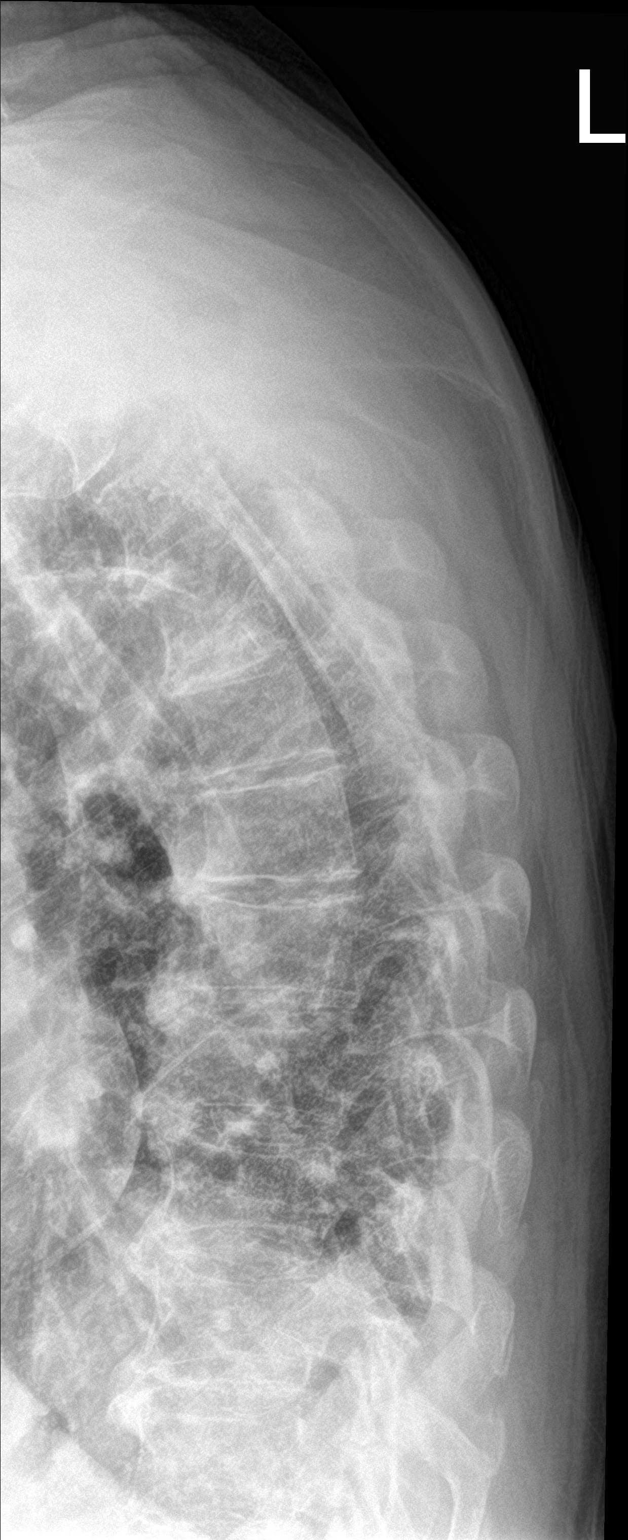

[2 of 2 positions shown; findings below may reference images not displayed]

FINDINGS: Scoliosis in the upper thoracic spine. Diffuse degenerative changes.
No fracture or focal bone lesion. Aortic atherosclerosis.
IMPRESSION: No acute bony abnormality.

## 2023-05-02 LAB — SURGICAL PATHOLOGY

## 2023-05-03 ENCOUNTER — Encounter: Payer: Self-pay | Admitting: Thoracic Surgery (Cardiothoracic Vascular Surgery)

## 2023-05-03 ENCOUNTER — Ambulatory Visit (INDEPENDENT_AMBULATORY_CARE_PROVIDER_SITE_OTHER): Payer: PPO | Admitting: Thoracic Surgery (Cardiothoracic Vascular Surgery)

## 2023-05-03 VITALS — BP 104/64 | HR 67 | Resp 20 | Ht 72.0 in | Wt 185.0 lb

## 2023-05-03 DIAGNOSIS — R911 Solitary pulmonary nodule: Secondary | ICD-10-CM

## 2023-05-03 NOTE — Progress Notes (Signed)
[address]       [name] [address]             [phone]     HPI: Mr. Gregory [name] returns to discuss the results of his bronchoscopy.  Gregory [name] is an 89 year old man with a history of remote tobacco use, hypertension, hyperlipidemia, right MCA stroke with minimal residual, non-ST elevation MI, CAD, AAA, stage IIIa chronic kidney disease, carotid disease, carotid stenting, and a right upper lobe lung nodule.  He presented with chest pain in January.  Catheterization showed mild CAD.  CT of the chest showed a 1.5 cm spiculated right upper lobe lung nodule.  He went home but returned a day later with recurrent chest pain and ruled in for non-ST elevation MI.  Repeat catheterization showed a 70 to 80% LAD lesion.  He has been treated medically.  Follow-up CT showed a stable to slightly increased lung nodule.  PET/CT showed it was hypermetabolic with an SUV of 6.  I did a navigational bronchoscopy with biopsies on 04/27/2023.  Biopsy showed some inflammation and macrophages but no definite granulomas.  No malignancy was seen.  Zubrod Score: At the time of surgery this patient's most appropriate activity status/level should be described as: [x]     0    Normal activity, no symptoms []     1    Restricted in physical strenuous activity but ambulatory, able to do out light work []     2    Ambulatory and capable of self care, unable to do work activities, up and about >50 % of waking hours                              []     3    Only limited self care, in bed greater than 50% of waking hours []     4    Completely disabled, no self care, confined to bed or chair []     5    Moribund  Past Medical History:  Diagnosis Date   Abdominal aneurysm (HCC)    Anginal pain (HCC)    Depression    History of kidney stones    Hyperlipidemia    Hypertension    Myocardial infarction (HCC) 04/19/2023   NSTEMI   Stroke [facility]) 2004?   TIA   Past Surgical History:  Procedure Laterality  Date   COLON SURGERY     polyps removed   COLONOSCOPY     CORONARY ANGIOGRAPHY N/A 12/29/2022   Procedure: CORONARY ANGIOGRAPHY;  Surgeon: Runell Gess, MD;  Location: [facility] INVASIVE CV LAB;  Service: Cardiovascular;  Laterality: N/A;   EYE SURGERY     cataract right   IR ANGIO INTRA EXTRACRAN SEL COM CAROTID INNOMINATE BILAT MOD SED  02/28/2018   IR ANGIO VERTEBRAL SEL SUBCLAVIAN INNOMINATE UNI R MOD SED  02/28/2018   IR RADIOLOGIST EVAL & MGMT  02/12/2021   LEFT HEART CATH AND CORONARY ANGIOGRAPHY N/A 12/26/2022   Procedure: LEFT HEART CATH AND CORONARY ANGIOGRAPHY;  Surgeon: Runell Gess, MD;  Location: [facility] INVASIVE CV LAB;  Service: Cardiovascular;  Laterality: N/A;   OTHER SURGICAL HISTORY     Carotid    RADIOLOGY WITH ANESTHESIA N/A 02/28/2018   Procedure: STENTING;  Surgeon: Julieanne Cotton, MD;  Location: [facility] OR;  Service: Radiology;  Laterality: N/A;   VIDEO BRONCHOSCOPY WITH ENDOBRONCHIAL NAVIGATION N/A 04/27/2023   Procedure: VIDEO BRONCHOSCOPY WITH  ENDOBRONCHIAL NAVIGATION W/ BIOPSIES;  Surgeon: [name], MD;  Location: [facility] OR;  Service: Thoracic;  Laterality: N/A;      Current Outpatient Medications  Medication Sig Dispense Refill   ALPRAZolam (XANAX) 0.5 MG tablet Take 0.5 mg by mouth every evening. Take 0.5 mg in the morning and 1 mg at bedtime     amLODipine (NORVASC) 5 MG tablet Take 5 mg by mouth daily.     aspirin 81 MG tablet Take 81 mg by mouth daily.     atorvastatin (LIPITOR) 80 MG tablet Take 1 tablet (80 mg total) by mouth daily. 90 tablet 3   clopidogrel (PLAVIX) 75 MG tablet Take 75 mg by mouth daily.     isosorbide mononitrate (IMDUR) 30 MG 24 hr tablet Take 0.5 tablets (15 mg total) by mouth daily. 30 tablet 5   mirtazapine (REMERON) 7.5 MG tablet TAKE 1 TABLET(7.5 MG) BY MOUTH AT BEDTIME 90 tablet 1   Multiple Vitamins-Minerals (CENTRUM ADULTS PO) Take 1 tablet by mouth daily.      nitroGLYCERIN (NITROSTAT) 0.4 MG SL tablet Place 1 tablet (0.4  mg total) under the tongue every 5 (five) minutes as needed for chest pain (up to 3 doses. If taking 3rd dose, call 911). 25 tablet 3   Omega-3 Fatty Acids (FISH OIL PO) Take 1 capsule by mouth daily.     sertraline (ZOLOFT) 50 MG tablet TAKE 3 TABLETS(150 MG) BY MOUTH DAILY (Patient taking differently: Take 150 mg by mouth daily.) 270 tablet 1   UNABLE TO FIND Take 1 tablet by mouth daily. Bone supplement     No current facility-administered medications for this visit.    Physical Exam BP 104/64   Pulse 67   Resp 20   Ht 6' (1.829 m)   Wt 185 lb (83.9 kg)   SpO2 94% Comment: RA  BMI 25.66 kg/m  89 year old man in no acute distress Alert and oriented x 3 with no focal motor deficits Hard of hearing No cervical or supraclavicular adenopathy Lungs clear bilaterally Cardiac regular rate and rhythm with 2/6 systolic murmur No clubbing cyanosis or edema  Diagnostic Tests: NUCLEAR MEDICINE PET SKULL BASE TO THIGH   TECHNIQUE: 9.2 mCi F-18 FDG was injected intravenously. Full-ring PET imaging was performed from the skull base to thigh after the radiotracer. CT data was obtained and used for attenuation correction and anatomic localization.   Fasting blood glucose: 111 mg/dl   COMPARISON:  Chest CT 03/20/2023   FINDINGS: Mediastinal blood pool activity: SUV max 2.36   Liver activity: SUV max NA   NECK: No hypermetabolic lymph nodes in the neck.   Incidental CT findings: Bilateral carotid artery calcifications.   CHEST: 12 mm right upper lobe pulmonary lesion is hypermetabolic with SUV max of 6.19 and consistent with primary lung neoplasm.   No enlarged or hypermetabolic mediastinal or hilar lymph nodes to suggest metastatic adenopathy. No supraclavicular or axillary adenopathy. No other pulmonary nodules to suggest pulmonary metastatic disease.   Incidental CT findings: Stable underlying emphysematous changes and pulmonary scarring. Stable aortic and three-vessel  coronary artery disease.   ABDOMEN/PELVIS: No abnormal hypermetabolic activity within the liver, pancreas, adrenal glands, or spleen. No hypermetabolic lymph nodes in the abdomen or pelvis.   Incidental CT findings: 4.2 cm infrarenal abdominal aortic aneurysm unchanged since abdominal CT scan 12/23/2022. Recommend follow-up every 12 months and vascular consultation. This recommendation follows ACR consensus guidelines: White Paper of the ACR Incidental Findings Committee II on Vascular Findings.  J Am Coll Radiol 2013; 10:789-794. sigmoid colon diverticulosis. Simple left renal cyst not requiring any further imaging evaluation or follow-up. Uniformly thick walled bladder could be due to lack of distension. Mild prostate gland enlargement.   SKELETON: No focal hypermetabolic activity to suggest skeletal metastasis.   Incidental CT findings: None.   IMPRESSION: 1. 12 mm hypermetabolic right upper lobe pulmonary lesion consistent with primary lung neoplasm. No findings for metastatic disease involving the neck, chest, abdomen/pelvis or bony structures. 2. 4.2 cm infrarenal abdominal aortic aneurysm. 3. Stable emphysematous changes, pulmonary scarring and vascular disease.     Electronically Signed   By: Rudie Meyer M.D.   On: 04/28/2023 10:11 I again reviewed the CT and PET/CT images.  Approximately 12 mm nodule in the right upper lobe which is hypermetabolic.  Pulmonary function testing FVC 3.93 (95%) FEV1 2.59 (89%) DLCO 15.02 (59%)  Impression: Gregory [name] is an 89 year old man with a history of remote tobacco use, hypertension, hyperlipidemia, right MCA stroke with minimal residual, non-ST elevation MI, CAD, AAA, stage IIIa chronic kidney disease, carotid disease, carotid stenting, and a right upper lobe lung nodule.    Right upper lobe lung nodule-first noted earlier this year when he presented with chest pain.  On follow-up it was slightly larger.  Hypermetabolic on  PET/CT.  Concerning for primary bronchogenic carcinoma.  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  I did a navigational bronchoscopy biopsy.  Unfortunately it was not definitive either way.  There were inflammatory cells and macrophages but no definite granulomas.  There also was no definite malignancy.  I had a long discussion with Mr. and Mrs. Keisler regarding the findings.  They understand that the bronchoscopy results do not rule out the possibility of cancer.  Given that there were no granulomas either I think we have to consider this high likelihood to be a primary bronchogenic carcinoma.  We discussed the options of empiric radiation versus surgical resection.  I discussed with him the possibility of doing a robotic right VATS for wedge resection and then right upper lobectomy if this turns out to be cancerous.  I informed him of the general nature of the procedure including the need for general anesthesia, the incisions to be used, the use of the surgical robot, the use of a drainage tube postoperatively, the expected hospital stay, and the overall recovery.  I informed him of the indications, risks, benefits, and alternatives.  He understands the risks include, but not limited to death, MI, DVT, PE, bleeding, possible need for transfusion, infection, prolonged air leak, cardiac arrhythmias, as well as the possibility of other unforeseeable complications.  He wishes to proceed with surgical resection.  Plan: Will call to schedule robotic assisted right upper lobe wedge resection and possible lobectomy  [name], MD [facility] [phone]

## 2023-05-05 ENCOUNTER — Other Ambulatory Visit: Payer: Self-pay | Admitting: *Deleted

## 2023-05-05 ENCOUNTER — Encounter: Payer: Self-pay | Admitting: *Deleted

## 2023-05-05 DIAGNOSIS — R911 Solitary pulmonary nodule: Secondary | ICD-10-CM

## 2023-05-08 ENCOUNTER — Encounter: Payer: Self-pay | Admitting: *Deleted

## 2023-05-08 LAB — CYTOLOGY - NON PAP

## 2023-05-08 NOTE — Pre-Procedure Instructions (Signed)
Surgical Instructions    Your procedure is scheduled on May 11, 2023.  Report to [facility] Main Entrance "A" at 5:30 A.M., then check in with the Admitting office.  Call this number if you have problems the morning of surgery:  [phone]  If you have any questions prior to your surgery date call [phone]: Open Monday-Friday 8am-4pm If you experience any cold or flu symptoms such as cough, fever, chills, shortness of breath, etc. between now and your scheduled surgery, please notify us at the above number.     Remember:  Do not eat or drink after midnight the night before your surgery     Take these medicines the morning of surgery with A SIP OF WATER:  ALPRAZolam (XANAX)   amLODipine (NORVASC)   atorvastatin (LIPITOR)   isosorbide mononitrate (IMDUR)   sertraline (ZOLOFT)   nitroGLYCERIN (NITROSTAT) - may take if needed   STOP taking your clopidogrel (PLAVIX) and FISH OIL five days prior to surgery. Your last dose will be May 17th.   As of today, STOP taking any Aleve, Naproxen, Ibuprofen, Motrin, Advil, Goody's, BC's, all herbal medications, and all vitamins.                     Do NOT Smoke (Tobacco/Vaping) for 24 hours prior to your procedure.  If you use a CPAP at night, you may bring your mask/headgear for your overnight stay.   Contacts, glasses, piercing's, hearing aid's, dentures or partials may not be worn into surgery, please bring cases for these belongings.    For patients admitted to the hospital, discharge time will be determined by your treatment team.   Patients discharged the day of surgery will not be allowed to drive home, and someone needs to stay with them for 24 hours.  SURGICAL WAITING ROOM VISITATION Patients having surgery or a procedure may have no more than 2 support people in the waiting area - these visitors may rotate.   Children under the age of 74 must have an adult with them who is not the patient. If the patient needs to stay at  the hospital during part of their recovery, the visitor guidelines for inpatient rooms apply. Pre-op nurse will coordinate an appropriate time for 1 support person to accompany patient in pre-op.  This support person may not rotate.   Please refer to the [facility] website for the visitor guidelines for Inpatients (after your surgery is over and you are in a regular room).    Special instructions:   [place]- Preparing For Surgery  Before surgery, you can play an important role. Because skin is not sterile, your skin needs to be as free of germs as possible. You can reduce the number of germs on your skin by washing with CHG (chlorahexidine gluconate) Soap before surgery.  CHG is an antiseptic cleaner which kills germs and bonds with the skin to continue killing germs even after washing.    Oral Hygiene is also important to reduce your risk of infection.  Remember - BRUSH YOUR TEETH THE MORNING OF SURGERY WITH YOUR REGULAR TOOTHPASTE  Please do not use if you have an allergy to CHG or antibacterial soaps. If your skin becomes reddened/irritated stop using the CHG.  Do not shave (including legs and underarms) for at least 48 hours prior to first CHG shower. It is OK to shave your face.  Please follow these instructions carefully.   Shower the NIGHT BEFORE SURGERY and the MORNING OF SURGERY  If you chose to wash your hair, face and private are, wash your first as usual with your normal shampoo/soap.  Then rinse your hair and body thoroughly to remove the shampoo/soap.  Use CHG Soap as you would any other liquid soap. You can apply CHG directly to the skin and wash gently with a scrungie or a clean washcloth.   Apply the CHG Soap to your body ONLY FROM THE NECK DOWN.  Do not use on open wounds or open sores. Avoid contact with your eyes, ears, mouth and genitals (private parts). Wash Face and genitals (private parts) with your normal soap.   Wash thoroughly, paying special attention to  the area where your surgery will be performed.  Thoroughly rinse your body with warm water from the neck down.  DO NOT shower/wash with your normal soap after using and rinsing off the CHG Soap.  Pat yourself dry with a CLEAN TOWEL.  Wear CLEAN PAJAMAS to bed the night before surgery  Place CLEAN SHEETS on your bed the night before your surgery  DO NOT SLEEP WITH PETS.   Day of Surgery: Take a shower with CHG soap. Do not wear jewelry or makeup Do not wear lotions, powders, perfumes/colognes, or deodorant. Do not shave 48 hours prior to surgery.  Men may shave face and neck. Do not bring valuables to the hospital.  [facility] is not responsible for any belongings or valuables. Do not wear nail polish, gel polish, artificial nails, or any other type of covering on natural nails (fingers and toes) If you have artificial nails or gel coating that need to be removed by a nail salon, please have this removed prior to surgery. Artificial nails or gel coating may interfere with anesthesia's ability to adequately monitor your vital signs. Wear Clean/Comfortable clothing the morning of surgery Remember to brush your teeth WITH YOUR REGULAR TOOTHPASTE.   Please read over the following fact sheets that you were given.    If you received a COVID test during your pre-op visit  it is requested that you wear a mask when out in public, stay away from anyone that may not be feeling well and notify your surgeon if you develop symptoms. If you have been in contact with anyone that has tested positive in the last 10 days please notify you surgeon.

## 2023-05-09 ENCOUNTER — Inpatient Hospital Stay (HOSPITAL_COMMUNITY)
Admission: RE | Admit: 2023-05-09 | Discharge: 2023-05-09 | Disposition: A | Discharge status: Home / Self Care | Payer: PPO | Source: Ambulatory Visit

## 2023-05-10 ENCOUNTER — Ambulatory Visit: Payer: PPO | Admitting: Cardiology

## 2023-05-11 ENCOUNTER — Encounter: Payer: Self-pay | Admitting: Thoracic Surgery (Cardiothoracic Vascular Surgery)

## 2023-05-12 NOTE — Progress Notes (Signed)
Surgical Instructions    Your procedure is scheduled on Wednesday May 17, 2023.  Report to [facility] Main Entrance "A" at 9:50 A.M., then check in with the Admitting office.  Call this number if you have problems the morning of surgery:  [phone]   If you have any questions prior to your surgery date call [phone]: Open Monday-Friday 8am-4pm If you experience any cold or flu symptoms such as cough, fever, chills, shortness of breath, etc. between now and your scheduled surgery, please notify us at the above number     Remember:  Do not eat or drink after midnight the night before your surgery   Take these medicines the morning of surgery with A SIP OF WATER:  ALPRAZolam (XANAX)  amLODipine (NORVASC)  atorvastatin (LIPITOR)  isosorbide mononitrate (IMDUR)  sertraline (ZOLOFT)   Follow your surgeon's instructions on when to stop Aspirin and Plavix  If no instructions were given by your surgeon then you will need to call the office to get those instructions.    As of today, STOP taking any (unless otherwise instructed by your surgeon) Aleve, Naproxen, Ibuprofen, Motrin, Advil, Goody's, BC's, all herbal medications, fish oil, and all vitamins.  Special instructions:    Oral Hygiene is also important to reduce your risk of infection.  Remember - BRUSH YOUR TEETH THE MORNING OF SURGERY WITH YOUR REGULAR TOOTHPASTE   [place]- Preparing For Surgery  Before surgery, you can play an important role. Because skin is not sterile, your skin needs to be as free of germs as possible. You can reduce the number of germs on your skin by washing with CHG (chlorahexidine gluconate) Soap before surgery.  CHG is an antiseptic cleaner which kills germs and bonds with the skin to continue killing germs even after washing.     Please do not use if you have an allergy to CHG or antibacterial soaps. If your skin becomes reddened/irritated stop using the CHG.  Do not shave (including legs and  underarms) for at least 48 hours prior to first CHG shower. It is OK to shave your face.  Please follow these instructions carefully.     Shower the NIGHT BEFORE SURGERY and the MORNING OF SURGERY with CHG Soap.   If you chose to wash your hair, wash your hair first as usual with your normal shampoo. After you shampoo, rinse your hair and body thoroughly to remove the shampoo.  Then [employer] and genitals (private parts) with your normal soap and rinse thoroughly to remove soap.  After that Use CHG Soap as you would any other liquid soap. You can apply CHG directly to the skin and wash gently with a scrungie or a clean washcloth.   Apply the CHG Soap to your body ONLY FROM THE NECK DOWN.  Do not use on open wounds or open sores. Avoid contact with your eyes, ears, mouth and genitals (private parts). Wash Face and genitals (private parts)  with your normal soap.   Wash thoroughly, paying special attention to the area where your surgery will be performed.  Thoroughly rinse your body with warm water from the neck down.  DO NOT shower/wash with your normal soap after using and rinsing off the CHG Soap.  Pat yourself dry with a CLEAN TOWEL.  Wear CLEAN PAJAMAS to bed the night before surgery  Place CLEAN SHEETS on your bed the night before your surgery  DO NOT SLEEP WITH PETS.   Day of Surgery:  Take a shower  with CHG soap. Wear Clean/Comfortable clothing the morning of surgery Do not apply any deodorants/lotions.   Remember to brush your teeth WITH YOUR REGULAR TOOTHPASTE.  Do not wear jewelry or makeup. Do not wear lotions, powders, perfumes/cologne or deodorant. Do not shave 48 hours prior to surgery.  Men may shave face and neck. Do not bring valuables to the hospital. Do not wear nail polish, gel polish, artificial nails, or any other type of covering on natural nails (fingers and toes) If you have artificial nails or gel coating that need to be removed by a nail salon, please  have this removed prior to surgery. Artificial nails or gel coating may interfere with anesthesia's ability to adequately monitor your vital signs.  [facility] is not responsible for any belongings or valuables.    Do NOT Smoke (Tobacco/Vaping)  24 hours prior to your procedure  If you use a CPAP at night, you may bring your mask for your overnight stay.   Contacts, glasses, hearing aids, dentures or partials may not be worn into surgery, please bring cases for these belongings   For patients admitted to the hospital, discharge time will be determined by your treatment team.   Patients discharged the day of surgery will not be allowed to drive home, and someone needs to stay with them for 24 hours.   SURGICAL WAITING ROOM VISITATION Patients having surgery or a procedure may have no more than 2 support people in the waiting area - these visitors may rotate.   Children under the age of 67 must have an adult with them who is not the patient. If the patient needs to stay at the hospital during part of their recovery, the visitor guidelines for inpatient rooms apply. Pre-op nurse will coordinate an appropriate time for 1 support person to accompany patient in pre-op.  This support person may not rotate.   Please refer to [email or web address] for the visitor guidelines for Inpatients (after your surgery is over and you are in a regular room).   If you received a COVID test during your pre-op visit, it is requested that you wear a mask when out in public, stay away from anyone that may not be feeling well, and notify your surgeon if you develop symptoms. If you have been in contact with anyone that has tested positive in the last 10 days, please notify your surgeon.    Please read over the following fact sheets that you were given.

## 2023-05-15 ENCOUNTER — Emergency Department (HOSPITAL_BASED_OUTPATIENT_CLINIC_OR_DEPARTMENT_OTHER): Payer: PPO

## 2023-05-15 ENCOUNTER — Other Ambulatory Visit: Payer: Self-pay

## 2023-05-15 ENCOUNTER — Encounter (HOSPITAL_BASED_OUTPATIENT_CLINIC_OR_DEPARTMENT_OTHER): Payer: Self-pay | Admitting: Emergency Medicine

## 2023-05-15 ENCOUNTER — Inpatient Hospital Stay (HOSPITAL_BASED_OUTPATIENT_CLINIC_OR_DEPARTMENT_OTHER)
Admission: EM | Admit: 2023-05-15 | Discharge: 2023-05-18 | DRG: 282 | Disposition: A | Discharge status: Home / Self Care | Payer: PPO | Attending: Cardiology | Admitting: Cardiology

## 2023-05-15 DIAGNOSIS — Z7982 Long term (current) use of aspirin: Secondary | ICD-10-CM

## 2023-05-15 DIAGNOSIS — Z7902 Long term (current) use of antithrombotics/antiplatelets: Secondary | ICD-10-CM | POA: Diagnosis not present

## 2023-05-15 DIAGNOSIS — I714 Abdominal aortic aneurysm, without rupture, unspecified: Secondary | ICD-10-CM | POA: Diagnosis present

## 2023-05-15 DIAGNOSIS — Z951 Presence of aortocoronary bypass graft: Secondary | ICD-10-CM

## 2023-05-15 DIAGNOSIS — I10 Essential (primary) hypertension: Secondary | ICD-10-CM | POA: Diagnosis present

## 2023-05-15 DIAGNOSIS — I25119 Atherosclerotic heart disease of native coronary artery with unspecified angina pectoris: Secondary | ICD-10-CM | POA: Diagnosis not present

## 2023-05-15 DIAGNOSIS — N183 Chronic kidney disease, stage 3 unspecified: Secondary | ICD-10-CM | POA: Diagnosis not present

## 2023-05-15 DIAGNOSIS — Z87891 Personal history of nicotine dependence: Secondary | ICD-10-CM

## 2023-05-15 DIAGNOSIS — Z9841 Cataract extraction status, right eye: Secondary | ICD-10-CM | POA: Diagnosis not present

## 2023-05-15 DIAGNOSIS — I200 Unstable angina: Secondary | ICD-10-CM | POA: Diagnosis present

## 2023-05-15 DIAGNOSIS — Z8673 Personal history of transient ischemic attack (TIA), and cerebral infarction without residual deficits: Secondary | ICD-10-CM | POA: Diagnosis not present

## 2023-05-15 DIAGNOSIS — I214 Non-ST elevation (NSTEMI) myocardial infarction: Secondary | ICD-10-CM | POA: Diagnosis present

## 2023-05-15 DIAGNOSIS — N1831 Chronic kidney disease, stage 3a: Secondary | ICD-10-CM | POA: Diagnosis present

## 2023-05-15 DIAGNOSIS — I779 Disorder of arteries and arterioles, unspecified: Secondary | ICD-10-CM | POA: Diagnosis present

## 2023-05-15 DIAGNOSIS — R001 Bradycardia, unspecified: Secondary | ICD-10-CM | POA: Diagnosis present

## 2023-05-15 DIAGNOSIS — I358 Other nonrheumatic aortic valve disorders: Secondary | ICD-10-CM | POA: Diagnosis present

## 2023-05-15 DIAGNOSIS — I129 Hypertensive chronic kidney disease with stage 1 through stage 4 chronic kidney disease, or unspecified chronic kidney disease: Secondary | ICD-10-CM | POA: Diagnosis present

## 2023-05-15 DIAGNOSIS — N179 Acute kidney failure, unspecified: Secondary | ICD-10-CM | POA: Diagnosis not present

## 2023-05-15 DIAGNOSIS — I25110 Atherosclerotic heart disease of native coronary artery with unstable angina pectoris: Secondary | ICD-10-CM | POA: Diagnosis present

## 2023-05-15 DIAGNOSIS — Z01810 Encounter for preprocedural cardiovascular examination: Secondary | ICD-10-CM | POA: Diagnosis not present

## 2023-05-15 DIAGNOSIS — D631 Anemia in chronic kidney disease: Secondary | ICD-10-CM | POA: Diagnosis present

## 2023-05-15 DIAGNOSIS — Z87442 Personal history of urinary calculi: Secondary | ICD-10-CM | POA: Diagnosis not present

## 2023-05-15 DIAGNOSIS — R911 Solitary pulmonary nodule: Secondary | ICD-10-CM | POA: Diagnosis present

## 2023-05-15 DIAGNOSIS — I209 Angina pectoris, unspecified: Secondary | ICD-10-CM | POA: Diagnosis present

## 2023-05-15 DIAGNOSIS — E785 Hyperlipidemia, unspecified: Secondary | ICD-10-CM | POA: Diagnosis present

## 2023-05-15 DIAGNOSIS — I252 Old myocardial infarction: Secondary | ICD-10-CM

## 2023-05-15 DIAGNOSIS — I2510 Atherosclerotic heart disease of native coronary artery without angina pectoris: Secondary | ICD-10-CM | POA: Diagnosis present

## 2023-05-15 DIAGNOSIS — F3181 Bipolar II disorder: Secondary | ICD-10-CM

## 2023-05-15 DIAGNOSIS — Z79899 Other long term (current) drug therapy: Secondary | ICD-10-CM

## 2023-05-15 DIAGNOSIS — Z8249 Family history of ischemic heart disease and other diseases of the circulatory system: Secondary | ICD-10-CM | POA: Diagnosis not present

## 2023-05-15 DIAGNOSIS — I2584 Coronary atherosclerosis due to calcified coronary lesion: Secondary | ICD-10-CM | POA: Diagnosis present

## 2023-05-15 DIAGNOSIS — F32A Depression, unspecified: Secondary | ICD-10-CM | POA: Diagnosis present

## 2023-05-15 LAB — TROPONIN I (HIGH SENSITIVITY)
Troponin I (High Sensitivity): 424 ng/L (ref <18)
Troponin I (High Sensitivity): 880 ng/L (ref <18)

## 2023-05-15 LAB — CBC WITH DIFFERENTIAL/PLATELET
Abs Immature Granulocytes: 0.01 10*3/uL (ref 0.00–0.07)
Basophils Absolute: 0 10*3/uL (ref 0.0–0.1)
Basophils Relative: 1 %
Eosinophils Absolute: 0.1 10*3/uL (ref 0.0–0.5)
Eosinophils Relative: 2 %
HCT: 38.5 % — ABNORMAL LOW (ref 39.0–52.0)
Hemoglobin: 13.2 g/dL (ref 13.0–17.0)
Immature Granulocytes: 0 %
Lymphocytes Relative: 21 %
Lymphs Abs: 1.3 10*3/uL (ref 0.7–4.0)
MCH: 30.3 pg (ref 26.0–34.0)
MCHC: 34.3 g/dL (ref 30.0–36.0)
MCV: 88.5 fL (ref 80.0–100.0)
Monocytes Absolute: 0.8 10*3/uL (ref 0.1–1.0)
Monocytes Relative: 13 %
Neutro Abs: 3.9 10*3/uL (ref 1.7–7.7)
Neutrophils Relative %: 63 %
Platelets: 163 10*3/uL (ref 150–400)
RBC: 4.35 MIL/uL (ref 4.22–5.81)
RDW: 12.7 % (ref 11.5–15.5)
WBC: 6.2 10*3/uL (ref 4.0–10.5)
nRBC: 0 % (ref 0.0–0.2)

## 2023-05-15 LAB — BASIC METABOLIC PANEL
Anion gap: 10 (ref 5–15)
BUN: 16 mg/dL (ref 8–23)
CO2: 25 mmol/L (ref 22–32)
Calcium: 9.7 mg/dL (ref 8.9–10.3)
Chloride: 104 mmol/L (ref 98–111)
Creatinine, Ser: 1.13 mg/dL (ref 0.61–1.24)
GFR, Estimated: >60 mL/min (ref >60)
Glucose, Bld: 206 mg/dL — ABNORMAL HIGH (ref 70–99)
Potassium: 4.1 mmol/L (ref 3.5–5.1)
Sodium: 139 mmol/L (ref 135–145)

## 2023-05-15 MED ORDER — ASPIRIN 81 MG PO CHEW
324.0000 mg | CHEWABLE_TABLET | Freq: Once | ORAL | Status: AC
  Administered 2023-05-15: 324 mg via ORAL
  Filled 2023-05-15: qty 4

## 2023-05-15 MED ORDER — HEPARIN BOLUS VIA INFUSION
4000.0000 [IU] | Freq: Once | INTRAVENOUS | Status: AC
  Administered 2023-05-15: 4000 [IU] via INTRAVENOUS

## 2023-05-15 MED ORDER — ONDANSETRON HCL 4 MG/2ML IJ SOLN: 4.0000 mg | Freq: Four times a day (QID) | INTRAMUSCULAR | Status: DC | PRN

## 2023-05-15 MED ORDER — NITROGLYCERIN 0.4 MG SL SUBL
0.4000 mg | SUBLINGUAL_TABLET | SUBLINGUAL | Status: DC | PRN
  Administered 2023-05-16 (×3): 0.4 mg via SUBLINGUAL
  Filled 2023-05-15 (×3): qty 1

## 2023-05-15 MED ORDER — SERTRALINE HCL 50 MG PO TABS
150.0000 mg | ORAL_TABLET | Freq: Every day | ORAL | Status: DC
  Administered 2023-05-16 – 2023-05-18 (×3): 150 mg via ORAL
  Filled 2023-05-15 (×3): qty 1

## 2023-05-15 MED ORDER — SODIUM CHLORIDE 0.9 % IV BOLUS
500.0000 mL | Freq: Once | INTRAVENOUS | Status: AC
  Administered 2023-05-15: 500 mL via INTRAVENOUS

## 2023-05-15 MED ORDER — NITROGLYCERIN 0.4 MG SL SUBL: 0.4000 mg | SUBLINGUAL_TABLET | SUBLINGUAL | Status: DC | PRN

## 2023-05-15 MED ORDER — ISOSORBIDE MONONITRATE ER 30 MG PO TB24
15.0000 mg | ORAL_TABLET | Freq: Every day | ORAL | Status: DC
  Administered 2023-05-16: 15 mg via ORAL
  Filled 2023-05-15: qty 1

## 2023-05-15 MED ORDER — ASPIRIN 81 MG PO TABS: 81.0000 mg | ORAL_TABLET | Freq: Every day | ORAL | Status: DC

## 2023-05-15 MED ORDER — ATORVASTATIN CALCIUM 80 MG PO TABS
80.0000 mg | ORAL_TABLET | Freq: Every day | ORAL | Status: DC
  Administered 2023-05-16 – 2023-05-18 (×3): 80 mg via ORAL
  Filled 2023-05-15 (×3): qty 1

## 2023-05-15 MED ORDER — ACETAMINOPHEN 325 MG PO TABS
650.0000 mg | ORAL_TABLET | ORAL | Status: DC | PRN
  Filled 2023-05-15: qty 2

## 2023-05-15 MED ORDER — ALPRAZOLAM 0.5 MG PO TABS
0.5000 mg | ORAL_TABLET | Freq: Every day | ORAL | Status: DC
  Administered 2023-05-15 – 2023-05-17 (×3): 0.5 mg via ORAL
  Filled 2023-05-15 (×3): qty 1

## 2023-05-15 MED ORDER — MIRTAZAPINE 7.5 MG PO TABS
7.5000 mg | ORAL_TABLET | Freq: Every day | ORAL | Status: DC
  Administered 2023-05-15 – 2023-05-17 (×3): 7.5 mg via ORAL
  Filled 2023-05-15 (×4): qty 1

## 2023-05-15 MED ORDER — AMLODIPINE BESYLATE 5 MG PO TABS
5.0000 mg | ORAL_TABLET | Freq: Every day | ORAL | Status: DC
  Administered 2023-05-16 – 2023-05-18 (×3): 5 mg via ORAL
  Filled 2023-05-15 (×3): qty 1

## 2023-05-15 MED ORDER — NITROGLYCERIN 0.4 MG SL SUBL
0.4000 mg | SUBLINGUAL_TABLET | SUBLINGUAL | Status: AC | PRN
  Administered 2023-05-15 (×2): 0.4 mg via SUBLINGUAL
  Filled 2023-05-15: qty 1

## 2023-05-15 MED ORDER — HEPARIN (PORCINE) 25000 UT/250ML-% IV SOLN
1150.0000 [IU]/h | INTRAVENOUS | Status: DC
  Administered 2023-05-15: 1150 [IU]/h via INTRAVENOUS

## 2023-05-15 MED ORDER — NITROGLYCERIN IN D5W 200-5 MCG/ML-% IV SOLN: 0.0000 ug/min | INTRAVENOUS | Status: DC

## 2023-05-15 MED ORDER — ASPIRIN 81 MG PO TBEC
81.0000 mg | DELAYED_RELEASE_TABLET | Freq: Every day | ORAL | Status: DC
  Administered 2023-05-16 – 2023-05-18 (×3): 81 mg via ORAL
  Filled 2023-05-15 (×3): qty 1

## 2023-05-15 MED ORDER — TAMSULOSIN HCL 0.4 MG PO CAPS
0.4000 mg | ORAL_CAPSULE | Freq: Every day | ORAL | Status: DC
  Administered 2023-05-15 – 2023-05-17 (×3): 0.4 mg via ORAL
  Filled 2023-05-15 (×3): qty 1

## 2023-05-15 NOTE — ED Provider Notes (Signed)
[place] EMERGENCY DEPARTMENT AT [facility] Provider Note  CSN: [id number] Arrival date & time: 05/15/23 1505  Chief Complaint(s) Chest Pain  HPI Gregory [name] is a 89 y.o. male with past medical history as below, significant for unstable angina, HLD, HTN, TIA, CKD stage III, CAD, CVA, AAA who presents to the ED with complaint of chest pain.  Patient accompanied by family member.  Report of intermittent chest pain at rest over the past week, worsened today.  He said 2-3 episodes of chest pain over the past week that would resolve with nitroglycerin.  Pain would occur at rest, midsternal, sometimes radiate to his arms.  No diaphoresis, lightheadedness, nausea, vomiting, palpitations or dyspnea.  Began having another episode of the pain today around 12 PM, midsternal, described as Mild to moderate "pain" unable to characterize any further.  Did not take nitroglycerin today.  Symptoms began at rest, do improve with rest, not worse with exertion or deep inspiration.  Patient was scheduled for thoracic surgery on Wednesday but was canceled secondary to chest pain over the past week.  Dr. Dorris Fetch  Coronary angiography 12/29/22 with proximal LAD 80% stenosis, distal circumflex 50% stenosis, proximal LAD 2 mid LAD 50% stenosed LHC 12/26/22 distal circumflex 50% stenosis, proximal LAD to mid LAD 50% stenosed Echocardiogram 12/24/2022 with LVEF 50 to 55%, hypokinesis of left ventricular and anterolateral wall, anterior wall.  Mild LVH He was admitted for NSTEMI 1/24, discharged with medical management  Rollene Rotunda, MD  is primary cardiologist    Past Medical History Past Medical History:  Diagnosis Date   Abdominal aneurysm (HCC)    Anginal pain (HCC)    Depression    History of kidney stones    Hyperlipidemia    Hypertension    Myocardial infarction [facility]) 04/19/2023   NSTEMI   Stroke [facility]) 2004?   TIA   Patient Active Problem List   Diagnosis Date Noted   Dyslipidemia  02/28/2023   Stage 3a chronic kidney disease (CKD) (HCC) 12/30/2022   Hyperglycemia 12/30/2022   Lung nodule 12/30/2022   CAD (coronary artery disease) 12/30/2022   AAA (abdominal aortic aneurysm) without rupture (HCC) 12/30/2022   Carotid artery disease (HCC) 12/30/2022   NSTEMI (non-ST elevated myocardial infarction) (HCC) 12/29/2022   AKI (acute kidney injury) (HCC) 12/25/2022   Primary hypertension 12/25/2022   History of CVA (cerebrovascular accident) 12/25/2022   Unstable angina (HCC) 12/24/2022   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 01/02/2018   Tremor 06/14/2017   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/17/2013   Home Medication(s) Prior to Admission medications   Medication Sig Start Date End Date Taking? Authorizing Provider  ALPRAZolam Prudy Feeler) 0.5 MG tablet Take 0.5-1 mg by mouth See admin instructions. Take 0.5 mg in the morning and 1 mg at bedtime 03/17/17   [provider]  amLODipine (NORVASC) 5 MG tablet Take 5 mg by mouth daily.    [provider]  aspirin 81 MG tablet Take 81 mg by mouth daily.    [provider]  atorvastatin (LIPITOR) 80 MG tablet Take 1 tablet (80 mg total) by mouth daily. 12/27/22   Azalee Course, PA  clopidogrel (PLAVIX) 75 MG tablet Take 75 mg by mouth daily.    [provider]  isosorbide mononitrate (IMDUR) 30 MG 24 hr tablet Take 0.5 tablets (15 mg total) by mouth daily. 12/31/22   Dunn, Tacey Ruiz, PA-C  mirtazapine (REMERON) 7.5 MG tablet TAKE 1 TABLET(7.5 MG) BY MOUTH AT BEDTIME 04/23/23  Cottle, Steva Ready., MD  Multiple Vitamins-Minerals (CENTRUM ADULTS PO) Take 1 tablet by mouth daily.     [provider]  nitroGLYCERIN (NITROSTAT) 0.4 MG SL tablet Place 1 tablet (0.4 mg total) under the tongue every 5 (five) minutes as needed for chest pain (up to 3 doses. If taking 3rd dose, call 911). 12/30/22 12/30/23  Dunn, Tacey Ruiz, PA-C  Omega-3 Fatty Acids (FISH OIL PO) Take 1 capsule by mouth daily.    [provider]  sertraline (ZOLOFT) 50 MG tablet TAKE 3 TABLETS(150 MG) BY MOUTH DAILY Patient taking differently: Take 150 mg by mouth daily. 12/15/22   Cottle, Steva Ready., MD  tamsulosin (FLOMAX) 0.4 MG CAPS capsule Take 0.4 mg by mouth at bedtime. 05/03/23   [provider]  UNABLE TO FIND Take 1 tablet by mouth daily. Bone supplement    [provider]                                                                                                                                    Past Surgical History Past Surgical History:  Procedure Laterality Date   COLON SURGERY     polyps removed   COLONOSCOPY     CORONARY ANGIOGRAPHY N/A 12/29/2022   Procedure: CORONARY ANGIOGRAPHY;  Surgeon: Runell Gess, MD;  Location: [facility] INVASIVE CV LAB;  Service: Cardiovascular;  Laterality: N/A;   EYE SURGERY     cataract right   IR ANGIO INTRA EXTRACRAN SEL COM CAROTID INNOMINATE BILAT MOD SED  02/28/2018   IR ANGIO VERTEBRAL SEL SUBCLAVIAN INNOMINATE UNI R MOD SED  02/28/2018   IR RADIOLOGIST EVAL & MGMT  02/12/2021   LEFT HEART CATH AND CORONARY ANGIOGRAPHY N/A 12/26/2022   Procedure: LEFT HEART CATH AND CORONARY ANGIOGRAPHY;  Surgeon: Runell Gess, MD;  Location: [facility] INVASIVE CV LAB;  Service: Cardiovascular;  Laterality: N/A;   OTHER SURGICAL HISTORY     Carotid    RADIOLOGY WITH ANESTHESIA N/A 02/28/2018   Procedure: STENTING;  Surgeon: Julieanne Cotton, MD;  Location: [facility] OR;  Service: Radiology;  Laterality: N/A;   VIDEO BRONCHOSCOPY WITH ENDOBRONCHIAL NAVIGATION N/A 04/27/2023   Procedure: VIDEO BRONCHOSCOPY WITH ENDOBRONCHIAL NAVIGATION W/ BIOPSIES;  Surgeon: [name], MD;  Location: [facility] OR;  Service: Thoracic;  Laterality: N/A;   Family History Family History  Problem Relation Age of Onset   Cancer Mother        breast   Heart disease Mother    Heart disease Brother    Tremor Neg Hx     Social History Social History   Tobacco Use   Smoking status:  Former    Types: Cigarettes   Smokeless tobacco: Never  Vaping Use   Vaping Use: Never used  Substance Use Topics   Alcohol use: No   Drug use: No   Allergies Patient has no active allergies.  Review of Systems  Review of Systems  Constitutional:  Negative for chills and fever.  HENT:  Negative for facial swelling and trouble swallowing.   Eyes:  Negative for photophobia and visual disturbance.  Respiratory:  Negative for cough and shortness of breath.   Cardiovascular:  Positive for chest pain. Negative for palpitations.  Gastrointestinal:  Negative for abdominal pain, nausea and vomiting.  Endocrine: Negative for polydipsia and polyuria.  Genitourinary:  Negative for difficulty urinating and hematuria.  Musculoskeletal:  Positive for arthralgias. Negative for gait problem and joint swelling.  Skin:  Negative for pallor and rash.  Neurological:  Negative for syncope and headaches.  Psychiatric/Behavioral:  Negative for agitation and confusion.     Physical Exam Vital Signs  I have reviewed the triage vital signs BP 117/70   Pulse (!) 53   Temp 98.6 F (37 C)   Resp 12   Ht 6' (1.829 m)   Wt 83.9 kg   SpO2 92%   BMI 25.09 kg/m  Physical Exam Vitals and nursing note reviewed.  Constitutional:      General: He is not in acute distress.    Appearance: Normal appearance. He is well-developed.  HENT:     Head: Normocephalic and atraumatic.     Right Ear: External ear normal.     Left Ear: External ear normal.     Mouth/Throat:     Mouth: Mucous membranes are moist.  Eyes:     General: No scleral icterus. Cardiovascular:     Rate and Rhythm: Normal rate and regular rhythm.     Pulses: Normal pulses.     Heart sounds: Normal heart sounds.  Pulmonary:     Effort: Pulmonary effort is normal. No respiratory distress.     Breath sounds: Normal breath sounds.  Abdominal:     General: Abdomen is flat.     Palpations: Abdomen is soft.     Tenderness: There is no  abdominal tenderness. There is no guarding.  Musculoskeletal:        General: Normal range of motion.     Right lower leg: No edema.     Left lower leg: No edema.  Skin:    General: Skin is warm and dry.     Capillary Refill: Capillary refill takes less than 2 seconds.  Neurological:     Mental Status: He is alert and oriented to person, place, and time.     GCS: GCS eye subscore is 4. GCS verbal subscore is 5. GCS motor subscore is 6.     Comments: Hard of hearing  Psychiatric:        Mood and Affect: Mood normal.        Behavior: Behavior normal.     ED Results and Treatments Labs (all labs ordered are listed, but only abnormal results are displayed) Labs Reviewed  BASIC METABOLIC PANEL - Abnormal; Notable for the following components:      Result Value   Glucose, Bld 206 (*)    All other components within normal limits  CBC WITH DIFFERENTIAL/PLATELET - Abnormal; Notable for the following components:   HCT 38.5 (*)    All other components within normal limits  TROPONIN I (HIGH SENSITIVITY) - Abnormal; Notable for the following components:   Troponin I (High Sensitivity) 424 (*)    All other components within normal limits  TROPONIN I (HIGH SENSITIVITY) - Abnormal; Notable for the following components:   Troponin I (High Sensitivity) 880 (*)    All other components within normal  limits  HEPARIN LEVEL (UNFRACTIONATED)                                                                                                                          Radiology DG Chest Port 1 View  Result Date: 05/15/2023 CLINICAL DATA:  Chest pain. EXAM: PORTABLE CHEST 1 VIEW COMPARISON:  04/25/2023 radiograph and CT FINDINGS: Lower lung volumes from prior exam. The known right upper lobe pulmonary nodule is faintly visualized. Heterogeneous opacity in the lung bases. The heart is normal in size. Stable mediastinal contours. Aortic atherosclerosis. No pneumothorax or large pleural effusion. IMPRESSION:  Lower lung volumes from prior exam with heterogeneous opacity in the lung bases, may be atelectasis, pneumonia, or aspiration. Known right upper lobe nodule is faintly visualized. Electronically Signed   By: Narda Rutherford M.D.   On: 05/15/2023 16:09    Pertinent labs & imaging results that were available during my care of the patient were reviewed by me and considered in my medical decision making (see MDM for details).  Medications Ordered in ED Medications  heparin ADULT infusion 100 units/mL (25000 units/263mL) (1,150 Units/hr Intravenous New Bag/Given 05/15/23 1901)  nitroGLYCERIN 50 mg in dextrose 5 % 250 mL (0.2 mg/mL) infusion (has no administration in time range)  sodium chloride 0.9 % bolus 500 mL (500 mLs Intravenous New Bag/Given 05/15/23 1611)  aspirin chewable tablet 324 mg (324 mg Oral Given 05/15/23 1552)  nitroGLYCERIN (NITROSTAT) SL tablet 0.4 mg (0.4 mg Sublingual Given 05/15/23 1608)  heparin bolus via infusion 4,000 Units (4,000 Units Intravenous Bolus from Bag 05/15/23 1902)                                                                                                                                     Procedures .Critical Care  Performed by: Sloan Leiter, DO Authorized by: Sloan Leiter, DO   Critical care provider statement:    Critical care time (minutes):  70   Critical care time was exclusive of:  Separately billable procedures and treating other patients   Critical care was necessary to treat or prevent imminent or life-threatening deterioration of the following conditions:  Cardiac failure   Critical care was time spent personally by me on the following activities:  Development of treatment plan with patient or surrogate, discussions with consultants, evaluation of patient's response to treatment, examination of patient, ordering and review  of laboratory studies, ordering and review of radiographic studies, ordering and performing treatments and interventions,  pulse oximetry, re-evaluation of patient's condition, review of old charts and obtaining history from patient or surrogate   Care discussed with: admitting provider     (including critical care time)  Medical Decision Making / ED Course    Medical Decision Making:    ZAMEER AWWAD is a 89 y.o. male ith past medical history as below, significant for angina, HLD, HTN, TIA, CKD stage III, CAD, CVA AAA who presents to the ED with complaint of chest pain.  Patient accompanied by family member.  Report of intermittent chest pain at rest over the past week, worsened today. . The complaint involves an extensive differential diagnosis and also carries with it a high risk of complications and morbidity.  Serious etiology was considered. Ddx includes but is not limited to: Differential includes all life-threatening causes for chest pain. This includes but is not exclusive to acute coronary syndrome, aortic dissection, pulmonary embolism, cardiac tamponade, community-acquired pneumonia, pericarditis, musculoskeletal chest wall pain, etc.   Complete initial physical exam performed, notably the patient  was he is currently symptomatic, vital signs stable.    Reviewed and confirmed nursing documentation for past medical history, family history, social history.  Vital signs reviewed.    Clinical Course as of 05/15/23 1915  Mon May 15, 2023  1604 Pain now 4/10 following NTG, prior to NTG pain was 7/10 [SG]  1636 Troponin I (High Sensitivity)(!!): 424 Symptomatic on arrival, symptoms greatly improved following nitroglycerin.  Also given aspirin.  EKG without STEMI.  Patient with similar presentation in January of this year, troponins that time were 800+ [SG]  1835 Paged cardiology for admission [SG]    Clinical Course User Index [SG] Sloan Leiter, DO   Patient with intermittent chest pain over the past week, worsened in the past 24 hours.  Troponin initially elevated over 400, delta troponin greater  than 800.  Chest pain is still present but is more mild following nitroglycerin.  He is HDS.  EKG similar to prior, no overt STEMI.  Concern for unstable angina, would recommend admission.  Paged cardiology.  Rpt EKG does show slight elevation to V2 not seen on prior, will d/w cardiology  Did review prior cardiology office notes, Dr. Antoine Poche is his primary cardiologist.  He had prior LHC admission 1/10 that did not show prox LAD lesion in close proximity to aneurysmal segment.  Per cardiology consider CABG if symptoms do recur.  He received aspirin on arrival, heparin was started as well  Concern for unstable angina, troponin significantly elevated.  Heparin started, no contraindications to anticoagulation verbalized by patient.  Also given aspirin and nitroglycerin.  Discussed with cardiology Dr. Lalla Brothers recommend admission, continue heparin infusion, start nitroglycerin infusion.  Admit to cardiology.             Additional history obtained: -Additional history obtained from family -External records from outside source obtained and reviewed including: Chart review including previous notes, labs, imaging, consultation notes including primary care documentation, home medications, prior labs and imaging  Coronary angiography 12/29/22 with proximal LAD 80% stenosis, distal circumflex 50% stenosis, proximal LAD 2 mid LAD 50% stenosed LHC 12/26/22 distal circumflex 50% stenosis, proximal LAD to mid LAD 50% stenosed Echocardiogram 12/24/2022 with LVEF 50 to 55%, hypokinesis of left ventricular and anterolateral wall, anterior wall.  Mild LVH     Lab Tests: -I ordered, reviewed, and interpreted labs.   The pertinent  results include:   Labs Reviewed  BASIC METABOLIC PANEL - Abnormal; Notable for the following components:      Result Value   Glucose, Bld 206 (*)    All other components within normal limits  CBC WITH DIFFERENTIAL/PLATELET - Abnormal; Notable for the following components:    HCT 38.5 (*)    All other components within normal limits  TROPONIN I (HIGH SENSITIVITY) - Abnormal; Notable for the following components:   Troponin I (High Sensitivity) 424 (*)    All other components within normal limits  TROPONIN I (HIGH SENSITIVITY) - Abnormal; Notable for the following components:   Troponin I (High Sensitivity) 880 (*)    All other components within normal limits  HEPARIN LEVEL (UNFRACTIONATED)    Notable for trop+++  EKG   EKG Interpretation  Date/Time:  Monday May 15 2023 15:25:33 EDT Ventricular Rate:  63 PR Interval:  170 QRS Duration: 92 QT Interval:  440 QTC Calculation: 450 R Axis:   60 Text Interpretation: Normal sinus rhythm ST & T wave abnormality, consider inferolateral ischemia Abnormal ECG When compared with ECG of 30-Dec-2022 09:26, PR interval has decreased Incomplete right bundle branch block is no longer Present T wave inversion no longer evident in Anterior leads similar to prior tracing no stemi Confirmed by Tanda Rockers ([id number]) on 05/15/2023 3:29:11 PM         Imaging Studies ordered: I ordered imaging studies including CXR I independently visualized the following imaging with scope of interpretation limited to determining acute life threatening conditions related to emergency care; findings noted above, significant for no large ptx or large mass, stable I independently visualized and interpreted imaging. I agree with the radiologist interpretation   Medicines ordered and prescription drug management: Meds ordered this encounter  Medications   sodium chloride 0.9 % bolus 500 mL   aspirin chewable tablet 324 mg   nitroGLYCERIN (NITROSTAT) SL tablet 0.4 mg   heparin bolus via infusion 4,000 Units   heparin ADULT infusion 100 units/mL (25000 units/268mL)   nitroGLYCERIN 50 mg in dextrose 5 % 250 mL (0.2 mg/mL) infusion    -I have reviewed the patients home medicines and have made adjustments as needed   Consultations  Obtained: I requested consultation with the Cardiology,  and discussed lab and imaging findings as well as pertinent plan - they recommend: admit    Cardiac Monitoring: The patient was maintained on a cardiac monitor.  I personally viewed and interpreted the cardiac monitored which showed an underlying rhythm of: nsr  Social Determinants of Health:  Diagnosis or treatment significantly limited by social determinants of health: former smoker   Reevaluation: After the interventions noted above, I reevaluated the patient and found that they have improved  Co morbidities that complicate the patient evaluation  Past Medical History:  Diagnosis Date   Abdominal aneurysm (HCC)    Anginal pain (HCC)    Depression    History of kidney stones    Hyperlipidemia    Hypertension    Myocardial infarction (HCC) 04/19/2023   NSTEMI   Stroke [facility]) 2004?   TIA      Dispostion: Disposition decision including need for hospitalization was considered, and patient admitted to the hospital.    Final Clinical Impression(s) / ED Diagnoses Final diagnoses:  Unstable angina (HCC)     This chart was dictated using voice recognition software.  Despite best efforts to proofread,  errors can occur which can change the documentation meaning.    Tanda Rockers  A, DO 05/15/23 1916

## 2023-05-15 NOTE — ED Notes (Signed)
Patient remains chest pain free.  SBP 101  Nitro drip held at this time.  Dr. Wallace Cullens notified

## 2023-05-15 NOTE — Progress Notes (Signed)
ANTICOAGULATION CONSULT NOTE - Initial Consult  Pharmacy Consult for Heparin Indication: chest pain/ACS  No Active Allergies  Patient Measurements:   Heparin Dosing Weight: 83.9 kg  Vital Signs: Temp: 98.6 F (37 C) (05/27 1532) BP: 136/76 (05/27 1532) Pulse Rate: 61 (05/27 1532)  Labs: Recent Labs    05/15/23 1547 05/15/23 1735  HGB 13.2  --   HCT 38.5*  --   PLT 163  --   CREATININE 1.13  --   TROPONINIHS 424* 880*    Estimated Creatinine Clearance: 51.5 mL/min (by C-G formula based on SCr of 1.13 mg/dL).   Medical History: Past Medical History:  Diagnosis Date   Abdominal aneurysm (HCC)    Anginal pain (HCC)    Depression    History of kidney stones    Hyperlipidemia    Hypertension    Myocardial infarction [facility]) 04/19/2023   NSTEMI   Stroke [facility]) 2004?   TIA    Medications:  (Not in a hospital admission)  Scheduled:  Infusions:  PRN:   Assessment: 63 yom with a history of HLD, unstable angina, HTN, TIA, CKD, CAD, CVA, AAA. Patient is presenting with chest pain. Heparin per pharmacy consult placed for chest pain/ACS.  Patient is not on anticoagulation prior to arrival.  Hgb 13.2; plt 163  Goal of Therapy:  Heparin level 0.3-0.7 units/ml Monitor platelets by anticoagulation protocol: Yes   Plan:  Give IV heparin 4000 units bolus x 1 Start heparin infusion at 1150 units/hr Check anti-Xa level in 8 hours and daily while on heparin Continue to monitor H&H and platelets  Delmar Landau, PharmD, BCPS 05/15/2023 6:38 PM ED Clinical Pharmacist -  [phone]

## 2023-05-15 NOTE — ED Triage Notes (Signed)
Sharp central chest pain started around 12. Waxes and wanes 3 to 5 in intensity of pain. States this feels like when he had his heart attack

## 2023-05-15 NOTE — ED Notes (Signed)
Attempt to call report.  Floor nurse unavailable and to call back.  [facility] at bedside

## 2023-05-15 NOTE — H&P (Signed)
Cardiology Admission History and Physical   Patient ID: [name] MRN: [id number]; DOB: 24-Apr-1934   Admission date: 05/15/2023  PCP:  Gregory Banner, MD   [facility] Providers Cardiologist:  Rollene Rotunda, MD        Chief Complaint:  Chest Pain  Patient Profile:   Gregory [name] is a 89 y.o. 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  History of Present Illness:   Gregory [name] states when he woke up this morning he noticed chest pain at the center of his chest that he describes as a burning sensation that went to both arms.  The pain was not associated with any other symptoms such as shortness of breath, nausea, sweating, lightheadedness, or palpitations.  Due to ongoing pain, he had his son drive him to [facility] for evaluation. He states that his chest pain did eventually resolve after one hour and after he received nitroglycerin tablets.  He is now chest pain free.  He had similar episode of chest pain last week that resolve within a few minutes of taking a nitroglycerin tablet.  At [facility], patient had elevated HS-troponin levels that went from 424 ng/L to 880 ng/L.  He received high dose aspirin and placed on heparin drip prior to transfer to our hospital.  ECG performed was unchanged from prior and without signs of STEMI.  Of note, patient was hospitalized twice in January of this year for NSTEMI.  Based on coronary angiography note for his second study, there was 80% proximal LAD stenosis just before an aneurysmal segment.  It was decided to treat medically at the time  with consideration of coronary artery bypass if needed given concerns with stent placement at location.  He has been taking both aspirin and clopidogrel since January of this year.   Past Medical History:  Diagnosis Date   Abdominal aneurysm (HCC)    Anginal pain (HCC)    Depression    History of kidney stones    Hyperlipidemia    Hypertension    Myocardial infarction [facility]) 04/19/2023   NSTEMI   Stroke [facility]) 2004?   TIA    Past Surgical History:  Procedure Laterality Date   COLON SURGERY     polyps removed   COLONOSCOPY     CORONARY ANGIOGRAPHY N/A 12/29/2022   Procedure: CORONARY ANGIOGRAPHY;  Surgeon: Runell Gess, MD;  Location: [facility] INVASIVE CV LAB;  Service: Cardiovascular;  Laterality: N/A;   EYE SURGERY     cataract right   IR ANGIO INTRA EXTRACRAN SEL COM CAROTID INNOMINATE BILAT MOD SED  02/28/2018   IR ANGIO VERTEBRAL SEL SUBCLAVIAN INNOMINATE UNI R MOD SED  02/28/2018   IR RADIOLOGIST EVAL & MGMT  02/12/2021   LEFT HEART CATH AND CORONARY ANGIOGRAPHY N/A 12/26/2022   Procedure: LEFT HEART CATH AND CORONARY ANGIOGRAPHY;  Surgeon: Runell Gess, MD;  Location: [facility] INVASIVE CV LAB;  Service: Cardiovascular;  Laterality: N/A;   OTHER SURGICAL HISTORY     Carotid    RADIOLOGY WITH ANESTHESIA N/A 02/28/2018   Procedure: STENTING;  Surgeon: Julieanne Cotton, MD;  Location: [facility] OR;  Service: Radiology;  Laterality: N/A;   VIDEO  BRONCHOSCOPY WITH ENDOBRONCHIAL NAVIGATION N/A 04/27/2023   Procedure: VIDEO BRONCHOSCOPY WITH ENDOBRONCHIAL NAVIGATION W/ BIOPSIES;  Surgeon: [name], MD;  Location: [facility] OR;  Service: Thoracic;  Laterality: N/A;     Medications Prior to Admission: Prior to Admission medications   Medication Sig Start Date End Date Taking? Authorizing Provider  ALPRAZolam Prudy Feeler) 0.5 MG tablet Take 0.5-1 mg by mouth See admin instructions. Take 0.5 mg in the morning and 1 mg at bedtime 03/17/17   [provider]  amLODipine (NORVASC) 5 MG tablet  Take 5 mg by mouth daily.    [provider]  aspirin 81 MG tablet Take 81 mg by mouth daily.    [provider]  atorvastatin (LIPITOR) 80 MG tablet Take 1 tablet (80 mg total) by mouth daily. 12/27/22   Azalee Course, PA  clopidogrel (PLAVIX) 75 MG tablet Take 75 mg by mouth daily.    [provider]  isosorbide mononitrate (IMDUR) 30 MG 24 hr tablet Take 0.5 tablets (15 mg total) by mouth daily. 12/31/22   Dunn, Tacey Ruiz, PA-C  mirtazapine (REMERON) 7.5 MG tablet TAKE 1 TABLET(7.5 MG) BY MOUTH AT BEDTIME 04/23/23   Cottle, Steva Ready., MD  Multiple Vitamins-Minerals (CENTRUM ADULTS PO) Take 1 tablet by mouth daily.     [provider]  nitroGLYCERIN (NITROSTAT) 0.4 MG SL tablet Place 1 tablet (0.4 mg total) under the tongue every 5 (five) minutes as needed for chest pain (up to 3 doses. If taking 3rd dose, call 911). 12/30/22 12/30/23  Dunn, Tacey Ruiz, PA-C  Omega-3 Fatty Acids (FISH OIL PO) Take 1 capsule by mouth daily.    [provider]  sertraline (ZOLOFT) 50 MG tablet TAKE 3 TABLETS(150 MG) BY MOUTH DAILY Patient taking differently: Take 150 mg by mouth daily. 12/15/22   Cottle, Steva Ready., MD  tamsulosin (FLOMAX) 0.4 MG CAPS capsule Take 0.4 mg by mouth at bedtime. 05/03/23   [provider]  UNABLE TO FIND Take 1 tablet by mouth daily. Bone supplement    [provider]     Allergies:   No Known Allergies  Social History:   Social History   Socioeconomic History   Marital status: Married    Spouse name: Not on file   Number of children: 2   Years of education: Not on file   Highest education level: Some college, no degree  Occupational History   Not on file  Tobacco Use   Smoking status: Former    Types: Cigarettes   Smokeless tobacco: Never  Vaping Use   Vaping Use: Never used  Substance and Sexual Activity   Alcohol use: No   Drug use: No   Sexual activity: Yes  Other Topics Concern   Not on file  Social History  Narrative   Lives at home w/ his wife   Right-handed   Caffeine: occasional coffee and tea, decaf tea at home   Social Determinants of Health   Financial Resource Strain: Not on file  Food Insecurity: Not on file  Transportation Needs: Not on file  Physical Activity: Not on file  Stress: Not on file  Social Connections: Not on file  Intimate Partner Violence: Not on file    Family History:   The patient's family history includes Cancer in his mother; Heart disease in his brother and mother. There is no history of Tremor.    ROS:  Please see the history of present illness.  All other ROS reviewed  and negative.     Physical Exam/Data:   Vitals:   05/15/23 1915 05/15/23 1941 05/15/23 2025 05/15/23 2038  BP: 101/87   (!) 146/91  Pulse: (!) 55   73  Resp: 11   16  Temp:  98.1 F (36.7 C) 97.8 F (36.6 C) (!) 97.5 F (36.4 C)  TempSrc:  Oral Oral Oral  SpO2: 96%   98%  Weight:      Height:    6' (1.829 m)   No intake or output data in the 24 hours ending 05/15/23 2211    05/15/2023    6:38 PM 05/03/2023    3:41 PM 04/27/2023    9:43 AM  Last 3 Weights  Weight (lbs) 184 lb 15.5 oz 185 lb 190 lb  Weight (kg) 83.9 kg 83.915 kg 86.183 kg     Body mass index is 25.09 kg/m.  General:  Well nourished, well developed, in no acute distress HEENT: normal Neck: no JVD Vascular: No carotid bruits; Distal pulses 2+ bilaterally   Cardiac:  normal S1, S2; RRR; no murmur  Lungs:  clear to auscultation bilaterally, no wheezing, rhonchi or rales  Abd: soft, nontender, no hepatomegaly  Ext: no edema Musculoskeletal:  No deformities, BUE and BLE strength normal and equal Skin: warm and dry  Neuro:  CNs 2-12 intact, no focal abnormalities noted Psych:  Normal affect    EKG:  The ECG that was done 05/15/23 (16:46 hrs) was personally reviewed and demonstrates: NSR; inferolateral ST-depressions with T-wave inversion (cannot exclude ischemia versus repolarization changes), unchanged from  12/30/22.  Relevant CV Studies: # Coronary angiography 12/29/22:   Prox LAD to Mid LAD lesion is 50% stenosed.   Dist Cx lesion is 50% stenosed.   Prox LAD lesion is 80% stenosed.  # Coronary angiography with left heart catheterization 12/26/22:   Dist Cx lesion is 50% stenosed.   Prox LAD to Mid LAD lesion is 50% stenosed.    # Echocardiogram 12/24/22: 1. Left ventricular ejection fraction, by estimation, is 50 to 55%. The  left ventricle has low normal function. The left ventricle demonstrates  regional wall motion abnormalities (see scoring diagram/findings for  description). There is mild concentric  left ventricular hypertrophy. Left ventricular diastolic parameters are  indeterminate. There is mild hypokinesis of the left ventricular, entire  anterolateral wall and anterior wall.   2. Right ventricular systolic function is normal. The right ventricular  size is normal. There is normal pulmonary artery systolic pressure.   3. The mitral valve is grossly normal. Trivial mitral valve  regurgitation. No evidence of mitral stenosis.   4. The aortic valve is calcified. There is moderate calcification of the  aortic valve. There is moderate thickening of the aortic valve. Aortic  valve regurgitation is not visualized. Aortic valve  sclerosis/calcification is present, without any evidence  of aortic stenosis.   5. Aortic dilatation noted. There is mild dilatation of the aortic root,  measuring 42 mm.   6. The inferior vena cava is normal in size with greater than 50%  respiratory variability, suggesting right atrial pressure of 3 mmHg.    Laboratory Data:  High Sensitivity Troponin:   Recent Labs  Lab 05/15/23 1547 05/15/23 1735  TROPONINIHS 424* 880*      Chemistry Recent Labs  Lab 05/15/23 1547  NA 139  K 4.1  CL 104  CO2 25  GLUCOSE 206*  BUN 16  CREATININE 1.13  CALCIUM 9.7  GFRNONAA >60  ANIONGAP 10  No results for input(s): "PROT", "ALBUMIN", "AST", "ALT",  "ALKPHOS", "BILITOT" in the last 168 hours. Lipids No results for input(s): "CHOL", "TRIG", "HDL", "LABVLDL", "LDLCALC", "CHOLHDL" in the last 168 hours. Hematology Recent Labs  Lab 05/15/23 1547  WBC 6.2  RBC 4.35  HGB 13.2  HCT 38.5*  MCV 88.5  MCH 30.3  MCHC 34.3  RDW 12.7  PLT 163   Thyroid No results for input(s): "TSH", "FREET4" in the last 168 hours. BNPNo results for input(s): "BNP", "PROBNP" in the last 168 hours.  DDimer No results for input(s): "DDIMER" in the last 168 hours.   Radiology/Studies:  [facility] Chest Port 1 View  Result Date: 05/15/2023 CLINICAL DATA:  Chest pain. EXAM: PORTABLE CHEST 1 VIEW COMPARISON:  04/25/2023 radiograph and CT FINDINGS: Lower lung volumes from prior exam. The known right upper lobe pulmonary nodule is faintly visualized. Heterogeneous opacity in the lung bases. The heart is normal in size. Stable mediastinal contours. Aortic atherosclerosis. No pneumothorax or large pleural effusion. IMPRESSION: Lower lung volumes from prior exam with heterogeneous opacity in the lung bases, may be atelectasis, pneumonia, or aspiration. Known right upper lobe nodule is faintly visualized. Electronically Signed   By: Narda Rutherford M.D.   On: 05/15/2023 16:09     Assessment and Plan:   Chest pain: Patient with acute chest pain in the setting of a rising HS-troponin, concerning for NSTEMI-ACS.  He is currently hemodynamically stable and without active chest pain.  ECG unchanged from prior with repolarization changes noted in January still persistent.  He has received high-dose aspirin and now on a heparin drip.   --Given need for possible coronary bypass surgery, I will stop clopidogrel for now. --Continue aspirin and heparin drip. --Repeat echocardiogram in the morning.  Coronary artery disease Known single vessel coronary artery disease with aneurysmal LAD coronary artery.  Here with NSTEMI-ACS.  On guideline directed medical therapy prior that included  dual antiplatelet therapy and atorvastatin.  Patient was also taking Imdur for chest pain control.  Known wall motion abnormalities on prior echocardiogram.   --Repeat lipid panel. --Acute chest pain management as above.  Hypertension: Primary hypertension without overt end-organ damage.  Blood pressure mostly at goal at this time.  Only taking amlodipine at home. --Restart home amlodipine.  Carotid artery disease: History or carotid artery stenosis status post right carotid endarterectomy.  On appropriate secondary prevention medications prior to admission.  Risk Assessment/Risk Scores:    TIMI Risk Score for Unstable Angina or Non-ST Elevation MI:   The patient's TIMI risk score is 5, which indicates a 26% risk of all cause mortality, new or recurrent myocardial infarction or need for urgent revascularization in the next 14 days.       Severity of Illness: The appropriate patient status for this patient is INPATIENT. Inpatient status is judged to be reasonable and necessary in order to provide the required intensity of service to ensure the patient's safety. The patient's presenting symptoms, physical exam findings, and initial radiographic and laboratory data in the context of their chronic comorbidities is felt to place them at high risk for further clinical deterioration. Furthermore, it is not anticipated that the patient will be medically stable for discharge from the hospital within 2 midnights of admission.   * I certify that at the point of admission it is my clinical judgment that the patient will require inpatient hospital care spanning beyond 2 midnights from the point of admission due to high intensity of service, high risk for  further deterioration and high frequency of surveillance required.*   For questions or updates, please contact [facility] Please consult [email or web address] for contact info under     Signed, Judie Grieve, MD  05/15/2023 10:11 PM

## 2023-05-15 NOTE — ED Notes (Signed)
Patient states nitro brought his chest pain down to a 4 from a 8

## 2023-05-15 NOTE — ED Notes (Signed)
CRITICAL VALUE STICKER  CRITICAL VALUE: Trop  RECEIVER (on-site recipient of call):Daryl Quiros 05/15/2023 1625 DATE & TIME NOTIFIED:   MESSENGER (representative from lab):  MD NOTIFIED: samuel gray  TIME OF NOTIFICATION:1630  RESPONSE:

## 2023-05-16 ENCOUNTER — Other Ambulatory Visit (HOSPITAL_COMMUNITY): Payer: Self-pay

## 2023-05-16 ENCOUNTER — Inpatient Hospital Stay (HOSPITAL_COMMUNITY)
Admission: RE | Admit: 2023-05-16 | Discharge: 2023-05-16 | Disposition: A | Discharge status: Home / Self Care | Payer: PPO | Source: Ambulatory Visit

## 2023-05-16 ENCOUNTER — Other Ambulatory Visit: Payer: Self-pay | Admitting: *Deleted

## 2023-05-16 ENCOUNTER — Inpatient Hospital Stay (HOSPITAL_COMMUNITY): Payer: PPO

## 2023-05-16 ENCOUNTER — Other Ambulatory Visit: Payer: Self-pay

## 2023-05-16 ENCOUNTER — Encounter (HOSPITAL_COMMUNITY)
Admission: EM | Disposition: A | Discharge status: Home / Self Care | Payer: Self-pay | Source: Home / Self Care | Attending: Internal Medicine

## 2023-05-16 DIAGNOSIS — R911 Solitary pulmonary nodule: Secondary | ICD-10-CM | POA: Diagnosis not present

## 2023-05-16 DIAGNOSIS — I214 Non-ST elevation (NSTEMI) myocardial infarction: Secondary | ICD-10-CM

## 2023-05-16 DIAGNOSIS — I2510 Atherosclerotic heart disease of native coronary artery without angina pectoris: Secondary | ICD-10-CM | POA: Diagnosis not present

## 2023-05-16 DIAGNOSIS — I25110 Atherosclerotic heart disease of native coronary artery with unstable angina pectoris: Secondary | ICD-10-CM

## 2023-05-16 DIAGNOSIS — I10 Essential (primary) hypertension: Secondary | ICD-10-CM | POA: Diagnosis not present

## 2023-05-16 HISTORY — PX: LEFT HEART CATH AND CORONARY ANGIOGRAPHY: CATH118249

## 2023-05-16 LAB — CBC
HCT: 37.9 % — ABNORMAL LOW (ref 39.0–52.0)
Hemoglobin: 12.6 g/dL — ABNORMAL LOW (ref 13.0–17.0)
MCH: 29.7 pg (ref 26.0–34.0)
MCHC: 33.2 g/dL (ref 30.0–36.0)
MCV: 89.4 fL (ref 80.0–100.0)
Platelets: 148 K/uL — ABNORMAL LOW (ref 150–400)
RBC: 4.24 MIL/uL (ref 4.22–5.81)
RDW: 12.7 % (ref 11.5–15.5)
WBC: 7.1 K/uL (ref 4.0–10.5)
nRBC: 0 % (ref 0.0–0.2)

## 2023-05-16 LAB — LIPID PANEL
Cholesterol: 104 mg/dL (ref 0–200)
HDL: 33 mg/dL — ABNORMAL LOW (ref >40)
LDL Cholesterol: 44 mg/dL (ref 0–99)
Total CHOL/HDL Ratio: 3.2 ratio
Triglycerides: 133 mg/dL (ref <150)
VLDL: 27 mg/dL (ref 0–40)

## 2023-05-16 LAB — BASIC METABOLIC PANEL
Anion gap: 8 (ref 5–15)
BUN: 13 mg/dL (ref 8–23)
CO2: 24 mmol/L (ref 22–32)
Calcium: 8.7 mg/dL — ABNORMAL LOW (ref 8.9–10.3)
Chloride: 105 mmol/L (ref 98–111)
Creatinine, Ser: 1.13 mg/dL (ref 0.61–1.24)
GFR, Estimated: >60 mL/min (ref >60)
Glucose, Bld: 125 mg/dL — ABNORMAL HIGH (ref 70–99)
Potassium: 3.9 mmol/L (ref 3.5–5.1)
Sodium: 137 mmol/L (ref 135–145)

## 2023-05-16 LAB — ECHOCARDIOGRAM COMPLETE
AR max vel: 2.77 cm2
AV Area VTI: 2.92 cm2
AV Area mean vel: 3.03 cm2
AV Mean grad: 7 mmHg
AV Peak grad: 12.7 mmHg
Ao pk vel: 1.78 m/s
Area-P 1/2: 2.11 cm2
Height: 72 in
S' Lateral: 3.5 cm
Weight: 2995.2 oz

## 2023-05-16 LAB — HEPARIN LEVEL (UNFRACTIONATED)
Heparin Unfractionated: 0.4 [IU]/mL (ref 0.30–0.70)
Heparin Unfractionated: 0.45 [IU]/mL (ref 0.30–0.70)

## 2023-05-16 SURGERY — LEFT HEART CATH AND CORONARY ANGIOGRAPHY
Anesthesia: LOCAL

## 2023-05-16 MED ORDER — LIDOCAINE HCL (PF) 1 % IJ SOLN
INTRAMUSCULAR | Status: DC | PRN
  Administered 2023-05-16: 2 mL

## 2023-05-16 MED ORDER — HEPARIN (PORCINE) 25000 UT/250ML-% IV SOLN
1150.0000 [IU]/h | INTRAVENOUS | Status: AC
  Administered 2023-05-16: 1150 [IU]/h via INTRAVENOUS
  Filled 2023-05-16: qty 250

## 2023-05-16 MED ORDER — FENTANYL CITRATE (PF) 100 MCG/2ML IJ SOLN
INTRAMUSCULAR | Status: AC
  Filled 2023-05-16: qty 2

## 2023-05-16 MED ORDER — TICAGRELOR 90 MG PO TABS: 90.0000 mg | ORAL_TABLET | Freq: Two times a day (BID) | ORAL | Status: DC

## 2023-05-16 MED ORDER — IOHEXOL 350 MG/ML SOLN
INTRAVENOUS | Status: DC | PRN
  Administered 2023-05-16: 65 mL

## 2023-05-16 MED ORDER — HEPARIN SODIUM (PORCINE) 1000 UNIT/ML IJ SOLN
INTRAMUSCULAR | Status: DC | PRN
  Administered 2023-05-16: 4000 [IU] via INTRAVENOUS

## 2023-05-16 MED ORDER — TICAGRELOR 90 MG PO TABS
180.0000 mg | ORAL_TABLET | Freq: Once | ORAL | Status: AC
  Administered 2023-05-16: 180 mg via ORAL
  Filled 2023-05-16: qty 2

## 2023-05-16 MED ORDER — VERAPAMIL HCL 2.5 MG/ML IV SOLN
INTRAVENOUS | Status: DC | PRN
  Administered 2023-05-16: 10 mL via INTRA_ARTERIAL

## 2023-05-16 MED ORDER — MIDAZOLAM HCL 2 MG/2ML IJ SOLN
INTRAMUSCULAR | Status: AC
  Filled 2023-05-16: qty 2

## 2023-05-16 MED ORDER — TICAGRELOR 90 MG PO TABS
90.0000 mg | ORAL_TABLET | Freq: Two times a day (BID) | ORAL | Status: DC
  Administered 2023-05-17: 90 mg via ORAL
  Filled 2023-05-16: qty 1

## 2023-05-16 MED ORDER — SODIUM CHLORIDE 0.9% FLUSH: 3.0000 mL | INTRAVENOUS | Status: DC | PRN

## 2023-05-16 MED ORDER — SODIUM CHLORIDE 0.9% FLUSH
3.0000 mL | Freq: Two times a day (BID) | INTRAVENOUS | Status: DC
  Administered 2023-05-17: 3 mL via INTRAVENOUS

## 2023-05-16 MED ORDER — FENTANYL CITRATE (PF) 100 MCG/2ML IJ SOLN
INTRAMUSCULAR | Status: DC | PRN
  Administered 2023-05-16: 25 ug via INTRAVENOUS

## 2023-05-16 MED ORDER — SODIUM CHLORIDE 0.9 % IV SOLN: 250.0000 mL | INTRAVENOUS | Status: DC | PRN

## 2023-05-16 MED ORDER — HEPARIN SODIUM (PORCINE) 1000 UNIT/ML IJ SOLN
INTRAMUSCULAR | Status: AC
  Filled 2023-05-16: qty 10

## 2023-05-16 MED ORDER — MIDAZOLAM HCL 2 MG/2ML IJ SOLN
INTRAMUSCULAR | Status: DC | PRN
  Administered 2023-05-16: 1 mg via INTRAVENOUS

## 2023-05-16 MED ORDER — LABETALOL HCL 5 MG/ML IV SOLN: 10.0000 mg | INTRAVENOUS | Status: AC | PRN

## 2023-05-16 MED ORDER — ONDANSETRON HCL 4 MG/2ML IJ SOLN: 4.0000 mg | Freq: Four times a day (QID) | INTRAMUSCULAR | Status: DC | PRN

## 2023-05-16 MED ORDER — ASPIRIN 81 MG PO CHEW: 81.0000 mg | CHEWABLE_TABLET | ORAL | Status: DC

## 2023-05-16 MED ORDER — SODIUM CHLORIDE 0.9% FLUSH
3.0000 mL | Freq: Two times a day (BID) | INTRAVENOUS | Status: DC
  Administered 2023-05-16 – 2023-05-17 (×4): 3 mL via INTRAVENOUS

## 2023-05-16 MED ORDER — HYDRALAZINE HCL 20 MG/ML IJ SOLN: 10.0000 mg | INTRAMUSCULAR | Status: AC | PRN

## 2023-05-16 MED ORDER — SODIUM CHLORIDE 0.9 % WEIGHT BASED INFUSION
1.0000 mL/kg/h | INTRAVENOUS | Status: DC
  Administered 2023-05-16: 1 mL/kg/h via INTRAVENOUS

## 2023-05-16 MED ORDER — SODIUM CHLORIDE 0.9 % IV SOLN: INTRAVENOUS | Status: AC

## 2023-05-16 MED ORDER — SODIUM CHLORIDE 0.9 % WEIGHT BASED INFUSION
3.0000 mL/kg/h | INTRAVENOUS | Status: AC
  Administered 2023-05-16: 3 mL/kg/h via INTRAVENOUS

## 2023-05-16 MED ORDER — VERAPAMIL HCL 2.5 MG/ML IV SOLN
INTRAVENOUS | Status: AC
  Filled 2023-05-16: qty 2

## 2023-05-16 MED ORDER — LIDOCAINE HCL (PF) 1 % IJ SOLN
INTRAMUSCULAR | Status: AC
  Filled 2023-05-16: qty 30

## 2023-05-16 MED ORDER — ACETAMINOPHEN 325 MG PO TABS
650.0000 mg | ORAL_TABLET | ORAL | Status: DC | PRN
  Administered 2023-05-16: 650 mg via ORAL
  Filled 2023-05-16: qty 2

## 2023-05-16 MED ORDER — HEPARIN (PORCINE) IN NACL 1000-0.9 UT/500ML-% IV SOLN
INTRAVENOUS | Status: DC | PRN
  Administered 2023-05-16 (×2): 500 mL

## 2023-05-16 SURGICAL SUPPLY — 11 items
BAND CMPR LRG ZPHR (HEMOSTASIS) ×1
BAND ZEPHYR COMPRESS 30 LONG (HEMOSTASIS) IMPLANT
CATH 5FR JL3.5 JR4 ANG PIG MP (CATHETERS) IMPLANT
CATH INFINITI 5FR JL4 (CATHETERS) IMPLANT
GLIDESHEATH SLEND SS 6F .021 (SHEATH) IMPLANT
GUIDEWIRE INQWIRE 1.5J.035X260 (WIRE) IMPLANT
INQWIRE 1.5J .035X260CM (WIRE) ×1
KIT HEART LEFT (KITS) ×1 IMPLANT
PACK CARDIAC CATHETERIZATION (CUSTOM PROCEDURE TRAY) ×1 IMPLANT
TRANSDUCER W/STOPCOCK (MISCELLANEOUS) ×1 IMPLANT
TUBING CIL FLEX 10 FLL-RA (TUBING) ×1 IMPLANT

## 2023-05-16 NOTE — Progress Notes (Addendum)
Rounding Note    Patient Name: Gregory [name] Date of Encounter: 05/16/2023  [facility] Cardiologist: Rollene Rotunda, MD   Subjective   Sitting up in bed. Had an episode of chest pain at 6am.   Inpatient Medications    Scheduled Meds:  ALPRAZolam  0.5 mg Oral QHS   amLODipine  5 mg Oral Daily   aspirin EC  81 mg Oral Daily   atorvastatin  80 mg Oral Daily   isosorbide mononitrate  15 mg Oral Daily   mirtazapine  7.5 mg Oral QHS   sertraline  150 mg Oral Daily   tamsulosin  0.4 mg Oral QHS   Continuous Infusions:  heparin 1,150 Units/hr (05/15/23 1901)   PRN Meds: acetaminophen, nitroGLYCERIN, ondansetron (ZOFRAN) IV   Vital Signs    Vitals:   05/16/23 0357 05/16/23 0558 05/16/23 0609 05/16/23 0628  BP: 121/64 134/77 102/64 122/71  Pulse: (!) 52     Resp: 16     Temp: 97.8 F (36.6 C)     TempSrc: Oral     SpO2: 95%     Weight:      Height:        Intake/Output Summary (Last 24 hours) at 05/16/2023 0749 Last data filed at 05/16/2023 0700 Gross per 24 hour  Intake 534.54 ml  Output 475 ml  Net 59.54 ml      05/15/2023    8:38 PM 05/15/2023    6:38 PM 05/03/2023    3:41 PM  Last 3 Weights  Weight (lbs) 187 lb 3.2 oz 184 lb 15.5 oz 185 lb  Weight (kg) 84.913 kg 83.9 kg 83.915 kg      Telemetry    Sinus brady HR 50s. ST depression - Personally Reviewed  ECG    No new  Physical Exam   GEN: No acute distress.   Neck: No JVD Cardiac: RRR, no, rubs, or gallops. Loud systolic murmur Respiratory: Fine crackles in bases bilaterally, clear otherwise MS: No edema; No deformity.  Psych: Normal affect   Labs    High Sensitivity Troponin:   Recent Labs  Lab 05/15/23 1547 05/15/23 1735  TROPONINIHS 424* 880*     Chemistry Recent Labs  Lab 05/15/23 1547 05/16/23 0327  NA 139 137  K 4.1 3.9  CL 104 105  CO2 25 24  GLUCOSE 206* 125*  BUN 16 13  CREATININE 1.13 1.13  CALCIUM 9.7 8.7*  GFRNONAA >60 >60  ANIONGAP 10 8     Lipids  Recent Labs  Lab 05/16/23 0327  CHOL 104  TRIG 133  HDL 33*  LDLCALC 44  CHOLHDL 3.2    Hematology Recent Labs  Lab 05/15/23 1547 05/16/23 0327  WBC 6.2 7.1  RBC 4.35 4.24  HGB 13.2 12.6*  HCT 38.5* 37.9*  MCV 88.5 89.4  MCH 30.3 29.7  MCHC 34.3 33.2  RDW 12.7 12.7  PLT 163 148*    Radiology    DG Chest Port 1 View  Result Date: 05/15/2023 CLINICAL DATA:  Chest pain. EXAM: PORTABLE CHEST 1 VIEW COMPARISON:  04/25/2023 radiograph and CT FINDINGS: Lower lung volumes from prior exam. The known right upper lobe pulmonary nodule is faintly visualized. Heterogeneous opacity in the lung bases. The heart is normal in size. Stable mediastinal contours. Aortic atherosclerosis. No pneumothorax or large pleural effusion. IMPRESSION: Lower lung volumes from prior exam with heterogeneous opacity in the lung bases, may be atelectasis, pneumonia, or aspiration. Known right upper lobe nodule is faintly  visualized. Electronically Signed   By: Narda Rutherford M.D.   On: 05/15/2023 16:09    Cardiac Studies   Coronary angiography 12/29/22:   Prox LAD to Mid LAD lesion is 50% stenosed.   Dist Cx lesion is 50% stenosed.   Prox LAD lesion is 80% stenosed.   # Echocardiogram 12/24/22: EF 50 to 55%.  Low normal function.  Mild anterior lateral and anterior wall hypokinesis.  Normal RV size and function.  Grossly normal mitral valve.  Aortic valve sclerosis with no stenosis.  Aortic root measured 4.2 cm.  Normal RAP.   Echo performed today (05/16/2023): EF 55 to 60%.  No RWMA.  Mild LVH.  GR 1 DD (normal for age).  Normal RV.  Grossly normal mitral valve.  Calcified aortic valve with sclerosis but no stenosis.  Mild aortic root dilation.  Normal RAP.  No change from prior study.  Cardiac Cath Today 05/16/2023: Heavily calcified proximal to mid LAD. Plaque extends from the distal left main into the ostial and proximal LAD. Focal, 80% mid LAD lesion just prior to an ectatic segment in the mid  LAD. Treating this would require calcium modification.  Additionally, compared to January angiography, slow flow in the LCx with right to left collaterals to the distal LCx is new.  Suspect hypodense lesion in the mid LCx-thrombus versus calcium.  Not approachable for percutaneous standpoint-severe tortuosity in the proximal LCx making it very difficult.  Favor medical therapy.  Distal LM 40% concentric, Ost-Prox LAD 50%, Prox LAD calcified 80%, Prox-Mid LAD 50%;     Patient Profile     89 y.o. male with a significant past medical history of coronary artery disease with noted coronary aneurysm of the LAD (recent NSTEMI-ACS x2 in January of 2024 without revascularization), hypertension, prior cerebrovascular accident, lung nodule of unclear significance, abdominal aorta aneurysm, and carotid artery disease status post right carotid endarterectomy who was seen 05/15/2023 for the evaluation of chest pain.   Assessment & Plan    CAD / NSTEMI -- chest pain at 6am, reports it lasted 45 min and resolved with nitro -- Cath 12/29/22 showed LAD 80% stenosis with adjacent aneurysmal segment. No intervention was done, medical therapy.  --Continue aspirin 81mg  daily, lipitor 80mg  daily, imdur 15mg  daily  --On IV heparin, plavix stopped due to possible need for CABG -- NPO- did not eat breakfast this AM -- Repeat LHC today => reveals significant progression of disease in the LAD with new de novo lesion in the LCx.  Plan would be to convert to Brilinta, but treat with IV heparin due to positive troponins.  I have discussed with Dr. Chandra Batch cardiothoracic surgeon-who will talk with the patient.  Question his candidacy for CABG as PCI is not a very favorable option.  LAD PCI would only treat half of the disease and will be very complicated with extensive atherectomy required, and with ostial LAD and distal left main disease there is not a good landing zone necessarily to land a stent.  The poststenotic  aneurysmal segment makes it also very difficult to land a stent appropriately.  The LCx lesion is likely not approachable from a percutaneous standpoint.  Thankfully, his EF is still preserved with no wall motion normalities.  Difficult decisions need to be made about how to handle his CAD.  He has had multiple recurrent admissions for angina and therefore will need to titrate his antianginal medications.  HTN: --BP controlled on amlodipine  Carotid artery disease: -- continue aspirin and  lipitor as above.   Lung Nodule -- scheduled for bronch, was canceled due to chest pain/CAD -- Cath today to further define LAD lesions  -- Need to optimize CAD before surgery -> in discussion with Dr. Orson Aloe, there is a likelihood that he would not be a candidate for thoracic surgery and the plan would be potentially debulking XRT.  This may change with consideration of possible CABG requirement.   For questions or updates, please contact [facility] Please consult [email or web address] for contact info under        Signed, Osborne Oman, RN Student Nurse Practitioner 05/16/2023, 7:49 AM       ATTENDING ATTESTATION  I have seen, examined and evaluated the patient this PM along with Deniece Ree, RN - Student NP.   After reviewing all the available data and chart, we discussed the patients laboratory, study & physical findings as well as symptoms in detail.  I agree with her findings, examination as well as impression recommendations as per our discussion.    Attending adjustments noted in italics.    Difficult scenario now that he has had his heart catheterization showing progression of disease.  Unfavorable PCI options.  Will await conversation between patient and Dr. Dorris Fetch re: potential revascularization options.  For now we will start back on IV heparin and adjust antianginal medicines.    Marykay Lex, MD, MS Bryan Lemma, M.D., M.S. Interventional Cardiologist  [facility]  Pager # [phone] Phone # [phone] [address] [place], [place] [address]

## 2023-05-16 NOTE — Progress Notes (Signed)
ANTICOAGULATION CONSULT NOTE - Follow Up Consult  Pharmacy Consult for heparin Indication: chest pain/ACS  Labs: Recent Labs    05/15/23 1547 05/15/23 1735 05/16/23 0327  HGB 13.2  --  12.6*  HCT 38.5*  --  37.9*  PLT 163  --  148*  HEPARINUNFRC  --   --  0.40  CREATININE 1.13  --   --   TROPONINIHS 424* 880*  --     Assessment/Plan:  89yo male therapeutic on heparin with initial dosing for CP. Will continue infusion at current rate of 1150 units/hr and confirm stable with additional level.  Vernard Gambles, PharmD, BCPS 05/16/2023 4:05 AM

## 2023-05-16 NOTE — Interval H&P Note (Signed)
Cath Lab Visit (complete for each Cath Lab visit)  Clinical Evaluation Leading to the Procedure:   ACS: Yes.    Non-ACS:    Anginal Classification: CCS IV  Anti-ischemic medical therapy: Minimal Therapy (1 class of medications)  Non-Invasive Test Results: No non-invasive testing performed  Prior CABG: No previous CABG      History and Physical Interval Note:  05/16/2023 11:20 AM  Gregory [name]  has presented today for surgery, with the diagnosis of nstemi.  The various methods of treatment have been discussed with the patient and family. After consideration of risks, benefits and other options for treatment, the patient has consented to  Procedure(s): LEFT HEART CATH AND CORONARY ANGIOGRAPHY (N/A) as a surgical intervention.  The patient's history has been reviewed, patient examined, no change in status, stable for surgery.  I have reviewed the patient's chart and labs.  Questions were answered to the patient's satisfaction.     Lance Muss

## 2023-05-16 NOTE — Progress Notes (Signed)
Pt reported cp this am relieved with 1 SL ntg administered by Adolphus Birchwood, RN.

## 2023-05-16 NOTE — Progress Notes (Signed)
ANTICOAGULATION CONSULT NOTE  Pharmacy Consult for Heparin Indication: chest pain/ACS  No Known Allergies  Patient Measurements: Height: 6' (182.9 cm) Weight: 84.9 kg (187 lb 3.2 oz) IBW/kg (Calculated) : 77.6 Heparin Dosing Weight: 83.9 kg  Vital Signs: Temp: 98.2 F (36.8 C) (05/28 0900) Temp Source: Oral (05/28 0900) BP: 108/67 (05/28 1219) Pulse Rate: 53 (05/28 0900)  Labs: Recent Labs    05/15/23 1547 05/15/23 1735 05/16/23 0327 05/16/23 0909  HGB 13.2  --  12.6*  --   HCT 38.5*  --  37.9*  --   PLT 163  --  148*  --   HEPARINUNFRC  --   --  0.40 0.45  CREATININE 1.13  --  1.13  --   TROPONINIHS 424* 880*  --   --      Estimated Creatinine Clearance: 51.5 mL/min (by C-G formula based on SCr of 1.13 mg/dL).   Medical History: Past Medical History:  Diagnosis Date   Abdominal aneurysm (HCC)    Anginal pain (HCC)    Depression    History of kidney stones    Hyperlipidemia    Hypertension    Myocardial infarction [facility]) 04/19/2023   NSTEMI   Stroke [facility]) 2004?   TIA    Medications:  Medications Prior to Admission  Medication Sig Dispense Refill Last Dose   ALPRAZolam (XANAX) 0.5 MG tablet Take 0.5 mg by mouth at bedtime. Take 0.5 mg in the morning and 1 mg at bedtime   05/14/2023   amLODipine (NORVASC) 5 MG tablet Take 5 mg by mouth daily.   05/15/2023   aspirin 81 MG tablet Take 81 mg by mouth daily.   05/15/2023   atorvastatin (LIPITOR) 80 MG tablet Take 1 tablet (80 mg total) by mouth daily. 90 tablet 3 05/15/2023   clopidogrel (PLAVIX) 75 MG tablet Take 75 mg by mouth daily.   05/15/2023   isosorbide mononitrate (IMDUR) 30 MG 24 hr tablet Take 0.5 tablets (15 mg total) by mouth daily. 30 tablet 5 05/15/2023   mirtazapine (REMERON) 7.5 MG tablet TAKE 1 TABLET(7.5 MG) BY MOUTH AT BEDTIME (Patient taking differently: Take 7.5 mg by mouth at bedtime.) 90 tablet 1 05/14/2023   Multiple Vitamins-Minerals (CENTRUM ADULTS PO) Take 1 tablet by mouth daily.     05/15/2023   Multiple Vitamins-Minerals (ZINC PO) Take by mouth.   05/14/2023   nitroGLYCERIN (NITROSTAT) 0.4 MG SL tablet Place 1 tablet (0.4 mg total) under the tongue every 5 (five) minutes as needed for chest pain (up to 3 doses. If taking 3rd dose, call 911). 25 tablet 3 05/15/2023   Omega-3 Fatty Acids (FISH OIL PO) Take 1 capsule by mouth daily.   05/15/2023   sertraline (ZOLOFT) 50 MG tablet TAKE 3 TABLETS(150 MG) BY MOUTH DAILY (Patient taking differently: Take 150 mg by mouth daily.) 270 tablet 1 05/15/2023   tamsulosin (FLOMAX) 0.4 MG CAPS capsule Take 0.4 mg by mouth at bedtime.   05/14/2023   UNABLE TO FIND Take 1 tablet by mouth daily. Bone supplement   05/15/2023    Scheduled:   ALPRAZolam  0.5 mg Oral QHS   amLODipine  5 mg Oral Daily   aspirin EC  81 mg Oral Daily   atorvastatin  80 mg Oral Daily   isosorbide mononitrate  15 mg Oral Daily   mirtazapine  7.5 mg Oral QHS   sertraline  150 mg Oral Daily   sodium chloride flush  3 mL Intravenous Q12H   sodium chloride  flush  3 mL Intravenous Q12H   tamsulosin  0.4 mg Oral QHS   ticagrelor  180 mg Oral Once   ticagrelor  90 mg Oral BID   Infusions:   sodium chloride     sodium chloride     PRN: sodium chloride, acetaminophen, hydrALAZINE, labetalol, nitroGLYCERIN, ondansetron (ZOFRAN) IV, sodium chloride flush  Assessment: 88 yom with a history of HLD, unstable angina, HTN, TIA, CKD, CAD, CVA, AAA. Patient is presenting with chest pain. Heparin per pharmacy consult placed for chest pain/ACS.  Patient is not on anticoagulation prior to arrival.  Heparin level was therapeutic at 0.45 earlier, on 1150 units/hr. Hgb 12.6, plt 148. No s/sx of bleeding or infusion issues. Underwent cardiac cath showing slow flow in the Lcx with collaterals - plan for medical management (restart heparin 2 hours after TR band removed).  Goal of Therapy:  Heparin level 0.3-0.7 units/ml Monitor platelets by anticoagulation protocol: Yes   Plan:   Restart heparin infusion at 1150 units/hr 2 hours after TR band removed Check anti-Xa level daily while on heparin Continue to monitor H&H and platelets Consider stop date of 48 hours total for heparin infusion   Thank you for allowing pharmacy to participate in this patient's care,  Sherron Monday, PharmD, BCCCP Clinical Pharmacist  Phone: [phone] 05/16/2023 1:43 PM  Please check AMION for all [facility] phone numbers After 10:00 PM, call Main Pharmacy [phone]

## 2023-05-16 NOTE — TOC Benefit Eligibility Note (Signed)
Patient Advocate Encounter  Insurance verification completed.    The patient is currently admitted and upon discharge could be taking Brilinta 90 mg.  The current 30 day co-pay is $47.00.   The patient is insured through [employer] Medicare Part D   This test claim was processed through [facility]- copay amounts may vary at other pharmacies due to pharmacy/plan contracts, or as the patient moves through the different stages of their insurance plan.  Gregory [name], CPHT Pharmacy Patient Advocate Specialist [facility] Patient Advocate Team Direct Number: [phone]  Fax: [phone]       

## 2023-05-16 NOTE — Progress Notes (Signed)
Echocardiogram 2D Echocardiogram has been performed.  Warren Lacy Remiel Corti RDCS 05/16/2023, 2:27 PM

## 2023-05-17 ENCOUNTER — Other Ambulatory Visit (HOSPITAL_COMMUNITY): Payer: Self-pay

## 2023-05-17 ENCOUNTER — Encounter (HOSPITAL_COMMUNITY): Payer: Self-pay | Admitting: Interventional Cardiology

## 2023-05-17 ENCOUNTER — Other Ambulatory Visit: Payer: Self-pay | Admitting: *Deleted

## 2023-05-17 ENCOUNTER — Inpatient Hospital Stay (HOSPITAL_COMMUNITY)
Admission: RE | Admit: 2023-05-17 | Payer: PPO | Source: Home / Self Care | Admitting: Thoracic Surgery (Cardiothoracic Vascular Surgery)

## 2023-05-17 ENCOUNTER — Encounter (HOSPITAL_COMMUNITY): Admission: RE | Payer: Self-pay | Source: Home / Self Care

## 2023-05-17 DIAGNOSIS — E785 Hyperlipidemia, unspecified: Secondary | ICD-10-CM | POA: Diagnosis not present

## 2023-05-17 DIAGNOSIS — R911 Solitary pulmonary nodule: Secondary | ICD-10-CM

## 2023-05-17 DIAGNOSIS — N183 Chronic kidney disease, stage 3 unspecified: Secondary | ICD-10-CM

## 2023-05-17 DIAGNOSIS — I214 Non-ST elevation (NSTEMI) myocardial infarction: Secondary | ICD-10-CM | POA: Diagnosis not present

## 2023-05-17 DIAGNOSIS — I10 Essential (primary) hypertension: Secondary | ICD-10-CM | POA: Diagnosis not present

## 2023-05-17 DIAGNOSIS — I25110 Atherosclerotic heart disease of native coronary artery with unstable angina pectoris: Secondary | ICD-10-CM

## 2023-05-17 LAB — BASIC METABOLIC PANEL
Anion gap: 9 (ref 5–15)
BUN: 14 mg/dL (ref 8–23)
CO2: 24 mmol/L (ref 22–32)
Calcium: 8.9 mg/dL (ref 8.9–10.3)
Chloride: 104 mmol/L (ref 98–111)
Creatinine, Ser: 1.3 mg/dL — ABNORMAL HIGH (ref 0.61–1.24)
GFR, Estimated: 54 mL/min — ABNORMAL LOW (ref >60)
Glucose, Bld: 115 mg/dL — ABNORMAL HIGH (ref 70–99)
Potassium: 3.8 mmol/L (ref 3.5–5.1)
Sodium: 137 mmol/L (ref 135–145)

## 2023-05-17 LAB — CBC
HCT: 37.5 % — ABNORMAL LOW (ref 39.0–52.0)
Hemoglobin: 12.5 g/dL — ABNORMAL LOW (ref 13.0–17.0)
MCH: 29.7 pg (ref 26.0–34.0)
MCHC: 33.3 g/dL (ref 30.0–36.0)
MCV: 89.1 fL (ref 80.0–100.0)
Platelets: 157 10*3/uL (ref 150–400)
RBC: 4.21 MIL/uL — ABNORMAL LOW (ref 4.22–5.81)
RDW: 12.8 % (ref 11.5–15.5)
WBC: 7.7 10*3/uL (ref 4.0–10.5)
nRBC: 0 % (ref 0.0–0.2)

## 2023-05-17 LAB — HEPARIN LEVEL (UNFRACTIONATED): Heparin Unfractionated: 0.3 IU/mL (ref 0.30–0.70)

## 2023-05-17 SURGERY — WEDGE RESECTION, LUNG, ROBOT-ASSISTED, THORACOSCOPIC
Anesthesia: General | Site: Chest | Laterality: Right

## 2023-05-17 MED ORDER — ISOSORBIDE MONONITRATE ER 30 MG PO TB24
30.0000 mg | ORAL_TABLET | Freq: Every day | ORAL | Status: DC
  Administered 2023-05-17 – 2023-05-18 (×2): 30 mg via ORAL
  Filled 2023-05-17 (×2): qty 1

## 2023-05-17 MED ORDER — RANOLAZINE ER 500 MG PO TB12
500.0000 mg | ORAL_TABLET | Freq: Two times a day (BID) | ORAL | Status: DC
  Administered 2023-05-17 – 2023-05-18 (×3): 500 mg via ORAL
  Filled 2023-05-17 (×3): qty 1

## 2023-05-17 NOTE — H&P (View-Only) (Signed)
    [address]       [place] [address]             [phone]       Was asked to see Mr. Behne regarding his two-vessel coronary disease.  I know him from my working up his right upper lobe lung nodule.  Was originally scheduled for surgery for the lung nodule, but canceled due to anginal pain.  Then admitted with unstable angina over the weekend.  Catheterization showed two-vessel coronary disease with a complex lesion in the LAD with post stent aneurysmal dilatation and a tortuous circumflex with an 80% stenosis past the takeoff of a large marginal branch.   Subjective: No complaints at present  Objective: Vital signs in last 24 hours: Temp:  [97.6 F (36.4 C)-98.6 F (37 C)] 98.5 F (36.9 C) (05/29 1640) Pulse Rate:  [60-66] 62 (05/29 0838) Cardiac Rhythm: Sinus bradycardia (05/29 0700) Resp:  [14-18] 14 (05/29 1640) BP: (100-148)/(68-80) 100/68 (05/29 1640) SpO2:  [93 %-96 %] 93 % (05/29 0838)  Hemodynamic parameters for last 24 hours:    Intake/Output from previous day: 05/28 0701 - 05/29 0700 In: 400.3 [I.V.:400.3] Out: -  Intake/Output this shift: Total I/O In: 127.4 [I.V.:127.4] Out: -   General appearance: alert, cooperative, and no distress Neurologic: intact Heart: regular rate and rhythm Lungs: clear to auscultation bilaterally  Lab Results: Recent Labs    05/16/23 0327 05/17/23 0205  WBC 7.1 7.7  HGB 12.6* 12.5*  HCT 37.9* 37.5*  PLT 148* 157   BMET:  Recent Labs    05/16/23 0327 05/17/23 0205  NA 137 137  K 3.9 3.8  CL 105 104  CO2 24 24  GLUCOSE 125* 115*  BUN 13 14  CREATININE 1.13 1.30*  CALCIUM 8.7* 8.9    PT/INR: No results for input(s): "LABPROT", "INR" in the last 72 hours. ABG No results found for: "PHART", "HCO3", "TCO2", "ACIDBASEDEF", "O2SAT" CBG (last 3)  No results for input(s): "GLUCAP" in the last 72 hours.  Cardiac catheterization   Prox LAD to Mid LAD lesion is 50% stenosed.   Dist Cx  lesion is 50% stenosed.   Ost LAD to Prox LAD lesion is 50% stenosed.   Dist LM lesion is 40% stenosed.   Prox LAD lesion is 80% stenosed.   Mid Cx lesion is 80% stenosed.  Right to left collaterals noted to the distal circumflex system   The left ventricular systolic function is normal.   LV end diastolic pressure is normal.   The left ventricular ejection fraction is 55-65% by visual estimate.   There is no aortic valve stenosis.   JL4 needed for left main engagement.   Heavily calcified proximal to mid LAD.  Plaque extends from the distal left main into the ostial and proximal LAD.  Focal, 80% mid LAD lesion just prior to an ectatic segment in the mid LAD.  Treating this would require calcium modification.   Compared to January, the slow flow in the circumflex with right to left collaterals to the distal circumflex is a new finding.  Difficult to tell whether the hypodense lesion in the mid circumflex is thrombus or calcium.  Discussed with Dr. Harding.  Will restart IV heparin.  Stop Plavix.  Start Brilinta.  There is severe tortuosity in the proximal circumflex which would make any type of intervention in the mid circumflex difficult.  Would favor medical therapy at this point.  I personally reviewed the   cardiac catheterization images.  There is a complicated proximal to mid LAD stenosis with a poststenotic dilatation aneurysm and 80% stenosis in the distal circumflex beyond the takeoff of a large obtuse marginal.  There are some right to left collaterals.  Has good targets for bypass grafting.  Preserved left ventricular systolic function.    Assessment/Plan: S/P Procedure(s) (LRB): LEFT HEART CATH AND CORONARY ANGIOGRAPHY (N/A) Cord Plaza is an 89-year-old man with a history of remote tobacco use, hypertension, hyperlipidemia, right MCA stroke, non-ST elevation MI, CAD, stage IIIa chronic kidney disease, carotid disease, carotid stenting, and a right upper lobe lung  nodule.  Complicated situation as he was being evaluated for possible lung resection and then presented with unstable anginal symptoms.  At catheterization has two-vessel disease not amenable to percutaneous intervention.  Initial consideration was given to medical therapy.  I discussed the situation with Mr. Watrous and his family.  He is a candidate for coronary bypass grafting albeit at high risk due to his advanced age and comorbidities.  I would not consider combined lung resection given his age but may be able to biopsy of the right upper lobe nodule to get definitive diagnosis.  Coronary bypass grafting is indicated for survival benefit and relief of symptoms.  I discussed the proposed operative procedure with Mr. Hoston and his family.  They understand the need for general anesthesia, incisions to be used, the possible need for cardiopulmonary bypass, use of drainage tubes and temporary pacemaker wires postoperatively, the expected hospital stay, and the overall recovery.  I informed them of the indications, risk, benefits, and alternatives.  They understand high risk due to advanced age, previous stroke, chronic kidney disease, and possible lung cancer.  They understand the risks include, but not limited to death, MI, stroke, DVT, PE, bleeding, possible need for transfusion, infection, cardiac arrhythmias, respiratory or renal failure, as well as possibility of other unforeseeable complications.  If he decides to proceed with coronary bypass surgery we could do it on Monday, 05/22/2023.  Would need to stop Brilinta.  LOS: 2 days    Leani Myron C Talin Rozeboom 05/17/2023   

## 2023-05-17 NOTE — Plan of Care (Signed)

## 2023-05-17 NOTE — Progress Notes (Addendum)
CARDIAC REHAB PHASE I   PRE:  Rate/Rhythm: 69 SR    BP: sitting 134/66    SpO2: 96 RA  MODE:  Ambulation: 470 ft   POST:  Rate/Rhythm: 86 SR    BP: sitting 120/72     SpO2: 96 RA  Pt ambulated at quick pace. Steady. No c/o, feels well.   Discussed MI, restrictions, Brilinta importance, exercise, NTG and CRPII. Pt receptive. Will place referral for [place] CRPII however pt has declined in the past due to distance and preference [place] exercise [place].  [id number] 1210  Ethelda Chick [place], ACSM-CEP 05/17/2023 11:53 AM

## 2023-05-17 NOTE — Discharge Summary (Incomplete)
Discharge Summary    Patient ID: Gregory [name] MRN: [id number]; DOB: September 17, 1934  Admit date: 05/15/2023 Discharge date: 05/18/2023  PCP:  Barbie Banner, MD   [facility] Providers Cardiologist:  Rollene Rotunda, MD   {  Discharge Diagnoses    Principal Problem:   Non-ST elevation (NSTEMI) myocardial infarction [facility]) Active Problems:   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   AKI (acute kidney injury) (HCC)   Primary hypertension   Stage 3a chronic kidney disease (CKD) (HCC)   Lung nodule   CAD (coronary artery disease)   AAA (abdominal aortic aneurysm) without rupture (HCC)   Carotid artery disease (HCC)   Dyslipidemia   Diagnostic Studies/Procedures    Vascular doppler 05/18/2023: Essentially normal.  139% bilateral ICA.  Normal vertebrals, and subclavian's.  Bilateral upper extremity extremities-impression bilateral radial artery with normal waveforms.  Compression of right ulnar decreases weightbearing by >50%, and compression of left ulnar obliterates waveform.  Echocardiogram 05/16/2023: LV size and function.  EF 55 to 60%.  No RWMA.  GR 1 DD (normal for age.  Normal RV size and function.  Unable to assess PAP.  Normal MV.  AV calcification-sclerosis with no stenosis.  Aortic root 40 mm.  Normal RAP.  No significant change to prior study.   Left heart catheterization 05/16/2023:   Dist LM lesion is 40% stenosed.   Ost LAD to Prox LAD lesion is 50% stenosed.   Prox LAD lesion is 80% stenosed.   Prox LAD to Mid LAD lesion is 50% stenosed.  Mid Cx lesion is 80% stenosed.  Right to left collaterals noted to the distal circumflex system.   Dist Cx lesion is 50% stenosed.;   The left ventricular systolic function is normal. EF 55-65%    Heavily calcified proximal to mid LAD.  Plaque extends from the distal left main into the ostial and proximal LAD.  Focal, 80% mid LAD lesion just prior to an ectatic segment in the mid LAD.  Treating this would  require calcium modification.   Compared to January, the slow flow in the circumflex with right to left collaterals to the distal circumflex is a new finding.  Difficult to tell whether the hypodense lesion in the mid circumflex is thrombus or calcium.  Discussed with Dr. Herbie Baltimore.  Will restart IV heparin.  Stop Plavix.  Start Brilinta.  There is severe tortuosity in the proximal circumflex which would make any type of intervention in the mid circumflex difficult.  Would favor medical therapy at this point.  _____________   History of Present Illness     Gregory [name] is a 89 y.o. 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   Patient has had 2 admissions in January of this year for NSTEMI.  During his first admission on 12/26/2022, catheterization showed to have nonobstructive coronary artery disease 50% stenosis to LAD but subsequently readmitted for NSTEMI again days later. Coronary angiography showed 80% proximal LAD stenosis just before the aneurysm segment that was decided to treat medically with consideration of coronary artery bypass given concerns of torturous stent placement and location.  Then now during this admission patient states that one morning he developed chest pain center of his chest that was burning and  radiated down both arms.  He did not have any associated symptoms of shortness of breath, nausea, sweating, lightheadedness, palpitations.  Due to ongoing pain his son drove him to the [facility] for evaluation and had resolving chest pain after receiving nitroglycerin tablets.  At [facility] she had elevated high sensitive troponins that went from 424-880 and he received  loading dose of aspirin and placed on heparin drip to transfer to our hospital.  EKG did not show any acute ST-T wave changes. Patient was taken for non emergent cardiac catherization once he arrived to our hospital.   Hospital Course     Consultants:    CAD/NSTEMI Patient with recent admissions in January 2024 with disease to the LAD now with repeat catheterization showing significant progression of disease in the LAD and new de novo lesion in the left circumflex. Normal LVEDP.  Echocardiogram is normal, LVEF 55 to 60%, with no wall motion abnormalities. Initially thought to be medically managed due to tortuous physiology and resistance to stent however after further consideration and ongoing symptoms with new Lcx lesion, Dr. Dorris Fetch has decided to perform CABG surgery planned for this Monday, May 22, 2023.   Continue aspirin 81 mg daily. Previously on Plavix, but switched to Brillinta. Brillinta has been stopped for upcoming procedure. Defer to Dr. Dorris Fetch when to restart. This has been discussed with patient and I have attached it in numerous places on his DC paperwork and have spoken to family. Also will stop fish oil and will advise NPO at midnight prior to surgery date.  Imdur was increased to 30 mg daily and addition of Renexa 500mg  BID.  Has had decrease in renal function post cath. Cr 1.54, GFR 44. Continue to monitor.  Preop vascular US is normal with mild disease in his carotids 1-39%. Not on BB given baseline bradycardia    Hypertension Continue home amlodipine   Carotid artery disease Continue aspirin and lipitor. LDL 44.   Lung nodule Scheduled for bronch however has been canceled due to chest pain in this admission. Dr. Dorris Fetch following.  Anemia  Stable    Did the patient have an acute coronary syndrome (MI, NSTEMI, STEMI, etc) this admission?:  Yes                               AHA/ACC Clinical Performance & Quality Measures: Aspirin prescribed? - Yes ADP  Receptor Inhibitor (Plavix/Clopidogrel, Brilinta/Ticagrelor or Effient/Prasugrel) prescribed (includes medically managed patients)? - Yes Beta Blocker prescribed? - No - intolerant  High Intensity Statin (Lipitor 40-80mg  or Crestor 20-40mg ) prescribed? - Yes EF assessed during THIS hospitalization? - Yes For EF <40%, was ACEI/ARB prescribed? - Not Applicable (EF >/= 40%) For EF <40%, Aldosterone Antagonist (Spironolactone or Eplerenone) prescribed? - Not Applicable (EF >/= 40%) Cardiac Rehab Phase II ordered (including medically managed patients)? - Yes     _____________  Discharge Vitals Blood pressure 138/76, pulse (!) 56, temperature 97.8 F (36.6 C), temperature source Oral, resp. rate 16, height 6' (1.829 m), weight 84.9 kg, SpO2 96 %.  Filed Weights   05/15/23 1838 05/15/23 2038  Weight: 83.9 kg 84.9 kg    Physical Exam  Constitutional: He appears healthy. No distress.  Eyes: Pupils are equal, round, and reactive to light.  Cardiovascular: Normal rate, regular rhythm and normal pulses.  Murmur heard. Pulmonary/Chest: Effort normal and breath sounds normal.  Abdominal: Soft.  Musculoskeletal:        General:  Normal range of motion.     Cervical back: Normal range of motion.  Neurological: He is alert and oriented to person, place, and time.  Skin: Skin is warm and dry.    Cath site has been evaluated and no signs of infection.   Labs & Radiologic Studies    CBC Recent Labs    05/15/23 1547 05/16/23 0327 05/17/23 0205  WBC 6.2 7.1 7.7  NEUTROABS 3.9  --   --   HGB 13.2 12.6* 12.5*  HCT 38.5* 37.9* 37.5*  MCV 88.5 89.4 89.1  PLT 163 148* 157   Basic Metabolic Panel Recent Labs    16/10/96 0205 05/18/23 0319  NA 137 135  K 3.8 3.7  CL 104 103  CO2 24 22  GLUCOSE 115* 128*  BUN 14 19  CREATININE 1.30* 1.54*  CALCIUM 8.9 8.9   Liver Function Tests No results for input(s): "AST", "ALT", "ALKPHOS", "BILITOT", "PROT", "ALBUMIN" in the last 72 hours. No  results for input(s): "LIPASE", "AMYLASE" in the last 72 hours. High Sensitivity Troponin:   Recent Labs  Lab 05/15/23 1547 05/15/23 1735  TROPONINIHS 424* 880*    BNP Invalid input(s): "POCBNP" D-Dimer No results for input(s): "DDIMER" in the last 72 hours. Hemoglobin A1C No results for input(s): "HGBA1C" in the last 72 hours. Fasting Lipid Panel Recent Labs    05/16/23 0327  CHOL 104  HDL 33*  LDLCALC 44  TRIG 045  CHOLHDL 3.2   Thyroid Function Tests No results for input(s): "TSH", "T4TOTAL", "T3FREE", "THYROIDAB" in the last 72 hours.  Invalid input(s): "FREET3" _____________  VAS US DOPPLER PRE CABG  Result Date: 05/18/2023 PREOPERATIVE VASCULAR EVALUATION Patient Name:  Gregory [name]  Date of Exam:   05/18/2023 Medical Rec #: [id number]        Accession #:    [id number] Date of Birth: Mar 21, 1934        Patient Gender: M Patient Age:   25 years Exam Location:  [facility] Procedure:      VAS US DOPPLER PRE CABG Referring Phys: Viviann Spare HENDRICKSON --------------------------------------------------------------------------------  Indications:            Pre-CABG. Risk Factors:           Hypertension, hyperlipidemia, past history of smoking,                         coronary artery disease, PAD. Vascular Interventions: Right CEA 2002. Performing Technologist: Sherren Kerns RVS  Examination Guidelines: A complete evaluation includes B-mode imaging, spectral Doppler, color Doppler, and power Doppler as needed of all accessible portions of each vessel. Bilateral testing is considered an integral part of a complete examination. Limited examinations for reoccurring indications may be performed as noted.  Right Carotid Findings: +----------+--------+--------+--------+------------+------------------+           PSV cm/sEDV cm/sStenosisDescribe    Comments           +----------+--------+--------+--------+------------+------------------+ CCA Prox  49      8                            intimal thickening +----------+--------+--------+--------+------------+------------------+ CCA Distal53      12                          intimal thickening +----------+--------+--------+--------+------------+------------------+ ICA Prox  38      11      1-39%  heterogenous                   +----------+--------+--------+--------+------------+------------------+ ICA Mid   34      10                                             +----------+--------+--------+--------+------------+------------------+ ICA Distal57      17                                             +----------+--------+--------+--------+------------+------------------+ ECA       73      11                                             +----------+--------+--------+--------+------------+------------------+ +----------+--------+-------+--------+------------+           PSV cm/sEDV cmsDescribeArm Pressure +----------+--------+-------+--------+------------+ Subclavian102                                 +----------+--------+-------+--------+------------+ +---------+--------+--+--------+-+ VertebralPSV cm/s30EDV cm/s9 +---------+--------+--+--------+-+ Left Carotid Findings: +----------+--------+--------+--------+--------+--------+           PSV cm/sEDV cm/sStenosisDescribeComments +----------+--------+--------+--------+--------+--------+ CCA Prox  64      14              calcific         +----------+--------+--------+--------+--------+--------+ CCA Distal65      19              calcific         +----------+--------+--------+--------+--------+--------+ ICA Prox  82      26      1-39%   calcific         +----------+--------+--------+--------+--------+--------+ ICA Mid   70      24                               +----------+--------+--------+--------+--------+--------+ ICA Distal46      16                                +----------+--------+--------+--------+--------+--------+ ECA       98      17                               +----------+--------+--------+--------+--------+--------+  +----------+--------+--------+--------+------------+ SubclavianPSV cm/sEDV cm/sDescribeArm Pressure +----------+--------+--------+--------+------------+           47                                   +----------+--------+--------+--------+------------+ +---------+--------+--+--------+--+ VertebralPSV cm/s35EDV cm/s11 +---------+--------+--+--------+--+  ABI Findings: +-----+------------------+-----+-----------+--------+ RightRt Pressure (mmHg)IndexWaveform   Comment  +-----+------------------+-----+-----------+--------+ ATA                         multiphasic         +-----+------------------+-----+-----------+--------+ PTA  multiphasic         +-----+------------------+-----+-----------+--------+ DP                          multiphasic         +-----+------------------+-----+-----------+--------+ +----+------------------+-----+----------+-------+ LeftLt Pressure (mmHg)IndexWaveform  Comment +----+------------------+-----+----------+-------+ ATA                        monophasic        +----+------------------+-----+----------+-------+ PTA                        biphasic          +----+------------------+-----+----------+-------+ DP                         monophasic        +----+------------------+-----+----------+-------+  Right Doppler Findings: +------+--------+-----+---------+--------+ Site  PressureIndexDoppler  Comments +------+--------+-----+---------+--------+ Radial             triphasic         +------+--------+-----+---------+--------+ Ulnar              triphasic         +------+--------+-----+---------+--------+  Left Doppler Findings: +------+--------+-----+---------+--------+ Site  PressureIndexDoppler  Comments  +------+--------+-----+---------+--------+ Radial             triphasic         +------+--------+-----+---------+--------+ Ulnar              triphasic         +------+--------+-----+---------+--------+   Summary: Right Carotid: Velocities in the right ICA are consistent with a 1-39% stenosis. Left Carotid: Velocities in the left ICA are consistent with a 1-39% stenosis. Vertebrals:  Bilateral vertebral arteries demonstrate antegrade flow. Subclavians: Normal flow hemodynamics were seen in bilateral subclavian              arteries. Right Upper Extremity: Doppler waveforms remain within normal limits with right radial compression. Doppler waveforms decrease >50% with right ulnar compression. Left Upper Extremity: Doppler waveforms remain within normal limits with left radial compression. Doppler waveform obliterate with left ulnar compression.     Preliminary    ECHOCARDIOGRAM COMPLETE  Result Date: 05/16/2023    ECHOCARDIOGRAM REPORT   Patient Name:   Gregory [name] Date of Exam: 05/16/2023 Medical Rec #:  [id number]       Height:       72.0 in Accession #:    [id number]      Weight:       187.2 lb Date of Birth:  [birth year]       BSA:          2.072 m Patient Age:    88 years        BP:           124/78 mmHg Patient Gender: M               HR:           64 bpm. Exam Location:  Inpatient Procedure: 2D Echo, Color Doppler and Cardiac Doppler Indications:    NSTEMI  History:        Patient has prior history of Echocardiogram examinations, most                 recent 12/24/2022. CAD; Risk Factors:Hypertension and                 Dyslipidemia.  Sonographer:  [facility] RDCS Referring Phys: [id number] [facility] H HENDERSON  Sonographer Comments: Technically difficult due to poor echo windows. IMPRESSIONS  1. Left ventricular ejection fraction, by estimation, is 55 to 60%. The left ventricle has normal function. The left ventricle has no regional wall motion abnormalities. There is mild asymmetric left  ventricular hypertrophy of the septal segment. Left ventricular diastolic parameters are consistent with Grade I diastolic dysfunction (impaired relaxation).  2. Right ventricular systolic function is normal. The right ventricular size is normal. Tricuspid regurgitation signal is inadequate for assessing PA pressure.  3. The mitral valve is grossly normal. Trivial mitral valve regurgitation. No evidence of mitral stenosis.  4. The aortic valve is calcified. There is mild calcification of the aortic valve. There is mild thickening of the aortic valve. Aortic valve regurgitation is trivial. Aortic valve sclerosis/calcification is present, without any evidence of aortic stenosis.  5. Aortic dilatation noted. There is mild dilatation of the aortic root, measuring 40 mm.  6. The inferior vena cava is normal in size with greater than 50% respiratory variability, suggesting right atrial pressure of 3 mmHg. Comparison(s): No significant change from prior study. FINDINGS  Left Ventricle: Left ventricular ejection fraction, by estimation, is 55 to 60%. The left ventricle has normal function. The left ventricle has no regional wall motion abnormalities. The left ventricular internal cavity size was normal in size. There is  mild asymmetric left ventricular hypertrophy of the septal segment. Left ventricular diastolic parameters are consistent with Grade I diastolic dysfunction (impaired relaxation). Right Ventricle: The right ventricular size is normal. No increase in right ventricular wall thickness. Right ventricular systolic function is normal. Tricuspid regurgitation signal is inadequate for assessing PA pressure. Left Atrium: Left atrial size was normal in size. Right Atrium: Right atrial size was normal in size. Pericardium: Trivial pericardial effusion is present. Presence of epicardial fat layer. Mitral Valve: The mitral valve is grossly normal. Mild mitral annular calcification. Trivial mitral valve regurgitation. No  evidence of mitral valve stenosis. Tricuspid Valve: The tricuspid valve is grossly normal. Tricuspid valve regurgitation is trivial. No evidence of tricuspid stenosis. Aortic Valve: The aortic valve is calcified. There is mild calcification of the aortic valve. There is mild thickening of the aortic valve. Aortic valve regurgitation is trivial. Aortic valve sclerosis/calcification is present, without any evidence of aortic stenosis. Aortic valve mean gradient measures 7.0 mmHg. Aortic valve peak gradient measures 12.7 mmHg. Aortic valve area, by VTI measures 2.92 cm. Pulmonic Valve: The pulmonic valve was normal in structure. Pulmonic valve regurgitation is trivial. No evidence of pulmonic stenosis. Aorta: Aortic dilatation noted. There is mild dilatation of the aortic root, measuring 40 mm. Venous: The inferior vena cava is normal in size with greater than 50% respiratory variability, suggesting right atrial pressure of 3 mmHg. IAS/Shunts: The atrial septum is grossly normal.  LEFT VENTRICLE PLAX 2D LVIDd:         4.60 cm   Diastology LVIDs:         3.50 cm   LV e' medial:    4.35 cm/s LV PW:         0.90 cm   LV E/e' medial:  9.5 LV IVS:        1.20 cm   LV e' lateral:   6.74 cm/s LVOT diam:     2.60 cm   LV E/e' lateral: 6.1 LV SV:         103 LV SV Index:   50 LVOT Area:  5.31 cm  RIGHT VENTRICLE RV S prime:     14.00 cm/s TAPSE (M-mode): 2.1 cm LEFT ATRIUM             Index        RIGHT ATRIUM           Index LA diam:        3.10 cm 1.50 cm/m   RA Area:     18.10 cm LA Vol (A2C):   49.5 ml 23.90 ml/m  RA Volume:   45.00 ml  21.72 ml/m LA Vol (A4C):   53.5 ml 25.83 ml/m LA Biplane Vol: 53.3 ml 25.73 ml/m  AORTIC VALVE AV Area (Vmax):    2.77 cm AV Area (Vmean):   3.03 cm AV Area (VTI):     2.92 cm AV Vmax:           178.00 cm/s AV Vmean:          126.000 cm/s AV VTI:            0.353 m AV Peak Grad:      12.7 mmHg AV Mean Grad:      7.0 mmHg LVOT Vmax:         92.80 cm/s LVOT Vmean:        71.900  cm/s LVOT VTI:          0.194 m LVOT/AV VTI ratio: 0.55  AORTA Ao Root diam: 4.00 cm Ao Asc diam:  3.90 cm MITRAL VALVE MV Area (PHT): 2.11 cm    SHUNTS MV Decel Time: 359 msec    Systemic VTI:  0.19 m MV E velocity: 41.30 cm/s  Systemic Diam: 2.60 cm MV A velocity: 86.60 cm/s MV E/A ratio:  0.48 Lennie Odor MD Electronically signed by Lennie Odor MD Signature Date/Time: 05/16/2023/2:36:15 PM    Final    CARDIAC CATHETERIZATION  Result Date: 05/16/2023   Prox LAD to Mid LAD lesion is 50% stenosed.   Dist Cx lesion is 50% stenosed.   Ost LAD to Prox LAD lesion is 50% stenosed.   Dist LM lesion is 40% stenosed.   Prox LAD lesion is 80% stenosed.   Mid Cx lesion is 80% stenosed.  Right to left collaterals noted to the distal circumflex system   The left ventricular systolic function is normal.   LV end diastolic pressure is normal.   The left ventricular ejection fraction is 55-65% by visual estimate.   There is no aortic valve stenosis.   JL4 needed for left main engagement. Heavily calcified proximal to mid LAD.  Plaque extends from the distal left main into the ostial and proximal LAD.  Focal, 80% mid LAD lesion just prior to an ectatic segment in the mid LAD.  Treating this would require calcium modification. Compared to January, the slow flow in the circumflex with right to left collaterals to the distal circumflex is a new finding.  Difficult to tell whether the hypodense lesion in the mid circumflex is thrombus or calcium.  Discussed with Dr. Herbie Baltimore.  Will restart IV heparin.  Stop Plavix.  Start Brilinta.  There is severe tortuosity in the proximal circumflex which would make any type of intervention in the mid circumflex difficult.  Would favor medical therapy at this point.   DG Chest Port 1 View  Result Date: 05/15/2023 CLINICAL DATA:  Chest pain. EXAM: PORTABLE CHEST 1 VIEW COMPARISON:  04/25/2023 radiograph and CT FINDINGS: Lower lung volumes from prior exam. The known right upper lobe  pulmonary nodule is faintly visualized. Heterogeneous opacity in the lung bases. The heart is normal in size. Stable mediastinal contours. Aortic atherosclerosis. No pneumothorax or large pleural effusion. IMPRESSION: Lower lung volumes from prior exam with heterogeneous opacity in the lung bases, may be atelectasis, pneumonia, or aspiration. Known right upper lobe nodule is faintly visualized. Electronically Signed   By: Narda Rutherford M.D.   On: 05/15/2023 16:09   DG Chest 2 View  Result Date: 04/28/2023 CLINICAL DATA:  Preoperative chest x-ray. Right upper lobe lung lesion. EXAM: CHEST - 2 VIEW COMPARISON:  Chest x-ray 12/28/2022 FINDINGS: The cardiac silhouette, mediastinal and hilar contours are within normal limits and stable. Stable tortuosity and calcification of the thoracic aorta. Stable right upper lobe pulmonary lesion. Underlying emphysematous changes and pulmonary scarring. No acute pulmonary process. No pleural effusions. The bony thorax is intact. IMPRESSION: 1. Stable right upper lobe pulmonary lesion. 2. Underlying emphysematous changes and pulmonary scarring. Electronically Signed   By: Rudie Meyer M.D.   On: 04/28/2023 10:15   NM PET SUPER D CT  Result Date: 04/28/2023 CLINICAL DATA:  Pre EMB biopsy.  Right upper lobe lung lesion. EXAM: CT CHEST WITHOUT CONTRAST TECHNIQUE: Multidetector CT imaging of the chest was performed using thin slice collimation for electromagnetic bronchoscopy planning purposes, without intravenous contrast. RADIATION DOSE REDUCTION: This exam was performed according to the departmental dose-optimization program which includes automated exposure control, adjustment of the mA and/or kV according to patient size and/or use of iterative reconstruction technique. COMPARISON:  CT scan 03/20/2023 FINDINGS: Cardiovascular: The heart is normal size. No pericardial effusion. Advanced aortic and coronary artery calcifications but no aneurysm. Mediastinum/Nodes: Small  scattered mediastinal and hilar lymph nodes but no mass or adenopathy. The esophagus is grossly normal. Lungs/Pleura: Stable mildly spiculated 15 mm right upper lobe pulmonary nodule consistent with primary lung neoplasm. Emphysematous changes and pulmonary scarring. No findings for pulmonary metastatic disease. No pleural effusions or pleural nodules. Upper Abdomen: No significant upper abdominal findings. No hepatic adrenal gland lesions. Scattered calcified granulomas in the liver. Musculoskeletal: No chest wall mass, supraclavicular or axillary adenopathy. The bony thorax is intact. IMPRESSION: 1. Stable 15 mm right upper lobe pulmonary nodule consistent with primary lung neoplasm. 2. No mediastinal or hilar mass or adenopathy. 3. Stable underlying emphysematous changes and pulmonary scarring and stable advanced vascular disease. Aortic Atherosclerosis (ICD10-I70.0) and Emphysema (ICD10-J43.9). Electronically Signed   By: Rudie Meyer M.D.   On: 04/28/2023 10:14   NM PET Image Initial (PI) Skull Base To Thigh  Result Date: 04/28/2023 CLINICAL DATA:  Initial treatment strategy for right upper lobe pulmonary lesion. EXAM: NUCLEAR MEDICINE PET SKULL BASE TO THIGH TECHNIQUE: 9.2 mCi F-18 FDG was injected intravenously. Full-ring PET imaging was performed from the skull base to thigh after the radiotracer. CT data was obtained and used for attenuation correction and anatomic localization. Fasting blood glucose: 111 mg/dl COMPARISON:  Chest CT 47/82/9562 FINDINGS: Mediastinal blood pool activity: SUV max 2.36 Liver activity: SUV max NA NECK: No hypermetabolic lymph nodes in the neck. Incidental CT findings: Bilateral carotid artery calcifications. CHEST: 12 mm right upper lobe pulmonary lesion is hypermetabolic with SUV max of 6.19 and consistent with primary lung neoplasm. No enlarged or hypermetabolic mediastinal or hilar lymph nodes to suggest metastatic adenopathy. No supraclavicular or axillary adenopathy.  No other pulmonary nodules to suggest pulmonary metastatic disease. Incidental CT findings: Stable underlying emphysematous changes and pulmonary scarring. Stable aortic and three-vessel coronary artery disease. ABDOMEN/PELVIS: No abnormal hypermetabolic  activity within the liver, pancreas, adrenal glands, or spleen. No hypermetabolic lymph nodes in the abdomen or pelvis. Incidental CT findings: 4.2 cm infrarenal abdominal aortic aneurysm unchanged since abdominal CT scan 12/23/2022. Recommend follow-up every 12 months and vascular consultation. This recommendation follows ACR consensus guidelines: White Paper of the ACR Incidental Findings Committee II on Vascular Findings. J Am Coll Radiol 2013; 10:789-794. sigmoid colon diverticulosis. Simple left renal cyst not requiring any further imaging evaluation or follow-up. Uniformly thick walled bladder could be due to lack of distension. Mild prostate gland enlargement. SKELETON: No focal hypermetabolic activity to suggest skeletal metastasis. Incidental CT findings: None. IMPRESSION: 1. 12 mm hypermetabolic right upper lobe pulmonary lesion consistent with primary lung neoplasm. No findings for metastatic disease involving the neck, chest, abdomen/pelvis or bony structures. 2. 4.2 cm infrarenal abdominal aortic aneurysm. 3. Stable emphysematous changes, pulmonary scarring and vascular disease. Electronically Signed   By: Rudie Meyer M.D.   On: 04/28/2023 10:11   DG C-ARM BRONCHOSCOPY  Result Date: 04/27/2023 C-ARM BRONCHOSCOPY: Fluoroscopy was utilized by the requesting physician.  No radiographic interpretation.   Disposition   Pt is being discharged home today in good condition.  Follow-up Plans & Appointments     Follow-up Information     [facility] Follow up.   Why: Surgery is scheduled for 0530 on 05/22/2023. Make sure you do not eat or drink any thing beyond midnight the night before. Also, do not take your plavix or your fish oil  until directed to do so after your procedure. Contact information: [address] [place] [address] [phone]               Discharge Instructions     AMB referral to Phase II Cardiac Rehabilitation   Complete by: As directed    Diagnosis: NSTEMI   After initial evaluation and assessments completed: Virtual Based Care may be provided alone or in conjunction with Phase 2 Cardiac Rehab based on patient barriers.: Yes   Intensive Cardiac Rehabilitation (ICR) [facility] location only OR Traditional Cardiac Rehabilitation (TCR) *If criteria for ICR are not met will enroll in TCR [facility] only): Yes   Diet - low sodium heart healthy   Complete by: As directed    Discharge instructions   Complete by: As directed    Radial Site Care Refer to this sheet in the next few weeks. These instructions provide you with information on caring for yourself after your procedure. Your caregiver may also give you more specific instructions. Your treatment has been planned according to current medical practices, but problems sometimes occur. Call your caregiver if you have any problems or questions after your procedure. HOME CARE INSTRUCTIONS You may shower the day after the procedure. Remove the bandage (dressing) and gently wash the site with plain soap and water. Gently pat the site dry.  Do not apply powder or lotion to the site.  Do not submerge the affected site in water for 3 to 5 days.  Inspect the site at least twice daily.  Do not flex or bend the affected arm for 24 hours.  No lifting over 5 pounds (2.3 kg) for 5 days after your procedure.  Do not drive home if you are discharged the same day of the procedure. Have someone else drive you.  You may drive 24 hours after the procedure unless otherwise instructed by your caregiver.  What to expect: Any bruising will usually fade within 1 to 2 weeks.  Blood  that collects in the tissue (hematoma) may be painful to the touch.  It should usually decrease in size and tenderness within 1 to 2 weeks.  SEEK IMMEDIATE MEDICAL CARE IF: You have unusual pain at the radial site.  You have redness, warmth, swelling, or pain at the radial site.  You have drainage (other than a small amount of blood on the dressing).  You have chills.  You have a fever or persistent symptoms for more than 72 hours.  You have a fever and your symptoms suddenly get worse.  Your arm becomes pale, cool, tingly, or numb.  You have heavy bleeding from the site. Hold pressure on the site.    Do not take your Plavix at home in anticipation for your upcoming procedure.  Do not restart until instructed to do so by Dr. Dorris Fetch.   Increase activity slowly   Complete by: As directed         Discharge Medications   Allergies as of 05/18/2023   No Known Allergies      Medication List     STOP taking these medications    clopidogrel 75 MG tablet Commonly known as: PLAVIX       TAKE these medications    ALPRAZolam 0.5 MG tablet Commonly known as: XANAX Take 0.5 mg by mouth at bedtime. Take 0.5 mg in the morning and 1 mg at bedtime   amLODipine 5 MG tablet Commonly known as: NORVASC Take 5 mg by mouth daily.   aspirin 81 MG tablet Take 81 mg by mouth daily.   atorvastatin 80 MG tablet Commonly known as: LIPITOR Take 1 tablet (80 mg total) by mouth daily.   CENTRUM ADULTS PO Take 1 tablet by mouth daily.   FISH OIL PO Take 1 capsule by mouth daily.   isosorbide mononitrate 30 MG 24 hr tablet Commonly known as: IMDUR Take 1 tablet (30 mg total) by mouth daily. What changed: how much to take   mirtazapine 7.5 MG tablet Commonly known as: REMERON TAKE 1 TABLET(7.5 MG) BY MOUTH AT BEDTIME What changed: See the new instructions.   nitroGLYCERIN 0.4 MG SL tablet Commonly known as: Nitrostat Place 1 tablet (0.4 mg total) under the tongue every 5 (five) minutes as needed for chest pain (up to 3 doses. If taking 3rd  dose, call 911).   ranolazine 500 MG 12 hr tablet Commonly known as: RANEXA Take 1 tablet (500 mg total) by mouth 2 (two) times daily.   sertraline 50 MG tablet Commonly known as: ZOLOFT TAKE 3 TABLETS(150 MG) BY MOUTH DAILY What changed: See the new instructions.   tamsulosin 0.4 MG Caps capsule Commonly known as: FLOMAX Take 0.4 mg by mouth at bedtime.   UNABLE TO FIND Take 1 tablet by mouth daily. Bone supplement   ZINC PO Take by mouth.           Outstanding Labs/Studies    Duration of Discharge Encounter   Greater than 30 minutes including physician time.  Signed, Abagail Kitchens, PA-C 05/18/2023, 12:16 PM   ATTENDING ATTESTATION  I have seen, examined and evaluated the patient this morning on rounds along with Abagail Kitchens, PA-C.  After reviewing all the available data and chart, we discussed the patients laboratory, study & physical findings as well as symptoms in detail.  I agree with his findings, examination as well as impression recommendations as per our discussion.    Attending adjustments noted in italics.   Overall doing well today.  Talked with Dr. Orson Aloe yesterday and he was in favor of proceeding with CABG.  With that in mind, we have stopped his Thienopyridine (having switched from Plavix to Brilinta) now holding Brilinta for upcoming surgery hopefully on Monday, June 3.  He is feeling fine.  No active angina that we have titrated up antianginal medications as noted.  I suspect that some of his medicines may be elevated once he has had CABG.  Would recommend restarting at a minimum Plavix post CABG.   Marykay Lex, MD, MS Bryan Lemma, M.D., M.S. Interventional Cardiologist  [facility]  Pager # [phone] Phone # [phone] [address] [place], [place] [address]

## 2023-05-17 NOTE — Progress Notes (Addendum)
Rounding Note    Patient Name: Gregory [name] Date of Encounter: 05/17/2023  [facility] Cardiologist: Rollene Rotunda, MD   Subjective   Patient feeling good today without any chest pain.  We had a very long discussion with him and his son about treatment plan  Inpatient Medications    Scheduled Meds:  ALPRAZolam  0.5 mg Oral QHS   amLODipine  5 mg Oral Daily   aspirin EC  81 mg Oral Daily   atorvastatin  80 mg Oral Daily   isosorbide mononitrate  15 mg Oral Daily   mirtazapine  7.5 mg Oral QHS   sertraline  150 mg Oral Daily   sodium chloride flush  3 mL Intravenous Q12H   sodium chloride flush  3 mL Intravenous Q12H   tamsulosin  0.4 mg Oral QHS   ticagrelor  90 mg Oral BID   Continuous Infusions:  sodium chloride     heparin 1,150 Units/hr (05/17/23 0529)   PRN Meds: sodium chloride, acetaminophen, nitroGLYCERIN, ondansetron (ZOFRAN) IV, sodium chloride flush   Vital Signs    Vitals:   05/16/23 2000 05/17/23 0007 05/17/23 0357 05/17/23 0838  BP: (!) 144/80 127/79 113/74 (!) 148/72  Pulse: 66 60 62 62  Resp: 16 16 18 16   Temp: 97.6 F (36.4 C) 98.6 F (37 C) 97.9 F (36.6 C) 98 F (36.7 C)  TempSrc: Oral Oral Oral Oral  SpO2: 96% 95% 93% 93%  Weight:      Height:        Intake/Output Summary (Last 24 hours) at 05/17/2023 0923 Last data filed at 05/17/2023 0529 Gross per 24 hour  Intake 400.32 ml  Output --  Net 400.32 ml      05/15/2023    8:38 PM 05/15/2023    6:38 PM 05/03/2023    3:41 PM  Last 3 Weights  Weight (lbs) 187 lb 3.2 oz 184 lb 15.5 oz 185 lb  Weight (kg) 84.913 kg 83.9 kg 83.915 kg      Telemetry    Normal sinus rhythm heart rate 70s, ST depression- Personally Reviewed  ECG    Sinus brady HR 50-60s.  Diffuse ST depressions in inferolateral leads. - Personally Reviewed  Physical Exam   GEN: No acute distress.   Neck: No JVD Cardiac: RRR 3/6 murmur Respiratory: Clear to auscultation bilaterally. GI: Soft,  nontender, non-distended  MS: No edema; No deformity. R radial cath site free on clear signs of infection.  Neuro:  Nonfocal  Psych: Normal affect   Labs    High Sensitivity Troponin:   Recent Labs  Lab 05/15/23 1547 05/15/23 1735  TROPONINIHS 424* 880*     Chemistry Recent Labs  Lab 05/15/23 1547 05/16/23 0327 05/17/23 0205  NA 139 137 137  K 4.1 3.9 3.8  CL 104 105 104  CO2 25 24 24   GLUCOSE 206* 125* 115*  BUN 16 13 14   CREATININE 1.13 1.13 1.30*  CALCIUM 9.7 8.7* 8.9  GFRNONAA >60 >60 54*  ANIONGAP 10 8 9     Lipids  Recent Labs  Lab 05/16/23 0327  CHOL 104  TRIG 133  HDL 33*  LDLCALC 44  CHOLHDL 3.2    Hematology Recent Labs  Lab 05/15/23 1547 05/16/23 0327 05/17/23 0205  WBC 6.2 7.1 7.7  RBC 4.35 4.24 4.21*  HGB 13.2 12.6* 12.5*  HCT 38.5* 37.9* 37.5*  MCV 88.5 89.4 89.1  MCH 30.3 29.7 29.7  MCHC 34.3 33.2 33.3  RDW 12.7 12.7  12.8  PLT 163 148* 157   Thyroid No results for input(s): "TSH", "FREET4" in the last 168 hours.  BNPNo results for input(s): "BNP", "PROBNP" in the last 168 hours.  DDimer No results for input(s): "DDIMER" in the last 168 hours.   Radiology    ECHOCARDIOGRAM COMPLETE  Result Date: 05/16/2023    ECHOCARDIOGRAM REPORT   Patient Name:   Gregory [name] Husser Date of Exam: 05/16/2023 Medical Rec #:  [id number]       Height:       72.0 in Accession #:    [id number]      Weight:       187.2 lb Date of Birth:  02/09/1934       BSA:          2.072 m Patient Age:    89 years        BP:           124/78 mmHg Patient Gender: M               HR:           64 bpm. Exam Location:  Inpatient Procedure: 2D Echo, Color Doppler and Cardiac Doppler Indications:    NSTEMI  History:        Patient has prior history of Echocardiogram examinations, most                 recent 12/24/2022. CAD; Risk Factors:Hypertension and                 Dyslipidemia.  Sonographer:    Irving Burton Senior RDCS Referring Phys: [id number] [facility] H HENDERSON  Sonographer Comments:  Technically difficult due to poor echo windows. IMPRESSIONS  1. Left ventricular ejection fraction, by estimation, is 55 to 60%. The left ventricle has normal function. The left ventricle has no regional wall motion abnormalities. There is mild asymmetric left ventricular hypertrophy of the septal segment. Left ventricular diastolic parameters are consistent with Grade I diastolic dysfunction (impaired relaxation).  2. Right ventricular systolic function is normal. The right ventricular size is normal. Tricuspid regurgitation signal is inadequate for assessing PA pressure.  3. The mitral valve is grossly normal. Trivial mitral valve regurgitation. No evidence of mitral stenosis.  4. The aortic valve is calcified. There is mild calcification of the aortic valve. There is mild thickening of the aortic valve. Aortic valve regurgitation is trivial. Aortic valve sclerosis/calcification is present, without any evidence of aortic stenosis.  5. Aortic dilatation noted. There is mild dilatation of the aortic root, measuring 40 mm.  6. The inferior vena cava is normal in size with greater than 50% respiratory variability, suggesting right atrial pressure of 3 mmHg. Comparison(s): No significant change from prior study. FINDINGS  Left Ventricle: Left ventricular ejection fraction, by estimation, is 55 to 60%. The left ventricle has normal function. The left ventricle has no regional wall motion abnormalities. The left ventricular internal cavity size was normal in size. There is  mild asymmetric left ventricular hypertrophy of the septal segment. Left ventricular diastolic parameters are consistent with Grade I diastolic dysfunction (impaired relaxation). Right Ventricle: The right ventricular size is normal. No increase in right ventricular wall thickness. Right ventricular systolic function is normal. Tricuspid regurgitation signal is inadequate for assessing PA pressure. Left Atrium: Left atrial size was normal in size.  Right Atrium: Right atrial size was normal in size. Pericardium: Trivial pericardial effusion is present. Presence of epicardial fat layer. Mitral Valve: The mitral valve is grossly  normal. Mild mitral annular calcification. Trivial mitral valve regurgitation. No evidence of mitral valve stenosis. Tricuspid Valve: The tricuspid valve is grossly normal. Tricuspid valve regurgitation is trivial. No evidence of tricuspid stenosis. Aortic Valve: The aortic valve is calcified. There is mild calcification of the aortic valve. There is mild thickening of the aortic valve. Aortic valve regurgitation is trivial. Aortic valve sclerosis/calcification is present, without any evidence of aortic stenosis. Aortic valve mean gradient measures 7.0 mmHg. Aortic valve peak gradient measures 12.7 mmHg. Aortic valve area, by VTI measures 2.92 cm. Pulmonic Valve: The pulmonic valve was normal in structure. Pulmonic valve regurgitation is trivial. No evidence of pulmonic stenosis. Aorta: Aortic dilatation noted. There is mild dilatation of the aortic root, measuring 40 mm. Venous: The inferior vena cava is normal in size with greater than 50% respiratory variability, suggesting right atrial pressure of 3 mmHg. IAS/Shunts: The atrial septum is grossly normal.  LEFT VENTRICLE PLAX 2D LVIDd:         4.60 cm   Diastology LVIDs:         3.50 cm   LV e' medial:    4.35 cm/s LV PW:         0.90 cm   LV E/e' medial:  9.5 LV IVS:        1.20 cm   LV e' lateral:   6.74 cm/s LVOT diam:     2.60 cm   LV E/e' lateral: 6.1 LV SV:         103 LV SV Index:   50 LVOT Area:     5.31 cm  RIGHT VENTRICLE RV S prime:     14.00 cm/s TAPSE (M-mode): 2.1 cm LEFT ATRIUM             Index        RIGHT ATRIUM           Index LA diam:        3.10 cm 1.50 cm/m   RA Area:     18.10 cm LA Vol (A2C):   49.5 ml 23.90 ml/m  RA Volume:   45.00 ml  21.72 ml/m LA Vol (A4C):   53.5 ml 25.83 ml/m LA Biplane Vol: 53.3 ml 25.73 ml/m  AORTIC VALVE AV Area (Vmax):    2.77  cm AV Area (Vmean):   3.03 cm AV Area (VTI):     2.92 cm AV Vmax:           178.00 cm/s AV Vmean:          126.000 cm/s AV VTI:            0.353 m AV Peak Grad:      12.7 mmHg AV Mean Grad:      7.0 mmHg LVOT Vmax:         92.80 cm/s LVOT Vmean:        71.900 cm/s LVOT VTI:          0.194 m LVOT/AV VTI ratio: 0.55  AORTA Ao Root diam: 4.00 cm Ao Asc diam:  3.90 cm MITRAL VALVE MV Area (PHT): 2.11 cm    SHUNTS MV Decel Time: 359 msec    Systemic VTI:  0.19 m MV E velocity: 41.30 cm/s  Systemic Diam: 2.60 cm MV A velocity: 86.60 cm/s MV E/A ratio:  0.48 Lennie Odor MD Electronically signed by Lennie Odor MD Signature Date/Time: 05/16/2023/2:36:15 PM    Final    CARDIAC CATHETERIZATION  Result Date: 05/16/2023   Prox  LAD to Mid LAD lesion is 50% stenosed.   Dist Cx lesion is 50% stenosed.   Ost LAD to Prox LAD lesion is 50% stenosed.   Dist LM lesion is 40% stenosed.   Prox LAD lesion is 80% stenosed.   Mid Cx lesion is 80% stenosed.  Right to left collaterals noted to the distal circumflex system   The left ventricular systolic function is normal.   LV end diastolic pressure is normal.   The left ventricular ejection fraction is 55-65% by visual estimate.   There is no aortic valve stenosis.   JL4 needed for left main engagement. Heavily calcified proximal to mid LAD.  Plaque extends from the distal left main into the ostial and proximal LAD.  Focal, 80% mid LAD lesion just prior to an ectatic segment in the mid LAD.  Treating this would require calcium modification. Compared to January, the slow flow in the circumflex with right to left collaterals to the distal circumflex is a new finding.  Difficult to tell whether the hypodense lesion in the mid circumflex is thrombus or calcium.  Discussed with Dr. Herbie Baltimore.  Will restart IV heparin.  Stop Plavix.  Start Brilinta.  There is severe tortuosity in the proximal circumflex which would make any type of intervention in the mid circumflex difficult.  Would  favor medical therapy at this point.   DG Chest Port 1 View  Result Date: 05/15/2023 CLINICAL DATA:  Chest pain. EXAM: PORTABLE CHEST 1 VIEW COMPARISON:  04/25/2023 radiograph and CT FINDINGS: Lower lung volumes from prior exam. The known right upper lobe pulmonary nodule is faintly visualized. Heterogeneous opacity in the lung bases. The heart is normal in size. Stable mediastinal contours. Aortic atherosclerosis. No pneumothorax or large pleural effusion. IMPRESSION: Lower lung volumes from prior exam with heterogeneous opacity in the lung bases, may be atelectasis, pneumonia, or aspiration. Known right upper lobe nodule is faintly visualized. Electronically Signed   By: Narda Rutherford M.D.   On: 05/15/2023 16:09    Cardiac Studies   Echocardiogram 05/16/2023 1. Left ventricular ejection fraction, by estimation, is 55 to 60%. The  left ventricle has normal function. The left ventricle has no regional  wall motion abnormalities. There is mild asymmetric left ventricular  hypertrophy of the septal segment. Left  ventricular diastolic parameters are consistent with Grade I diastolic  dysfunction (impaired relaxation).   2. Right ventricular systolic function is normal. The right ventricular  size is normal. Tricuspid regurgitation signal is inadequate for assessing  PA pressure.   3. The mitral valve is grossly normal. Trivial mitral valve  regurgitation. No evidence of mitral stenosis.   4. The aortic valve is calcified. There is mild calcification of the  aortic valve. There is mild thickening of the aortic valve. Aortic valve  regurgitation is trivial. Aortic valve sclerosis/calcification is present,  without any evidence of aortic  stenosis.   5. Aortic dilatation noted. There is mild dilatation of the aortic root,  measuring 40 mm.   6. The inferior vena cava is normal in size with greater than 50%  respiratory variability, suggesting right atrial pressure of 3 mmHg.    Comparison(s): No significant change from prior study.   Left heart catheterization 05/16/2023 Prox LAD to Mid LAD lesion is 50% stenosed.   Dist Cx lesion is 50% stenosed.   Ost LAD to Prox LAD lesion is 50% stenosed.   Dist LM lesion is 40% stenosed.   Prox LAD lesion is 80% stenosed.  Mid Cx lesion is 80% stenosed.  Right to left collaterals noted to the distal circumflex system   The left ventricular systolic function is normal.   LV end diastolic pressure is normal.   The left ventricular ejection fraction is 55-65% by visual estimate.   There is no aortic valve stenosis.   JL4 needed for left main engagement.   Heavily calcified proximal to mid LAD.  Plaque extends from the distal left main into the ostial and proximal LAD.  Focal, 80% mid LAD lesion just prior to an ectatic segment in the mid LAD.  Treating this would require calcium modification.   Compared to January, the slow flow in the circumflex with right to left collaterals to the distal circumflex is a new finding.  Difficult to tell whether the hypodense lesion in the mid circumflex is thrombus or calcium.  Discussed with Dr. Herbie Baltimore.  Will restart IV heparin.  Stop Plavix.  Start Brilinta.  There is severe tortuosity in the proximal circumflex which would make any type of intervention in the mid circumflex difficult.  Would favor medical therapy at this point.     Patient Profile     89 y.o. 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   Assessment & Plan    CAD/NSTEMI Patient with recent admission in January 2024 with disease to the LAD now with repeat  catheterization showing significant progression of disease in the LAD and new de novo lesion in the left circumflex.  As it stands patient would not be a great candidate for CABG given "LAD PCI would only treat half of the disease and will be very complicated with extensive atherectomy required, and with ostial LAD and distal left main disease there is not a good landing zone necessarily to land a stent. The poststenotic aneurysmal segment makes it also very difficult to land a stent appropriately. The LCx lesion is likely not approachable from a percutaneous standpoint. " Per Dr. Herbie Baltimore.  Patient and patient's son  not entirely agreeable to plan for medical management however will continue to discuss and answer all questions.  We discussed at great length today about pros and cons of undergoing procedure versus medical management.  Plan is to medically manage antianginal medications to improve quality of life, will wait for Dr. Sunday Corn recs.  Echocardiogram is normal LVEF 55 to 60%, with no wall motion abnormalities. Continue DAPT therapy with aspirin 81 mg daily and Brilinta 90 mg twice daily. Currently on IV heparin.  Can stop at 1800 today for total of 48 hours. Increasing Imdur to 30 mg daily. This was recently reduced for unknown reasons.  Will titrate conservatively. Also adding on Renexa 500mg  BID.  Slight decrease in renal function post cath. Continue to monitor.  D/w Dr. Dorris Fetch who believes that he would be favorable for CABG -- will wait to determine OR date & plan to hold Brilinta 5-7 d pre-op.   Otherwise continue antianginal medications--> with plans to treat medically for now - d/c home until CABG - Planned for Monday June 3/.   Hypertension Continue home amlodipine  Carotid artery disease Continue aspirin and lipitor. LDL 44.  Lung nodule Scheduled for bronch however has been  canceled due to chest pain in this admission. Dr. Dorris Fetch following.    For questions or  updates, please contact [facility] Please consult [email or web address] for contact info under        Signed, Abagail Kitchens, PA-C  05/17/2023, 9:23 AM      ATTENDING ATTESTATION  I have seen, examined and evaluated the patient this AM along with Yvonna Alanis, PA-C.  After reviewing all the available data and chart, we discussed the patients laboratory, study & physical findings as well as symptoms in detail.  I agree with his findings, examination as well as impression recommendations as per our discussion.    Attending adjustments noted in italics.   Doing well - titrating Antianginals- plan CABG on Monday 6/3 with Dr. Dorris Fetch  Stop Brilinta today.    Marykay Lex, MD, MS Bryan Lemma, M.D., M.S. Interventional Cardiologist  [facility]  Pager # [phone] Phone # [phone] [address] [place], [place] [address]

## 2023-05-17 NOTE — Progress Notes (Signed)
ANTICOAGULATION CONSULT NOTE  Pharmacy Consult for Heparin Indication: chest pain/ACS  No Known Allergies  Patient Measurements: Height: 6' (182.9 cm) Weight: 84.9 kg (187 lb 3.2 oz) IBW/kg (Calculated) : 77.6 Heparin Dosing Weight: 83.9 kg  Vital Signs: Temp: 98 F (36.7 C) (05/29 0838) Temp Source: Oral (05/29 0838) BP: 148/72 (05/29 0838) Pulse Rate: 62 (05/29 0838)  Labs: Recent Labs    05/15/23 1547 05/15/23 1735 05/16/23 0327 05/16/23 0909 05/17/23 0205  HGB 13.2  --  12.6*  --  12.5*  HCT 38.5*  --  37.9*  --  37.5*  PLT 163  --  148*  --  157  HEPARINUNFRC  --   --  0.40 0.45 0.30  CREATININE 1.13  --  1.13  --  1.30*  TROPONINIHS 424* 880*  --   --   --      Estimated Creatinine Clearance: 44.8 mL/min (A) (by C-G formula based on SCr of 1.3 mg/dL (H)).   Medical History: Past Medical History:  Diagnosis Date   Abdominal aneurysm (HCC)    Anginal pain (HCC)    Depression    History of kidney stones    Hyperlipidemia    Hypertension    Myocardial infarction [facility]) 04/19/2023   NSTEMI   Stroke [facility]) 2004?   TIA    Medications:  Medications Prior to Admission  Medication Sig Dispense Refill Last Dose   ALPRAZolam (XANAX) 0.5 MG tablet Take 0.5 mg by mouth at bedtime. Take 0.5 mg in the morning and 1 mg at bedtime   05/14/2023   amLODipine (NORVASC) 5 MG tablet Take 5 mg by mouth daily.   05/15/2023   aspirin 81 MG tablet Take 81 mg by mouth daily.   05/15/2023   atorvastatin (LIPITOR) 80 MG tablet Take 1 tablet (80 mg total) by mouth daily. 90 tablet 3 05/15/2023   clopidogrel (PLAVIX) 75 MG tablet Take 75 mg by mouth daily.   05/15/2023   isosorbide mononitrate (IMDUR) 30 MG 24 hr tablet Take 0.5 tablets (15 mg total) by mouth daily. 30 tablet 5 05/15/2023   mirtazapine (REMERON) 7.5 MG tablet TAKE 1 TABLET(7.5 MG) BY MOUTH AT BEDTIME (Patient taking differently: Take 7.5 mg by mouth at bedtime.) 90 tablet 1 05/14/2023   Multiple Vitamins-Minerals  (CENTRUM ADULTS PO) Take 1 tablet by mouth daily.    05/15/2023   Multiple Vitamins-Minerals (ZINC PO) Take by mouth.   05/14/2023   nitroGLYCERIN (NITROSTAT) 0.4 MG SL tablet Place 1 tablet (0.4 mg total) under the tongue every 5 (five) minutes as needed for chest pain (up to 3 doses. If taking 3rd dose, call 911). 25 tablet 3 05/15/2023   Omega-3 Fatty Acids (FISH OIL PO) Take 1 capsule by mouth daily.   05/15/2023   sertraline (ZOLOFT) 50 MG tablet TAKE 3 TABLETS(150 MG) BY MOUTH DAILY (Patient taking differently: Take 150 mg by mouth daily.) 270 tablet 1 05/15/2023   tamsulosin (FLOMAX) 0.4 MG CAPS capsule Take 0.4 mg by mouth at bedtime.   05/14/2023   UNABLE TO FIND Take 1 tablet by mouth daily. Bone supplement   05/15/2023    Scheduled:   ALPRAZolam  0.5 mg Oral QHS   amLODipine  5 mg Oral Daily   aspirin EC  81 mg Oral Daily   atorvastatin  80 mg Oral Daily   isosorbide mononitrate  15 mg Oral Daily   mirtazapine  7.5 mg Oral QHS   sertraline  150 mg Oral Daily  sodium chloride flush  3 mL Intravenous Q12H   sodium chloride flush  3 mL Intravenous Q12H   tamsulosin  0.4 mg Oral QHS   ticagrelor  90 mg Oral BID   Infusions:   sodium chloride     heparin 1,150 Units/hr (05/17/23 0529)   PRN: sodium chloride, acetaminophen, nitroGLYCERIN, ondansetron (ZOFRAN) IV, sodium chloride flush  Assessment: 88 yom with a history of HLD, unstable angina, HTN, TIA, CKD, CAD, CVA, AAA. Patient is presenting with chest pain. Heparin per pharmacy consult placed for chest pain/ACS.  Patient is not on anticoagulation prior to arrival.  Heparin level was therapeutic at 0.3, on 1150 units/hr. Hgb 12.5, plt 157. No s/sx of bleeding or infusion issues.   Goal of Therapy:  Heparin level 0.3-0.7 units/ml Monitor platelets by anticoagulation protocol: Yes   Plan:  Continue heparin infusion at 1150 units/hr for 48 hours total - stop date entered for 5/29@1800   Thank you for allowing pharmacy to  participate in this patient's care,  Sherron Monday, PharmD, BCCCP Clinical Pharmacist  Phone: [phone] 05/17/2023 8:53 AM  Please check AMION for all [facility] phone numbers After 10:00 PM, call Main Pharmacy [phone]

## 2023-05-17 NOTE — Progress Notes (Signed)
[address]       Gregory [name] [id number]             [phone]       Was asked to see Gregory [name] regarding his two-vessel coronary disease.  I know him from my working up his right upper lobe lung nodule.  Was originally scheduled for surgery for the lung nodule, but canceled due to anginal pain.  Then admitted with unstable angina over the weekend.  Catheterization showed two-vessel coronary disease with a complex lesion in the LAD with post stent aneurysmal dilatation and a tortuous circumflex with an 80% stenosis past the takeoff of a large marginal branch.   Subjective: No complaints at present  Objective: Vital signs in last 24 hours: Temp:  [97.6 F (36.4 C)-98.6 F (37 C)] 98.5 F (36.9 C) (05/29 1640) Pulse Rate:  [60-66] 62 (05/29 0838) Cardiac Rhythm: Sinus bradycardia (05/29 0700) Resp:  [14-18] 14 (05/29 1640) BP: (100-148)/(68-80) 100/68 (05/29 1640) SpO2:  [93 %-96 %] 93 % (05/29 0838)  Hemodynamic parameters for last 24 hours:    Intake/Output from previous day: 05/28 0701 - 05/29 0700 In: 400.3 [I.V.:400.3] Out: -  Intake/Output this shift: Total I/O In: 127.4 [I.V.:127.4] Out: -   General appearance: alert, cooperative, and no distress Neurologic: intact Heart: regular rate and rhythm Lungs: clear to auscultation bilaterally  Lab Results: Recent Labs    05/16/23 0327 05/17/23 0205  WBC 7.1 7.7  HGB 12.6* 12.5*  HCT 37.9* 37.5*  PLT 148* 157   BMET:  Recent Labs    05/16/23 0327 05/17/23 0205  NA 137 137  K 3.9 3.8  CL 105 104  CO2 24 24  GLUCOSE 125* 115*  BUN 13 14  CREATININE 1.13 1.30*  CALCIUM 8.7* 8.9    PT/INR: No results for input(s): "LABPROT", "INR" in the last 72 hours. ABG No results found for: "PHART", "HCO3", "TCO2", "ACIDBASEDEF", "O2SAT" CBG (last 3)  No results for input(s): "GLUCAP" in the last 72 hours.  Cardiac catheterization   Prox LAD to Mid LAD lesion is 50% stenosed.   Dist Cx  lesion is 50% stenosed.   Ost LAD to Prox LAD lesion is 50% stenosed.   Dist LM lesion is 40% stenosed.   Prox LAD lesion is 80% stenosed.   Mid Cx lesion is 80% stenosed.  Right to left collaterals noted to the distal circumflex system   The left ventricular systolic function is normal.   LV end diastolic pressure is normal.   The left ventricular ejection fraction is 55-65% by visual estimate.   There is no aortic valve stenosis.   JL4 needed for left main engagement.   Heavily calcified proximal to mid LAD.  Plaque extends from the distal left main into the ostial and proximal LAD.  Focal, 80% mid LAD lesion just prior to an ectatic segment in the mid LAD.  Treating this would require calcium modification.   Compared to January, the slow flow in the circumflex with right to left collaterals to the distal circumflex is a new finding.  Difficult to tell whether the hypodense lesion in the mid circumflex is thrombus or calcium.  Discussed with Dr. Herbie Baltimore.  Will restart IV heparin.  Stop Plavix.  Start Brilinta.  There is severe tortuosity in the proximal circumflex which would make any type of intervention in the mid circumflex difficult.  Would favor medical therapy at this point.  I personally reviewed the  cardiac catheterization images.  There is a complicated proximal to mid LAD stenosis with a poststenotic dilatation aneurysm and 80% stenosis in the distal circumflex beyond the takeoff of a large obtuse marginal.  There are some right to left collaterals.  Has good targets for bypass grafting.  Preserved left ventricular systolic function.    Assessment/Plan: S/P Procedure(s) (LRB): LEFT HEART CATH AND CORONARY ANGIOGRAPHY (N/A) Gregory [name] is an 89 year old man with a history of remote tobacco use, hypertension, hyperlipidemia, right MCA stroke, non-ST elevation MI, CAD, stage IIIa chronic kidney disease, carotid disease, carotid stenting, and a right upper lobe lung  nodule.  Complicated situation as he was being evaluated for possible lung resection and then presented with unstable anginal symptoms.  At catheterization has two-vessel disease not amenable to percutaneous intervention.  Initial consideration was given to medical therapy.  I discussed the situation with Gregory [name] and his family.  He is a candidate for coronary bypass grafting albeit at high risk due to his advanced age and comorbidities.  I would not consider combined lung resection given his age but may be able to biopsy of the right upper lobe nodule to get definitive diagnosis.  Coronary bypass grafting is indicated for survival benefit and relief of symptoms.  I discussed the proposed operative procedure with Gregory [name] and his family.  They understand the need for general anesthesia, incisions to be used, the possible need for cardiopulmonary bypass, use of drainage tubes and temporary pacemaker wires postoperatively, the expected hospital stay, and the overall recovery.  I informed them of the indications, risk, benefits, and alternatives.  They understand high risk due to advanced age, previous stroke, chronic kidney disease, and possible lung cancer.  They understand the risks include, but not limited to death, MI, stroke, DVT, PE, bleeding, possible need for transfusion, infection, cardiac arrhythmias, respiratory or renal failure, as well as possibility of other unforeseeable complications.  If he decides to proceed with coronary bypass surgery we could do it on Monday, 05/22/2023.  Would need to stop Brilinta.  LOS: 2 days    Gregory [name] 05/17/2023

## 2023-05-17 NOTE — Progress Notes (Addendum)
Per Dr. Dorris Fetch plan for outpatient CABG on Monday 05/22/2023.  Will stop Brilinta in anticipation for this procedure. See Dr. Erich Montane addendum from previous note. Plan to DC tomorrow.

## 2023-05-17 NOTE — TOC Initial Note (Signed)
Transition of Care [facility]) - Initial/Assessment Note    Patient Details  Name: Gregory [name] MRN: [id number] Date of Birth: [birth year]  Transition of Care [facility]) CM/SW Contact:    Gala Lewandowsky, RN Phone Number: 05/17/2023, 3:30 PM  Clinical Narrative: Patient presented for chest pain. PTA patient was independent from home with spouse and son. Patient has DME RW; however, he does not use the equipment. Per son, patient was working on the tractor when he started having chest pain. Case Manager will continue to follow for additional transition of care needs as the patient progresses.                  Expected Discharge Plan: Home/Self Care Barriers to Discharge: Continued Medical Work up   Patient Goals and CMS Choice Patient states their goals for this hospitalization and ongoing recovery are:: to return home.   Choice offered to / list presented to : NA      Expected Discharge Plan and Services In-house Referral: NA Discharge Planning Services: CM Consult Post Acute Care Choice: NA Living arrangements for the past 2 months: Single Family Home                   DME Agency: NA       HH Arranged: NA   Prior Living Arrangements/Services Living arrangements for the past 2 months: Single Family Home Lives with:: Spouse, Adult Children Patient language and need for interpreter reviewed:: Yes Do you feel safe going back to the place where you live?: Yes      Need for Family Participation in Patient Care: Yes (Comment) Care giver support system in place?: Yes (comment) Current home services: DME (rolling walker) Criminal Activity/Legal Involvement Pertinent to Current Situation/Hospitalization: No - Comment as needed  Activities of Daily Living Home Assistive Devices/Equipment: Eyeglasses, Hearing aid ADL Screening (condition at time of admission) Patient's cognitive ability adequate to safely complete daily activities?: Yes Is the patient deaf or have difficulty  hearing?: Yes Does the patient have difficulty seeing, even when wearing glasses/contacts?: No Does the patient have difficulty concentrating, remembering, or making decisions?: No Patient able to express need for assistance with ADLs?: Yes Does the patient have difficulty dressing or bathing?: No Independently performs ADLs?: Yes (appropriate for developmental age) Does the patient have difficulty walking or climbing stairs?: No Weakness of Legs: None Weakness of Arms/Hands: None  Permission Sought/Granted Permission sought to share information with : Case Manager, Family Supports   Emotional Assessment Appearance:: Appears stated age Attitude/Demeanor/Rapport: Engaged Affect (typically observed): Appropriate Orientation: : Oriented to Situation, Oriented to  Time, Oriented to Self, Oriented to Place Alcohol / Substance Use: Not Applicable Psych Involvement: No (comment)  Admission diagnosis:  Unstable angina (HCC) [I20.0] NSTEMI (non-ST elevated myocardial infarction) [facility]) [I21.4] Patient Active Problem List   Diagnosis Date Noted   Dyslipidemia 02/28/2023   Stage 3a chronic kidney disease (CKD) (HCC) 12/30/2022   Hyperglycemia 12/30/2022   Lung nodule 12/30/2022   CAD (coronary artery disease) 12/30/2022   AAA (abdominal aortic aneurysm) without rupture (HCC) 12/30/2022   Carotid artery disease (HCC) 12/30/2022   Non-ST elevation (NSTEMI) myocardial infarction (HCC) 12/29/2022   AKI (acute kidney injury) (HCC) 12/25/2022   Primary hypertension 12/25/2022   History of CVA (cerebrovascular accident) 12/25/2022   Angina pectoris (HCC) 12/24/2022   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 01/02/2018   Tremor 06/14/2017   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/17/2013   PCP:  Barbie Banner, MD Pharmacy:  [facility] [phone] - [place], [place] - [address] [place] [address] Phone: [phone] Fax: [phone]  [facility]  [id number] - [place],  - [address] AT [facility] [address] [place] [address] Phone: [phone] Fax: [phone]  Social Determinants of Health (SDOH) Social History: SDOH Screenings   Food Insecurity: No Food Insecurity (05/15/2023)  Housing: Low Risk  (05/15/2023)  Transportation Needs: No Transportation Needs (05/15/2023)  Utilities: Not At Risk (05/15/2023)  Tobacco Use: Medium Risk (05/17/2023)   Readmission Risk Interventions     No data to display

## 2023-05-18 ENCOUNTER — Other Ambulatory Visit: Payer: Self-pay | Admitting: *Deleted

## 2023-05-18 ENCOUNTER — Inpatient Hospital Stay (HOSPITAL_COMMUNITY): Payer: PPO

## 2023-05-18 ENCOUNTER — Other Ambulatory Visit (HOSPITAL_COMMUNITY): Payer: Self-pay

## 2023-05-18 DIAGNOSIS — N179 Acute kidney failure, unspecified: Secondary | ICD-10-CM | POA: Diagnosis not present

## 2023-05-18 DIAGNOSIS — Z8673 Personal history of transient ischemic attack (TIA), and cerebral infarction without residual deficits: Secondary | ICD-10-CM

## 2023-05-18 DIAGNOSIS — Z01810 Encounter for preprocedural cardiovascular examination: Secondary | ICD-10-CM | POA: Diagnosis not present

## 2023-05-18 DIAGNOSIS — I2510 Atherosclerotic heart disease of native coronary artery without angina pectoris: Secondary | ICD-10-CM

## 2023-05-18 DIAGNOSIS — I779 Disorder of arteries and arterioles, unspecified: Secondary | ICD-10-CM

## 2023-05-18 DIAGNOSIS — I25110 Atherosclerotic heart disease of native coronary artery with unstable angina pectoris: Secondary | ICD-10-CM | POA: Diagnosis not present

## 2023-05-18 DIAGNOSIS — N1831 Chronic kidney disease, stage 3a: Secondary | ICD-10-CM | POA: Diagnosis not present

## 2023-05-18 DIAGNOSIS — I214 Non-ST elevation (NSTEMI) myocardial infarction: Secondary | ICD-10-CM | POA: Diagnosis not present

## 2023-05-18 DIAGNOSIS — R911 Solitary pulmonary nodule: Secondary | ICD-10-CM

## 2023-05-18 LAB — BLOOD GAS, ARTERIAL
Acid-Base Excess: 0.7 mmol/L (ref 0.0–2.0)
Bicarbonate: 25.4 mmol/L (ref 20.0–28.0)
Drawn by: 66631
O2 Saturation: 95 %
Patient temperature: 36.5
pCO2 arterial: 39 mmHg (ref 32–48)
pH, Arterial: 7.42 (ref 7.35–7.45)
pO2, Arterial: 75 mmHg — ABNORMAL LOW (ref 83–108)

## 2023-05-18 LAB — ABO/RH: ABO/RH(D): O POS

## 2023-05-18 LAB — PROTIME-INR
INR: 1.1 (ref 0.8–1.2)
Prothrombin Time: 14.7 seconds (ref 11.4–15.2)

## 2023-05-18 LAB — TYPE AND SCREEN
ABO/RH(D): O POS
Antibody Screen: NEGATIVE

## 2023-05-18 LAB — BASIC METABOLIC PANEL
Anion gap: 10 (ref 5–15)
BUN: 19 mg/dL (ref 8–23)
CO2: 22 mmol/L (ref 22–32)
Calcium: 8.9 mg/dL (ref 8.9–10.3)
Chloride: 103 mmol/L (ref 98–111)
Creatinine, Ser: 1.54 mg/dL — ABNORMAL HIGH (ref 0.61–1.24)
GFR, Estimated: 44 mL/min — ABNORMAL LOW (ref >60)
Glucose, Bld: 128 mg/dL — ABNORMAL HIGH (ref 70–99)
Potassium: 3.7 mmol/L (ref 3.5–5.1)
Sodium: 135 mmol/L (ref 135–145)

## 2023-05-18 LAB — APTT: aPTT: 33 seconds (ref 24–36)

## 2023-05-18 LAB — HEMOGLOBIN A1C
Hgb A1c MFr Bld: 6.9 % — ABNORMAL HIGH (ref 4.8–5.6)
Mean Plasma Glucose: 151 mg/dL

## 2023-05-18 MED ORDER — RANOLAZINE ER 500 MG PO TB12
500.0000 mg | ORAL_TABLET | Freq: Two times a day (BID) | ORAL | 6 refills | Status: DC
  Filled 2023-05-18: qty 60, 30d supply, fill #0

## 2023-05-18 MED ORDER — ISOSORBIDE MONONITRATE ER 30 MG PO TB24
30.0000 mg | ORAL_TABLET | Freq: Every day | ORAL | 5 refills | Status: DC
  Filled 2023-05-18: qty 30, 30d supply, fill #0

## 2023-05-18 NOTE — Progress Notes (Signed)
VASCULAR LAB    Pre CABG Dopplers have been performed.  See CV proc for preliminary results.   Emilliano Dilworth, RVT 05/18/2023, 10:04 AM

## 2023-05-18 NOTE — Progress Notes (Signed)
Discharge instructions reviewed with pt and his son, Kathlene November.  Copy of instructions given to pt. [facility] filled script for pt and medication was delivered to pt in his room. Pt and son verbalized understanding of instructions and able to teach back.  Pt to return next week for OHS, June 3, pt aware and understands not to take his plavix and fish oil at home until after he has had surgery and instructed to do so by the surgeon.  Pt d/c'd via wheelchair with belongings, with son.            Escorted by hospital volunteer.   Annice Needy, RN SWOT

## 2023-05-18 NOTE — Progress Notes (Signed)
CARDIAC REHAB PHASE I   Pre-op OHS education including IS use,OHS handout, OHS booklet, home needs at discharge, mobility importance and sternal precautions. Pt reports he will be discharged home and return for surgery next week.    [id number]  Woodroe Chen, RN BSN 05/18/2023 10:56 AM

## 2023-05-19 ENCOUNTER — Encounter (HOSPITAL_COMMUNITY): Payer: Self-pay | Admitting: Thoracic Surgery (Cardiothoracic Vascular Surgery)

## 2023-05-19 ENCOUNTER — Other Ambulatory Visit: Payer: Self-pay

## 2023-05-19 MED ORDER — NOREPINEPHRINE 4 MG/250ML-% IV SOLN
0.0000 ug/min | INTRAVENOUS | Status: DC
  Filled 2023-05-19: qty 250

## 2023-05-19 MED ORDER — DEXMEDETOMIDINE HCL IN NACL 400 MCG/100ML IV SOLN
0.1000 ug/kg/h | INTRAVENOUS | Status: DC
  Filled 2023-05-19: qty 100

## 2023-05-19 MED ORDER — HEPARIN 30,000 UNITS/1000 ML (OHS) CELLSAVER SOLUTION
Status: DC
  Filled 2023-05-19: qty 1000

## 2023-05-19 MED ORDER — EPINEPHRINE HCL 5 MG/250ML IV SOLN IN NS
0.0000 ug/min | INTRAVENOUS | Status: DC
  Filled 2023-05-19: qty 250

## 2023-05-19 MED ORDER — MILRINONE LACTATE IN DEXTROSE 20-5 MG/100ML-% IV SOLN
0.3000 ug/kg/min | INTRAVENOUS | Status: DC
  Filled 2023-05-19: qty 100

## 2023-05-19 MED ORDER — PHENYLEPHRINE HCL-NACL 20-0.9 MG/250ML-% IV SOLN
30.0000 ug/min | INTRAVENOUS | Status: DC
  Filled 2023-05-19: qty 250

## 2023-05-19 MED ORDER — POTASSIUM CHLORIDE 2 MEQ/ML IV SOLN
80.0000 meq | INTRAVENOUS | Status: DC
  Filled 2023-05-19: qty 40

## 2023-05-19 MED ORDER — MAGNESIUM SULFATE 50 % IJ SOLN
40.0000 meq | INTRAMUSCULAR | Status: DC
  Filled 2023-05-19: qty 9.85

## 2023-05-19 MED ORDER — TRANEXAMIC ACID (OHS) PUMP PRIME SOLUTION
2.0000 mg/kg | INTRAVENOUS | Status: DC
  Filled 2023-05-19: qty 1.7

## 2023-05-19 MED ORDER — TRANEXAMIC ACID (OHS) BOLUS VIA INFUSION
15.0000 mg/kg | INTRAVENOUS | Status: DC
  Filled 2023-05-19: qty 1274

## 2023-05-19 MED ORDER — CEFAZOLIN SODIUM-DEXTROSE 2-4 GM/100ML-% IV SOLN
2.0000 g | INTRAVENOUS | Status: DC
  Filled 2023-05-19: qty 100

## 2023-05-19 MED ORDER — TRANEXAMIC ACID 1000 MG/10ML IV SOLN
1.5000 mg/kg/h | INTRAVENOUS | Status: DC
  Filled 2023-05-19: qty 25

## 2023-05-19 MED ORDER — INSULIN REGULAR(HUMAN) IN NACL 100-0.9 UT/100ML-% IV SOLN
INTRAVENOUS | Status: DC
  Filled 2023-05-19: qty 100

## 2023-05-19 MED ORDER — PLASMA-LYTE A IV SOLN
INTRAVENOUS | Status: DC
  Filled 2023-05-19: qty 2.5

## 2023-05-19 MED ORDER — NITROGLYCERIN IN D5W 200-5 MCG/ML-% IV SOLN
2.0000 ug/min | INTRAVENOUS | Status: DC
  Filled 2023-05-19: qty 250

## 2023-05-19 MED ORDER — VANCOMYCIN HCL 1500 MG/300ML IV SOLN
1500.0000 mg | INTRAVENOUS | Status: DC
  Filled 2023-05-19: qty 300

## 2023-05-19 NOTE — Progress Notes (Signed)
Spoke with pt's son, Arhum Cayabyab for pre-op call. Pt just released from hospital yesterday after coming to the ED on 05/15/23 for chest pain intermittently for several days. Pt is coming back on Monday for a CABG. Casimiro Needle states pt is doing well at home. Pt has hx of 2 TIA's in the past with no lasting weakness or paralysis. Pt is not diabetic.   Shower instructions given to [employer].

## 2023-05-21 MED ORDER — CEFAZOLIN SODIUM-DEXTROSE 2-4 GM/100ML-% IV SOLN
2.0000 g | INTRAVENOUS | Status: AC
  Administered 2023-05-22: 2 g via INTRAVENOUS
  Filled 2023-05-21: qty 100

## 2023-05-21 MED ORDER — PHENYLEPHRINE HCL-NACL 20-0.9 MG/250ML-% IV SOLN
30.0000 ug/min | INTRAVENOUS | Status: AC
  Administered 2023-05-22: 25 ug/min via INTRAVENOUS
  Filled 2023-05-21 (×2): qty 250

## 2023-05-21 MED ORDER — EPINEPHRINE HCL 5 MG/250ML IV SOLN IN NS
0.0000 ug/min | INTRAVENOUS | Status: AC
  Administered 2023-05-22: 2 ug/min via INTRAVENOUS
  Filled 2023-05-21 (×2): qty 250

## 2023-05-21 MED ORDER — VANCOMYCIN HCL 1500 MG/300ML IV SOLN
1500.0000 mg | INTRAVENOUS | Status: AC
  Administered 2023-05-22: 1500 mg via INTRAVENOUS
  Filled 2023-05-21 (×2): qty 300

## 2023-05-21 MED ORDER — INSULIN REGULAR(HUMAN) IN NACL 100-0.9 UT/100ML-% IV SOLN
INTRAVENOUS | Status: AC
  Administered 2023-05-22: .9 [IU]/h via INTRAVENOUS
  Filled 2023-05-21: qty 100

## 2023-05-21 MED ORDER — MAGNESIUM SULFATE 50 % IJ SOLN
40.0000 meq | INTRAMUSCULAR | Status: DC
  Filled 2023-05-21: qty 9.85

## 2023-05-21 MED ORDER — NOREPINEPHRINE 4 MG/250ML-% IV SOLN
0.0000 ug/min | INTRAVENOUS | Status: AC
  Administered 2023-05-22: 1 ug/min via INTRAVENOUS
  Filled 2023-05-21 (×2): qty 250

## 2023-05-21 MED ORDER — NITROGLYCERIN IN D5W 200-5 MCG/ML-% IV SOLN
2.0000 ug/min | INTRAVENOUS | Status: DC
  Filled 2023-05-21 (×2): qty 250

## 2023-05-21 MED ORDER — HEPARIN 30,000 UNITS/1000 ML (OHS) CELLSAVER SOLUTION
Status: DC
  Filled 2023-05-21 (×2): qty 1000

## 2023-05-21 MED ORDER — TRANEXAMIC ACID (OHS) PUMP PRIME SOLUTION
2.0000 mg/kg | INTRAVENOUS | Status: DC
  Filled 2023-05-21: qty 1.7

## 2023-05-21 MED ORDER — MILRINONE LACTATE IN DEXTROSE 20-5 MG/100ML-% IV SOLN
0.3000 ug/kg/min | INTRAVENOUS | Status: DC
  Filled 2023-05-21 (×2): qty 100

## 2023-05-21 MED ORDER — PLASMA-LYTE A IV SOLN
INTRAVENOUS | Status: DC
  Filled 2023-05-21: qty 2.5

## 2023-05-21 MED ORDER — DEXMEDETOMIDINE HCL IN NACL 400 MCG/100ML IV SOLN
0.1000 ug/kg/h | INTRAVENOUS | Status: AC
  Administered 2023-05-22: .7 ug/kg/h via INTRAVENOUS
  Filled 2023-05-21 (×2): qty 100

## 2023-05-21 MED ORDER — TRANEXAMIC ACID (OHS) BOLUS VIA INFUSION
15.0000 mg/kg | INTRAVENOUS | Status: AC
  Administered 2023-05-22: 1273.5 mg via INTRAVENOUS
  Filled 2023-05-21: qty 1274

## 2023-05-21 MED ORDER — TRANEXAMIC ACID 1000 MG/10ML IV SOLN
1.5000 mg/kg/h | INTRAVENOUS | Status: AC
  Administered 2023-05-22: 1.5 mg/kg/h via INTRAVENOUS
  Filled 2023-05-21: qty 25

## 2023-05-21 MED ORDER — POTASSIUM CHLORIDE 2 MEQ/ML IV SOLN
80.0000 meq | INTRAVENOUS | Status: DC
  Filled 2023-05-21: qty 40

## 2023-05-21 NOTE — Anesthesia Preprocedure Evaluation (Signed)
Anesthesia Evaluation  Patient identified by MRN, date of birth, ID band Patient awake    Reviewed: Allergy & Precautions, H&P , NPO status , Patient's Chart, lab work & pertinent test results, reviewed documented beta blocker date and time   Airway Mallampati: II  TM Distance: >3 FB Neck ROM: Full    Dental  (+) Dental Advisory Given, Caps   Pulmonary former smoker   Pulmonary exam normal breath sounds clear to auscultation       Cardiovascular hypertension, Pt. on medications and Pt. on home beta blockers + CAD and + Past MI   Rhythm:Regular Rate:Normal + Systolic murmurs Echo 04/2023  1. Left ventricular ejection fraction, by estimation, is 55 to 60%. The left ventricle has normal function. The left ventricle has no regional wall motion abnormalities. There is mild asymmetric left ventricular hypertrophy of the septal segment. Left ventricular diastolic parameters are consistent with Grade I diastolic dysfunction (impaired relaxation).   2. Right ventricular systolic function is normal. The right ventricular size is normal. Tricuspid regurgitation signal is inadequate for assessing PA pressure.   3. The mitral valve is grossly normal. Trivial mitral valve regurgitation. No evidence of mitral stenosis.   4. The aortic valve is calcified. There is mild calcification of the aortic valve. There is mild thickening of the aortic valve. Aortic valve regurgitation is trivial. Aortic valve sclerosis/calcification is present, without any evidence of aortic stenosis.   5. Aortic dilatation noted. There is mild dilatation of the aortic root, measuring 40 mm.   6. The inferior vena cava is normal in size with greater than 50% respiratory variability, suggesting right atrial pressure of 3 mmHg.   Comparison(s): No significant change from prior study.   LHC 05/16/2023   Prox LAD to Mid LAD lesion is 50% stenosed.   Dist Cx lesion is 50%  stenosed.   Ost LAD to Prox LAD lesion is 50% stenosed.   Dist LM lesion is 40% stenosed.   Prox LAD lesion is 80% stenosed.   Mid Cx lesion is 80% stenosed.  Right to left collaterals noted to the distal circumflex system   The left ventricular systolic function is normal.   LV end diastolic pressure is normal.   The left ventricular ejection fraction is 55-65% by visual estimate.   There is no aortic valve stenosis.   JL4 needed for left main engagement.   Heavily calcified proximal to mid LAD.  Plaque extends from the distal left main into the ostial and proximal LAD.  Focal, 80% mid LAD lesion just prior to an ectatic segment in the mid LAD.  Treating this would require calcium modification.    Neuro/Psych  PSYCHIATRIC DISORDERS  Depression    CVA, No Residual Symptoms    GI/Hepatic negative GI ROS, Neg liver ROS,,,  Endo/Other  negative endocrine ROS    Renal/GU Renal disease  negative genitourinary   Musculoskeletal   Abdominal   Peds  Hematology negative hematology ROS (+)   Anesthesia Other Findings   Reproductive/Obstetrics negative OB ROS                             Anesthesia Physical Anesthesia Plan  ASA: 4  Anesthesia Plan: General   Post-op Pain Management:    Induction: Intravenous  PONV Risk Score and Plan: 3 and Ondansetron, Treatment may vary due to age or medical condition and Midazolam  Airway Management Planned: Oral ETT  Additional Equipment: Arterial  line, CVP, PA Cath, TEE and Ultrasound Guidance Line Placement  Intra-op Plan:   Post-operative Plan: Post-operative intubation/ventilation  Informed Consent: I have reviewed the patients History and Physical, chart, labs and discussed the procedure including the risks, benefits and alternatives for the proposed anesthesia with the patient or authorized representative who has indicated his/her understanding and acceptance.     Dental advisory  given  Plan Discussed with: CRNA  Anesthesia Plan Comments:        Anesthesia Quick Evaluation

## 2023-05-22 ENCOUNTER — Other Ambulatory Visit: Payer: Self-pay

## 2023-05-22 ENCOUNTER — Inpatient Hospital Stay (HOSPITAL_COMMUNITY)
Admission: RE | Disposition: A | Discharge status: Home Health | Payer: Self-pay | Source: Home / Self Care | Attending: Thoracic Surgery (Cardiothoracic Vascular Surgery)

## 2023-05-22 ENCOUNTER — Inpatient Hospital Stay (HOSPITAL_COMMUNITY): Payer: PPO

## 2023-05-22 ENCOUNTER — Inpatient Hospital Stay (HOSPITAL_COMMUNITY): Payer: PPO | Admitting: Certified Registered Nurse Anesthetist

## 2023-05-22 ENCOUNTER — Inpatient Hospital Stay (HOSPITAL_COMMUNITY)
Admission: RE | Admit: 2023-05-22 | Discharge: 2023-05-28 | DRG: 236 | Disposition: A | Discharge status: Home Health | Payer: PPO | Attending: Thoracic Surgery (Cardiothoracic Vascular Surgery) | Admitting: Thoracic Surgery (Cardiothoracic Vascular Surgery)

## 2023-05-22 ENCOUNTER — Encounter (HOSPITAL_COMMUNITY): Payer: Self-pay | Admitting: Thoracic Surgery (Cardiothoracic Vascular Surgery)

## 2023-05-22 DIAGNOSIS — I252 Old myocardial infarction: Secondary | ICD-10-CM | POA: Diagnosis not present

## 2023-05-22 DIAGNOSIS — Z1152 Encounter for screening for COVID-19: Secondary | ICD-10-CM

## 2023-05-22 DIAGNOSIS — D696 Thrombocytopenia, unspecified: Secondary | ICD-10-CM | POA: Diagnosis not present

## 2023-05-22 DIAGNOSIS — J9811 Atelectasis: Secondary | ICD-10-CM | POA: Diagnosis not present

## 2023-05-22 DIAGNOSIS — Z8673 Personal history of transient ischemic attack (TIA), and cerebral infarction without residual deficits: Secondary | ICD-10-CM

## 2023-05-22 DIAGNOSIS — I2510 Atherosclerotic heart disease of native coronary artery without angina pectoris: Secondary | ICD-10-CM

## 2023-05-22 DIAGNOSIS — E8770 Fluid overload, unspecified: Secondary | ICD-10-CM | POA: Diagnosis not present

## 2023-05-22 DIAGNOSIS — I350 Nonrheumatic aortic (valve) stenosis: Secondary | ICD-10-CM | POA: Diagnosis present

## 2023-05-22 DIAGNOSIS — E785 Hyperlipidemia, unspecified: Secondary | ICD-10-CM | POA: Diagnosis present

## 2023-05-22 DIAGNOSIS — I119 Hypertensive heart disease without heart failure: Secondary | ICD-10-CM | POA: Diagnosis not present

## 2023-05-22 DIAGNOSIS — D62 Acute posthemorrhagic anemia: Secondary | ICD-10-CM | POA: Diagnosis not present

## 2023-05-22 DIAGNOSIS — J9383 Other pneumothorax: Secondary | ICD-10-CM | POA: Diagnosis not present

## 2023-05-22 DIAGNOSIS — Z79899 Other long term (current) drug therapy: Secondary | ICD-10-CM

## 2023-05-22 DIAGNOSIS — E1122 Type 2 diabetes mellitus with diabetic chronic kidney disease: Secondary | ICD-10-CM | POA: Diagnosis present

## 2023-05-22 DIAGNOSIS — Z87891 Personal history of nicotine dependence: Secondary | ICD-10-CM

## 2023-05-22 DIAGNOSIS — I129 Hypertensive chronic kidney disease with stage 1 through stage 4 chronic kidney disease, or unspecified chronic kidney disease: Secondary | ICD-10-CM | POA: Diagnosis present

## 2023-05-22 DIAGNOSIS — I480 Paroxysmal atrial fibrillation: Secondary | ICD-10-CM | POA: Diagnosis present

## 2023-05-22 DIAGNOSIS — I25110 Atherosclerotic heart disease of native coronary artery with unstable angina pectoris: Principal | ICD-10-CM | POA: Diagnosis present

## 2023-05-22 DIAGNOSIS — R911 Solitary pulmonary nodule: Secondary | ICD-10-CM | POA: Diagnosis not present

## 2023-05-22 DIAGNOSIS — C3411 Malignant neoplasm of upper lobe, right bronchus or lung: Secondary | ICD-10-CM | POA: Diagnosis present

## 2023-05-22 DIAGNOSIS — Z951 Presence of aortocoronary bypass graft: Principal | ICD-10-CM

## 2023-05-22 DIAGNOSIS — N1832 Chronic kidney disease, stage 3b: Secondary | ICD-10-CM | POA: Diagnosis present

## 2023-05-22 HISTORY — PX: CORONARY ARTERY BYPASS GRAFT: SHX141

## 2023-05-22 HISTORY — DX: Atherosclerotic heart disease of native coronary artery without angina pectoris: I25.10

## 2023-05-22 HISTORY — PX: TEE WITHOUT CARDIOVERSION: SHX5443

## 2023-05-22 LAB — POCT I-STAT 7, (LYTES, BLD GAS, ICA,H+H)
Acid-Base Excess: 0 mmol/L (ref 0.0–2.0)
Acid-base deficit: 2 mmol/L (ref 0.0–2.0)
Acid-base deficit: 1 mmol/L (ref 0.0–2.0)
Acid-base deficit: 4 mmol/L — ABNORMAL HIGH (ref 0.0–2.0)
Acid-base deficit: 6 mmol/L — ABNORMAL HIGH (ref 0.0–2.0)
Acid-base deficit: 5 mmol/L — ABNORMAL HIGH (ref 0.0–2.0)
Acid-base deficit: 9 mmol/L — ABNORMAL HIGH (ref 0.0–2.0)
Bicarbonate: 23.8 mmol/L (ref 20.0–28.0)
Bicarbonate: 25.3 mmol/L (ref 20.0–28.0)
Bicarbonate: 24.2 mmol/L (ref 20.0–28.0)
Bicarbonate: 22.2 mmol/L (ref 20.0–28.0)
Bicarbonate: 22.2 mmol/L (ref 20.0–28.0)
Bicarbonate: 23.2 mmol/L (ref 20.0–28.0)
Bicarbonate: 19.5 mmol/L — ABNORMAL LOW (ref 20.0–28.0)
Calcium, Ion: 1.29 mmol/L (ref 1.15–1.40)
Calcium, Ion: 1.12 mmol/L — ABNORMAL LOW (ref 1.15–1.40)
Calcium, Ion: 1.11 mmol/L — ABNORMAL LOW (ref 1.15–1.40)
Calcium, Ion: 1.37 mmol/L (ref 1.15–1.40)
Calcium, Ion: 1.23 mmol/L (ref 1.15–1.40)
Calcium, Ion: 1.21 mmol/L (ref 1.15–1.40)
Calcium, Ion: 1.29 mmol/L (ref 1.15–1.40)
HCT: 35 % — ABNORMAL LOW (ref 39.0–52.0)
HCT: 24 % — ABNORMAL LOW (ref 39.0–52.0)
HCT: 23 % — ABNORMAL LOW (ref 39.0–52.0)
HCT: 20 % — ABNORMAL LOW (ref 39.0–52.0)
HCT: 31 % — ABNORMAL LOW (ref 39.0–52.0)
HCT: 35 % — ABNORMAL LOW (ref 39.0–52.0)
HCT: 33 % — ABNORMAL LOW (ref 39.0–52.0)
Hemoglobin: 11.9 g/dL — ABNORMAL LOW (ref 13.0–17.0)
Hemoglobin: 8.2 g/dL — ABNORMAL LOW (ref 13.0–17.0)
Hemoglobin: 7.8 g/dL — ABNORMAL LOW (ref 13.0–17.0)
Hemoglobin: 6.8 g/dL — CL (ref 13.0–17.0)
Hemoglobin: 10.5 g/dL — ABNORMAL LOW (ref 13.0–17.0)
Hemoglobin: 11.9 g/dL — ABNORMAL LOW (ref 13.0–17.0)
Hemoglobin: 11.2 g/dL — ABNORMAL LOW (ref 13.0–17.0)
O2 Saturation: 100 %
O2 Saturation: 100 %
O2 Saturation: 100 %
O2 Saturation: 93 %
O2 Saturation: 99 %
O2 Saturation: 93 %
O2 Saturation: 96 %
Patient temperature: 35.8
Patient temperature: 37.5
Potassium: 4.7 mmol/L (ref 3.5–5.1)
Potassium: 4.7 mmol/L (ref 3.5–5.1)
Potassium: 5.5 mmol/L — ABNORMAL HIGH (ref 3.5–5.1)
Potassium: 5 mmol/L (ref 3.5–5.1)
Potassium: 4.2 mmol/L (ref 3.5–5.1)
Potassium: 4.1 mmol/L (ref 3.5–5.1)
Potassium: 3.6 mmol/L (ref 3.5–5.1)
Sodium: 138 mmol/L (ref 135–145)
Sodium: 136 mmol/L (ref 135–145)
Sodium: 135 mmol/L (ref 135–145)
Sodium: 136 mmol/L (ref 135–145)
Sodium: 137 mmol/L (ref 135–145)
Sodium: 137 mmol/L (ref 135–145)
Sodium: 138 mmol/L (ref 135–145)
TCO2: 25 mmol/L (ref 22–32)
TCO2: 27 mmol/L (ref 22–32)
TCO2: 25 mmol/L (ref 22–32)
TCO2: 24 mmol/L (ref 22–32)
TCO2: 24 mmol/L (ref 22–32)
TCO2: 25 mmol/L (ref 22–32)
TCO2: 21 mmol/L — ABNORMAL LOW (ref 22–32)
pCO2 arterial: 42.1 mmHg (ref 32–48)
pCO2 arterial: 48 mmHg (ref 32–48)
pCO2 arterial: 38.5 mmHg (ref 32–48)
pCO2 arterial: 45.9 mmHg (ref 32–48)
pCO2 arterial: 54.3 mmHg — ABNORMAL HIGH (ref 32–48)
pCO2 arterial: 52.9 mmHg — ABNORMAL HIGH (ref 32–48)
pCO2 arterial: 52.7 mmHg — ABNORMAL HIGH (ref 32–48)
pH, Arterial: 7.36 (ref 7.35–7.45)
pH, Arterial: 7.33 — ABNORMAL LOW (ref 7.35–7.45)
pH, Arterial: 7.407 (ref 7.35–7.45)
pH, Arterial: 7.294 — ABNORMAL LOW (ref 7.35–7.45)
pH, Arterial: 7.219 — ABNORMAL LOW (ref 7.35–7.45)
pH, Arterial: 7.244 — ABNORMAL LOW (ref 7.35–7.45)
pH, Arterial: 7.179 — CL (ref 7.35–7.45)
pO2, Arterial: 450 mmHg — ABNORMAL HIGH (ref 83–108)
pO2, Arterial: 459 mmHg — ABNORMAL HIGH (ref 83–108)
pO2, Arterial: 262 mmHg — ABNORMAL HIGH (ref 83–108)
pO2, Arterial: 73 mmHg — ABNORMAL LOW (ref 83–108)
pO2, Arterial: 153 mmHg — ABNORMAL HIGH (ref 83–108)
pO2, Arterial: 75 mmHg — ABNORMAL LOW (ref 83–108)
pO2, Arterial: 107 mmHg (ref 83–108)

## 2023-05-22 LAB — POCT I-STAT, CHEM 8
BUN: 16 mg/dL (ref 8–23)
BUN: 15 mg/dL (ref 8–23)
BUN: 15 mg/dL (ref 8–23)
BUN: 14 mg/dL (ref 8–23)
BUN: 13 mg/dL (ref 8–23)
Calcium, Ion: 1.31 mmol/L (ref 1.15–1.40)
Calcium, Ion: 1.29 mmol/L (ref 1.15–1.40)
Calcium, Ion: 1.1 mmol/L — ABNORMAL LOW (ref 1.15–1.40)
Calcium, Ion: 1.35 mmol/L (ref 1.15–1.40)
Calcium, Ion: 1.22 mmol/L (ref 1.15–1.40)
Chloride: 102 mmol/L (ref 98–111)
Chloride: 104 mmol/L (ref 98–111)
Chloride: 103 mmol/L (ref 98–111)
Chloride: 102 mmol/L (ref 98–111)
Chloride: 102 mmol/L (ref 98–111)
Creatinine, Ser: 1.2 mg/dL (ref 0.61–1.24)
Creatinine, Ser: 1.1 mg/dL (ref 0.61–1.24)
Creatinine, Ser: 1.1 mg/dL (ref 0.61–1.24)
Creatinine, Ser: 1 mg/dL (ref 0.61–1.24)
Creatinine, Ser: 1 mg/dL (ref 0.61–1.24)
Glucose, Bld: 118 mg/dL — ABNORMAL HIGH (ref 70–99)
Glucose, Bld: 121 mg/dL — ABNORMAL HIGH (ref 70–99)
Glucose, Bld: 134 mg/dL — ABNORMAL HIGH (ref 70–99)
Glucose, Bld: 209 mg/dL — ABNORMAL HIGH (ref 70–99)
Glucose, Bld: 237 mg/dL — ABNORMAL HIGH (ref 70–99)
HCT: 33 % — ABNORMAL LOW (ref 39.0–52.0)
HCT: 33 % — ABNORMAL LOW (ref 39.0–52.0)
HCT: 21 % — ABNORMAL LOW (ref 39.0–52.0)
HCT: 22 % — ABNORMAL LOW (ref 39.0–52.0)
HCT: 31 % — ABNORMAL LOW (ref 39.0–52.0)
Hemoglobin: 11.2 g/dL — ABNORMAL LOW (ref 13.0–17.0)
Hemoglobin: 11.2 g/dL — ABNORMAL LOW (ref 13.0–17.0)
Hemoglobin: 7.1 g/dL — ABNORMAL LOW (ref 13.0–17.0)
Hemoglobin: 7.5 g/dL — ABNORMAL LOW (ref 13.0–17.0)
Hemoglobin: 10.5 g/dL — ABNORMAL LOW (ref 13.0–17.0)
Potassium: 4.7 mmol/L (ref 3.5–5.1)
Potassium: 4.7 mmol/L (ref 3.5–5.1)
Potassium: 5.9 mmol/L — ABNORMAL HIGH (ref 3.5–5.1)
Potassium: 4.9 mmol/L (ref 3.5–5.1)
Potassium: 4.2 mmol/L (ref 3.5–5.1)
Sodium: 138 mmol/L (ref 135–145)
Sodium: 138 mmol/L (ref 135–145)
Sodium: 135 mmol/L (ref 135–145)
Sodium: 135 mmol/L (ref 135–145)
Sodium: 137 mmol/L (ref 135–145)
TCO2: 28 mmol/L (ref 22–32)
TCO2: 29 mmol/L (ref 22–32)
TCO2: 26 mmol/L (ref 22–32)
TCO2: 23 mmol/L (ref 22–32)
TCO2: 26 mmol/L (ref 22–32)

## 2023-05-22 LAB — TYPE AND SCREEN: ABO/RH(D): O POS

## 2023-05-22 LAB — URINALYSIS, ROUTINE W REFLEX MICROSCOPIC
Bilirubin Urine: NEGATIVE
Glucose, UA: NEGATIVE mg/dL
Hgb urine dipstick: NEGATIVE
Ketones, ur: NEGATIVE mg/dL
Leukocytes,Ua: NEGATIVE
Nitrite: NEGATIVE
Protein, ur: NEGATIVE mg/dL
Specific Gravity, Urine: 1.01 (ref 1.005–1.030)
pH: 6 (ref 5.0–8.0)

## 2023-05-22 LAB — SURGICAL PCR SCREEN
MRSA, PCR: NEGATIVE
Staphylococcus aureus: NEGATIVE

## 2023-05-22 LAB — CBC
HCT: 36.7 % — ABNORMAL LOW (ref 39.0–52.0)
HCT: 36.5 % — ABNORMAL LOW (ref 39.0–52.0)
HCT: 34.5 % — ABNORMAL LOW (ref 39.0–52.0)
Hemoglobin: 12 g/dL — ABNORMAL LOW (ref 13.0–17.0)
Hemoglobin: 12 g/dL — ABNORMAL LOW (ref 13.0–17.0)
Hemoglobin: 11.4 g/dL — ABNORMAL LOW (ref 13.0–17.0)
MCH: 30.2 pg (ref 26.0–34.0)
MCH: 30.2 pg (ref 26.0–34.0)
MCH: 30.2 pg (ref 26.0–34.0)
MCHC: 32.7 g/dL (ref 30.0–36.0)
MCHC: 32.9 g/dL (ref 30.0–36.0)
MCHC: 33 g/dL (ref 30.0–36.0)
MCV: 92.2 fL (ref 80.0–100.0)
MCV: 91.9 fL (ref 80.0–100.0)
MCV: 91.5 fL (ref 80.0–100.0)
Platelets: 156 10*3/uL (ref 150–400)
Platelets: 114 10*3/uL — ABNORMAL LOW (ref 150–400)
Platelets: 118 10*3/uL — ABNORMAL LOW (ref 150–400)
RBC: 3.98 MIL/uL — ABNORMAL LOW (ref 4.22–5.81)
RBC: 3.97 MIL/uL — ABNORMAL LOW (ref 4.22–5.81)
RBC: 3.77 MIL/uL — ABNORMAL LOW (ref 4.22–5.81)
RDW: 12.9 % (ref 11.5–15.5)
RDW: 14 % (ref 11.5–15.5)
RDW: 14.2 % (ref 11.5–15.5)
WBC: 6.1 10*3/uL (ref 4.0–10.5)
WBC: 15.3 10*3/uL — ABNORMAL HIGH (ref 4.0–10.5)
WBC: 13.9 10*3/uL — ABNORMAL HIGH (ref 4.0–10.5)
nRBC: 0 % (ref 0.0–0.2)
nRBC: 0 % (ref 0.0–0.2)
nRBC: 0 % (ref 0.0–0.2)

## 2023-05-22 LAB — PROTIME-INR
INR: 1.5 — ABNORMAL HIGH (ref 0.8–1.2)
Prothrombin Time: 18.6 seconds — ABNORMAL HIGH (ref 11.4–15.2)

## 2023-05-22 LAB — HEMOGLOBIN AND HEMATOCRIT, BLOOD
HCT: 24.7 % — ABNORMAL LOW (ref 39.0–52.0)
Hemoglobin: 7.8 g/dL — ABNORMAL LOW (ref 13.0–17.0)

## 2023-05-22 LAB — SARS CORONAVIRUS 2 BY RT PCR: SARS Coronavirus 2 by RT PCR: NEGATIVE

## 2023-05-22 LAB — COMPREHENSIVE METABOLIC PANEL
ALT: 30 U/L (ref 0–44)
AST: 29 U/L (ref 15–41)
Albumin: 3.9 g/dL (ref 3.5–5.0)
Alkaline Phosphatase: 102 U/L (ref 38–126)
Anion gap: 8 (ref 5–15)
BUN: 16 mg/dL (ref 8–23)
CO2: 26 mmol/L (ref 22–32)
Calcium: 9.2 mg/dL (ref 8.9–10.3)
Chloride: 101 mmol/L (ref 98–111)
Creatinine, Ser: 1.39 mg/dL — ABNORMAL HIGH (ref 0.61–1.24)
GFR, Estimated: 49 mL/min — ABNORMAL LOW (ref >60)
Glucose, Bld: 109 mg/dL — ABNORMAL HIGH (ref 70–99)
Potassium: 4.4 mmol/L (ref 3.5–5.1)
Sodium: 135 mmol/L (ref 135–145)
Total Bilirubin: 1.2 mg/dL (ref 0.3–1.2)
Total Protein: 6.9 g/dL (ref 6.5–8.1)

## 2023-05-22 LAB — BPAM RBC
Blood Product Expiration Date: 202407012359
ISSUE DATE / TIME: 202406030801
ISSUE DATE / TIME: 202406031209

## 2023-05-22 LAB — BASIC METABOLIC PANEL
Anion gap: 11 (ref 5–15)
BUN: 12 mg/dL (ref 8–23)
CO2: 20 mmol/L — ABNORMAL LOW (ref 22–32)
Calcium: 8.8 mg/dL — ABNORMAL LOW (ref 8.9–10.3)
Chloride: 104 mmol/L (ref 98–111)
Creatinine, Ser: 1.45 mg/dL — ABNORMAL HIGH (ref 0.61–1.24)
GFR, Estimated: 47 mL/min — ABNORMAL LOW (ref >60)
Glucose, Bld: 239 mg/dL — ABNORMAL HIGH (ref 70–99)
Potassium: 3.4 mmol/L — ABNORMAL LOW (ref 3.5–5.1)
Sodium: 135 mmol/L (ref 135–145)

## 2023-05-22 LAB — POCT I-STAT EG7
Acid-Base Excess: 0 mmol/L (ref 0.0–2.0)
Bicarbonate: 26.1 mmol/L (ref 20.0–28.0)
Calcium, Ion: 1.15 mmol/L (ref 1.15–1.40)
HCT: 24 % — ABNORMAL LOW (ref 39.0–52.0)
Hemoglobin: 8.2 g/dL — ABNORMAL LOW (ref 13.0–17.0)
O2 Saturation: 86 %
Potassium: 4.5 mmol/L (ref 3.5–5.1)
Sodium: 138 mmol/L (ref 135–145)
TCO2: 28 mmol/L (ref 22–32)
pCO2, Ven: 51.5 mmHg (ref 44–60)
pH, Ven: 7.314 (ref 7.25–7.43)
pO2, Ven: 57 mmHg — ABNORMAL HIGH (ref 32–45)

## 2023-05-22 LAB — GLUCOSE, CAPILLARY
Glucose-Capillary: 206 mg/dL — ABNORMAL HIGH (ref 70–99)
Glucose-Capillary: 226 mg/dL — ABNORMAL HIGH (ref 70–99)
Glucose-Capillary: 200 mg/dL — ABNORMAL HIGH (ref 70–99)
Glucose-Capillary: 230 mg/dL — ABNORMAL HIGH (ref 70–99)
Glucose-Capillary: 219 mg/dL — ABNORMAL HIGH (ref 70–99)
Glucose-Capillary: 225 mg/dL — ABNORMAL HIGH (ref 70–99)
Glucose-Capillary: 237 mg/dL — ABNORMAL HIGH (ref 70–99)
Glucose-Capillary: 222 mg/dL — ABNORMAL HIGH (ref 70–99)
Glucose-Capillary: 223 mg/dL — ABNORMAL HIGH (ref 70–99)
Glucose-Capillary: 186 mg/dL — ABNORMAL HIGH (ref 70–99)

## 2023-05-22 LAB — PREPARE RBC (CROSSMATCH)

## 2023-05-22 LAB — PLATELET COUNT: Platelets: 113 10*3/uL — ABNORMAL LOW (ref 150–400)

## 2023-05-22 LAB — APTT: aPTT: 32 seconds (ref 24–36)

## 2023-05-22 LAB — ECHO INTRAOPERATIVE TEE

## 2023-05-22 SURGERY — CORONARY ARTERY BYPASS GRAFTING (CABG)
Anesthesia: General | Site: Chest

## 2023-05-22 MED ORDER — ARTIFICIAL TEARS OPHTHALMIC OINT
TOPICAL_OINTMENT | OPHTHALMIC | Status: DC | PRN
  Administered 2023-05-22: 1 via OPHTHALMIC

## 2023-05-22 MED ORDER — ALBUMIN HUMAN 5 % IV SOLN
250.0000 mL | INTRAVENOUS | Status: AC | PRN
  Administered 2023-05-22 (×3): 12.5 g via INTRAVENOUS
  Filled 2023-05-22 (×2): qty 250

## 2023-05-22 MED ORDER — SODIUM BICARBONATE 8.4 % IV SOLN
50.0000 meq | Freq: Once | INTRAVENOUS | Status: AC
  Administered 2023-05-22: 50 meq via INTRAVENOUS

## 2023-05-22 MED ORDER — PHENYLEPHRINE HCL (PRESSORS) 10 MG/ML IV SOLN
INTRAVENOUS | Status: DC | PRN
  Administered 2023-05-22 (×2): 80 ug via INTRAVENOUS
  Administered 2023-05-22: 40 ug via INTRAVENOUS
  Administered 2023-05-22: 80 ug via INTRAVENOUS
  Administered 2023-05-22: 40 ug via INTRAVENOUS
  Administered 2023-05-22 (×3): 80 ug via INTRAVENOUS
  Administered 2023-05-22 (×3): 40 ug via INTRAVENOUS
  Administered 2023-05-22: 80 ug via INTRAVENOUS
  Administered 2023-05-22 (×3): 40 ug via INTRAVENOUS
  Administered 2023-05-22: 80 ug via INTRAVENOUS
  Administered 2023-05-22 (×2): 40 ug via INTRAVENOUS
  Administered 2023-05-22: 80 ug via INTRAVENOUS

## 2023-05-22 MED ORDER — MORPHINE SULFATE (PF) 2 MG/ML IV SOLN
1.0000 mg | INTRAVENOUS | Status: DC | PRN
  Administered 2023-05-22: 1 mg via INTRAVENOUS
  Administered 2023-05-22: 4 mg via INTRAVENOUS
  Filled 2023-05-22: qty 2
  Filled 2023-05-22: qty 1

## 2023-05-22 MED ORDER — EPHEDRINE 5 MG/ML INJ
INTRAVENOUS | Status: AC
  Filled 2023-05-22: qty 5

## 2023-05-22 MED ORDER — FENTANYL CITRATE (PF) 250 MCG/5ML IJ SOLN
INTRAMUSCULAR | Status: AC
  Filled 2023-05-22: qty 5

## 2023-05-22 MED ORDER — MIDAZOLAM HCL 2 MG/2ML IJ SOLN: 2.0000 mg | INTRAMUSCULAR | Status: DC | PRN

## 2023-05-22 MED ORDER — PHENYLEPHRINE HCL-NACL 20-0.9 MG/250ML-% IV SOLN: 0.0000 ug/min | INTRAVENOUS | Status: DC

## 2023-05-22 MED ORDER — CALCIUM CHLORIDE 10 % IV SOLN
1.0000 g | Freq: Once | INTRAVENOUS | Status: AC
  Administered 2023-05-22: 1 g via INTRAVENOUS

## 2023-05-22 MED ORDER — DEXTROSE 50 % IV SOLN: 0.0000 mL | INTRAVENOUS | Status: DC | PRN

## 2023-05-22 MED ORDER — EPINEPHRINE HCL 5 MG/250ML IV SOLN IN NS: 0.5000 ug/min | INTRAVENOUS | Status: DC

## 2023-05-22 MED ORDER — ROCURONIUM BROMIDE 10 MG/ML (PF) SYRINGE
PREFILLED_SYRINGE | INTRAVENOUS | Status: AC
  Filled 2023-05-22: qty 20

## 2023-05-22 MED ORDER — VASOPRESSIN 20 UNIT/ML IV SOLN
INTRAVENOUS | Status: AC
  Filled 2023-05-22: qty 1

## 2023-05-22 MED ORDER — INSULIN REGULAR(HUMAN) IN NACL 100-0.9 UT/100ML-% IV SOLN
INTRAVENOUS | Status: DC
  Administered 2023-05-22: 15 [IU]/h via INTRAVENOUS
  Administered 2023-05-22: 4.2 [IU]/h via INTRAVENOUS
  Filled 2023-05-22: qty 100

## 2023-05-22 MED ORDER — VASOPRESSIN 20 UNITS/100 ML INFUSION FOR SHOCK
INTRAVENOUS | Status: DC | PRN
  Administered 2023-05-22: .03 [IU]/min via INTRAVENOUS

## 2023-05-22 MED ORDER — MAGNESIUM SULFATE 4 GM/100ML IV SOLN
4.0000 g | Freq: Once | INTRAVENOUS | Status: AC
  Administered 2023-05-22: 4 g via INTRAVENOUS
  Filled 2023-05-22: qty 100

## 2023-05-22 MED ORDER — CALCIUM CHLORIDE 10 % IV SOLN
INTRAVENOUS | Status: DC | PRN
  Administered 2023-05-22: 250 mg via INTRAVENOUS
  Administered 2023-05-22: 500 mg via INTRAVENOUS
  Administered 2023-05-22: 250 mg via INTRAVENOUS

## 2023-05-22 MED ORDER — NITROGLYCERIN IN D5W 200-5 MCG/ML-% IV SOLN: 0.0000 ug/min | INTRAVENOUS | Status: DC

## 2023-05-22 MED ORDER — METOPROLOL TARTRATE 5 MG/5ML IV SOLN: 2.5000 mg | INTRAVENOUS | Status: DC | PRN

## 2023-05-22 MED ORDER — BISACODYL 10 MG RE SUPP: 10.0000 mg | Freq: Every day | RECTAL | Status: DC

## 2023-05-22 MED ORDER — SODIUM BICARBONATE 8.4 % IV SOLN: 100.0000 meq | Freq: Once | INTRAVENOUS | Status: AC

## 2023-05-22 MED ORDER — PANTOPRAZOLE SODIUM 40 MG PO TBEC
40.0000 mg | DELAYED_RELEASE_TABLET | Freq: Every day | ORAL | Status: DC
  Administered 2023-05-24 – 2023-05-26 (×3): 40 mg via ORAL
  Filled 2023-05-22 (×3): qty 1

## 2023-05-22 MED ORDER — SODIUM CHLORIDE 0.45 % IV SOLN: INTRAVENOUS | Status: DC | PRN

## 2023-05-22 MED ORDER — SODIUM CHLORIDE 0.9% FLUSH
3.0000 mL | Freq: Two times a day (BID) | INTRAVENOUS | Status: DC
  Administered 2023-05-23 – 2023-05-26 (×7): 3 mL via INTRAVENOUS

## 2023-05-22 MED ORDER — MIDAZOLAM HCL 5 MG/5ML IJ SOLN
INTRAMUSCULAR | Status: DC | PRN
  Administered 2023-05-22: 4 mg via INTRAVENOUS
  Administered 2023-05-22 (×2): 1 mg via INTRAVENOUS

## 2023-05-22 MED ORDER — HEMOSTATIC AGENTS (NO CHARGE) OPTIME
TOPICAL | Status: DC | PRN
  Administered 2023-05-22: 1 via TOPICAL

## 2023-05-22 MED ORDER — EPHEDRINE SULFATE-NACL 50-0.9 MG/10ML-% IV SOSY
PREFILLED_SYRINGE | INTRAVENOUS | Status: DC | PRN
  Administered 2023-05-22 (×2): 5 mg via INTRAVENOUS

## 2023-05-22 MED ORDER — SODIUM CHLORIDE 0.9% FLUSH: 3.0000 mL | INTRAVENOUS | Status: DC | PRN

## 2023-05-22 MED ORDER — POTASSIUM CHLORIDE 10 MEQ/50ML IV SOLN
10.0000 meq | INTRAVENOUS | Status: AC
  Administered 2023-05-22 – 2023-05-23 (×3): 10 meq via INTRAVENOUS

## 2023-05-22 MED ORDER — SODIUM BICARBONATE 8.4 % IV SOLN
INTRAVENOUS | Status: AC
  Administered 2023-05-22: 100 meq via INTRAVENOUS
  Filled 2023-05-22: qty 50

## 2023-05-22 MED ORDER — DOCUSATE SODIUM 100 MG PO CAPS
200.0000 mg | ORAL_CAPSULE | Freq: Every day | ORAL | Status: DC
  Administered 2023-05-23 – 2023-05-28 (×5): 200 mg via ORAL
  Filled 2023-05-22 (×5): qty 2

## 2023-05-22 MED ORDER — ASPIRIN 81 MG PO CHEW: 324.0000 mg | CHEWABLE_TABLET | Freq: Every day | ORAL | Status: DC

## 2023-05-22 MED ORDER — PLASMA-LYTE A IV SOLN: INTRAVENOUS | Status: DC | PRN

## 2023-05-22 MED ORDER — METOPROLOL TARTRATE 12.5 MG HALF TABLET
12.5000 mg | ORAL_TABLET | Freq: Once | ORAL | Status: DC
  Filled 2023-05-22: qty 1

## 2023-05-22 MED ORDER — HEPARIN SODIUM (PORCINE) 1000 UNIT/ML IJ SOLN
INTRAMUSCULAR | Status: AC
  Filled 2023-05-22: qty 1

## 2023-05-22 MED ORDER — SODIUM CHLORIDE 0.9 % IV SOLN: 250.0000 mL | INTRAVENOUS | Status: DC

## 2023-05-22 MED ORDER — ARTIFICIAL TEARS OPHTHALMIC OINT
TOPICAL_OINTMENT | OPHTHALMIC | Status: AC
  Filled 2023-05-22: qty 3.5

## 2023-05-22 MED ORDER — SUCCINYLCHOLINE CHLORIDE 200 MG/10ML IV SOSY
PREFILLED_SYRINGE | INTRAVENOUS | Status: AC
  Filled 2023-05-22: qty 10

## 2023-05-22 MED ORDER — MIDAZOLAM HCL (PF) 10 MG/2ML IJ SOLN
INTRAMUSCULAR | Status: AC
  Filled 2023-05-22: qty 2

## 2023-05-22 MED ORDER — CHLORHEXIDINE GLUCONATE 0.12 % MT SOLN
15.0000 mL | Freq: Once | OROMUCOSAL | Status: DC
  Filled 2023-05-22: qty 15

## 2023-05-22 MED ORDER — ONDANSETRON HCL 4 MG/2ML IJ SOLN: 4.0000 mg | Freq: Four times a day (QID) | INTRAMUSCULAR | Status: DC | PRN

## 2023-05-22 MED ORDER — METOPROLOL TARTRATE 12.5 MG HALF TABLET
12.5000 mg | ORAL_TABLET | Freq: Two times a day (BID) | ORAL | Status: DC
  Administered 2023-05-23 – 2023-05-24 (×3): 12.5 mg via ORAL
  Filled 2023-05-22 (×4): qty 1

## 2023-05-22 MED ORDER — SODIUM CHLORIDE 0.9 % IV SOLN: INTRAVENOUS | Status: DC

## 2023-05-22 MED ORDER — LACTATED RINGERS IV SOLN: INTRAVENOUS | Status: DC | PRN

## 2023-05-22 MED ORDER — DEXMEDETOMIDINE HCL IN NACL 400 MCG/100ML IV SOLN
0.0000 ug/kg/h | INTRAVENOUS | Status: DC
  Administered 2023-05-22: 0.5 ug/kg/h via INTRAVENOUS
  Filled 2023-05-22: qty 100

## 2023-05-22 MED ORDER — PROPOFOL 10 MG/ML IV BOLUS
INTRAVENOUS | Status: AC
  Filled 2023-05-22: qty 20

## 2023-05-22 MED ORDER — CHLORHEXIDINE GLUCONATE 0.12 % MT SOLN
15.0000 mL | OROMUCOSAL | Status: AC
  Administered 2023-05-22: 15 mL via OROMUCOSAL
  Filled 2023-05-22: qty 15

## 2023-05-22 MED ORDER — PHENYLEPHRINE 80 MCG/ML (10ML) SYRINGE FOR IV PUSH (FOR BLOOD PRESSURE SUPPORT)
PREFILLED_SYRINGE | INTRAVENOUS | Status: AC
  Filled 2023-05-22: qty 10

## 2023-05-22 MED ORDER — VASOPRESSIN 20 UNITS/100 ML INFUSION FOR SHOCK
0.0000 [IU]/min | INTRAVENOUS | Status: DC
  Administered 2023-05-22: 0.03 [IU]/min via INTRAVENOUS
  Filled 2023-05-22: qty 100

## 2023-05-22 MED ORDER — TRAMADOL HCL 50 MG PO TABS
50.0000 mg | ORAL_TABLET | ORAL | Status: DC | PRN
  Administered 2023-05-23 – 2023-05-24 (×4): 50 mg via ORAL
  Filled 2023-05-22 (×4): qty 1

## 2023-05-22 MED ORDER — PROPOFOL 10 MG/ML IV BOLUS
INTRAVENOUS | Status: DC | PRN
  Administered 2023-05-22: 50 ug via INTRAVENOUS

## 2023-05-22 MED ORDER — ALBUMIN HUMAN 5 % IV SOLN: INTRAVENOUS | Status: DC | PRN

## 2023-05-22 MED ORDER — ACETAMINOPHEN 650 MG RE SUPP
650.0000 mg | Freq: Once | RECTAL | Status: AC
  Administered 2023-05-22: 650 mg via RECTAL

## 2023-05-22 MED ORDER — ASPIRIN 325 MG PO TBEC
325.0000 mg | DELAYED_RELEASE_TABLET | Freq: Every day | ORAL | Status: DC
  Administered 2023-05-23 – 2023-05-28 (×6): 325 mg via ORAL
  Filled 2023-05-22 (×6): qty 1

## 2023-05-22 MED ORDER — LACTATED RINGERS IV SOLN: INTRAVENOUS | Status: DC

## 2023-05-22 MED ORDER — PROTAMINE SULFATE 10 MG/ML IV SOLN
INTRAVENOUS | Status: AC
  Filled 2023-05-22: qty 5

## 2023-05-22 MED ORDER — PHENYLEPHRINE 80 MCG/ML (10ML) SYRINGE FOR IV PUSH (FOR BLOOD PRESSURE SUPPORT)
PREFILLED_SYRINGE | INTRAVENOUS | Status: AC
  Filled 2023-05-22: qty 20

## 2023-05-22 MED ORDER — CEFAZOLIN SODIUM-DEXTROSE 2-4 GM/100ML-% IV SOLN
2.0000 g | Freq: Three times a day (TID) | INTRAVENOUS | Status: AC
  Administered 2023-05-22 – 2023-05-24 (×6): 2 g via INTRAVENOUS
  Filled 2023-05-22 (×6): qty 100

## 2023-05-22 MED ORDER — ACETAMINOPHEN 160 MG/5ML PO SOLN
1000.0000 mg | Freq: Four times a day (QID) | ORAL | Status: AC
  Administered 2023-05-23: 1000 mg
  Filled 2023-05-22: qty 40.6

## 2023-05-22 MED ORDER — SODIUM CHLORIDE (PF) 0.9 % IJ SOLN: OROMUCOSAL | Status: DC | PRN

## 2023-05-22 MED ORDER — ~~LOC~~ CARDIAC SURGERY, PATIENT & FAMILY EDUCATION
Freq: Once | Status: DC
  Filled 2023-05-22: qty 1

## 2023-05-22 MED ORDER — CHLORHEXIDINE GLUCONATE CLOTH 2 % EX PADS
6.0000 | MEDICATED_PAD | Freq: Every day | CUTANEOUS | Status: DC
  Administered 2023-05-23 – 2023-05-28 (×6): 6 via TOPICAL

## 2023-05-22 MED ORDER — FAMOTIDINE IN NACL 20-0.9 MG/50ML-% IV SOLN
20.0000 mg | Freq: Two times a day (BID) | INTRAVENOUS | Status: AC
  Administered 2023-05-22 (×2): 20 mg via INTRAVENOUS
  Filled 2023-05-22 (×2): qty 50

## 2023-05-22 MED ORDER — FENTANYL CITRATE (PF) 250 MCG/5ML IJ SOLN
INTRAMUSCULAR | Status: DC | PRN
  Administered 2023-05-22 (×3): 50 ug via INTRAVENOUS
  Administered 2023-05-22: 150 ug via INTRAVENOUS
  Administered 2023-05-22: 100 ug via INTRAVENOUS
  Administered 2023-05-22: 250 ug via INTRAVENOUS
  Administered 2023-05-22: 50 ug via INTRAVENOUS
  Administered 2023-05-22: 200 ug via INTRAVENOUS
  Administered 2023-05-22: 100 ug via INTRAVENOUS

## 2023-05-22 MED ORDER — ALPRAZOLAM 0.5 MG PO TABS
0.5000 mg | ORAL_TABLET | Freq: Every day | ORAL | Status: DC
  Administered 2023-05-23 – 2023-05-27 (×5): 0.5 mg via ORAL
  Filled 2023-05-22 (×5): qty 1

## 2023-05-22 MED ORDER — VANCOMYCIN HCL IN DEXTROSE 1-5 GM/200ML-% IV SOLN
1000.0000 mg | Freq: Once | INTRAVENOUS | Status: AC
  Administered 2023-05-22: 1000 mg via INTRAVENOUS
  Filled 2023-05-22: qty 200

## 2023-05-22 MED ORDER — METOPROLOL TARTRATE 25 MG/10 ML ORAL SUSPENSION: 12.5000 mg | Freq: Two times a day (BID) | ORAL | Status: DC

## 2023-05-22 MED ORDER — PROTAMINE SULFATE 10 MG/ML IV SOLN
INTRAVENOUS | Status: AC
  Filled 2023-05-22: qty 25

## 2023-05-22 MED ORDER — ACETAMINOPHEN 500 MG PO TABS
1000.0000 mg | ORAL_TABLET | Freq: Four times a day (QID) | ORAL | Status: AC
  Administered 2023-05-23 – 2023-05-27 (×12): 1000 mg via ORAL
  Filled 2023-05-22 (×17): qty 2

## 2023-05-22 MED ORDER — NOREPINEPHRINE 4 MG/250ML-% IV SOLN
0.0000 ug/min | INTRAVENOUS | Status: DC
  Administered 2023-05-22: 15 ug/min via INTRAVENOUS
  Filled 2023-05-22 (×2): qty 250

## 2023-05-22 MED ORDER — STERILE WATER FOR IRRIGATION IR SOLN
Status: DC | PRN
  Administered 2023-05-22: 2000 mL

## 2023-05-22 MED ORDER — CHLORHEXIDINE GLUCONATE 4 % EX SOLN: 30.0000 mL | CUTANEOUS | Status: DC

## 2023-05-22 MED ORDER — MIRTAZAPINE 15 MG PO TABS
7.5000 mg | ORAL_TABLET | Freq: Every day | ORAL | Status: DC
  Administered 2023-05-23 – 2023-05-27 (×5): 7.5 mg via ORAL
  Filled 2023-05-22 (×6): qty 1

## 2023-05-22 MED ORDER — BISACODYL 5 MG PO TBEC
10.0000 mg | DELAYED_RELEASE_TABLET | Freq: Every day | ORAL | Status: DC
  Administered 2023-05-23 – 2023-05-28 (×4): 10 mg via ORAL
  Filled 2023-05-22 (×4): qty 2

## 2023-05-22 MED ORDER — HEPARIN SODIUM (PORCINE) 1000 UNIT/ML IJ SOLN
INTRAMUSCULAR | Status: DC | PRN
  Administered 2023-05-22: 27000 [IU] via INTRAVENOUS
  Administered 2023-05-22: 2000 [IU] via INTRAVENOUS

## 2023-05-22 MED ORDER — VASOPRESSIN 20 UNIT/ML IV SOLN
INTRAVENOUS | Status: DC | PRN
  Administered 2023-05-22 (×3): .5 [IU] via INTRAVENOUS
  Administered 2023-05-22: 1 [IU] via INTRAVENOUS
  Administered 2023-05-22: .5 [IU] via INTRAVENOUS

## 2023-05-22 MED ORDER — 0.9 % SODIUM CHLORIDE (POUR BTL) OPTIME
TOPICAL | Status: DC | PRN
  Administered 2023-05-22: 5000 mL

## 2023-05-22 MED ORDER — LACTATED RINGERS IV SOLN: 500.0000 mL | Freq: Once | INTRAVENOUS | Status: DC | PRN

## 2023-05-22 MED ORDER — ATORVASTATIN CALCIUM 80 MG PO TABS
80.0000 mg | ORAL_TABLET | Freq: Every day | ORAL | Status: DC
  Administered 2023-05-23 – 2023-05-28 (×6): 80 mg via ORAL
  Filled 2023-05-22 (×6): qty 1

## 2023-05-22 MED ORDER — ROCURONIUM BROMIDE 10 MG/ML (PF) SYRINGE
PREFILLED_SYRINGE | INTRAVENOUS | Status: AC
  Filled 2023-05-22: qty 10

## 2023-05-22 MED ORDER — ACETAMINOPHEN 160 MG/5ML PO SOLN: 650.0000 mg | Freq: Once | ORAL | Status: AC

## 2023-05-22 MED ORDER — ROCURONIUM BROMIDE 100 MG/10ML IV SOLN
INTRAVENOUS | Status: DC | PRN
  Administered 2023-05-22: 50 mg via INTRAVENOUS
  Administered 2023-05-22: 100 mg via INTRAVENOUS
  Administered 2023-05-22: 30 mg via INTRAVENOUS
  Administered 2023-05-22: 50 mg via INTRAVENOUS

## 2023-05-22 MED ORDER — PROTAMINE SULFATE 10 MG/ML IV SOLN
INTRAVENOUS | Status: DC | PRN
  Administered 2023-05-22: 290 mg via INTRAVENOUS

## 2023-05-22 MED ORDER — OXYCODONE HCL 5 MG PO TABS
5.0000 mg | ORAL_TABLET | ORAL | Status: DC | PRN
  Administered 2023-05-23 – 2023-05-28 (×7): 10 mg via ORAL
  Filled 2023-05-22 (×7): qty 2

## 2023-05-22 MED ORDER — SODIUM CHLORIDE 0.9 % IV SOLN: INTRAVENOUS | Status: DC | PRN

## 2023-05-22 MED ORDER — SERTRALINE HCL 50 MG PO TABS
150.0000 mg | ORAL_TABLET | Freq: Every day | ORAL | Status: DC
  Administered 2023-05-23 – 2023-05-28 (×6): 150 mg via ORAL
  Filled 2023-05-22 (×6): qty 1

## 2023-05-22 MED ORDER — METHYLPREDNISOLONE SODIUM SUCC 125 MG IJ SOLR
60.0000 mg | Freq: Once | INTRAMUSCULAR | Status: AC
  Administered 2023-05-22: 60 mg via INTRAVENOUS
  Filled 2023-05-22: qty 2

## 2023-05-22 MED ORDER — LIDOCAINE 2% (20 MG/ML) 5 ML SYRINGE
INTRAMUSCULAR | Status: AC
  Filled 2023-05-22: qty 5

## 2023-05-22 MED ORDER — TAMSULOSIN HCL 0.4 MG PO CAPS
0.4000 mg | ORAL_CAPSULE | Freq: Every day | ORAL | Status: DC
  Administered 2023-05-23 – 2023-05-27 (×5): 0.4 mg via ORAL
  Filled 2023-05-22 (×6): qty 1

## 2023-05-22 MED ORDER — POTASSIUM CHLORIDE 10 MEQ/50ML IV SOLN: 10.0000 meq | INTRAVENOUS | Status: AC

## 2023-05-22 SURGICAL SUPPLY — 105 items
ADH SKN CLS APL DERMABOND .7 (GAUZE/BANDAGES/DRESSINGS) ×2
BAG DECANTER FOR FLEXI CONT (MISCELLANEOUS) ×2 IMPLANT
BLADE CLIPPER SURG (BLADE) ×2 IMPLANT
BLADE STERNUM SYSTEM 6 (BLADE) ×2 IMPLANT
BLADE SURG 11 STRL SS (BLADE) IMPLANT
BNDG CMPR MED 10X6 ELC LF (GAUZE/BANDAGES/DRESSINGS) ×2
BNDG CMPR STD VLCR NS LF 5.8X3 (GAUZE/BANDAGES/DRESSINGS) ×2
BNDG ELASTIC 3X5.8 VLCR NS LF (GAUZE/BANDAGES/DRESSINGS) IMPLANT
BNDG ELASTIC 6X10 VLCR STRL LF (GAUZE/BANDAGES/DRESSINGS) IMPLANT
BNDG GAUZE DERMACEA FLUFF 4 (GAUZE/BANDAGES/DRESSINGS) IMPLANT
BNDG GZE DERMACEA 4 6PLY (GAUZE/BANDAGES/DRESSINGS) ×2
CANISTER SUCT 3000ML PPV (MISCELLANEOUS) ×2 IMPLANT
CANNULA EZ GLIDE AORTIC 21FR (CANNULA) ×2 IMPLANT
CANNULA MC2 2 STG 36/46 NON-V (CANNULA) IMPLANT
CATH CPB KIT HENDRICKSON (MISCELLANEOUS) ×2 IMPLANT
CATH ROBINSON RED A/P 18FR (CATHETERS) ×2 IMPLANT
CATH THORACIC 28FR (CATHETERS) IMPLANT
CATH THORACIC 36FR (CATHETERS) ×2 IMPLANT
CATH THORACIC 36FR RT ANG (CATHETERS) ×2 IMPLANT
CLIP TI MEDIUM 24 (CLIP) IMPLANT
CLIP TI WIDE RED SMALL 24 (CLIP) IMPLANT
CNTNR URN SCR LID CUP LEK RST (MISCELLANEOUS) IMPLANT
CONT SPEC 4OZ STRL OR WHT (MISCELLANEOUS) ×8
CONTAINER PROTECT SURGISLUSH (MISCELLANEOUS) ×4 IMPLANT
DERMABOND ADVANCED .7 DNX12 (GAUZE/BANDAGES/DRESSINGS) IMPLANT
DRAPE CARDIOVASCULAR INCISE (DRAPES) ×2
DRAPE SRG 135X102X78XABS (DRAPES) ×2 IMPLANT
DRAPE WARM FLUID 44X44 (DRAPES) ×2 IMPLANT
DRSG COVADERM 4X14 (GAUZE/BANDAGES/DRESSINGS) ×2 IMPLANT
ELECT BLADE 4.0 EZ CLEAN MEGAD (MISCELLANEOUS) ×2
ELECT REM PT RETURN 9FT ADLT (ELECTROSURGICAL) ×4
ELECTRODE BLDE 4.0 EZ CLN MEGD (MISCELLANEOUS) IMPLANT
ELECTRODE REM PT RTRN 9FT ADLT (ELECTROSURGICAL) ×4 IMPLANT
FELT TEFLON 1X6 (MISCELLANEOUS) ×4 IMPLANT
GAUZE SPONGE 4X4 12PLY STRL (GAUZE/BANDAGES/DRESSINGS) ×4 IMPLANT
GAUZE SPONGE 4X4 12PLY STRL LF (GAUZE/BANDAGES/DRESSINGS) IMPLANT
GLOVE BIO SURGEON STRL SZ7 (GLOVE) IMPLANT
GLOVE BIOGEL PI IND STRL 6 (GLOVE) IMPLANT
GLOVE BIOGEL PI IND STRL 6.5 (GLOVE) IMPLANT
GLOVE INDICATOR 7.5 STRL GRN (GLOVE) IMPLANT
GLOVE SS BIOGEL STRL SZ 6 (GLOVE) IMPLANT
GLOVE SS BIOGEL STRL SZ 7.5 (GLOVE) ×2 IMPLANT
GLOVE SURG SIGNA 7.5 PF LTX (GLOVE) ×6 IMPLANT
GLOVE SURG SS PI 8.0 STRL IVOR (GLOVE) IMPLANT
GOWN STRL REUS W/ TWL LRG LVL3 (GOWN DISPOSABLE) ×8 IMPLANT
GOWN STRL REUS W/ TWL XL LVL3 (GOWN DISPOSABLE) ×4 IMPLANT
GOWN STRL REUS W/TWL LRG LVL3 (GOWN DISPOSABLE) ×8
GOWN STRL REUS W/TWL XL LVL3 (GOWN DISPOSABLE) ×4
HEMOSTAT POWDER SURGIFOAM 1G (HEMOSTASIS) ×6 IMPLANT
HEMOSTAT SURGICEL 2X14 (HEMOSTASIS) ×2 IMPLANT
INSERT FOGARTY XLG (MISCELLANEOUS) IMPLANT
KIT BASIN OR (CUSTOM PROCEDURE TRAY) ×2 IMPLANT
KIT SUCTION CATH 14FR (SUCTIONS) ×4 IMPLANT
KIT TURNOVER KIT B (KITS) ×2 IMPLANT
KIT VASOVIEW HEMOPRO 2 VH 4000 (KITS) ×2 IMPLANT
MARKER GRAFT CORONARY BYPASS (MISCELLANEOUS) ×6 IMPLANT
NDL BIOPSY 14X6 SOFT TISS (NEEDLE) IMPLANT
NEEDLE BIOPSY 14X6 SOFT TISS (NEEDLE) ×2 IMPLANT
NS IRRIG 1000ML POUR BTL (IV SOLUTION) ×10 IMPLANT
PACK E OPEN HEART (SUTURE) ×2 IMPLANT
PACK OPEN HEART (CUSTOM PROCEDURE TRAY) ×2 IMPLANT
PAD ARMBOARD 7.5X6 YLW CONV (MISCELLANEOUS) ×4 IMPLANT
PAD ELECT DEFIB RADIOL ZOLL (MISCELLANEOUS) ×2 IMPLANT
PENCIL BUTTON HOLSTER BLD 10FT (ELECTRODE) ×2 IMPLANT
POSITIONER HEAD DONUT 9IN (MISCELLANEOUS) ×2 IMPLANT
PUNCH AORTIC ROTATE 4.0MM (MISCELLANEOUS) IMPLANT
PUNCH AORTIC ROTATE 4.5MM 8IN (MISCELLANEOUS) IMPLANT
SET MPS 3-ND DEL (MISCELLANEOUS) IMPLANT
SPONGE T-LAP 18X18 ~~LOC~~+RFID (SPONGE) IMPLANT
SUPPORT HEART JANKE-BARRON (MISCELLANEOUS) ×2 IMPLANT
SUT BONE WAX W31G (SUTURE) ×2 IMPLANT
SUT ETHIBOND 2 0 SH (SUTURE) ×2
SUT ETHIBOND 2 0 SH 36X2 (SUTURE) IMPLANT
SUT MNCRL AB 4-0 PS2 18 (SUTURE) IMPLANT
SUT PROLENE 3 0 SH DA (SUTURE) ×2 IMPLANT
SUT PROLENE 4 0 RB 1 (SUTURE) ×6
SUT PROLENE 4 0 SH DA (SUTURE) IMPLANT
SUT PROLENE 4-0 RB1 .5 CRCL 36 (SUTURE) IMPLANT
SUT PROLENE 5 0 C 1 36 (SUTURE) IMPLANT
SUT PROLENE 6 0 C 1 30 (SUTURE) ×4 IMPLANT
SUT PROLENE 7 0 BV 1 (SUTURE) IMPLANT
SUT PROLENE 7 0 BV1 MDA (SUTURE) ×2 IMPLANT
SUT PROLENE 8 0 BV175 6 (SUTURE) IMPLANT
SUT SILK 1 MH (SUTURE) IMPLANT
SUT STEEL 6MS V (SUTURE) ×2 IMPLANT
SUT STEEL STERNAL CCS#1 18IN (SUTURE) IMPLANT
SUT STEEL SZ 6 DBL 3X14 BALL (SUTURE) ×2 IMPLANT
SUT TEM PAC WIRE 2 0 SH (SUTURE) IMPLANT
SUT VIC AB 1 CTX 36 (SUTURE) ×4
SUT VIC AB 1 CTX36XBRD ANBCTR (SUTURE) ×4 IMPLANT
SUT VIC AB 2-0 CT1 27 (SUTURE) ×2
SUT VIC AB 2-0 CT1 TAPERPNT 27 (SUTURE) IMPLANT
SUT VIC AB 2-0 CTX 27 (SUTURE) IMPLANT
SUT VIC AB 3-0 SH 27 (SUTURE) ×2
SUT VIC AB 3-0 SH 27X BRD (SUTURE) IMPLANT
SUT VIC AB 3-0 X1 27 (SUTURE) IMPLANT
SYSTEM SAHARA CHEST DRAIN ATS (WOUND CARE) ×2 IMPLANT
TAPE CLOTH SURG 4X10 WHT LF (GAUZE/BANDAGES/DRESSINGS) IMPLANT
TAPE PAPER 2X10 WHT MICROPORE (GAUZE/BANDAGES/DRESSINGS) IMPLANT
TOWEL GREEN STERILE (TOWEL DISPOSABLE) ×2 IMPLANT
TOWEL GREEN STERILE FF (TOWEL DISPOSABLE) ×2 IMPLANT
TRAY FOLEY SLVR 16FR TEMP STAT (SET/KITS/TRAYS/PACK) ×2 IMPLANT
TUBING LAP HI FLOW INSUFFLATIO (TUBING) ×2 IMPLANT
UNDERPAD 30X36 HEAVY ABSORB (UNDERPADS AND DIAPERS) ×2 IMPLANT
WATER STERILE IRR 1000ML POUR (IV SOLUTION) ×4 IMPLANT

## 2023-05-22 NOTE — Hospital Course (Addendum)
PCP:  Barbie Banner, MD Cardiologist:  Rollene Rotunda, MD     History of Present Illness: Gregory [name] is an 89 year old man with a history of remote tobacco use, hypertension, hyperlipidemia, right MCA stroke, non-ST elevation MI, CAD, stage IIIa chronic kidney disease, carotid disease, carotid stenting, and a right upper lobe lung nodule.   Complicated situation as he was being evaluated for possible lung resection and then presented with unstable anginal symptoms.  At catheterization has two-vessel disease not amenable to percutaneous intervention.  Initial consideration was given to medical therapy.  I discussed the situation with Gregory [name] and his family.  He is a candidate for coronary bypass grafting albeit at high risk due to his advanced age and comorbidities.  I would not consider combined lung resection given his age but may be able to biopsy of the right upper lobe nodule to get definitive diagnosis.   Coronary bypass grafting is indicated for survival benefit and relief of symptoms.   I discussed the proposed operative procedure with Gregory [name] and his family.  They understand the need for general anesthesia, incisions to be used, the possible need for cardiopulmonary bypass, use of drainage tubes and temporary pacemaker wires postoperatively, the expected hospital stay, and the overall recovery.  I informed them of the indications, risk, benefits, and alternatives.  They understand high risk due to advanced age, previous stroke, chronic kidney disease, and possible lung cancer.  They understand the risks include, but not limited to death, MI, stroke, DVT, PE, bleeding, possible need for transfusion, infection, cardiac arrhythmias, respiratory or renal failure, as well as possibility of other unforeseeable complications.   If he decides to proceed with coronary bypass surgery we could do it on Monday, 05/22/2023.  Would need to stop Brilinta.  Hospital Course: Gregory [name] was admitted for  elective surgery on 05/22/23 and taken to the OR where CABG x 2 was performed along with biopsy of the right upper lobe nodule. Following the procedure he separated from cardiopulmonary bypass without difficulty and was transferred to the CV ICU in stable condition. He remained hemodynamically stable.  He was weaned from ventilator support and extubated by 2:30am on post-op day 1.  He remained hemodynamically stable.  Chest tubes were removed on the first postoperative day.  He was noted to have expected postoperative anemia that remained stable and required no transfusion.  We also had thrombocytopenia with initial postop platelet count of 80,000.  Platelet count trended down to the mid 70s over the next few days then began to recover.  He did not have any evidence of ongoing bleeding.  He was mobilized and progressed well with ambulation while in the ICU.  He initially required high flow oxygen in order to maintain adequate oxygenation.  Chest x-ray showed some diffuse vascular congestion as well as opacity in the right upper lobe following lung biopsy at the time of surgery.  His oxygenation in the chest x-ray improved with diuresis.  He had a brief episode of rate controlled atrial fibrillation on postop day 3 lasting about 1 hour.  He was started on oral amiodarone loading.  Metoprolol was also increased to 25 mg twice daily.  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  Oxygenation gradually improved as he was diuresed.  By postop day 4, he was ready for transfer to [facility].  He was advanced and continued work on pulmonary hygiene was encouraged.  He was gradually weaned from HFNC.  Diuresis continued with oral Lasix.  He did have a fib with CVR later 06/08.  Regarding diuresis, Lasix was decreased to 40 mg daily on 06/08 and creatinine (he has a history of CKD (stage IIIb) was watched carefully. It was decreased to 1.36 on 06/09. He will be discharged on  Lasix 20 mg daily with every other day potassium supplement. He was weaned from HFNC to [place] to room air by 06/09. His pre op HGA1C was 6.9. He will need to follow up with his medical doctor after discharge for new diabetes diagnosis. Nutrition information was provided with his discharge paperwork. Regarding rhythm, he had a fib with CVR a few times on 06/08. As discussed with Dr. Cliffton Asters, given advanced age, will not anticoagulate. Will ask nurse to document oxygen saturation at rest and with ambulation to determine if needs oxygen at discharge. Per nurse's note, he does not need (or meet requirements) for home oxygen (as per documentation). Arrangements were made for home health physical therapy and occupational therapy per the inpatient therapists' recommendations.

## 2023-05-22 NOTE — Discharge Instructions (Addendum)
Discharge Instructions:  1. You may shower, please wash incisions daily with soap and water and keep dry.  If you wish to cover wounds with dressing you may do so but please keep clean and change daily.  No tub baths or swimming until incisions have completely healed.  If your incisions become red or develop any drainage please call our office at [phone]  2. No Driving until cleared by Dr. Sunday Corn office and you are no longer using narcotic pain medications  3. Monitor your weight daily.. Please use the same scale and weigh at same time... If you gain 5-10 lbs in 48 hours with associated lower extremity swelling, please contact our office at [phone]  4. Fever of 101.5 for at least 24 hours with no source, please contact our office at [phone]  5. Activity- up as tolerated, please walk at least 3 times per day.  Avoid strenuous activity, no lifting, pushing, or pulling with your arms over 8-10 lbs for a minimum of 6 weeks  6. If any questions or concerns arise, please do not hesitate to contact our office at [phone]  iabetes Mellitus and Nutrition, Adult When you have diabetes, or diabetes mellitus, it is very important to have healthy eating habits because your blood sugar (glucose) levels are greatly affected by what you eat and drink. Eating healthy foods in the right amounts, at about the same times every day, can help you: Manage your blood glucose. Lower your risk of heart disease. Improve your blood pressure. Reach or maintain a healthy weight. What can affect my meal plan? Every person with diabetes is different, and each person has different needs for a meal plan. Your health care provider may recommend that you work with a dietitian to make a meal plan that is best for you. Your meal plan may vary depending on factors such as: The calories you need. The medicines you take. Your weight. Your blood glucose, blood pressure, and cholesterol levels. Your  activity level. Other health conditions you have, such as heart or kidney disease. How do carbohydrates affect me? Carbohydrates, also called carbs, affect your blood glucose level more than any other type of food. Eating carbs raises the amount of glucose in your blood. It is important to know how many carbs you can safely have in each meal. This is different for every person. Your dietitian can help you calculate how many carbs you should have at each meal and for each snack. How does alcohol affect me? Alcohol can cause a decrease in blood glucose (hypoglycemia), especially if you use insulin or take certain diabetes medicines by mouth. Hypoglycemia can be a life-threatening condition. Symptoms of hypoglycemia, such as sleepiness, dizziness, and confusion, are similar to symptoms of having too much alcohol. Do not drink alcohol if: Your health care provider tells you not to drink. You are pregnant, may be pregnant, or are planning to become pregnant. If you drink alcohol: Limit how much you have to: 0-1 drink a day for women. 0-2 drinks a day for men. Know how much alcohol is in your drink. In the [place] one drink equals one 12 oz bottle of beer (355 mL), one 5 oz glass of wine (148 mL), or one 1 oz glass of hard liquor (44 mL). Keep yourself hydrated with water, diet soda, or unsweetened iced tea. Keep in mind that regular soda, juice, and other mixers may contain a lot of sugar and must be counted as carbs. What are tips for following this  plan?  Reading food labels Start by checking the serving size on the Nutrition Facts label of packaged foods and drinks. The number of calories and the amount of carbs, fats, and other nutrients listed on the label are based on one serving of the item. Many items contain more than one serving per package. Check the total grams (g) of carbs in one serving. Check the number of grams of saturated fats and trans fats in one serving. Choose foods that have a  low amount or none of these fats. Check the number of milligrams (mg) of salt (sodium) in one serving. Most people should limit total sodium intake to less than 2,300 mg per day. Always check the nutrition information of foods labeled as "low-fat" or "nonfat." These foods may be higher in added sugar or refined carbs and should be avoided. Talk to your dietitian to identify your daily goals for nutrients listed on the label. Shopping Avoid buying canned, pre-made, or processed foods. These foods tend to be high in fat, sodium, and added sugar. Shop around the outside edge of the grocery store. This is where you will most often find fresh fruits and vegetables, bulk grains, fresh meats, and fresh dairy products. Cooking Use low-heat cooking methods, such as baking, instead of high-heat cooking methods, such as deep frying. Cook using healthy oils, such as olive, canola, or sunflower oil. Avoid cooking with butter, cream, or high-fat meats. Meal planning Eat meals and snacks regularly, preferably at the same times every day. Avoid going long periods of time without eating. Eat foods that are high in fiber, such as fresh fruits, vegetables, beans, and whole grains. Eat 4-6 oz (112-168 g) of lean protein each day, such as lean meat, chicken, fish, eggs, or tofu. One ounce (oz) (28 g) of lean protein is equal to: 1 oz (28 g) of meat, chicken, or fish. 1 egg.  cup (62 g) of tofu. Eat some foods each day that contain healthy fats, such as avocado, nuts, seeds, and fish. What foods should I eat? Fruits Berries. Apples. Oranges. Peaches. Apricots. Plums. Grapes. Mangoes. Papayas. Pomegranates. Kiwi. Cherries. Vegetables Leafy greens, including lettuce, spinach, kale, chard, collard greens, mustard greens, and cabbage. Beets. Cauliflower. Broccoli. Carrots. Green beans. Tomatoes. Peppers. Onions. Cucumbers. Brussels sprouts. Grains Whole grains, such as whole-wheat or whole-grain bread, crackers,  tortillas, cereal, and pasta. Unsweetened oatmeal. Quinoa. Brown or wild rice. Meats and other proteins Seafood. Poultry without skin. Lean cuts of poultry and beef. Tofu. Nuts. Seeds. Dairy Low-fat or fat-free dairy products such as milk, yogurt, and cheese. The items listed above may not be a complete list of foods and beverages you can eat and drink. Contact a dietitian for more information. What foods should I avoid? Fruits Fruits canned with syrup. Vegetables Canned vegetables. Frozen vegetables with butter or cream sauce. Grains Refined white flour and flour products such as bread, pasta, snack foods, and cereals. Avoid all processed foods. Meats and other proteins Fatty cuts of meat. Poultry with skin. Breaded or fried meats. Processed meat. Avoid saturated fats. Dairy Full-fat yogurt, cheese, or milk. Beverages Sweetened drinks, such as soda or iced tea. The items listed above may not be a complete list of foods and beverages you should avoid. Contact a dietitian for more information. Questions to ask a health care provider Do I need to meet with a certified diabetes care and education specialist? Do I need to meet with a dietitian? What number can I call if I have questions?  When are the best times to check my blood glucose? Where to find more information: American Diabetes Association: diabetes.org Academy of Nutrition and Dietetics: eatright.[employer] of Diabetes and Digestive and Kidney Diseases: StageSync.si Association of Diabetes Care & Education Specialists: diabeteseducator.org Summary It is important to have healthy eating habits because your blood sugar (glucose) levels are greatly affected by what you eat and drink. It is important to use alcohol carefully. A healthy meal plan will help you manage your blood glucose and lower your risk of heart disease. Your health care provider may recommend that you work with a dietitian to make a meal plan that is  best for you. This information is not intended to replace advice given to you by your health care provider. Make sure you discuss any questions you have with your health care provider. Document Revised: 07/08/2020 Document Reviewed: 07/08/2020 Elsevier Patient Education  2024 [employer].

## 2023-05-22 NOTE — Anesthesia Procedure Notes (Signed)
Central Venous Catheter Insertion Performed by: Lewie Loron, MD, anesthesiologist Start/End6/02/2023 7:05 AM, 05/22/2023 7:10 AM Patient location: Pre-op. Preanesthetic checklist: patient identified, IV checked, site marked, risks and benefits discussed, surgical consent, monitors and equipment checked, pre-op evaluation, timeout performed and anesthesia consent Hand hygiene performed  and maximum sterile barriers used  PA cath was placed.Swan type:thermodilution PA Cath depth:48 Procedure performed without using ultrasound guided technique. Attempts: 1 Post procedure assessment: free fluid flow and no air  Patient tolerated the procedure well with no immediate complications.

## 2023-05-22 NOTE — Discharge Summary (Incomplete)
Physician Discharge Summary  Patient ID: Gregory [name] MRN: [id number] DOB/AGE: 1934-06-09 89 y.o.  Admit date: 05/22/2023 Discharge date: 05/28/2023  Admission Diagnoses: Patient Active Problem List   Diagnosis Date Noted    05/22/2023   Hyperlipidemia LDL goal <70 02/28/2023   Stage 3a chronic kidney disease (CKD) (HCC) 12/30/2022   Hyperglycemia 12/30/2022   Lung nodule 12/30/2022   Coronary artery disease involving native coronary artery of native heart with unstable angina pectoris (HCC) 12/30/2022   AAA (abdominal aortic aneurysm) without rupture (HCC) 12/30/2022   Carotid artery disease (HCC) 12/30/2022   Non-ST elevation (NSTEMI) myocardial infarction (HCC) 12/29/2022   AKI (acute kidney injury) (HCC) 12/25/2022   Primary hypertension 12/25/2022   History of CVA (cerebrovascular accident) 12/25/2022   Angina pectoris (HCC) 12/24/2022   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 01/02/2018   Tremor 06/14/2017   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/17/2013    Discharge Diagnoses:      Patient Active Problem List   Diagnosis Date Noted   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   Hyperlipidemia LDL goal <70 02/28/2023   Stage 3a chronic kidney disease (CKD) (HCC) 12/30/2022   Hyperglycemia 12/30/2022   Lung nodule 12/30/2022   Coronary artery disease involving native coronary artery of native heart with unstable angina pectoris (HCC) 12/30/2022   AAA (abdominal aortic aneurysm) without rupture (HCC) 12/30/2022   Carotid artery disease (HCC) 12/30/2022   Non-ST elevation (NSTEMI) myocardial infarction (HCC) 12/29/2022   AKI (acute kidney injury) (HCC) 12/25/2022   Primary hypertension 12/25/2022   History of CVA (cerebrovascular accident) 12/25/2022   Angina pectoris (HCC) 12/24/2022   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   Complicated situation as he was being evaluated for possible lung resection and then presented with unstable anginal symptoms.  At catheterization has two-vessel disease not amenable to percutaneous intervention.  Initial consideration was given to medical therapy.  I discussed the situation with Gregory [name] and his family.  He is a candidate for coronary bypass grafting albeit at high risk due to his advanced age and comorbidities.  I would not consider combined lung resection given his age but may be able to biopsy of the right upper lobe nodule to get definitive diagnosis.   Coronary bypass grafting is indicated for survival benefit and relief of symptoms.   I discussed the proposed operative procedure with Gregory [name] and his family.  They understand the need for general anesthesia, incisions to be used, the possible need for cardiopulmonary bypass, use of drainage tubes and temporary pacemaker wires postoperatively, the expected hospital stay, and the overall recovery.  I informed them of the indications, risk, benefits, and alternatives.  They understand high risk due to advanced age, previous stroke, chronic kidney disease, and possible lung cancer.  They understand the risks include, but not limited to death, MI, stroke, DVT, PE, bleeding, possible need for transfusion, infection, cardiac arrhythmias, respiratory or renal failure, as well as possibility of other  unforeseeable complications.   If he decides to proceed with coronary bypass surgery we could do it on Monday, 05/22/2023.  Would  need to stop Brilinta.  Hospital Course: Gregory [name] was admitted for elective surgery on 05/22/23 and taken to the OR where CABG x 2 was performed along with biopsy of the right upper lobe nodule. Following the procedure he separated from cardiopulmonary bypass without difficulty and was transferred to the CV ICU in stable condition. He remained hemodynamically stable.  He was weaned from ventilator support and extubated by 2:30am on post-op day 1.  He remained hemodynamically stable.  Chest tubes were removed on the first postoperative day.  He was noted to have expected postoperative anemia that remained stable and required no transfusion.  We also had thrombocytopenia with initial postop platelet count of 80,000.  Platelet count trended down to the mid 70s over the next few days then began to recover.  He did not have any evidence of ongoing bleeding.  He was mobilized and progressed well with ambulation while in the ICU.  He initially required high flow oxygen in order to maintain adequate oxygenation.  Chest x-ray showed some diffuse vascular congestion as well as opacity in the right upper lobe following lung biopsy at the time of surgery.  His oxygenation in the chest x-ray improved with diuresis.  He had a brief episode of rate controlled atrial fibrillation on postop day 3 lasting about 1 hour.  He was started on oral amiodarone loading.  Metoprolol was also increased to 25 mg twice daily.  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  Oxygenation gradually improved as he was diuresed.  By postop day 4, he was ready for transfer to [facility].  He was advanced and continued work on pulmonary hygiene was encouraged.  He was gradually weaned from HFNC. Diuresis continued with oral Lasix.  He did have a fib with CVR later 06/08.  Regarding diuresis, Lasix was decreased to 40 mg daily on 06/08 and creatinine (he has a history of CKD (stage IIIb) was watched  carefully. It was decreased to 1.36 on 06/09. He will be discharged on Lasix 20 mg daily with every other day potassium supplement. He was weaned from HFNC to [place] to room air by 06/09. His pre op HGA1C was 6.9. He will need to follow up with his medical doctor after discharge for new diabetes diagnosis. Nutrition information was provided with his discharge paperwork. Regarding rhythm, he had a fib with CVR a few times on 06/08. As discussed with Dr. Cliffton Asters, given advanced age, will not anticoagulate. Will ask nurse to document oxygen saturation at rest and with ambulation to determine if needs oxygen at discharge. Per nurse's note, he does not need (or meet requirements) for home oxygen (as per documentation). Arrangements were made for home health physical therapy and occupational therapy per the inpatient therapists' recommendations.     Consults: None  Significant Diagnostic Studies:   CLINICAL DATA:  Right-sided pneumothorax.   EXAM: CHEST - 2 VIEW   COMPARISON:  05/26/2023   FINDINGS: Cardiopericardial silhouette is at upper limits of normal for size. Low volume film. Tiny apical right pneumothorax is similar to prior. Left base atelectasis with tiny left effusion again noted. Telemetry leads overlie the chest.   IMPRESSION: Tiny apical right pneumothorax, similar to prior.     Electronically Signed   By: Kennith Center M.D.   On: 05/27/2023 09:04  Treatments: Surgery  DATE OF PROCEDURE: 05/22/2023   PREOPERATIVE DIAGNOSES:  Severe 2-vessel coronary artery disease and right upper lobe lung nodule.   POSTOPERATIVE DIAGNOSES:  Severe 2-vessel coronary artery disease and right upper lobe lung nodule.   PROCEDURE PERFORMED:  Median sternotomy, extracorporeal circulation, coronary artery bypass grafting x2 (left internal mammary artery to LAD, saphenous vein graft to obtuse marginal 2), endoscopic vein harvest left thigh. Needle biopsy of right upper lobe nodule.    SURGEON:  Salvatore Decent. Dorris Fetch, MD.   ASSISTANT:  Isabella Stalling, MD, chief resident of [facility].   SECOND ASSISTANT:  Jillyn Hidden.   ANESTHESIA:  General.   FINDINGS:  Transesophageal echocardiography showed preserved left ventricular function with no significant valvular pathology.  Mild to moderate aortic stenosis, post-bypass unchanged.  Mammary and saphenous vein of good quality.  LAD and OM2 good  quality targets.  Frozen section of biopsy from lung nodule showed no malignancy, but a normal lung.  Discharge Exam: Blood pressure 117/68, pulse 88, temperature 98.1 F (36.7 C), temperature source Oral, resp. rate 18, weight 83.5 kg, SpO2 96 %. Cardiovascular: RRR Pulmonary: Clear bilaterally Abdomen: Soft, non tender, bowel sounds present. Extremities: Mild bilateral lower extremity edema. Wounds: Clean and dry.  No erythema or signs of infection.    Disposition:  Discharge disposition: 01-Home or Self Care        Allergies as of 05/28/2023   No Known Allergies      Medication List     STOP taking these medications    amLODipine 5 MG tablet Commonly known as: NORVASC   aspirin 81 MG tablet Replaced by: aspirin EC 325 MG tablet   isosorbide mononitrate 30 MG 24 hr tablet Commonly known as: IMDUR   nitroGLYCERIN 0.4 MG SL tablet Commonly known as: Nitrostat   ranolazine 500 MG 12 hr tablet Commonly known as: RANEXA       TAKE these medications    ALPRAZolam 0.5 MG tablet Commonly known as: XANAX Take 0.5 mg by mouth at bedtime. Take 0.5 mg in the morning and 1 mg at bedtime   amiodarone 200 MG tablet Commonly known as: PACERONE Take 200 mg two times daily for 10 days;then take 200 mg daily thereafter   aspirin EC 325 MG tablet Take 1 tablet (325 mg total) by mouth daily. Replaces: aspirin 81 MG tablet   atorvastatin 80 MG tablet Commonly known as: LIPITOR Take 1 tablet (80 mg total) by mouth daily.   CENTRUM ADULTS PO Take 1  tablet by mouth daily.   ferrous sulfate 325 (65 FE) MG EC tablet Take 1 tablet (325 mg total) by mouth daily with breakfast.   FISH OIL PO Take 1 capsule by mouth daily.   furosemide 20 MG tablet Commonly known as: LASIX Take 1 tablet (20 mg total) by mouth daily. For 5 days then stop   metoprolol tartrate 25 MG tablet Commonly known as: LOPRESSOR Take 1 tablet (25 mg total) by mouth 2 (two) times daily.   mirtazapine 7.5 MG tablet Commonly known as: REMERON TAKE 1 TABLET(7.5 MG) BY MOUTH AT BEDTIME What changed: See the new instructions.   potassium chloride 10 MEQ tablet Commonly known as: KLOR-CON M Take 1 tablet (10 mEq total) by mouth every other day.   sertraline 50 MG tablet Commonly known as: ZOLOFT TAKE 3 TABLETS(150 MG) BY MOUTH DAILY What changed: See the new instructions.   tamsulosin 0.4 MG Caps capsule Commonly known as: FLOMAX Take 0.4 mg by  mouth at bedtime.   traMADol 50 MG tablet Commonly known as: ULTRAM Take 1 tablet (50 mg total) by mouth every 12 (twelve) hours as needed for moderate pain.   UNABLE TO FIND Take 1 tablet by mouth daily. Bone supplement   ZINC PO Take by mouth.        Follow-up Information     [facility]. Go on 06/06/2023.   Specialty: Cardiothoracic Surgery Why: Your appointment for suture removal is at 10 AM. Contact information: [address] [place], [address] [place] [address] [phone]        [facility] [place]. Go on 06/12/2023.   Specialties: Cardiology, Radiology Why: Your appointment is at 2:20 PM Contact information: [address] [place] [address] [phone]         [facility]. Go on 06/20/2023.   Specialty: Cardiothoracic Surgery Why: Your appointment is at 1 PM. Contact information: [address] [place], [address] [place] [address] [phone]         [facility]. Go on 06/20/2023.   Why: Please arrive by 11:30 am on 07/02 to have PA and LAT CXR taken PRIOR to office appointment with[facility] Contact information: [address] [place] [address]        Barbie Banner, MD. Call.   Specialty: Family Medicine Why: for a follow up regarding new HGA1C 6.9 (new diabetes) Contact information: [address] [place] [address] [phone]         [facility] Follow up.   Why: HHPT/OT arranged ([facility] office referral preop) they will contact you to schedule Contact information: [address] [place] [address] [phone]                 The patient has been discharged on:   1.Beta Blocker:  Yes [ x  ]                              No   [   ]                              If No, reason:  2.Ace Inhibitor/ARB: Yes [   ]                                     No  [  x ]                                     If No, reason: CKD, labile BP  3.Statin:   Yes [ x  ]                  No  [   ]                  If No, reason:  4.Ecasa:  Yes  [ x ]                  No   [   ]                  If  No, reason:  5. ACS on Admission?Yes  P2Y12 Inhibitor:  Yes  [   ]                                No  [ x]. Patient has had thrombocytopenia post op (and advanced age). Will defer if should start as outpatient to cardiology.    Signed: Ardelle Balls, PA-C 05/28/2023, 1:16 PM

## 2023-05-22 NOTE — Progress Notes (Signed)
Patient ID: Gregory [name], male   DOB: 08/02/1934, 89 y.o.   MRN: [id number]  [facility] Evening Rounds:   Hemodynamically stable on epi 7, NE 7, and vaso 0.03 CI = 3  Still asleep on vent  Urine output good  CT output low  CBC    Component Value Date/Time   WBC 15.3 (H) 05/22/2023 1331   RBC 3.97 (L) 05/22/2023 1331   HGB 12.0 (L) 05/22/2023 1331   HCT 36.5 (L) 05/22/2023 1331   PLT 114 (L) 05/22/2023 1331   MCV 91.9 05/22/2023 1331   MCH 30.2 05/22/2023 1331   MCHC 32.9 05/22/2023 1331   RDW 14.0 05/22/2023 1331   LYMPHSABS 1.3 05/15/2023 1547   MONOABS 0.8 05/15/2023 1547   EOSABS 0.1 05/15/2023 1547   BASOSABS 0.0 05/15/2023 1547     BMET    Component Value Date/Time   NA 137 05/22/2023 1248   NA 142 01/02/2018 1456   K 4.2 05/22/2023 1248   CL 102 05/22/2023 1248   CO2 26 05/22/2023 0638   GLUCOSE 237 (H) 05/22/2023 1248   BUN 13 05/22/2023 1248   BUN 11 01/02/2018 1456   CREATININE 1.00 05/22/2023 1248   CALCIUM 9.2 05/22/2023 0638   GFRNONAA 49 (L) 05/22/2023 6213     A/P:  Stable postop course. Continue current plans

## 2023-05-22 NOTE — Transfer of Care (Signed)
Immediate Anesthesia Transfer of Care Note  Patient: Gregory [name]  Procedure(s) Performed: CORONARY ARTERY BYPASS GRAFTING (CABG) X 2 WITH LEFT INTERNAL MAMMARY ARTERY HARVEST AND GREATER SAPHENOUS VEIN HARVESTED ENDOSCOPICALLY (Chest) TRANSESOPHAGEAL ECHOCARDIOGRAM  Patient Location: SICU  Anesthesia Type:General  Level of Consciousness: Patient remains intubated per anesthesia plan  Airway & Oxygen Therapy: Patient remains intubated per anesthesia plan and Patient placed on Ventilator (see vital sign flow sheet for setting)  Post-op Assessment: Report given to RN and Post -op Vital signs reviewed and stable  Post vital signs: Reviewed and stable  Last Vitals:  Vitals Value Taken Time  BP 92/54   Temp    Pulse 80 05/22/23 1325  Resp 16 05/22/23 1325  SpO2 95 % 05/22/23 1325  Vitals shown include unvalidated device data.  Last Pain:  Vitals:   05/22/23 0605  TempSrc: Oral  PainSc:          Complications: No notable events documented.

## 2023-05-22 NOTE — Progress Notes (Signed)
Right chest tube removal site dressing saturated and leaking serosanguinous fluid. Dressings removed cleansed with betadine and redressed. Patient tolerated well and denies pain, discomfort or shortness of breath.

## 2023-05-22 NOTE — Interval H&P Note (Signed)
History and Physical Interval Note:  05/22/2023 7:09 AM  Gregory [name]  has presented today for surgery, with the diagnosis of CAD.  The various methods of treatment have been discussed with the patient and family. After consideration of risks, benefits and other options for treatment, the patient has consented to  Procedure(s): CORONARY ARTERY BYPASS GRAFTING (CABG) (N/A) TRANSESOPHAGEAL ECHOCARDIOGRAM (N/A) as a surgical intervention.  The patient's history has been reviewed, patient examined, no change in status, stable for surgery.  I have reviewed the patient's chart and labs.  Questions were answered to the patient's satisfaction.     [name]

## 2023-05-22 NOTE — Op Note (Signed)
NAME: Gregory [name], Gregory D. MEDICAL RECORD NO: [id number] ACCOUNT NO: [id number] DATE OF BIRTH: 1934-10-22 FACILITY: [facility] LOCATION: [facility] PHYSICIAN: Salvatore Decent. Dorris Fetch, MD  Operative Report   DATE OF PROCEDURE: 05/22/2023  PREOPERATIVE DIAGNOSES:  Severe 2-vessel coronary artery disease and right upper lobe lung nodule.  POSTOPERATIVE DIAGNOSES:  Severe 2-vessel coronary artery disease and right upper lobe lung nodule.  PROCEDURE PERFORMED:   Median sternotomy, extracorporeal circulation,  Coronary artery bypass grafting x2  Left internal mammary artery to LAD,  Saphenous vein graft to obtuse marginal 2, Endoscopic vein harvest left thigh. Needle biopsy of right upper lobe nodule.  SURGEON:  Salvatore Decent. Dorris Fetch, MD.  ASSISTANT:  Isabella Stalling, MD, chief resident, [facility].  SECOND ASSISTANT:  Jillyn Hidden, PA.  ANESTHESIA:  General.  FINDINGS:  Transesophageal echocardiography showed preserved left ventricular function with no significant valvular pathology.  Mild to moderate aortic stenosis, post-bypass unchanged.  Mammary and saphenous vein of good quality.  LAD and OM2 good quality targets.  Frozen section of biopsy from lung nodule showed no malignancy, but normal lung.  CLINICAL NOTE:  Mr. Gregory [name] is an 89 year old man who was being evaluated for a lung nodule.  He had known coronary artery disease.  He presented with recurrent angina and at catheterization, he had a severe calcification of his LAD and circumflex with significant stenoses in the LAD and prior to obtuse marginal 2.  These were not amenable to percutaneous intervention.  He was offered the option of coronary artery bypass grafting.  The indications, risks, benefits, and alternatives were discussed in  detail with the patient.  He understood and accepted the risks and agreed to proceed.  OPERATIVE NOTE:  Mr. Gregory [name] was brought to the preoperative holding area on 05/22/2023.  Anesthesia placed a  Swan-Ganz catheter and an arterial blood pressure monitoring line and he was taken to the operating room and anesthetized and intubated.  A Foley  catheter was placed.  Intravenous antibiotics were administered.  The chest, abdomen and legs were prepped and draped in the usual sterile fashion.  Dr. Renold Don performed transesophageal echocardiography.  Please see his separately dictated note for full details of the procedure.  A timeout was performed.  A median sternotomy was performed and the left internal mammary artery was harvested using standard technique under direct vision.  Simultaneously, incision was made in the medial aspect of the left leg at the level of the knee.  The greater saphenous vein was harvested endoscopically from the left thigh.  2000 units of heparin was administered during the vessel harvest.  The remainder of the full heparin dose was given prior to opening the pericardium.  After harvesting the conduits, the pericardium was opened.  The ascending aorta was inspected.  It was relatively large, around 3.5-4 cm, but was relatively free of atherosclerotic disease in the ascending portion of the aorta.  After confirming adequate anticoagulation with ACT measurement, the aorta was cannulated via concentric 2-0 Ethibond pledgeted pursestring sutures.  A dual stage venous cannula was placed via a pursestring suture in right atrial appendage.  Cardiopulmonary bypass was initiated.  Flows were maintained per protocol.  The patient was cooled to 32 degrees Celsius.  The coronary arteries were inspected and anastomotic sites were chosen.  The conduits were inspected and cut to length.  A foam pad was placed in the pericardium to insulate the heart.  A temperature probe was placed in the myocardial septum and a cardioplegia cannula was placed in the ascending aorta.  The aorta was crossclamped.  The left ventricle was emptied via the aortic root vent.  Cardiac arrest then was achieved with a  combination of cold antegrade blood cardioplegia and topical ice saline.  One liter of cardioplegia was administered.  There was rapid diastolic arrest and there was septal cooling to 13 degrees Celsius.    A reversed saphenous vein graft was placed end-to-side to the obtuse marginal 2.  This vessel had an 80% stenosis.  There was a 1.5 mm good quality vessel.  The end-to-side  anastomosis was performed with a running 7-0 Prolene suture.  A probe passed easily proximally and distally.  At the completion of the anastomosis, cardioplegia was administered and there was good flow and good hemostasis.  Additional cardioplegia was administered.  The left internal mammary artery was brought through a window in the pericardium.  The distal end was bevelled.  It was anastomosed end-to-side to the LAD.  The mammary and LAD were both 1.5 mm good quality vessels.  The end-to-side anastomosis was performed with a running 8-0 Prolene suture.  At the completion of the anastomosis, the bulldog clamp was briefly removed to inspect for hemostasis, septal rewarming was noted.  The bulldog clamp was replaced and the mammary pedicle was tacked to the epicardial surface of the heart with 6-0 Prolene sutures.  Additional cardioplegia was administered.  The vein graft was cut to length.  The cardioplegic cannula was removed from the ascending aorta.  The proximal vein graft anastomosis was performed to a 4.5 mm punch aortotomy with a running 6-0 Prolene suture.    At the completion of the proximal anastomosis, the patient was placed in Trendelenburg position.  Lidocaine was administered.  The aortic root was de-aired and the bulldog clamp was removed from the left mammary artery.  The aortic crossclamp was removed.  Total crossclamp time was 46 minutes.  The patient spontaneously resumed sinus rhythm and did not require defibrillation.  While rewarming was completed, all proximal and distal anastomoses were inspected for  hemostasis.  Epicardial pacing wires were placed on the right ventricle and atrium.  The right pleura was opened and the right upper lobe nodule identified.  Tru-Cut needle biopsies were performed on the nodule, one was sent for frozen section, the others were sent for permanent pathology.  The biopsy site was oversewn with a 3-0 Vicryl suture.  When the patient had rewarmed to a core temperature of 37 degrees Celsius, he was weaned from cardiopulmonary bypass on the first attempt.  Total bypass time was 91 minutes.  Of note, during the bypass run and post-bypass, the patient had a systemic vasodilatation and required multiple pressor agents to maintain vascular tone.  Post-bypass, this persisted and he was on Neo-Synephrine, Levophed and epinephrine infusions, but with preserved left ventricular systolic function and relatively low filling pressures.  He also had good cardiac indices and this hemodynamic pattern persisted throughout the post-bypass period.  A test dose of protamine was administered and was well tolerated.  The atrial and aortic cannulae were removed.  The remainder of the protamine was administered without incident.  The initial cardiac index was greater than 2 liters per minute per meter squared and his cardiac index remained elevated throughout the post-bypass period.  The chest was irrigated with warm saline.  Hemostasis was achieved.  Bilateral pleural tubes and a single anterior mediastinal tube were placed through separate subcostal incisions.  Sternum was closed with a combination of single and double heavy gauge stainless  steel wires.  Pectoralis fascia, subcutaneous tissue and skin were  closed in standard fashion.  All sponge, needle and instrument counts were correct at the end of the procedure.  Experienced assistance was necessary for this case due to surgical complexity.  Jillyn Hidden independently harvested the saphenous vein and closed the leg incisions.  Dr. Margit Banda  assisted with chest opening and closure, internal mammary artery harvest, going on and off bypass, and provided suture management, suctioning, exposure and retraction of delicate tissues during the anastomoses.   CHR D: 05/22/2023 6:25:59 pm T: 05/22/2023 9:05:00 pm  JOB: [id number]/ [id number]

## 2023-05-22 NOTE — Brief Op Note (Addendum)
05/22/2023  9:41 AM  PATIENT:  Gregory [name]  89 y.o. male  PRE-OPERATIVE DIAGNOSIS:  Coronary Artery Disease, Right Upper Lobe Pulmonary Nodule  POST-OPERATIVE DIAGNOSIS:  Coronary Artery Disease, Right Upper Lobe Pulmonary Nodule  PROCEDURES:   --CORONARY ARTERY BYPASS GRAFTING (CABG) X 2 WITH LEFT INTERNAL MAMMARY ARTERY HARVEST AND LEFT GREATER SAPHENOUS VEIN HARVESTED ENDOSCOPICALLY   Vein harvest time: Vein prep time:  --TRANSESOPHAGEAL ECHOCARDIOGRAM   --NEEDLE BIOPSY NODULE RIGHT UPPER LOBE  SURGEON:  [name], MD - Primary  PHYSICIAN ASSISTANT: Jillyn Hidden, PA  ASSISTANTS: Isabella Stalling, MD- Chief resident [facility]  ANESTHESIA:   general  EBL:    BLOOD ADMINISTERED:none  DRAINS:  Left pleural and mediastinal Blake drains    LOCAL MEDICATIONS USED:  NONE  SPECIMEN: RUL lung nodule  DISPOSITION OF SPECIMEN:  PATHOLOGY  COUNTS:  Correct  DICTATION: done  PLAN OF CARE: Admit to inpatient   PATIENT DISPOSITION:  ICU - intubated and hemodynamically stable.   Delay start of Pharmacological VTE agent (>24hrs) due to surgical blood loss or risk of bleeding: yes

## 2023-05-22 NOTE — Anesthesia Procedure Notes (Addendum)
Procedure Name: Intubation Date/Time: 05/22/2023 7:41 AM  Performed by: Rachel Moulds, CRNAPre-anesthesia Checklist: Patient identified, Emergency Drugs available, Suction available, Patient being monitored and Timeout performed Patient Re-evaluated:Patient Re-evaluated prior to induction Oxygen Delivery Method: Circle system utilized Preoxygenation: Pre-oxygenation with 100% oxygen Induction Type: IV induction Ventilation: Mask ventilation without difficulty Laryngoscope Size: Mac and 4 Grade View: Grade I Tube type: Oral Tube size: 8.0 mm Airway Equipment and Method: Stylet Placement Confirmation: ETT inserted through vocal cords under direct vision, positive ETCO2, breath sounds checked- equal and bilateral and CO2 detector Secured at: 23 cm Tube secured with: Tape Dental Injury: Teeth and Oropharynx as per pre-operative assessment

## 2023-05-22 NOTE — Anesthesia Procedure Notes (Addendum)
Central Venous Catheter Insertion Performed by: Lewie Loron, MD, anesthesiologist Start/End6/02/2023 6:50 AM, 05/22/2023 7:05 AM Patient location: Pre-op. Preanesthetic checklist: patient identified, IV checked, site marked, risks and benefits discussed, surgical consent, monitors and equipment checked, pre-op evaluation, timeout performed and anesthesia consent Position: Trendelenburg Lidocaine 1% used for infiltration and patient sedated Hand hygiene performed  and maximum sterile barriers used  Catheter size: 8.5 Fr Sheath introducer Procedure performed using ultrasound guided technique. Ultrasound Notes:anatomy identified, needle tip was noted to be adjacent to the nerve/plexus identified, no ultrasound evidence of intravascular and/or intraneural injection and image(s) printed for medical record Attempts: 1 Following insertion, line sutured, dressing applied and Biopatch. Post procedure assessment: blood return through all ports, free fluid flow and no air  Patient tolerated the procedure well with no immediate complications.

## 2023-05-22 NOTE — Anesthesia Procedure Notes (Addendum)
Arterial Line Insertion Start/End6/02/2023 7:00 AM, 05/22/2023 7:05 AM Performed by: Rachel Moulds, CRNA, CRNA  Patient location: Pre-op. Preanesthetic checklist: patient identified, IV checked, site marked, risks and benefits discussed, surgical consent, monitors and equipment checked, pre-op evaluation, timeout performed and anesthesia consent Lidocaine 1% used for infiltration and patient sedated Left, radial was placed Catheter size: 20 G Hand hygiene performed  and maximum sterile barriers used  Allen's test indicative of satisfactory collateral circulation Attempts: 2 Procedure performed without using ultrasound guided technique. Following insertion, Biopatch. Post procedure assessment: normal  Patient tolerated the procedure well with no immediate complications.

## 2023-05-23 ENCOUNTER — Encounter (HOSPITAL_COMMUNITY): Payer: Self-pay | Admitting: Thoracic Surgery (Cardiothoracic Vascular Surgery)

## 2023-05-23 ENCOUNTER — Inpatient Hospital Stay (HOSPITAL_COMMUNITY): Payer: PPO

## 2023-05-23 LAB — BASIC METABOLIC PANEL
Anion gap: 6 (ref 5–15)
Anion gap: 7 (ref 5–15)
BUN: 15 mg/dL (ref 8–23)
BUN: 21 mg/dL (ref 8–23)
CO2: 25 mmol/L (ref 22–32)
CO2: 27 mmol/L (ref 22–32)
Calcium: 8.5 mg/dL — ABNORMAL LOW (ref 8.9–10.3)
Calcium: 8.8 mg/dL — ABNORMAL LOW (ref 8.9–10.3)
Chloride: 104 mmol/L (ref 98–111)
Chloride: 97 mmol/L — ABNORMAL LOW (ref 98–111)
Creatinine, Ser: 1.32 mg/dL — ABNORMAL HIGH (ref 0.61–1.24)
Creatinine, Ser: 1.63 mg/dL — ABNORMAL HIGH (ref 0.61–1.24)
GFR, Estimated: 53 mL/min — ABNORMAL LOW (ref >60)
GFR, Estimated: 41 mL/min — ABNORMAL LOW (ref >60)
Glucose, Bld: 97 mg/dL (ref 70–99)
Glucose, Bld: 149 mg/dL — ABNORMAL HIGH (ref 70–99)
Potassium: 5.1 mmol/L (ref 3.5–5.1)
Potassium: 5.2 mmol/L — ABNORMAL HIGH (ref 3.5–5.1)
Sodium: 135 mmol/L (ref 135–145)
Sodium: 131 mmol/L — ABNORMAL LOW (ref 135–145)

## 2023-05-23 LAB — GLUCOSE, CAPILLARY
Glucose-Capillary: 140 mg/dL — ABNORMAL HIGH (ref 70–99)
Glucose-Capillary: 106 mg/dL — ABNORMAL HIGH (ref 70–99)
Glucose-Capillary: 107 mg/dL — ABNORMAL HIGH (ref 70–99)
Glucose-Capillary: 119 mg/dL — ABNORMAL HIGH (ref 70–99)
Glucose-Capillary: 84 mg/dL (ref 70–99)
Glucose-Capillary: 90 mg/dL (ref 70–99)
Glucose-Capillary: 160 mg/dL — ABNORMAL HIGH (ref 70–99)
Glucose-Capillary: 115 mg/dL — ABNORMAL HIGH (ref 70–99)
Glucose-Capillary: 119 mg/dL — ABNORMAL HIGH (ref 70–99)
Glucose-Capillary: 76 mg/dL (ref 70–99)

## 2023-05-23 LAB — CBC
HCT: 29.1 % — ABNORMAL LOW (ref 39.0–52.0)
HCT: 29.9 % — ABNORMAL LOW (ref 39.0–52.0)
Hemoglobin: 9.9 g/dL — ABNORMAL LOW (ref 13.0–17.0)
Hemoglobin: 9.9 g/dL — ABNORMAL LOW (ref 13.0–17.0)
MCH: 30.3 pg (ref 26.0–34.0)
MCH: 30.3 pg (ref 26.0–34.0)
MCHC: 34 g/dL (ref 30.0–36.0)
MCHC: 33.1 g/dL (ref 30.0–36.0)
MCV: 89 fL (ref 80.0–100.0)
MCV: 91.4 fL (ref 80.0–100.0)
Platelets: 80 10*3/uL — ABNORMAL LOW (ref 150–400)
Platelets: 82 10*3/uL — ABNORMAL LOW (ref 150–400)
RBC: 3.27 MIL/uL — ABNORMAL LOW (ref 4.22–5.81)
RBC: 3.27 MIL/uL — ABNORMAL LOW (ref 4.22–5.81)
RDW: 14.6 % (ref 11.5–15.5)
RDW: 14.9 % (ref 11.5–15.5)
WBC: 9.2 10*3/uL (ref 4.0–10.5)
WBC: 14.3 10*3/uL — ABNORMAL HIGH (ref 4.0–10.5)
nRBC: 0 % (ref 0.0–0.2)
nRBC: 0 % (ref 0.0–0.2)

## 2023-05-23 LAB — POCT I-STAT 7, (LYTES, BLD GAS, ICA,H+H)
Acid-Base Excess: 0 mmol/L (ref 0.0–2.0)
Bicarbonate: 25.2 mmol/L (ref 20.0–28.0)
Calcium, Ion: 1.21 mmol/L (ref 1.15–1.40)
HCT: 29 % — ABNORMAL LOW (ref 39.0–52.0)
Hemoglobin: 9.9 g/dL — ABNORMAL LOW (ref 13.0–17.0)
O2 Saturation: 98 %
Patient temperature: 37.6
Potassium: 4.1 mmol/L (ref 3.5–5.1)
Sodium: 141 mmol/L (ref 135–145)
TCO2: 26 mmol/L (ref 22–32)
pCO2 arterial: 44.4 mmHg (ref 32–48)
pH, Arterial: 7.364 (ref 7.35–7.45)
pO2, Arterial: 112 mmHg — ABNORMAL HIGH (ref 83–108)

## 2023-05-23 LAB — MAGNESIUM
Magnesium: 2.1 mg/dL (ref 1.7–2.4)
Magnesium: 2.1 mg/dL (ref 1.7–2.4)

## 2023-05-23 LAB — BPAM RBC
Blood Product Expiration Date: 202407012359
Blood Product Expiration Date: 202407012359
ISSUE DATE / TIME: 202406031209

## 2023-05-23 LAB — TYPE AND SCREEN
Unit division: 0
Unit division: 0

## 2023-05-23 MED ORDER — BENZONATATE 100 MG PO CAPS: 200.0000 mg | ORAL_CAPSULE | Freq: Three times a day (TID) | ORAL | Status: DC | PRN

## 2023-05-23 MED ORDER — INSULIN ASPART 100 UNIT/ML IJ SOLN
0.0000 [IU] | INTRAMUSCULAR | Status: DC
  Administered 2023-05-23 – 2023-05-24 (×5): 2 [IU] via SUBCUTANEOUS
  Administered 2023-05-24 (×2): 4 [IU] via SUBCUTANEOUS
  Administered 2023-05-24: 2 [IU] via SUBCUTANEOUS
  Administered 2023-05-25: 4 [IU] via SUBCUTANEOUS
  Administered 2023-05-25: 2 [IU] via SUBCUTANEOUS
  Administered 2023-05-25: 4 [IU] via SUBCUTANEOUS
  Administered 2023-05-25 – 2023-05-26 (×3): 2 [IU] via SUBCUTANEOUS

## 2023-05-23 MED ORDER — GUAIFENESIN ER 600 MG PO TB12
600.0000 mg | ORAL_TABLET | Freq: Two times a day (BID) | ORAL | Status: AC
  Administered 2023-05-23 – 2023-05-27 (×10): 600 mg via ORAL
  Filled 2023-05-23 (×10): qty 1

## 2023-05-23 MED ORDER — FUROSEMIDE 10 MG/ML IJ SOLN
20.0000 mg | Freq: Two times a day (BID) | INTRAMUSCULAR | Status: AC
  Administered 2023-05-23 (×2): 20 mg via INTRAVENOUS
  Filled 2023-05-23 (×2): qty 2

## 2023-05-23 NOTE — Progress Notes (Signed)
Extubation Procedure Note  Patient Details:   Name: Gregory [name] DOB: 1934-08-18 MRN: [id number]   Airway Documentation:    Vent end date: 05/23/23 Vent end time: 0228   Evaluation  O2 sats: stable throughout Complications: No apparent complications Patient did tolerate procedure well. Bilateral Breath Sounds: Clear, Diminished   Yes  [name] 05/23/2023, 6:22 AM

## 2023-05-23 NOTE — TOC Initial Note (Signed)
Transition of Care [facility]) - Initial/Assessment Note    Patient Details  Name: Gregory [name] MRN: [id number] Date of Birth: May 03, 1934  Transition of Care [facility]) CM/SW Contact:    Gala Lewandowsky, RN Phone Number: 05/23/2023, 2:30 PM  Clinical Narrative: Risk for readmission assessment completed. Patient presented for chest pain last week and is now presenting for CABG-POD-1. PTA patient was independent from home with spouse and son. Patient has DME RW; however, he does not use the equipment. Case Manager will continue to follow for additional transition of care needs as the patient progresses.                                         Expected Discharge Plan: Home/Self Care Barriers to Discharge: Continued Medical Work up   Patient Goals and CMS Choice Patient states their goals for this hospitalization and ongoing recovery are:: to return home.   Choice offered to / list presented to : NA      Expected Discharge Plan and Services In-house Referral: NA Discharge Planning Services: CM Consult Post Acute Care Choice: NA Living arrangements for the past 2 months: Single Family Home                   DME Agency: NA    Prior Living Arrangements/Services Living arrangements for the past 2 months: Single Family Home Lives with:: Adult Children, Spouse          Need for Family Participation in Patient Care: Yes (Comment) Care giver support system in place?: Yes (comment) Current home services: DME (rolling walker) Criminal Activity/Legal Involvement Pertinent to Current Situation/Hospitalization: No - Comment as needed  Activities of Daily Living Home Assistive Devices/Equipment: Eyeglasses, Blood pressure cuff, Hearing aid ADL Screening (condition at time of admission) Patient's cognitive ability adequate to safely complete daily activities?: Yes Is the patient deaf or have difficulty hearing?: Yes Does the patient have difficulty seeing, even when wearing  glasses/contacts?: No Does the patient have difficulty concentrating, remembering, or making decisions?: No Patient able to express need for assistance with ADLs?: Yes Does the patient have difficulty dressing or bathing?: No Independently performs ADLs?: Yes (appropriate for developmental age) Does the patient have difficulty walking or climbing stairs?: Yes Weakness of Legs: None Weakness of Arms/Hands: None  Permission Sought/Granted Permission sought to share information with : Case Manager, Family Supports    Emotional Assessment Appearance:: Appears stated age       Alcohol / Substance Use: Not Applicable Psych Involvement: No (comment)  Admission diagnosis:  S/P CABG x 2 [Z95.1] Patient Active Problem List   Diagnosis Date Noted   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   Hyperlipidemia LDL goal <70 02/28/2023   Stage 3a chronic kidney disease (CKD) (HCC) 12/30/2022   Hyperglycemia 12/30/2022   Lung nodule 12/30/2022   Coronary artery disease involving native coronary artery of native heart with unstable angina pectoris (HCC) 12/30/2022   AAA (abdominal aortic aneurysm) without rupture (HCC) 12/30/2022   Carotid artery disease (HCC) 12/30/2022   Non-ST elevation (NSTEMI) myocardial infarction (HCC) 12/29/2022   AKI (acute kidney injury) (HCC) 12/25/2022   Primary hypertension 12/25/2022   History of CVA (cerebrovascular accident) 12/25/2022   Angina pectoris (HCC) 12/24/2022   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 01/02/2018   Tremor 06/14/2017   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/17/2013   PCP:  Barbie Banner, MD  Pharmacy:   [facility] [phone] - [place], [place] - [address] [place] [address] Phone: [phone] Fax: [phone]  [id number] - [place], [place] - [address] [facility] [address] [place] [address] Phone: [phone] Fax: [phone]  [facility] [address] [place] [address] Phone: [phone] Fax: [phone]   Social Determinants of Health (SDOH) Social History: SDOH Screenings   Food Insecurity: No Food Insecurity (05/15/2023)  Housing: Low Risk  (05/15/2023)  Transportation Needs: No Transportation Needs (05/15/2023)  Utilities: Not At Risk (05/15/2023)  Tobacco Use: Medium Risk (05/23/2023)   Readmission Risk Interventions    05/23/2023    2:30 PM  Readmission Risk Prevention Plan  Transportation Screening Complete  HRI or Home Care Consult Complete  Social Work Consult for Recovery Care Planning/Counseling Complete  Palliative Care Screening Not Applicable  Medication Review Oceanographer) Referral to Pharmacy

## 2023-05-23 NOTE — Anesthesia Postprocedure Evaluation (Signed)
Anesthesia Post Note  Patient: CARTAVIOUS WILLMANN  Procedure(s) Performed: CORONARY ARTERY BYPASS GRAFTING (CABG) X 2 WITH LEFT INTERNAL MAMMARY ARTERY HARVEST AND GREATER SAPHENOUS VEIN HARVESTED ENDOSCOPICALLY (Chest) TRANSESOPHAGEAL ECHOCARDIOGRAM     Patient location during evaluation: PACU Anesthesia Type: General Level of consciousness: sedated and patient remains intubated per anesthesia plan Pain management: pain level controlled Vital Signs Assessment: post-procedure vital signs reviewed and stable Respiratory status: patient on ventilator - see flowsheet for VS and patient remains intubated per anesthesia plan Cardiovascular status: stable Anesthetic complications: no   No notable events documented.  Last Vitals:  Vitals:   05/23/23 0230 05/23/23 0245  BP:    Pulse: 89 81  Resp: 17 12  Temp: 37.8 C 37.8 C  SpO2: 94% 96%    Last Pain:  Vitals:   05/23/23 0000  TempSrc: Core  PainSc:                  Lewie Loron

## 2023-05-23 NOTE — Progress Notes (Signed)
NIF -40 

## 2023-05-23 NOTE — Progress Notes (Signed)
1 Day Post-Op Procedure(s) (LRB): CORONARY ARTERY BYPASS GRAFTING (CABG) X 2 WITH LEFT INTERNAL MAMMARY ARTERY HARVEST AND GREATER SAPHENOUS VEIN HARVESTED ENDOSCOPICALLY (N/A) TRANSESOPHAGEAL ECHOCARDIOGRAM (N/A) Subjective: Cough with some blood noted. Mild incisional pain Extubated around 2 AM  Objective: Vital signs in last 24 hours: Temp:  [95.9 F (35.5 C)-100 F (37.8 C)] 100 F (37.8 C) (06/04 0245) Pulse Rate:  [72-100] 81 (06/04 0245) Cardiac Rhythm: Normal sinus rhythm (06/03 2000) Resp:  [10-27] 12 (06/04 0245) BP: (111)/(60) 111/60 (06/03 1326) SpO2:  [94 %-100 %] 96 % (06/04 0245) Arterial Line BP: (79-137)/(41-63) 125/63 (06/04 0245) FiO2 (%):  [40 %-60 %] 40 % (06/03 2158)  Hemodynamic parameters for last 24 hours: PAP: (20-36)/(5-18) 25/15 CO:  [5.9 L/min-7.4 L/min] 7.4 L/min CI:  [2.9 L/min/m2-3.6 L/min/m2] 3.6 L/min/m2  Intake/Output from previous day: 06/03 0701 - 06/04 0700 In: 8662.7 [I.V.:4495.9; ZOXWR:6045; IV Piggyback:2011.8] Out: 3284 [Urine:2247; Blood:523; Chest Tube:514] Intake/Output this shift: No intake/output data recorded.  General appearance: alert, cooperative, and no distress Neurologic: intact Heart: regular rate and rhythm Lungs: diminished breath sounds bibasilar Abdomen: normal findings: soft, non-tender  Lab Results: Recent Labs    05/22/23 2003 05/22/23 2157 05/23/23 0006 05/23/23 0541  WBC 13.9*  --   --  9.2  HGB 11.4*   < > 9.9* 9.9*  HCT 34.5*   < > 29.0* 29.1*  PLT 118*  --   --  80*   < > = values in this interval not displayed.   BMET:  Recent Labs    05/22/23 2003 05/22/23 2157 05/23/23 0006 05/23/23 0541  NA 135   < > 141 135  K 3.4*   < > 4.1 5.1  CL 104  --   --  104  CO2 20*  --   --  25  GLUCOSE 239*  --   --  97  BUN 12  --   --  15  CREATININE 1.45*  --   --  1.32*  CALCIUM 8.8*  --   --  8.5*   < > = values in this interval not displayed.    PT/INR:  Recent Labs    05/22/23 1331   LABPROT 18.6*  INR 1.5*   ABG    Component Value Date/Time   PHART 7.364 05/23/2023 0006   HCO3 25.2 05/23/2023 0006   TCO2 26 05/23/2023 0006   ACIDBASEDEF 9.0 (H) 05/22/2023 2157   O2SAT 98 05/23/2023 0006   CBG (last 3)  Recent Labs    05/23/23 0337 05/23/23 0545 05/23/23 0701  GLUCAP 119* 106* 84    Assessment/Plan: S/P Procedure(s) (LRB): CORONARY ARTERY BYPASS GRAFTING (CABG) X 2 WITH LEFT INTERNAL MAMMARY ARTERY HARVEST AND GREATER SAPHENOUS VEIN HARVESTED ENDOSCOPICALLY (N/A) TRANSESOPHAGEAL ECHOCARDIOGRAM (N/A) POD # 1, Looks good NEURO- intact CV- in SR, weaned off drips with good hemodynamics  Dc Swan and A line  Metoprolol, statin, beta blocker RESP_ RUL atelectasis improved, still some opacity of RUL- Likely some intraalveolar hemorrhage from biopsy  IS, flutter, mucinex, tessalon PRN RENAL- creatinine mildly elevated  Diurese for volume overload  Monitor creatinine and lytes, keep Foley ENDO- CBG normal- insulin drip off  CBG q4, SSI PRN GI- tolerated diet this AM Anemia secondary to ABL- Hgb 9.9, monitor Thrombocytopenia- PLT 80K- no enoxaparin, monitor Dc chest tubes SCD for DVT prophylaxis Mobilize   LOS: 1 day    [name] 05/23/2023

## 2023-05-23 NOTE — Progress Notes (Signed)
     [address]       [place] [address]             [phone]       EVENING ROUNDS  POD #1 SP CABx2 Off all drips Requiring significant oxygen support Diuresing

## 2023-05-24 ENCOUNTER — Inpatient Hospital Stay (HOSPITAL_COMMUNITY): Payer: PPO

## 2023-05-24 LAB — GLUCOSE, CAPILLARY
Glucose-Capillary: 110 mg/dL — ABNORMAL HIGH (ref 70–99)
Glucose-Capillary: 147 mg/dL — ABNORMAL HIGH (ref 70–99)
Glucose-Capillary: 150 mg/dL — ABNORMAL HIGH (ref 70–99)
Glucose-Capillary: 139 mg/dL — ABNORMAL HIGH (ref 70–99)
Glucose-Capillary: 157 mg/dL — ABNORMAL HIGH (ref 70–99)
Glucose-Capillary: 164 mg/dL — ABNORMAL HIGH (ref 70–99)
Glucose-Capillary: 189 mg/dL — ABNORMAL HIGH (ref 70–99)
Glucose-Capillary: 141 mg/dL — ABNORMAL HIGH (ref 70–99)
Glucose-Capillary: 137 mg/dL — ABNORMAL HIGH (ref 70–99)

## 2023-05-24 LAB — CBC
HCT: 27 % — ABNORMAL LOW (ref 39.0–52.0)
Hemoglobin: 8.9 g/dL — ABNORMAL LOW (ref 13.0–17.0)
MCH: 29.9 pg (ref 26.0–34.0)
MCHC: 33 g/dL (ref 30.0–36.0)
MCV: 90.6 fL (ref 80.0–100.0)
Platelets: 73 10*3/uL — ABNORMAL LOW (ref 150–400)
RBC: 2.98 MIL/uL — ABNORMAL LOW (ref 4.22–5.81)
RDW: 15 % (ref 11.5–15.5)
WBC: 8.8 10*3/uL (ref 4.0–10.5)
nRBC: 0 % (ref 0.0–0.2)

## 2023-05-24 LAB — BASIC METABOLIC PANEL
Anion gap: 8 (ref 5–15)
BUN: 26 mg/dL — ABNORMAL HIGH (ref 8–23)
CO2: 27 mmol/L (ref 22–32)
Calcium: 8.3 mg/dL — ABNORMAL LOW (ref 8.9–10.3)
Chloride: 95 mmol/L — ABNORMAL LOW (ref 98–111)
Creatinine, Ser: 1.54 mg/dL — ABNORMAL HIGH (ref 0.61–1.24)
GFR, Estimated: 44 mL/min — ABNORMAL LOW (ref >60)
Glucose, Bld: 163 mg/dL — ABNORMAL HIGH (ref 70–99)
Potassium: 4.5 mmol/L (ref 3.5–5.1)
Sodium: 130 mmol/L — ABNORMAL LOW (ref 135–145)

## 2023-05-24 LAB — SURGICAL PATHOLOGY

## 2023-05-24 MED ORDER — FUROSEMIDE 10 MG/ML IJ SOLN
20.0000 mg | Freq: Two times a day (BID) | INTRAMUSCULAR | Status: AC
  Administered 2023-05-24 (×2): 20 mg via INTRAVENOUS
  Filled 2023-05-24 (×2): qty 2

## 2023-05-24 MED FILL — Magnesium Sulfate Inj 50%: INTRAMUSCULAR | Qty: 10 | Status: AC

## 2023-05-24 MED FILL — Heparin Sodium (Porcine) Inj 1000 Unit/ML: Qty: 1000 | Status: AC

## 2023-05-24 MED FILL — Potassium Chloride Inj 2 mEq/ML: INTRAVENOUS | Qty: 40 | Status: AC

## 2023-05-24 NOTE — Progress Notes (Signed)
Radiology called pt has small right apical pneumothorax. Dr Dorris Fetch notified.

## 2023-05-24 NOTE — Progress Notes (Signed)
EVENING ROUNDS NOTE :     [address]       [place] [address]             [phone]                 2 Days Post-Op Procedure(s) (LRB): CORONARY ARTERY BYPASS GRAFTING (CABG) X 2 WITH LEFT INTERNAL MAMMARY ARTERY HARVEST AND GREATER SAPHENOUS VEIN HARVESTED ENDOSCOPICALLY (N/A) TRANSESOPHAGEAL ECHOCARDIOGRAM (N/A)   Total Length of Stay:  LOS: 2 days  Events:   No events Up to chair    BP 117/61 (BP Location: Right Arm) Comment: 117/61  Pulse 73   Temp 99.3 F (37.4 C) (Oral)   Resp 17   Wt 88.2 kg   SpO2 98%   BMI 26.37 kg/m          sodium chloride Stopped (05/23/23 1531)   sodium chloride     sodium chloride Stopped (05/23/23 0755)   dexmedetomidine (PRECEDEX) IV infusion Stopped (05/22/23 1901)   epinephrine Stopped (05/22/23 2317)   lactated ringers     lactated ringers Stopped (05/22/23 1542)   lactated ringers Stopped (05/23/23 0755)   nitroGLYCERIN     norepinephrine (LEVOPHED) Adult infusion Stopped (05/23/23 0755)   phenylephrine (NEO-SYNEPHRINE) Adult infusion 0 mcg/min (05/22/23 1316)   vasopressin Stopped (05/23/23 0755)    I/O last 3 completed shifts: In: 3114 [I.V.:1866.7; IV Piggyback:1247.2] Out: 9147 [WGNFA:2130; Chest Tube:550]      Latest Ref Rng & Units 05/24/2023    4:57 AM 05/23/2023    5:05 PM 05/23/2023    5:41 AM  CBC  WBC 4.0 - 10.5 K/uL 8.8  14.3  9.2   Hemoglobin 13.0 - 17.0 g/dL 8.9  9.9  9.9   Hematocrit 39.0 - 52.0 % 27.0  29.9  29.1   Platelets 150 - 400 K/uL 73  82  80        Latest Ref Rng & Units 05/24/2023    4:57 AM 05/23/2023    5:05 PM 05/23/2023    5:41 AM  BMP  Glucose 70 - 99 mg/dL 865  784  97   BUN 8 - 23 mg/dL 26  21  15    Creatinine 0.61 - 1.24 mg/dL 6.96  2.95  2.84   Sodium 135 - 145 mmol/L 130  131  135   Potassium 3.5 - 5.1 mmol/L 4.5  5.2  5.1   Chloride 98 - 111 mmol/L 95  97  104   CO2 22 - 32 mmol/L 27  27  25    Calcium 8.9 - 10.3 mg/dL 8.3  8.8  8.5     ABG    Component  Value Date/Time   PHART 7.364 05/23/2023 0006   PCO2ART 44.4 05/23/2023 0006   PO2ART 112 (H) 05/23/2023 0006   HCO3 25.2 05/23/2023 0006   TCO2 26 05/23/2023 0006   ACIDBASEDEF 9.0 (H) 05/22/2023 2157   O2SAT 98 05/23/2023 0006       Brynda Greathouse, MD 05/24/2023 4:55 PM

## 2023-05-24 NOTE — Progress Notes (Addendum)
TCTS DAILY ICU PROGRESS NOTE                   [address]            [place] [address]          [phone]   2 Days Post-Op Procedure(s) (LRB): CORONARY ARTERY BYPASS GRAFTING (CABG) X 2 WITH LEFT INTERNAL MAMMARY ARTERY HARVEST AND GREATER SAPHENOUS VEIN HARVESTED ENDOSCOPICALLY (N/A) TRANSESOPHAGEAL ECHOCARDIOGRAM (N/A)  Total Length of Stay:  LOS: 2 days   Subjective: Up in the bedside chair, calm and cooperative but not fully oriented.   Objective: Vital signs in last 24 hours: Temp:  [98.5 F (36.9 C)-98.7 F (37.1 C)] 98.7 F (37.1 C) (06/05 0000) Pulse Rate:  [73-98] 75 (06/05 0600) Cardiac Rhythm: Normal sinus rhythm (06/04 2000) Resp:  [11-28] 15 (06/05 0600) BP: (102-148)/(58-122) 117/64 (06/05 0600) SpO2:  [81 %-99 %] 97 % (06/05 0600) Arterial Line BP: (103-159)/(46-73) 159/73 (06/04 1300) Weight:  [88.2 kg] 88.2 kg (06/05 0500)  Filed Weights   05/24/23 0500  Weight: 88.2 kg    Weight change:    Intake/Output from previous day: 06/04 0701 - 06/05 0700 In: 1432.6 [I.V.:872.8; IV Piggyback:559.9] Out: 4565 [Urine:4345; Chest Tube:220]  Intake/Output this shift: No intake/output data recorded.  Current Meds: Scheduled Meds:  acetaminophen  1,000 mg Oral Q6H   Or   acetaminophen (TYLENOL) oral liquid 160 mg/5 mL  1,000 mg Per Tube Q6H   ALPRAZolam  0.5 mg Oral QHS   aspirin EC  325 mg Oral Daily   Or   aspirin  324 mg Per Tube Daily   atorvastatin  80 mg Oral Daily   bisacodyl  10 mg Oral Daily   Or   bisacodyl  10 mg Rectal Daily   Chlorhexidine Gluconate Cloth  6 each Topical Daily   docusate sodium  200 mg Oral Daily   furosemide  20 mg Intravenous BID   guaiFENesin  600 mg Oral BID   insulin aspart  0-24 Units Subcutaneous Q4H   metoprolol tartrate  12.5 mg Oral BID   Or   metoprolol tartrate  12.5 mg Per Tube BID   mirtazapine  7.5 mg Oral QHS   pantoprazole  40 mg Oral Daily   sertraline  150 mg Oral Daily    sodium chloride flush  3 mL Intravenous Q12H   tamsulosin  0.4 mg Oral QHS   Continuous Infusions:  sodium chloride Stopped (05/23/23 1531)   sodium chloride     sodium chloride Stopped (05/23/23 0755)    ceFAZolin (ANCEF) IV 2 g (05/24/23 0746)   dexmedetomidine (PRECEDEX) IV infusion Stopped (05/22/23 1901)   epinephrine Stopped (05/22/23 2317)   lactated ringers     lactated ringers Stopped (05/22/23 1542)   lactated ringers Stopped (05/23/23 0755)   nitroGLYCERIN     norepinephrine (LEVOPHED) Adult infusion Stopped (05/23/23 0755)   phenylephrine (NEO-SYNEPHRINE) Adult infusion 0 mcg/min (05/22/23 1316)   vasopressin Stopped (05/23/23 0755)   PRN Meds:.sodium chloride, benzonatate, lactated ringers, metoprolol tartrate, midazolam, morphine injection, ondansetron (ZOFRAN) IV, oxyCODONE, sodium chloride flush, traMADol  General appearance: alert, cooperative, and no distress Neurologic: no focal deficits, sleepy and mildly confused Heart: NSR, No arrhythmias on monitor review Lungs: breath sounds fairly clear. Normal  Abdomen: soft, no tenderness with normal bowel sounds.  Extremities: minimal peripheral edema Wound: the sternotomy is covered with a dry dressing.  Lab Results: CBC: Recent Labs    05/23/23  1705 05/24/23 0457  WBC 14.3* 8.8  HGB 9.9* 8.9*  HCT 29.9* 27.0*  PLT 82* 73*   BMET:  Recent Labs    05/23/23 1705 05/24/23 0457  NA 131* 130*  K 5.2* 4.5  CL 97* 95*  CO2 27 27  GLUCOSE 149* 163*  BUN 21 26*  CREATININE 1.63* 1.54*  CALCIUM 8.8* 8.3*    CMET: Lab Results  Component Value Date   WBC 8.8 05/24/2023   HGB 8.9 (L) 05/24/2023   HCT 27.0 (L) 05/24/2023   PLT 73 (L) 05/24/2023   GLUCOSE 163 (H) 05/24/2023   CHOL 104 05/16/2023   TRIG 133 05/16/2023   HDL 33 (L) 05/16/2023   LDLCALC 44 05/16/2023   ALT 30 05/22/2023   AST 29 05/22/2023   NA 130 (L) 05/24/2023   K 4.5 05/24/2023   CL 95 (L) 05/24/2023   CREATININE 1.54 (H) 05/24/2023    BUN 26 (H) 05/24/2023   CO2 27 05/24/2023   TSH 1.840 06/14/2017   INR 1.5 (H) 05/22/2023   HGBA1C 6.9 (H) 05/18/2023      PT/INR:  Recent Labs    05/22/23 1331  LABPROT 18.6*  INR 1.5*   Radiology: No results found.   Assessment/Plan: S/P Procedure(s) (LRB): CORONARY ARTERY BYPASS GRAFTING (CABG) X 2 WITH LEFT INTERNAL MAMMARY ARTERY HARVEST AND GREATER SAPHENOUS VEIN HARVESTED ENDOSCOPICALLY (N/A) TRANSESOPHAGEAL ECHOCARDIOGRAM (N/A)  -POD1 CABG x 2 for recurrent angina, preserved LV function and also Bx of RUL nodule.  Stable VS and cardiac rhythm.  Continue to mobilize.   -PULM- on high-flow O2, CXR showing mild diffuse vascular congestion and RUL density. Continue diuresis, ambulation, IS as able.  PATH for RUL nodule pending.  -HEME- mild EBL anemia, tolerating OK. Plt count also trended down to 73K but doubt HIT. Continue to monitor.   -RENAL- Stage III CKD-creat stable. Good response to diuretic yesterday. Monitor, avoid hypotension.   -GI- tolerating PO's, continue bowel regimen.   -DVT PPX- mobilize, holding Lovenox due to thrombocytopenia.   -Disposition- continue ICU care until O2 requirement is more reasonable. PT /OT evaluations for potential discharge needs.    Gregory Roca, PA-C [phone] 05/24/2023 8:13 AM Patient seen and examined, agree with above Som eclearing in RUL, but also vascular congestion bilaterally Continue diuresis Mobilize  Gregory [name] C. Dorris Fetch, MD [facility] [phone]

## 2023-05-24 NOTE — Evaluation (Signed)
Physical Therapy Evaluation Patient Details Name: Gregory [name] MRN: [id number] DOB: December 17, 1934 Today's Date: 05/24/2023  History of Present Illness  Patient is an 89 y/o male admitted 05/22/23 and underwent CABG x 2 and had needle biopsy of R UL nodule.  PMH: CAD, coronary aneurysm of LAD, recent NSTEM-ACS x 20 Dec 2022 and 05/15/23, HTN, CVA, lung nodule, AAA, s/p carotid endarterectomy.  Clinical Impression  Patient presents with decreased mobility due to decreased balance, decreased activity tolerance, generalized weakness and new sternal precautions.  Currently min A for ambulation in hallway and cues for sternal precautions and safety.  Previously independent at home with wife.  Feel he will continue to benefit from skilled PT in the acute setting and from follow up HHPT at d/c.        Recommendations for follow up therapy are one component of a multi-disciplinary discharge planning process, led by the attending physician.  Recommendations may be updated based on patient status, additional functional criteria and insurance authorization.  Follow Up Recommendations       Assistance Recommended at Discharge Intermittent Supervision/Assistance  Patient can return home with the following  A little help with walking and/or transfers;A little help with bathing/dressing/bathroom;Help with stairs or ramp for entrance;Assist for transportation;Assistance with cooking/housework    Equipment Recommendations None recommended by PT  Recommendations for Other Services       Functional Status Assessment Patient has had a recent decline in their functional status and demonstrates the ability to make significant improvements in function in a reasonable and predictable amount of time.     Precautions / Restrictions Precautions Precautions: Fall;Sternal      Mobility  Bed Mobility               General bed mobility comments: up in recliner    Transfers Overall transfer level: Needs  assistance Equipment used: None Transfers: Sit to/from Stand Sit to Stand: Min assist           General transfer comment: cues for safety with holding heart pillow; to sit with hands on knees    Ambulation/Gait Ambulation/Gait assistance: Min assist Gait Distance (Feet): 250 Feet Assistive device: Fara Boros Gait Pattern/deviations: Step-through pattern       General Gait Details: walker moving sideways, but pt walking forward, assist around obstacles in the room and cues throughout to slow down with pt moving quickly and flexed somewhat with arms on walker; maintained on 15 L portable O2 (per RT)  Stairs            Wheelchair Mobility    Modified Rankin (Stroke Patients Only)       Balance Overall balance assessment: Needs assistance   Sitting balance-Leahy Scale: Fair     Standing balance support: Single extremity supported, During functional activity Standing balance-Leahy Scale: Poor Standing balance comment: minguard for balance when sitting with hands on knees, otherwise with UE support on walker                             Pertinent Vitals/Pain Pain Assessment Pain Assessment: Faces Faces Pain Scale: Hurts little more Pain Location: L leg Pain Descriptors / Indicators: Sore Pain Intervention(s): Monitored during session    Home Living Family/patient expects to be discharged to:: Private residence Living Arrangements: Spouse/significant other Available Help at Discharge: Family Type of Home: House Home Access: Stairs to enter Entrance Stairs-Rails: Left Entrance Stairs-Number of Steps: 4   Home Layout: Two  level;Able to live on main level with bedroom/bathroom Home Equipment: Agricultural consultant (2 wheels)      Prior Function Prior Level of Function : Independent/Modified Independent             Mobility Comments: owns a farm       Hand Dominance   Dominant Hand: Right    Extremity/Trunk Assessment   Upper Extremity  Assessment Upper Extremity Assessment: RUE deficits/detail;LUE deficits/detail RUE Deficits / Details: WFL within sternal precautions LUE Deficits / Details: WFL within sternal precautions    Lower Extremity Assessment Lower Extremity Assessment: LLE deficits/detail LLE Deficits / Details: limited due to pain in groin but can hold antigravity       Communication   Communication: No difficulties  Cognition Arousal/Alertness: Awake/alert Behavior During Therapy: WFL for tasks assessed/performed Overall Cognitive Status: Within Functional Limits for tasks assessed                                          General Comments General comments (skin integrity, edema, etc.): on 15 L O2 with ambulation, SpO2 100%, BP 115/66, HR 91 after ambulation    Exercises     Assessment/Plan    PT Assessment Patient needs continued PT services  PT Problem List Decreased strength;Decreased activity tolerance;Decreased balance;Decreased mobility;Cardiopulmonary status limiting activity;Decreased knowledge of use of DME       PT Treatment Interventions DME instruction;Balance training;Gait training;Stair training;Functional mobility training;Patient/family education;Therapeutic activities;Therapeutic exercise    PT Goals (Current goals can be found in the Care Plan section)  Acute Rehab PT Goals Patient Stated Goal: to return to independent PT Goal Formulation: With patient/family Time For Goal Achievement: 06/07/23 Potential to Achieve Goals: Good    Frequency Min 1X/week     Co-evaluation               AM-PAC PT "6 Clicks" Mobility  Outcome Measure Help needed turning from your back to your side while in a flat bed without using bedrails?: Total Help needed moving from lying on your back to sitting on the side of a flat bed without using bedrails?: A Lot Help needed moving to and from a bed to a chair (including a wheelchair)?: A Little Help needed standing up from  a chair using your arms (e.g., wheelchair or bedside chair)?: A Little Help needed to walk in hospital room?: A Little Help needed climbing 3-5 steps with a railing? : Total 6 Click Score: 13    End of Session Equipment Utilized During Treatment: Gait belt;Oxygen Activity Tolerance: Patient tolerated treatment well Patient left: in chair;with family/visitor present   PT Visit Diagnosis: Other abnormalities of gait and mobility (R26.89);Muscle weakness (generalized) (M62.81)    Time: 1610-9604 PT Time Calculation (min) (ACUTE ONLY): 26 min   Charges:   PT Evaluation $PT Eval Moderate Complexity: 1 Mod PT Treatments $Gait Training: 8-22 mins        Sheran Lawless, PT Acute Rehabilitation Services Office:[phone] 05/24/2023   Elray Mcgregor 05/24/2023, 4:28 PM

## 2023-05-25 ENCOUNTER — Inpatient Hospital Stay (HOSPITAL_COMMUNITY): Payer: PPO

## 2023-05-25 ENCOUNTER — Ambulatory Visit: Payer: Self-pay | Admitting: Cardiology

## 2023-05-25 LAB — BASIC METABOLIC PANEL
Anion gap: 8 (ref 5–15)
BUN: 30 mg/dL — ABNORMAL HIGH (ref 8–23)
CO2: 28 mmol/L (ref 22–32)
Calcium: 8.4 mg/dL — ABNORMAL LOW (ref 8.9–10.3)
Chloride: 99 mmol/L (ref 98–111)
Creatinine, Ser: 1.42 mg/dL — ABNORMAL HIGH (ref 0.61–1.24)
GFR, Estimated: 48 mL/min — ABNORMAL LOW (ref >60)
Glucose, Bld: 114 mg/dL — ABNORMAL HIGH (ref 70–99)
Potassium: 3.8 mmol/L (ref 3.5–5.1)
Sodium: 135 mmol/L (ref 135–145)

## 2023-05-25 LAB — GLUCOSE, CAPILLARY
Glucose-Capillary: 138 mg/dL — ABNORMAL HIGH (ref 70–99)
Glucose-Capillary: 156 mg/dL — ABNORMAL HIGH (ref 70–99)
Glucose-Capillary: 160 mg/dL — ABNORMAL HIGH (ref 70–99)
Glucose-Capillary: 104 mg/dL — ABNORMAL HIGH (ref 70–99)
Glucose-Capillary: 194 mg/dL — ABNORMAL HIGH (ref 70–99)
Glucose-Capillary: 170 mg/dL — ABNORMAL HIGH (ref 70–99)

## 2023-05-25 LAB — ACID FAST SMEAR (AFB, MYCOBACTERIA): Acid Fast Smear: NEGATIVE

## 2023-05-25 LAB — CBC
HCT: 26.7 % — ABNORMAL LOW (ref 39.0–52.0)
Hemoglobin: 8.8 g/dL — ABNORMAL LOW (ref 13.0–17.0)
MCH: 30.6 pg (ref 26.0–34.0)
MCHC: 33 g/dL (ref 30.0–36.0)
MCV: 92.7 fL (ref 80.0–100.0)
Platelets: 77 10*3/uL — ABNORMAL LOW (ref 150–400)
RBC: 2.88 MIL/uL — ABNORMAL LOW (ref 4.22–5.81)
RDW: 14.6 % (ref 11.5–15.5)
WBC: 6.2 10*3/uL (ref 4.0–10.5)
nRBC: 0 % (ref 0.0–0.2)

## 2023-05-25 LAB — MAGNESIUM: Magnesium: 2 mg/dL (ref 1.7–2.4)

## 2023-05-25 MED ORDER — POTASSIUM CHLORIDE CRYS ER 20 MEQ PO TBCR
20.0000 meq | EXTENDED_RELEASE_TABLET | Freq: Three times a day (TID) | ORAL | Status: AC
  Administered 2023-05-25 (×3): 20 meq via ORAL
  Filled 2023-05-25 (×3): qty 1

## 2023-05-25 MED ORDER — AMIODARONE HCL 200 MG PO TABS
400.0000 mg | ORAL_TABLET | Freq: Two times a day (BID) | ORAL | Status: DC
  Administered 2023-05-25 – 2023-05-28 (×7): 400 mg via ORAL
  Filled 2023-05-25 (×7): qty 2

## 2023-05-25 MED ORDER — FUROSEMIDE 10 MG/ML IJ SOLN
20.0000 mg | Freq: Once | INTRAMUSCULAR | Status: AC
  Administered 2023-05-25: 20 mg via INTRAVENOUS
  Filled 2023-05-25: qty 2

## 2023-05-25 MED ORDER — POTASSIUM CHLORIDE CRYS ER 20 MEQ PO TBCR
20.0000 meq | EXTENDED_RELEASE_TABLET | Freq: Two times a day (BID) | ORAL | Status: DC
  Administered 2023-05-26 (×2): 20 meq via ORAL
  Filled 2023-05-25 (×2): qty 1

## 2023-05-25 MED ORDER — FUROSEMIDE 40 MG PO TABS
40.0000 mg | ORAL_TABLET | Freq: Two times a day (BID) | ORAL | Status: DC
  Administered 2023-05-25 – 2023-05-27 (×4): 40 mg via ORAL
  Filled 2023-05-25 (×4): qty 1

## 2023-05-25 MED ORDER — METOPROLOL TARTRATE 25 MG PO TABS
25.0000 mg | ORAL_TABLET | Freq: Two times a day (BID) | ORAL | Status: DC
  Administered 2023-05-25 – 2023-05-28 (×7): 25 mg via ORAL
  Filled 2023-05-25 (×7): qty 1

## 2023-05-25 NOTE — Evaluation (Signed)
Occupational Therapy Evaluation Patient Details Name: Gregory [name] MRN: [id number] DOB: 1934/04/04 Today's Date: 05/25/2023   History of Present Illness Patient is an 89 y/o male admitted 05/22/23 and underwent CABG x 2 and had needle biopsy of R UL nodule.  PMH: CAD, coronary aneurysm of LAD, recent NSTEM-ACS x 20 Dec 2022 and 05/15/23, HTN, CVA, lung nodule, AAA, s/p carotid endarterectomy.   Clinical Impression   Patient admitted for the diagnosis above.  PTA he lives at home with his spouse and remains independent with mobility, ADL and iADL.  Patient presents with decreased short term memory, decreased awareness of sternal precautions, poor activity tolerance and fair dynamic balance.  Currently he is needing supervision for mobility at an Fara Boros level, and up to Mod A for upper body ADL due to sternal precautions.  OT will follow in the acute setting to address deficits, and HH OT can be considered depending on progress.        Recommendations for follow up therapy are one component of a multi-disciplinary discharge planning process, led by the attending physician.  Recommendations may be updated based on patient status, additional functional criteria and insurance authorization.   Assistance Recommended at Discharge Intermittent Supervision/Assistance  Patient can return home with the following Assist for transportation;Assistance with cooking/housework;A little help with walking and/or transfers;A little help with bathing/dressing/bathroom;Direct supervision/assist for medications management    Functional Status Assessment  Patient has had a recent decline in their functional status and demonstrates the ability to make significant improvements in function in a reasonable and predictable amount of time.  Equipment Recommendations  None recommended by OT    Recommendations for Other Services       Precautions / Restrictions Precautions Precautions:  Fall;Sternal Restrictions Weight Bearing Restrictions: Yes RUE Weight Bearing: Non weight bearing LUE Weight Bearing: Non weight bearing      Mobility Bed Mobility               General bed mobility comments: up in recliner    Transfers Overall transfer level: Needs assistance   Transfers: Sit to/from Stand Sit to Stand: Min guard                  Balance Overall balance assessment: Needs assistance Sitting-balance support: Feet supported Sitting balance-Leahy Scale: Good     Standing balance support: Reliant on assistive device for balance Standing balance-Leahy Scale: Fair                             ADL either performed or assessed with clinical judgement   ADL       Grooming: Wash/dry hands;Min guard;Standing           Upper Body Dressing : Moderate assistance;Standing   Lower Body Dressing: Minimal assistance;Sit to/from stand   Toilet Transfer: Min guard;Ambulation                   Vision Patient Visual Report: No change from baseline       Perception     Praxis      Pertinent Vitals/Pain Pain Assessment Pain Assessment: No/denies pain Pain Intervention(s): Monitored during session     Hand Dominance Right   Extremity/Trunk Assessment Upper Extremity Assessment Upper Extremity Assessment: Overall WFL for tasks assessed   Lower Extremity Assessment Lower Extremity Assessment: Defer to PT evaluation       Communication Communication Communication: No difficulties   Cognition Arousal/Alertness: Awake/alert  Behavior During Therapy: WFL for tasks assessed/performed Overall Cognitive Status: Impaired/Different from baseline Area of Impairment: Memory, Following commands, Safety/judgement                     Memory: Decreased recall of precautions, Decreased short-term memory Following Commands: Follows multi-step commands with increased time Safety/Judgement: Decreased awareness of safety            General Comments   O2 decreased to 6L    Exercises     Shoulder Instructions      Home Living Family/patient expects to be discharged to:: Private residence Living Arrangements: Spouse/significant other Available Help at Discharge: Family Type of Home: House Home Access: Stairs to enter Secretary/administrator of Steps: 4 Entrance Stairs-Rails: Left Home Layout: Two level;Able to live on main level with bedroom/bathroom     Bathroom Shower/Tub: Walk-in shower   Bathroom Toilet: Handicapped height     Home Equipment: Agricultural consultant (2 wheels)          Prior Functioning/Environment Prior Level of Function : Independent/Modified Independent             Mobility Comments: owns a farm ADLs Comments: Ind with ADL, ? assist with meds and bill payment given STM deficit        OT Problem List: Decreased activity tolerance;Impaired balance (sitting and/or standing);Decreased safety awareness      OT Treatment/Interventions: Self-care/ADL training;Therapeutic exercise;Therapeutic activities;Patient/family education;DME and/or AE instruction;Balance training;Energy conservation    OT Goals(Current goals can be found in the care plan section) Acute Rehab OT Goals Patient Stated Goal: Return home OT Goal Formulation: With patient Time For Goal Achievement: 06/08/23 Potential to Achieve Goals: Good ADL Goals Pt Will Perform Grooming: with modified independence;standing Pt Will Perform Upper Body Dressing: with supervision;sitting;standing Pt Will Perform Lower Body Dressing: with supervision;sit to/from stand Pt Will Transfer to Toilet: with modified independence;ambulating;regular height toilet Pt/caregiver will Perform Home Exercise Program: Increased ROM;Both right and left upper extremity;With Supervision  OT Frequency: Min 2X/week    Co-evaluation              AM-PAC OT "6 Clicks" Daily Activity     Outcome Measure Help from another person eating  meals?: None Help from another person taking care of personal grooming?: A Little Help from another person toileting, which includes using toliet, bedpan, or urinal?: A Little Help from another person bathing (including washing, rinsing, drying)?: A Lot Help from another person to put on and taking off regular upper body clothing?: A Lot Help from another person to put on and taking off regular lower body clothing?: A Little 6 Click Score: 17   End of Session Equipment Utilized During Treatment: Gait belt;Oxygen Nurse Communication: Mobility status  Activity Tolerance: Patient tolerated treatment well Patient left: in chair;with call bell/phone within reach;with chair alarm set  OT Visit Diagnosis: Unsteadiness on feet (R26.81)                Time: 7628-3151 OT Time Calculation (min): 25 min Charges:  OT General Charges $OT Visit: 1 Visit OT Evaluation $OT Eval Moderate Complexity: 1 Mod OT Treatments $Self Care/Home Management : 8-22 mins  05/25/2023  RP, OTR/L  Acute Rehabilitation Services  Office:  [phone]   Suzanna Obey 05/25/2023, 1:17 PM

## 2023-05-25 NOTE — Progress Notes (Signed)
Patient ID: Gregory [name], male   DOB: May 05, 1934, 89 y.o.   MRN: [id number]  TCTS Evening Rounds:   Hemodynamically stable in sinus rhythm.  Oxygen decreased to 4L HFNC today.   Ambulated.

## 2023-05-25 NOTE — Progress Notes (Signed)
Patient stated he was chewing gum, bag of chewing tobacco visualized in personal belongings. Verified that patient had put chewing tobacco in his mouth and this RN asked him to spit it out. Educated patient on negative effects of chewing tobacco, especially after open heart surgery, and reemphasized that [facility] is a tobacco-free campus. Chewing tobacco removed from room and placed in bag with patient ID sticker. Will give to family to take home.

## 2023-05-25 NOTE — Progress Notes (Signed)
TCTS DAILY ICU PROGRESS NOTE                   [address]            [place] [address]          [phone]   3 Days Post-Op Procedure(s) (LRB): CORONARY ARTERY BYPASS GRAFTING (CABG) X 2 WITH LEFT INTERNAL MAMMARY ARTERY HARVEST AND GREATER SAPHENOUS VEIN HARVESTED ENDOSCOPICALLY (N/A) TRANSESOPHAGEAL ECHOCARDIOGRAM (N/A)  Total Length of Stay:  LOS: 3 days   Subjective: Alert and oriented, ate full breakfast.  He has no new complaints.   BM x 2 yesterday O2 down to 6LHF nasal cannula.  Objective: Vital signs in last 24 hours: Temp:  [97.6 F (36.4 C)-99.3 F (37.4 C)] 97.8 F (36.6 C) (06/05 2300) Pulse Rate:  [70-82] 74 (06/05 2300) Cardiac Rhythm: Normal sinus rhythm (06/05 2000) Resp:  [10-20] 14 (06/05 2300) BP: (92-132)/(56-101) 107/61 (06/05 2300) SpO2:  [89 %-100 %] 99 % (06/05 2300) Weight:  [86.2 kg] 86.2 kg (06/06 0500)  Filed Weights   05/24/23 0500 05/25/23 0500  Weight: 88.2 kg 86.2 kg    Weight change: -2 kg   Intake/Output from previous day: 06/05 0701 - 06/06 0700 In: 255 [P.O.:240; I.V.:15] Out: 2350 [Urine:2350]  Intake/Output this shift: No intake/output data recorded.  Current Meds: Scheduled Meds:  acetaminophen  1,000 mg Oral Q6H   Or   acetaminophen (TYLENOL) oral liquid 160 mg/5 mL  1,000 mg Per Tube Q6H   ALPRAZolam  0.5 mg Oral QHS   amiodarone  400 mg Oral BID   aspirin EC  325 mg Oral Daily   Or   aspirin  324 mg Per Tube Daily   atorvastatin  80 mg Oral Daily   bisacodyl  10 mg Oral Daily   Or   bisacodyl  10 mg Rectal Daily   Chlorhexidine Gluconate Cloth  6 each Topical Daily   docusate sodium  200 mg Oral Daily   furosemide  20 mg Intravenous Once   furosemide  40 mg Oral BID   guaiFENesin  600 mg Oral BID   insulin aspart  0-24 Units Subcutaneous Q4H   metoprolol tartrate  25 mg Oral BID   mirtazapine  7.5 mg Oral QHS   pantoprazole  40 mg Oral Daily   potassium chloride  20 mEq Oral TID    [START ON 05/26/2023] potassium chloride  20 mEq Oral BID   sertraline  150 mg Oral Daily   sodium chloride flush  3 mL Intravenous Q12H   tamsulosin  0.4 mg Oral QHS   Continuous Infusions:  sodium chloride Stopped (05/23/23 1531)   sodium chloride     sodium chloride Stopped (05/23/23 0755)   dexmedetomidine (PRECEDEX) IV infusion Stopped (05/22/23 1901)   epinephrine Stopped (05/22/23 2317)   lactated ringers     lactated ringers Stopped (05/22/23 1542)   lactated ringers Stopped (05/23/23 0755)   nitroGLYCERIN     norepinephrine (LEVOPHED) Adult infusion Stopped (05/23/23 0755)   phenylephrine (NEO-SYNEPHRINE) Adult infusion 0 mcg/min (05/22/23 1316)   vasopressin Stopped (05/23/23 0755)   PRN Meds:.sodium chloride, benzonatate, lactated ringers, metoprolol tartrate, midazolam, morphine injection, ondansetron (ZOFRAN) IV, oxyCODONE, sodium chloride flush, traMADol  General appearance: alert, cooperative, and no distress Neurologic: no focal deficits, mental status appropriate Heart: NSR, Was in a-fib with CVR for about an hour ths morning Lungs: breath sounds clear. Normal WOB, Abdomen: soft, no tenderness with normal bowel sounds.  Extremities: no peripheral edema Wound: the sternotomy is well approximated and dry  Lab Results: CBC: Recent Labs    05/24/23 0457 05/25/23 0420  WBC 8.8 6.2  HGB 8.9* 8.8*  HCT 27.0* 26.7*  PLT 73* 77*    BMET:  Recent Labs    05/24/23 0457 05/25/23 0420  NA 130* 135  K 4.5 3.8  CL 95* 99  CO2 27 28  GLUCOSE 163* 114*  BUN 26* 30*  CREATININE 1.54* 1.42*  CALCIUM 8.3* 8.4*     CMET: Lab Results  Component Value Date   WBC 6.2 05/25/2023   HGB 8.8 (L) 05/25/2023   HCT 26.7 (L) 05/25/2023   PLT 77 (L) 05/25/2023   GLUCOSE 114 (H) 05/25/2023   CHOL 104 05/16/2023   TRIG 133 05/16/2023   HDL 33 (L) 05/16/2023   LDLCALC 44 05/16/2023   ALT 30 05/22/2023   AST 29 05/22/2023   NA 135 05/25/2023   K 3.8 05/25/2023   CL  99 05/25/2023   CREATININE 1.42 (H) 05/25/2023   BUN 30 (H) 05/25/2023   CO2 28 05/25/2023   TSH 1.840 06/14/2017   INR 1.5 (H) 05/22/2023   HGBA1C 6.9 (H) 05/18/2023      PT/INR:  Recent Labs    05/22/23 1331  LABPROT 18.6*  INR 1.5*    Radiology: No results found.   Assessment/Plan: S/P Procedure(s) (LRB): CORONARY ARTERY BYPASS GRAFTING (CABG) X 2 WITH LEFT INTERNAL MAMMARY ARTERY HARVEST AND GREATER SAPHENOUS VEIN HARVESTED ENDOSCOPICALLY (N/A) TRANSESOPHAGEAL ECHOCARDIOGRAM (N/A)  -POD3 CABG x 2 for recurrent angina, preserved LV function and also Bx of RUL nodule.  Stable VS, Continue to mobilize.   -Atrial fibrillation- rate-controlled and lasted for about 1 hour. K+ 3.8 and Mg++ 2.0. Will supplement K+, increase the metoprolol to 25mg  BID and start oral amiodarone loading.   -PULM- improving. Able to wean high-flow O2 from 12L -> 6L, CXR improving. Continue diuresis, ambulation and IS as able.  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  -HEME- mild EBL anemia, tolerating OK. Plt count recovering. Continue to monitor.   -RENAL- Stage III CKD-creat stable. Good response to diuretic yesterday. Monitor, avoid hypotension.   -GI- tolerating PO's, BM x 2 yesterday.   -DVT PPX- mobilize, holding Lovenox due to thrombocytopenia.   -Disposition- continue ICU care until consistently requiring less than 6L O2 and rhythm stable.     Leary Roca, PA-C [phone] 05/25/2023 8:07 AM

## 2023-05-26 ENCOUNTER — Inpatient Hospital Stay (HOSPITAL_COMMUNITY): Payer: PPO

## 2023-05-26 LAB — GLUCOSE, CAPILLARY
Glucose-Capillary: 92 mg/dL (ref 70–99)
Glucose-Capillary: 145 mg/dL — ABNORMAL HIGH (ref 70–99)
Glucose-Capillary: 136 mg/dL — ABNORMAL HIGH (ref 70–99)
Glucose-Capillary: 129 mg/dL — ABNORMAL HIGH (ref 70–99)
Glucose-Capillary: 159 mg/dL — ABNORMAL HIGH (ref 70–99)
Glucose-Capillary: 164 mg/dL — ABNORMAL HIGH (ref 70–99)
Glucose-Capillary: 173 mg/dL — ABNORMAL HIGH (ref 70–99)
Glucose-Capillary: 181 mg/dL — ABNORMAL HIGH (ref 70–99)

## 2023-05-26 LAB — TYPE AND SCREEN
Antibody Screen: NEGATIVE
Unit division: 0
Unit division: 0

## 2023-05-26 LAB — CBC
HCT: 28.4 % — ABNORMAL LOW (ref 39.0–52.0)
Hemoglobin: 9.2 g/dL — ABNORMAL LOW (ref 13.0–17.0)
MCH: 29.4 pg (ref 26.0–34.0)
MCHC: 32.4 g/dL (ref 30.0–36.0)
MCV: 90.7 fL (ref 80.0–100.0)
Platelets: 106 10*3/uL — ABNORMAL LOW (ref 150–400)
RBC: 3.13 MIL/uL — ABNORMAL LOW (ref 4.22–5.81)
RDW: 14.7 % (ref 11.5–15.5)
WBC: 6.4 10*3/uL (ref 4.0–10.5)
nRBC: 0 % (ref 0.0–0.2)

## 2023-05-26 LAB — BPAM RBC
Blood Product Expiration Date: 202407012359
ISSUE DATE / TIME: 202406030801
Unit Type and Rh: 5100
Unit Type and Rh: 5100
Unit Type and Rh: 5100
Unit Type and Rh: 5100

## 2023-05-26 LAB — BASIC METABOLIC PANEL
Anion gap: 8 (ref 5–15)
BUN: 25 mg/dL — ABNORMAL HIGH (ref 8–23)
CO2: 28 mmol/L (ref 22–32)
Calcium: 8.6 mg/dL — ABNORMAL LOW (ref 8.9–10.3)
Chloride: 98 mmol/L (ref 98–111)
Creatinine, Ser: 1.42 mg/dL — ABNORMAL HIGH (ref 0.61–1.24)
GFR, Estimated: 48 mL/min — ABNORMAL LOW (ref >60)
Glucose, Bld: 139 mg/dL — ABNORMAL HIGH (ref 70–99)
Potassium: 4.1 mmol/L (ref 3.5–5.1)
Sodium: 134 mmol/L — ABNORMAL LOW (ref 135–145)

## 2023-05-26 MED ORDER — ~~LOC~~ CARDIAC SURGERY, PATIENT & FAMILY EDUCATION: Freq: Once | Status: AC

## 2023-05-26 MED ORDER — SODIUM CHLORIDE 0.9% FLUSH
3.0000 mL | Freq: Two times a day (BID) | INTRAVENOUS | Status: DC
  Administered 2023-05-26 – 2023-05-28 (×5): 3 mL via INTRAVENOUS

## 2023-05-26 MED ORDER — ALUM & MAG HYDROXIDE-SIMETH 200-200-20 MG/5ML PO SUSP: 15.0000 mL | Freq: Four times a day (QID) | ORAL | Status: DC | PRN

## 2023-05-26 MED ORDER — SODIUM CHLORIDE 0.9 % IV SOLN: 250.0000 mL | INTRAVENOUS | Status: DC | PRN

## 2023-05-26 MED ORDER — SODIUM CHLORIDE 0.9% FLUSH: 3.0000 mL | INTRAVENOUS | Status: DC | PRN

## 2023-05-26 MED ORDER — MAGNESIUM HYDROXIDE 400 MG/5ML PO SUSP: 30.0000 mL | Freq: Every day | ORAL | Status: DC | PRN

## 2023-05-26 MED ORDER — INSULIN ASPART 100 UNIT/ML IJ SOLN
0.0000 [IU] | Freq: Three times a day (TID) | INTRAMUSCULAR | Status: DC
  Administered 2023-05-26 (×2): 3 [IU] via SUBCUTANEOUS
  Administered 2023-05-27: 2 [IU] via SUBCUTANEOUS
  Administered 2023-05-27: 5 [IU] via SUBCUTANEOUS
  Administered 2023-05-28: 2 [IU] via SUBCUTANEOUS

## 2023-05-26 MED FILL — Mannitol IV Soln 20%: INTRAVENOUS | Qty: 500 | Status: AC

## 2023-05-26 MED FILL — Lidocaine HCl Local Soln Prefilled Syringe 100 MG/5ML (2%): INTRAMUSCULAR | Qty: 5 | Status: AC

## 2023-05-26 MED FILL — Sodium Chloride IV Soln 0.9%: INTRAVENOUS | Qty: 2000 | Status: AC

## 2023-05-26 MED FILL — Heparin Sodium (Porcine) Inj 1000 Unit/ML: INTRAMUSCULAR | Qty: 10 | Status: AC

## 2023-05-26 MED FILL — Electrolyte-R (PH 7.4) Solution: INTRAVENOUS | Qty: 4000 | Status: AC

## 2023-05-26 MED FILL — Sodium Bicarbonate IV Soln 8.4%: INTRAVENOUS | Qty: 50 | Status: AC

## 2023-05-26 NOTE — Progress Notes (Signed)
4 Days Post-Op Procedure(s) (LRB): CORONARY ARTERY BYPASS GRAFTING (CABG) X 2 WITH LEFT INTERNAL MAMMARY ARTERY HARVEST AND GREATER SAPHENOUS VEIN HARVESTED ENDOSCOPICALLY (N/A) TRANSESOPHAGEAL ECHOCARDIOGRAM (N/A) Subjective: Feels well  Objective: Vital signs in last 24 hours: Temp:  [99.9 F (37.7 C)] 99.9 F (37.7 C) (06/06 1116) Pulse Rate:  [71-94] 79 (06/07 0700) Cardiac Rhythm: Normal sinus rhythm (06/07 0400) Resp:  [12-26] 15 (06/07 0700) BP: (94-138)/(54-87) 112/72 (06/07 0603) SpO2:  [85 %-99 %] 96 % (06/07 0700) Weight:  [84.9 kg] 84.9 kg (06/07 0500)  Hemodynamic parameters for last 24 hours:    Intake/Output from previous day: 06/06 0701 - 06/07 0700 In: -  Out: 3450 [Urine:3450] Intake/Output this shift: No intake/output data recorded.  General appearance: alert, cooperative, and no distress Neurologic: intact Heart: regular rate and rhythm Lungs: diminished breath sounds bibasilar  Lab Results: Recent Labs    05/25/23 0420 05/26/23 0223  WBC 6.2 6.4  HGB 8.8* 9.2*  HCT 26.7* 28.4*  PLT 77* 106*   BMET:  Recent Labs    05/25/23 0420 05/26/23 0223  NA 135 134*  K 3.8 4.1  CL 99 98  CO2 28 28  GLUCOSE 114* 139*  BUN 30* 25*  CREATININE 1.42* 1.42*  CALCIUM 8.4* 8.6*    PT/INR: No results for input(s): "LABPROT", "INR" in the last 72 hours. ABG    Component Value Date/Time   PHART 7.364 05/23/2023 0006   HCO3 25.2 05/23/2023 0006   TCO2 26 05/23/2023 0006   ACIDBASEDEF 9.0 (H) 05/22/2023 2157   O2SAT 98 05/23/2023 0006   CBG (last 3)  Recent Labs    05/25/23 0422 05/25/23 0736 05/25/23 1115  GLUCAP 104* 194* 170*    Assessment/Plan: S/P Procedure(s) (LRB): CORONARY ARTERY BYPASS GRAFTING (CABG) X 2 WITH LEFT INTERNAL MAMMARY ARTERY HARVEST AND GREATER SAPHENOUS VEIN HARVESTED ENDOSCOPICALLY (N/A) TRANSESOPHAGEAL ECHOCARDIOGRAM (N/A) Plan for transfer to step-down: see transfer orders NEURO- intact CV- maintaining SR with  amiodarone + metoprolol RESP- still with relatively high O2 demand- wean as tolerated  CXR slowly improving RUL opacity, stable tiny right apical pneumo  Continue pulmonary hygiene, diuresis  Biopsy + squamous cell carcinoma RENAL- diuresing well  Creatinine stable, lytes oK ENDO- CBG moderately elevated  Change SSI to AC Gi- tolerating PO Thrombocytopenia improved Continue Cardiac rehab, PT, OT   LOS: 4 days    [name] 05/26/2023

## 2023-05-26 NOTE — Plan of Care (Signed)
Plan of care updated

## 2023-05-26 NOTE — Progress Notes (Signed)
Physical Therapy Treatment Patient Details Name: Gregory [name] MRN: [id number] DOB: 05/04/1934 Today's Date: 05/26/2023   History of Present Illness Patient is an 89 y/o male admitted 05/22/23 and underwent CABG x 2 and had needle biopsy of R UL nodule.  PMH: CAD, coronary aneurysm of LAD, recent NSTEM-ACS x 20 Dec 2022 and 05/15/23, HTN, CVA, lung nodule, AAA, s/p carotid endarterectomy.    PT Comments    Pt sitting up in recliner, taking medication with RN. Pt able to recall 2/3 sternal precautions but has decreased carryover during session. Pt limited by decreased short term memory and decreased safety awareness especially in terms of RW use and navigation. Pt is min guard for power up to RW while holding heart pillow. Progressing his ambulation distance, but continues to have difficulty with navigation around obstacles in hallway and in tight spaces in his room. D/c plan remain appropriate if he has 24 hour assist. PT will continue to follow acutely.   Recommendations for follow up therapy are one component of a multi-disciplinary discharge planning process, led by the attending physician.  Recommendations may be updated based on patient status, additional functional criteria and insurance authorization.     Assistance Recommended at Discharge Intermittent Supervision/Assistance  Patient can return home with the following A little help with walking and/or transfers;A little help with bathing/dressing/bathroom;Help with stairs or ramp for entrance;Assist for transportation;Assistance with cooking/housework   Equipment Recommendations  None recommended by PT       Precautions / Restrictions Precautions Precautions: Fall;Sternal Precaution Comments: able to recall, 2/3 sternal precaution, however does not have good carry over with practice Restrictions Weight Bearing Restrictions: Yes (sternal precuations) RUE Weight Bearing: Non weight bearing (Sternal precautions) LUE Weight Bearing: Non  weight bearing (Sternal precautions)     Mobility  Bed Mobility               General bed mobility comments: up in recliner    Transfers Overall transfer level: Needs assistance Equipment used: None Transfers: Sit to/from Stand Sit to Stand: Min guard           General transfer comment: cues for safety with holding heart pillow with power up and return to seated.    Ambulation/Gait Ambulation/Gait assistance: Min assist Gait Distance (Feet): 665 Feet Assistive device: Rolling walker (2 wheels) Gait Pattern/deviations: Step-through pattern Gait velocity: variable, often to fast for conditions Gait velocity interpretation: 1.31 - 2.62 ft/sec, indicative of limited community ambulator   General Gait Details: despite change to RW pt continues to have L sided drift with RW requiring physical assist to navigate around obstacles and people in hallway, cues for decreasing gait speed especially with taking corners, difficult to determine if pt not hearing commands, and or slowed processing       Balance Overall balance assessment: Needs assistance Sitting-balance support: Feet supported Sitting balance-Leahy Scale: Good     Standing balance support: Reliant on assistive device for balance Standing balance-Leahy Scale: Fair Standing balance comment: with light support from RW                            Cognition Arousal/Alertness: Awake/alert Behavior During Therapy: WFL for tasks assessed/performed Overall Cognitive Status: Impaired/Different from baseline Area of Impairment: Memory, Following commands, Safety/judgement                     Memory: Decreased recall of precautions, Decreased short-term memory Following Commands: Follows multi-step  commands with increased time Safety/Judgement: Decreased awareness of safety     General Comments: HOH, decreased recall of precautions, requires increased cuing and ultimately physical assist for  navigation in hallways with RW        Exercises General Exercises - Lower Extremity Long Arc Quad: AROM, Seated, 10 reps, Both Hip Flexion/Marching: Seated, 10 reps    General Comments General comments (skin integrity, edema, etc.): Pt on 6L O2 via [place], SpO2 >90%O2 throughout session, HR in high 90s with ambulation      Pertinent Vitals/Pain Pain Assessment Pain Assessment: No/denies pain     PT Goals (current goals can now be found in the care plan section) Acute Rehab PT Goals Patient Stated Goal: to return to independent PT Goal Formulation: With patient/family Time For Goal Achievement: 06/07/23 Potential to Achieve Goals: Good Progress towards PT goals: Progressing toward goals    Frequency    Min 1X/week      PT Plan Current plan remains appropriate       AM-PAC PT "6 Clicks" Mobility   Outcome Measure  Help needed turning from your back to your side while in a flat bed without using bedrails?: Total Help needed moving from lying on your back to sitting on the side of a flat bed without using bedrails?: A Lot Help needed moving to and from a bed to a chair (including a wheelchair)?: A Little Help needed standing up from a chair using your arms (e.g., wheelchair or bedside chair)?: A Little Help needed to walk in hospital room?: A Little Help needed climbing 3-5 steps with a railing? : Total 6 Click Score: 13    End of Session Equipment Utilized During Treatment: Gait belt;Oxygen Activity Tolerance: Patient tolerated treatment well Patient left: in chair;with chair alarm set   PT Visit Diagnosis: Other abnormalities of gait and mobility (R26.89);Muscle weakness (generalized) (M62.81)     Time: 4098-1191 PT Time Calculation (min) (ACUTE ONLY): 38 min  Charges:  $Gait Training: 23-37 mins $Therapeutic Activity: 8-22 mins                     Lakira Ogando B. Beverely Risen PT, DPT Acute Rehabilitation Services Please use secure chat or  Call Office [phone]    Elon Alas [facility] 05/26/2023, 11:27 AM

## 2023-05-27 ENCOUNTER — Inpatient Hospital Stay (HOSPITAL_COMMUNITY): Payer: PPO

## 2023-05-27 LAB — BASIC METABOLIC PANEL
Anion gap: 9 (ref 5–15)
BUN: 26 mg/dL — ABNORMAL HIGH (ref 8–23)
CO2: 28 mmol/L (ref 22–32)
Calcium: 8.2 mg/dL — ABNORMAL LOW (ref 8.9–10.3)
Chloride: 97 mmol/L — ABNORMAL LOW (ref 98–111)
Creatinine, Ser: 1.46 mg/dL — ABNORMAL HIGH (ref 0.61–1.24)
GFR, Estimated: 47 mL/min — ABNORMAL LOW (ref >60)
Glucose, Bld: 152 mg/dL — ABNORMAL HIGH (ref 70–99)
Potassium: 3.6 mmol/L (ref 3.5–5.1)
Sodium: 134 mmol/L — ABNORMAL LOW (ref 135–145)

## 2023-05-27 LAB — GLUCOSE, CAPILLARY
Glucose-Capillary: 146 mg/dL — ABNORMAL HIGH (ref 70–99)
Glucose-Capillary: 203 mg/dL — ABNORMAL HIGH (ref 70–99)
Glucose-Capillary: 113 mg/dL — ABNORMAL HIGH (ref 70–99)
Glucose-Capillary: 179 mg/dL — ABNORMAL HIGH (ref 70–99)

## 2023-05-27 LAB — CBC
HCT: 26.8 % — ABNORMAL LOW (ref 39.0–52.0)
Hemoglobin: 8.9 g/dL — ABNORMAL LOW (ref 13.0–17.0)
MCH: 30.5 pg (ref 26.0–34.0)
MCHC: 33.2 g/dL (ref 30.0–36.0)
MCV: 91.8 fL (ref 80.0–100.0)
Platelets: 130 10*3/uL — ABNORMAL LOW (ref 150–400)
RBC: 2.92 MIL/uL — ABNORMAL LOW (ref 4.22–5.81)
RDW: 14.6 % (ref 11.5–15.5)
WBC: 7.1 10*3/uL (ref 4.0–10.5)
nRBC: 0 % (ref 0.0–0.2)

## 2023-05-27 MED ORDER — FE FUM-VIT C-VIT B12-FA 460-60-0.01-1 MG PO CAPS
1.0000 | ORAL_CAPSULE | Freq: Every day | ORAL | Status: DC
  Administered 2023-05-27 – 2023-05-28 (×2): 1 via ORAL
  Filled 2023-05-27 (×2): qty 1

## 2023-05-27 MED ORDER — FUROSEMIDE 40 MG PO TABS
40.0000 mg | ORAL_TABLET | Freq: Every day | ORAL | Status: DC
  Filled 2023-05-27: qty 1

## 2023-05-27 MED ORDER — POTASSIUM CHLORIDE CRYS ER 20 MEQ PO TBCR
30.0000 meq | EXTENDED_RELEASE_TABLET | Freq: Two times a day (BID) | ORAL | Status: AC
  Administered 2023-05-27 (×2): 30 meq via ORAL
  Filled 2023-05-27 (×2): qty 1

## 2023-05-27 NOTE — Progress Notes (Addendum)
      [address]       [place] [address]             [phone]        5 Days Post-Op Procedure(s) (LRB): CORONARY ARTERY BYPASS GRAFTING (CABG) X 2 WITH LEFT INTERNAL MAMMARY ARTERY HARVEST AND GREATER SAPHENOUS VEIN HARVESTED ENDOSCOPICALLY (N/A) TRANSESOPHAGEAL ECHOCARDIOGRAM (N/A)  Subjective: Patient thinks breathing is improving. He has no specific complaint this am.  Objective: Vital signs in last 24 hours: Temp:  [97.8 F (36.6 C)-98.7 F (37.1 C)] 98.7 F (37.1 C) (06/08 0439) Pulse Rate:  [69-78] 76 (06/08 0439) Cardiac Rhythm: Normal sinus rhythm (06/08 0340) Resp:  [15-20] 16 (06/08 0439) BP: (105-124)/(61-70) 117/63 (06/08 0439) SpO2:  [94 %-97 %] 94 % (06/08 0439) Weight:  [84.1 kg] 84.1 kg (06/08 0439)  Pre op weight 84.1 kg Current Weight  05/27/23 84.1 kg       Intake/Output from previous day: 06/07 0701 - 06/08 0700 In: 250 [P.O.:250] Out: 650 [Urine:650]   Physical Exam:  Cardiovascular: RRR Pulmonary: Diminished bibasilar breath sounds Abdomen: Soft, non tender, bowel sounds present. Extremities: Mild bilateral lower extremity edema. Wounds: Clean and dry.  No erythema or signs of infection.  Lab Results: CBC: Recent Labs    05/26/23 0223 05/27/23 0147  WBC 6.4 7.1  HGB 9.2* 8.9*  HCT 28.4* 26.8*  PLT 106* 130*   BMET:  Recent Labs    05/26/23 0223 05/27/23 0147  NA 134* 134*  K 4.1 3.6  CL 98 97*  CO2 28 28  GLUCOSE 139* 152*  BUN 25* 26*  CREATININE 1.42* 1.46*  CALCIUM 8.6* 8.2*    PT/INR:  Lab Results  Component Value Date   INR 1.5 (H) 05/22/2023   INR 1.1 05/18/2023   INR 1.1 04/25/2023   ABG:  INR: Will add last result for INR, ABG once components are confirmed Will add last 4 CBG results once components are confirmed  Assessment/Plan:  1. CV - Previous a fib. Has been maintaining SR. On Amiodarone 400 mg bid and Lopressor 25 mg bid. 2.  Pulmonary - On 4L HFNC and being weaned as  able. PA/LAT CXR this am appears to show patient rotated to the right, small right apical pneumothorax, right sided and left base atelectasis, small effusions. RUL biopsy showed SCC. Encourage incentive spirometer and flutter valve 3. Volume Overload - On Lasix 40 mg bid;likely decrease in am 4.  Expected post op acute blood loss anemia - H and H this am slightly decreased to 8.9 and 26.8. Start Trigels Forte 5. CBGs 173/181/146. Pre op HGA1C 6.9. He will need to follow up with his medical doctor after discharge for new diabetes. 6. Supplement potassium 7. CKD (IIIb)-Creatinine this am slightly increased to 1.46. Creatinine upon admission was 1.39 8. Thrombocytopenia-platelets up to 130,000 9. Remove EPW  Gregory M ZimmermanPA-C 7:09 AM   Agree with above Watching creat Dispo planning  Gregory [name]

## 2023-05-27 NOTE — Progress Notes (Addendum)
Mobility Specialist Progress Note   05/27/23 1528  Mobility  Activity Ambulated with assistance in hallway;Ambulated with assistance to bathroom  Level of Assistance Minimal assist, patient does 75% or more  Assistive Device Front wheel walker  Distance Ambulated (ft) 230 ft  Activity Response Tolerated well  Mobility Referral Yes  $Mobility charge 1 Mobility  Mobility Specialist Start Time (ACUTE ONLY) 1508  Mobility Specialist Stop Time (ACUTE ONLY) 1524  Mobility Specialist Time Calculation (min) (ACUTE ONLY) 16 min   Pre Mobility: 89/59 BP, 95% SpO2 on 2LO2 During Mobility: 78/51 BP, 92% SpO2 on RA Post Mobility: 115/58 BP, 95% SpO2 on 2LO2  Received in bed having no complaints and agreeable. MinA to EOB to maintain sternal precautions and minG to stand. Pt having slight lean to the right throughout ambulation + requiring cues on proximity to RW but no LOB. Returned to [facility] where pt needed minA to descend on to toilet d/t low levels and maintenance on sternal precautions. Left in BR w/ NT aware of positioning.   Frederico Hamman Mobility Specialist Please contact via SecureChat or  Rehab office at [phone]

## 2023-05-27 NOTE — Progress Notes (Signed)
CARDIAC REHAB PHASE I   PRE:  Rate/Rhythm: 78 NSR  BP:  Sitting: 114/68      SaO2: 90 RA  MODE:  Ambulation: 240 ft   AD:   RW  POST:  Rate/Rhythm: 90 nsr  BP:  Sitting: Not taken:       SaO2: Not taken  Pt amb with contact guard assistance, pt denies CP and SOB during amb and was returned to room w/o complaint. Pt HOH, needed multiple verbal cues to avoid using hands when standing.  Gregory [name]  ACSM-CEP 12:19 PM 05/27/2023    Service time is from 1200 to 1222.

## 2023-05-27 NOTE — Progress Notes (Signed)
Pacing wires removed per order, Vitals q15, patient denies pain. A&O x 4

## 2023-05-28 LAB — BASIC METABOLIC PANEL
Anion gap: 12 (ref 5–15)
BUN: 27 mg/dL — ABNORMAL HIGH (ref 8–23)
CO2: 24 mmol/L (ref 22–32)
Calcium: 9.1 mg/dL (ref 8.9–10.3)
Chloride: 99 mmol/L (ref 98–111)
Creatinine, Ser: 1.5 mg/dL — ABNORMAL HIGH (ref 0.61–1.24)
GFR, Estimated: 45 mL/min — ABNORMAL LOW (ref >60)
Glucose, Bld: 139 mg/dL — ABNORMAL HIGH (ref 70–99)
Potassium: 4 mmol/L (ref 3.5–5.1)
Sodium: 135 mmol/L (ref 135–145)

## 2023-05-28 LAB — GLUCOSE, CAPILLARY
Glucose-Capillary: 148 mg/dL — ABNORMAL HIGH (ref 70–99)
Glucose-Capillary: 139 mg/dL — ABNORMAL HIGH (ref 70–99)
Glucose-Capillary: 144 mg/dL — ABNORMAL HIGH (ref 70–99)

## 2023-05-28 MED ORDER — AMIODARONE HCL 200 MG PO TABS: ORAL_TABLET | ORAL | 1 refills | Status: DC

## 2023-05-28 MED ORDER — FUROSEMIDE 40 MG PO TABS
40.0000 mg | ORAL_TABLET | Freq: Once | ORAL | Status: AC
  Administered 2023-05-28: 40 mg via ORAL
  Filled 2023-05-28: qty 1

## 2023-05-28 MED ORDER — POTASSIUM CHLORIDE CRYS ER 10 MEQ PO TBCR: 10.0000 meq | EXTENDED_RELEASE_TABLET | ORAL | 0 refills | Status: DC

## 2023-05-28 MED ORDER — FUROSEMIDE 20 MG PO TABS: 20.0000 mg | ORAL_TABLET | Freq: Every day | ORAL | 0 refills | Status: DC

## 2023-05-28 MED ORDER — POTASSIUM CHLORIDE CRYS ER 10 MEQ PO TBCR
10.0000 meq | EXTENDED_RELEASE_TABLET | Freq: Every day | ORAL | Status: DC
  Administered 2023-05-28: 10 meq via ORAL
  Filled 2023-05-28: qty 1

## 2023-05-28 MED ORDER — FERROUS SULFATE 325 (65 FE) MG PO TBEC: 325.0000 mg | DELAYED_RELEASE_TABLET | Freq: Every day | ORAL | 0 refills | Status: AC

## 2023-05-28 MED ORDER — ASPIRIN 325 MG PO TBEC: 325.0000 mg | DELAYED_RELEASE_TABLET | Freq: Every day | ORAL | Status: DC

## 2023-05-28 MED ORDER — METOPROLOL TARTRATE 25 MG PO TABS: 25.0000 mg | ORAL_TABLET | Freq: Two times a day (BID) | ORAL | 1 refills | Status: DC

## 2023-05-28 MED ORDER — TRAMADOL HCL 50 MG PO TABS: 50.0000 mg | ORAL_TABLET | Freq: Two times a day (BID) | ORAL | 0 refills | Status: DC | PRN

## 2023-05-28 NOTE — TOC Transition Note (Signed)
Transition of Care (TOC) - CM/SW Discharge Note Donn Pierini RN, BSN Transitions of Care Unit 4E- RN Case Manager See Treatment Team for direct phone #   Patient Details  Name: DVONTAE RUAN MRN: [id number] Date of Birth: 08/21/1934  Transition of Care [facility]) CM/SW Contact:  Darrold Span, RN Phone Number: 05/28/2023, 9:14 AM   Clinical Narrative:    Pt stable for transition home, TOC Notified by Iantha Fallen liaison -following patient with MD office protocol referral prearranged for [facility] needs- orders have been placed for HHPT/OT. [facility] liaison notified for start of care.   No further TOC needs noted. Family to transport home.    Final next level of care: Home w Home Health Services Barriers to Discharge: Barriers Resolved   Patient Goals and CMS Choice   Choice offered to / list presented to : Patient (TCTS office referral)  Discharge Placement                 Home w/ [facility]        Discharge Plan and Services Additional resources added to the After Visit Summary for   In-house Referral: NA Discharge Planning Services: CM Consult Post Acute Care Choice: Home Health          DME Arranged: N/A DME Agency: NA       HH Arranged: PT, OT HH Agency: [facility] Date [facility] Agency Contacted: 05/28/23 Time HH Agency Contacted: 4098 Representative spoke with at [facility] Agency: Amy  Social Determinants of Health (SDOH) Interventions SDOH Screenings   Food Insecurity: No Food Insecurity (05/15/2023)  Housing: Low Risk  (05/15/2023)  Transportation Needs: No Transportation Needs (05/15/2023)  Utilities: Not At Risk (05/15/2023)  Tobacco Use: Medium Risk (05/23/2023)     Readmission Risk Interventions    05/28/2023    9:14 AM 05/23/2023    2:30 PM  Readmission Risk Prevention Plan  Transportation Screening  Complete  Home Care Screening Complete   Medication Review (RN CM) Complete   HRI or Home Care Consult  Complete  Social Work Consult for Recovery Care  Planning/Counseling  Complete  Palliative Care Screening  Not Applicable  Medication Review [employer])  Referral to Pharmacy

## 2023-05-28 NOTE — Progress Notes (Addendum)
      [address]       [place] [address]             [phone]        6 Days Post-Op Procedure(s) (LRB): CORONARY ARTERY BYPASS GRAFTING (CABG) X 2 WITH LEFT INTERNAL MAMMARY ARTERY HARVEST AND GREATER SAPHENOUS VEIN HARVESTED ENDOSCOPICALLY (N/A) TRANSESOPHAGEAL ECHOCARDIOGRAM (N/A)  Subjective: Patient eating breakfast. He wants to go home.  Objective: Vital signs in last 24 hours: Temp:  [97.6 F (36.4 C)-98.7 F (37.1 C)] 98.1 F (36.7 C) (06/09 0728) Pulse Rate:  [60-83] 67 (06/08 2339) Cardiac Rhythm: Normal sinus rhythm (06/08 2154) Resp:  [14-20] 18 (06/08 2339) BP: (91-127)/(54-72) 111/72 (06/08 2339) SpO2:  [91 %-96 %] 96 % (06/09 0728) Weight:  [83.5 kg] 83.5 kg (06/09 0611)  Pre op weight 84.1 kg Current Weight  05/28/23 83.5 kg      Intake/Output from previous day: 06/08 0701 - 06/09 0700 In: 240 [P.O.:240] Out: 300 [Urine:300]   Physical Exam:  Cardiovascular: RRR Pulmonary: Clear bilaterally Abdomen: Soft, non tender, bowel sounds present. Extremities: Mild bilateral lower extremity edema. Wounds: Clean and dry.  No erythema or signs of infection.  Lab Results: CBC: Recent Labs    05/26/23 0223 05/27/23 0147  WBC 6.4 7.1  HGB 9.2* 8.9*  HCT 28.4* 26.8*  PLT 106* 130*    BMET:  Recent Labs    05/27/23 0147 05/28/23 0219  NA 134* 135  K 3.6 4.0  CL 97* 99  CO2 28 24  GLUCOSE 152* 139*  BUN 26* 27*  CREATININE 1.46* 1.50*  CALCIUM 8.2* 9.1     PT/INR:  Lab Results  Component Value Date   INR 1.5 (H) 05/22/2023   INR 1.1 05/18/2023   INR 1.1 04/25/2023   ABG:  INR: Will add last result for INR, ABG once components are confirmed Will add last 4 CBG results once components are confirmed  Assessment/Plan:  1. CV - PAF. Had some a fib with CVR a few times yesterday. SR this am. On Amiodarone 400 mg bid and Lopressor 25 mg bid. As discussed with Dr. Cliffton Asters, no anticoagulation  2.  Pulmonary - On  room air . Will have nurse do walk test to make sure does not desat with ambulation. RUL biopsy showed SCC. Encourage incentive spirometer and flutter valve 3. Volume Overload - Will give Lasix 40 mg  this am and begin 20 mg daily after discharge  4.  Expected post op acute blood loss anemia - H and H this am slightly decreased to 8.9 and 26.8. ContinueTrigels Forte 5. CBGs 179/148/139. Pre op HGA1C 6.9. He will need to follow up with his medical doctor after discharge for new diabetes. 6. CKD (IIIb)-Creatinine this am  1.5. Creatinine upon admission was 1.39 7. Thrombocytopenia-platelets up to 130,000 8. As discussed  with Dr. Cliffton Asters, discharge once make sure no oxygen needed at discharge  Kalina Morabito M ZimmermanPA-C 8:10 AM

## 2023-05-28 NOTE — Progress Notes (Signed)
SATURATION QUALIFICATIONS: (This note is used to comply with regulatory documentation for home oxygen)  Patient Saturations on Room Air at Rest = 93%  Patient Saturations on Room Air while Ambulating = 90%   

## 2023-05-29 ENCOUNTER — Other Ambulatory Visit: Payer: Self-pay | Admitting: Thoracic Surgery (Cardiothoracic Vascular Surgery)

## 2023-05-29 DIAGNOSIS — Z951 Presence of aortocoronary bypass graft: Secondary | ICD-10-CM

## 2023-05-29 NOTE — Progress Notes (Signed)
CARDIAC REHAB PHASE I   Pt post OHS education provided via telephone. Spoke with son per pt request, as he is HOH over phone. Post OHS education including sternal precautions, heart healthy diet, IS use, exercise guidelines, risk factors, restrictions and CRP2 reviewed. All questions and concerns addressed. Referral sent to [facility] for CRP2. Written educational materials will be sent to address on file.  Woodroe Chen, RN BSN 05/29/2023 11:43 AM

## 2023-05-30 LAB — POCT I-STAT 7, (LYTES, BLD GAS, ICA,H+H)
Acid-Base Excess: 0 mmol/L (ref 0.0–2.0)
Acid-Base Excess: 1 mmol/L (ref 0.0–2.0)
Bicarbonate: 25.8 mmol/L (ref 20.0–28.0)
Bicarbonate: 27 mmol/L (ref 20.0–28.0)
Calcium, Ion: 1.25 mmol/L (ref 1.15–1.40)
Calcium, Ion: 1.23 mmol/L (ref 1.15–1.40)
HCT: 30 % — ABNORMAL LOW (ref 39.0–52.0)
HCT: 28 % — ABNORMAL LOW (ref 39.0–52.0)
Hemoglobin: 10.2 g/dL — ABNORMAL LOW (ref 13.0–17.0)
Hemoglobin: 9.5 g/dL — ABNORMAL LOW (ref 13.0–17.0)
O2 Saturation: 97 %
O2 Saturation: 94 %
Patient temperature: 37.8
Patient temperature: 37.9
Potassium: 4.9 mmol/L (ref 3.5–5.1)
Potassium: 5.5 mmol/L — ABNORMAL HIGH (ref 3.5–5.1)
Sodium: 139 mmol/L (ref 135–145)
Sodium: 137 mmol/L (ref 135–145)
TCO2: 27 mmol/L (ref 22–32)
TCO2: 28 mmol/L (ref 22–32)
pCO2 arterial: 43.3 mmHg (ref 32–48)
pCO2 arterial: 47.1 mmHg (ref 32–48)
pH, Arterial: 7.382 (ref 7.35–7.45)
pH, Arterial: 7.367 (ref 7.35–7.45)
pO2, Arterial: 97 mmHg (ref 83–108)
pO2, Arterial: 76 mmHg — ABNORMAL LOW (ref 83–108)

## 2023-06-01 ENCOUNTER — Other Ambulatory Visit: Payer: Self-pay

## 2023-06-01 ENCOUNTER — Telehealth (HOSPITAL_COMMUNITY): Payer: Self-pay

## 2023-06-01 LAB — CULTURE, FUNGUS WITHOUT SMEAR

## 2023-06-01 NOTE — Telephone Encounter (Signed)
Called and spoke with pt in regards to CR, pt stated he is not interested at this time.   Closed referral 

## 2023-06-01 NOTE — Progress Notes (Signed)
The proposed treatment discussed in conference is for discussion purpose only and is not a binding recommendation.  The patients have not been physically examined, or presented with their treatment options.  Therefore, final treatment plans cannot be decided.  

## 2023-06-01 NOTE — Telephone Encounter (Signed)
Referral recv'd and verified for MD signature. Follow up appointment is on 06/12/23. Insurance benefits and eligibility TBD.

## 2023-06-02 ENCOUNTER — Telehealth: Payer: Self-pay

## 2023-06-02 NOTE — Telephone Encounter (Signed)
Charrise, PT with [facility] contacted the office stating patient has been weighted daily, over the last three days he has been 187#, 180#, 188#. She states that on the second day of weighing his weight may not have been accurate due to the scale being analog and he was not able to fully stand still to get an accurate weight. She was concerned about weight increase of 8 pounds over night. She also states that he is not short of breath, he is able to complete PT with little tiredness, he does not have lower extremity edema, and he does not have a consistent cough. Advised he should weigh again tomorrow to see actual weight variations on a reliable scale. Give the office a call back if weight increases again overnight. She acknowledged receipt.

## 2023-06-03 LAB — CULTURE, FUNGUS WITHOUT SMEAR

## 2023-06-05 NOTE — Progress Notes (Addendum)
Cardiology Office Note:    Date:  06/12/2023   ID:  Gregory [name], DOB 1933-12-23, MRN [id number]  PCP:  Barbie Banner, MD   [facility] Providers Cardiologist:  Rollene Rotunda, MD Cardiology APP:  Marcelino Duster, PA { Referring MD: Barbie Banner, MD   Chief Complaint  Patient presents with   Follow-up  Post CABG  History of Present Illness:    Gregory [name] is a 89 y.o. 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    He was admitted in January 2024 with chest pain concerning for angina.  At that time heart catheterization on 12/26/2022 showed nonobstructive disease and diffuse calcified vessels.  CTA negative for PE but did show a AAA.  He was readmitted 12/29/2022 for similar presentation and repeat heart catheterization in the setting of NSTEMI revealed 75-80% lesion of the septal perforator just before an aneurysmal segment in the proximal LAD.  This was not ideal for PCI and continued medical therapy was recommended.  He was placed on 5 mg amlodipine and low-dose Imdur.  He has a history of remote tobacco use with a right upper lobe lung nodule.  He was being evaluated for possible lobectomy when he presented with unstable angina.  Heart catheterization showed two-vessel disease not amenable to PCI and was referred for evaluation by CT surgery.  He underwent CABG x 2 with LIMA-LAD, SVG-OM2. 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  He presents today for follow-up after CABG. he is accompanied by his son who helps with history.  Overall he is sounds like he is progressing well, but they have noticed increased fatigue over the last few days.  He did entertain guests last weekend.  He is euvolemic on exam today.   Past Medical History:  Diagnosis Date   Abdominal aneurysm  (HCC)    Anginal pain (HCC)    Coronary artery disease    Depression    History of kidney stones    Hyperlipidemia    Hypertension    Myocardial infarction [facility] 04/19/2023   NSTEMI   Stroke [facility]) 2004?   TIA    Past Surgical History:  Procedure Laterality Date   COLON SURGERY     polyps removed   COLONOSCOPY     CORONARY ANGIOGRAPHY N/A 12/29/2022   Procedure: CORONARY ANGIOGRAPHY;  Surgeon: Runell Gess, MD;  Location: [facility] INVASIVE CV LAB;  Service: Cardiovascular;  Laterality: N/A;   CORONARY ARTERY BYPASS GRAFT N/A 05/22/2023   Procedure: CORONARY ARTERY BYPASS GRAFTING (CABG) X 2 WITH LEFT INTERNAL MAMMARY ARTERY HARVEST AND GREATER SAPHENOUS VEIN HARVESTED ENDOSCOPICALLY;  Surgeon: [name], MD;  Location[facility] OR;  Service: Open Heart Surgery;  Laterality: N/A;   EYE SURGERY     cataract right   IR ANGIO INTRA EXTRACRAN SEL COM CAROTID INNOMINATE BILAT MOD SED  02/28/2018   IR ANGIO VERTEBRAL SEL SUBCLAVIAN INNOMINATE UNI R MOD SED  02/28/2018   IR RADIOLOGIST EVAL & MGMT  02/12/2021   LEFT HEART CATH AND CORONARY ANGIOGRAPHY N/A 12/26/2022   Procedure: LEFT HEART CATH AND CORONARY ANGIOGRAPHY;  Surgeon: Runell Gess, MD;  Location: [facility] INVASIVE CV LAB;  Service: Cardiovascular;  Laterality: N/A;   LEFT HEART CATH AND CORONARY ANGIOGRAPHY N/A 05/16/2023   Procedure: LEFT HEART CATH AND CORONARY  ANGIOGRAPHY;  Surgeon: Corky Crafts, MD;  Location: [facility] INVASIVE CV LAB;  Service: Cardiovascular;  Laterality: N/A;   OTHER SURGICAL HISTORY     Carotid    RADIOLOGY WITH ANESTHESIA N/A 02/28/2018   Procedure: STENTING;  Surgeon: Julieanne Cotton, MD;  Location: [facility] OR;  Service: Radiology;  Laterality: N/A;   TEE WITHOUT CARDIOVERSION N/A 05/22/2023   Procedure: TRANSESOPHAGEAL ECHOCARDIOGRAM;  Surgeon: [name], MD;  Location: [facility] OR;  Service: Open Heart Surgery;  Laterality: N/A;   VIDEO BRONCHOSCOPY WITH ENDOBRONCHIAL NAVIGATION N/A 04/27/2023    Procedure: VIDEO BRONCHOSCOPY WITH ENDOBRONCHIAL NAVIGATION W/ BIOPSIES;  Surgeon: [name], MD;  Location: [facility] OR;  Service: Thoracic;  Laterality: N/A;    Current Medications: Current Meds  Medication Sig   ALPRAZolam (XANAX) 0.5 MG tablet Take 0.5 mg by mouth at bedtime. Take 0.5 mg in the morning and 1 mg at bedtime   amiodarone (PACERONE) 200 MG tablet Take 200 mg two times daily for 10 days;then take 200 mg daily thereafter   aspirin EC 325 MG tablet Take 1 tablet (325 mg total) by mouth daily.   atorvastatin (LIPITOR) 80 MG tablet Take 1 tablet (80 mg total) by mouth daily.   ferrous sulfate 325 (65 FE) MG EC tablet Take 1 tablet (325 mg total) by mouth daily with breakfast.   furosemide (LASIX) 20 MG tablet Take 1 tablet (20 mg total) by mouth daily. For 5 days then stop   metoprolol tartrate (LOPRESSOR) 25 MG tablet Take 1 tablet (25 mg total) by mouth 2 (two) times daily.   mirtazapine (REMERON) 7.5 MG tablet TAKE 1 TABLET(7.5 MG) BY MOUTH AT BEDTIME (Patient taking differently: Take 7.5 mg by mouth at bedtime.)   Multiple Vitamins-Minerals (CENTRUM ADULTS PO) Take 1 tablet by mouth daily.    Multiple Vitamins-Minerals (ZINC PO) Take by mouth.   Omega-3 Fatty Acids (FISH OIL PO) Take 1 capsule by mouth daily.   potassium chloride SA (KLOR-CON M) 10 MEQ tablet Take 1 tablet (10 mEq total) by mouth every other day.   sertraline (ZOLOFT) 50 MG tablet TAKE 3 TABLETS(150 MG) BY MOUTH DAILY (Patient taking differently: Take 150 mg by mouth daily.)   tamsulosin (FLOMAX) 0.4 MG CAPS capsule Take 0.4 mg by mouth at bedtime.   traMADol (ULTRAM) 50 MG tablet Take 1 tablet (50 mg total) by mouth every 12 (twelve) hours as needed for moderate pain.   UNABLE TO FIND Take 1 tablet by mouth daily. Bone supplement     Allergies:   Patient has no known allergies.   Social History   Socioeconomic History   Marital status: Married    Spouse name: Not on file   Number of children: 2    Years of education: Not on file   Highest education level: Some college, no degree  Occupational History   Not on file  Tobacco Use   Smoking status: Former    Types: Cigarettes   Smokeless tobacco: Never  Vaping Use   Vaping Use: Never used  Substance and Sexual Activity   Alcohol use: No   Drug use: No   Sexual activity: Yes  Other Topics Concern   Not on file  Social History Narrative   Lives at home w/ his wife   Right-handed   Caffeine: occasional coffee and tea, decaf tea at home   Social Determinants of Health   Financial Resource Strain: Not on file  Food Insecurity: No Food Insecurity (05/15/2023)  Hunger Vital Sign    Worried About Running Out of Food in the Last Year: Never true    Ran Out of Food in the Last Year: Never true  Transportation Needs: No Transportation Needs (05/15/2023)   PRAPARE - Administrator, Civil Service (Medical): No    Lack of Transportation (Non-Medical): No  Physical Activity: Not on file  Stress: Not on file  Social Connections: Not on file     Family History: The patient's family history includes Cancer in his mother; Heart disease in his brother and mother. There is no history of Tremor.  ROS:   Please see the history of present illness.     All other systems reviewed and are negative.  EKGs/Labs/Other Studies Reviewed:    The following studies were reviewed today: Cardiac Studies & Procedures   CARDIAC CATHETERIZATION  CARDIAC CATHETERIZATION 05/16/2023  Narrative   Prox LAD to Mid LAD lesion is 50% stenosed.   Dist Cx lesion is 50% stenosed.   Ost LAD to Prox LAD lesion is 50% stenosed.   Dist LM lesion is 40% stenosed.   Prox LAD lesion is 80% stenosed.   Mid Cx lesion is 80% stenosed.  Right to left collaterals noted to the distal circumflex system   The left ventricular systolic function is normal.   LV end diastolic pressure is normal.   The left ventricular ejection fraction is 55-65% by visual  estimate.   There is no aortic valve stenosis.   JL4 needed for left main engagement.  Heavily calcified proximal to mid LAD.  Plaque extends from the distal left main into the ostial and proximal LAD.  Focal, 80% mid LAD lesion just prior to an ectatic segment in the mid LAD.  Treating this would require calcium modification.  Compared to January, the slow flow in the circumflex with right to left collaterals to the distal circumflex is a new finding.  Difficult to tell whether the hypodense lesion in the mid circumflex is thrombus or calcium.  Discussed with Dr. Herbie Baltimore.  Will restart IV heparin.  Stop Plavix.  Start Brilinta.  There is severe tortuosity in the proximal circumflex which would make any type of intervention in the mid circumflex difficult.  Would favor medical therapy at this point.  Findings Coronary Findings Diagnostic  Dominance: Right  Left Main Dist LM lesion is 40% stenosed. The lesion is calcified.  Left Anterior Descending Ost LAD to Prox LAD lesion is 50% stenosed. Prox LAD lesion is 80% stenosed. The lesion is severely calcified. Prox LAD to Mid LAD lesion is 50% stenosed. The lesion is calcified.  Left Circumflex Mid Cx lesion is 80% stenosed. hypodense Dist Cx lesion is 50% stenosed.  Right Coronary Artery The vessel exhibits minimal luminal irregularities.  Intervention  No interventions have been documented.   CARDIAC CATHETERIZATION  CARDIAC CATHETERIZATION 12/29/2022  Narrative Images from the original result were not included.    Prox LAD to Mid LAD lesion is 50% stenosed.   Dist Cx lesion is 50% stenosed.   Prox LAD lesion is 80% stenosed.  Gregory [name] is a 89 y.o. male   [id number] LOCATION:  FACILITY: [facility] PHYSICIAN: Nanetta Batty, M.D. 1934-06-28   DATE OF PROCEDURE:  12/29/2022  DATE OF DISCHARGE:     CARDIAC CATHETERIZATION    History obtained from chart review.89 y.o. 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  He was catheterized on 12/26/2022 by myself revealing diffuse coronary calcification with what appeared to be nonobstructive CAD.  He was discharged him and returned with recurrent chest pain and elevated enzymes.  He presents now for diagnostic coronary angiography.  Impression Mr. Sahlin was readmitted with a non-STEMI several days after discharge from a recent admission for a similar presentation at which time heart catheterization performed by myself on 12/26/2022 showed what I felt was nonobstructive disease with diffuse calcified vessels.  Heart cath today shows similar anatomy although focusing on the proximal LAD there does not seem to be a 75 to 80% lesion at a septal perforator just before an aneurysmal segment which is not ideal for percutaneous intervention.  I favor continued medical therapy.  The sheath was removed and a TR band was placed on the right wrist to achieve patent hemostasis.  The patient is already on DAPT with aspirin and clopidogrel.  I will restart heparin in 4 hours at which we will continue for 48 hours after which she will be discharged home.  If he has recurrent symptoms we may have to consider alternative revascularization strategies such as CABG (LIMA to LAD).  Nanetta Batty. MD, [facility] 12/29/2022 2:20 PM  Findings Coronary Findings Diagnostic  Dominance: Right  Left Anterior Descending Prox LAD lesion is 80% stenosed. Prox LAD to Mid LAD lesion is 50% stenosed. The lesion is calcified.  Left Circumflex Dist Cx lesion is 50% stenosed.  Intervention  No interventions have been documented.     ECHOCARDIOGRAM  ECHOCARDIOGRAM COMPLETE 05/16/2023  Narrative ECHOCARDIOGRAM REPORT    Patient Name:   Gregory [name] Genther Date of Exam: 05/16/2023 Medical Rec #:  [id number]       Height:        72.0 in Accession #:    [id number]      Weight:       187.2 lb Date of Birth:  [birth year]       BSA:          2.072 m Patient Age:    88 years        BP:           124/78 mmHg Patient Gender: M               HR:           64 bpm. Exam Location:  Inpatient  Procedure: 2D Echo, Color Doppler and Cardiac Doppler  Indications:    NSTEMI  History:        Patient has prior history of Echocardiogram examinations, most recent 12/24/2022. CAD; Risk Factors:Hypertension and Dyslipidemia.  Sonographer:    Irving Burton Senior RDCS Referring Phys: [id number] [facility] H HENDERSON   Sonographer Comments: Technically difficult due to poor echo windows. IMPRESSIONS   1. Left ventricular ejection fraction, by estimation, is 55 to 60%. The left ventricle has normal function. The left ventricle has no regional wall motion abnormalities. There is mild asymmetric left ventricular hypertrophy of the septal segment. Left ventricular diastolic parameters are consistent with Grade I diastolic dysfunction (impaired relaxation). 2. Right ventricular systolic function is normal. The right ventricular size is normal. Tricuspid regurgitation signal is inadequate for assessing PA pressure. 3. The mitral valve is grossly normal. Trivial mitral valve regurgitation. No evidence of mitral stenosis. 4. The aortic valve is calcified. There is mild calcification of the aortic valve. There is mild thickening of the aortic  valve. Aortic valve regurgitation is trivial. Aortic valve sclerosis/calcification is present, without any evidence of aortic stenosis. 5. Aortic dilatation noted. There is mild dilatation of the aortic root, measuring 40 mm. 6. The inferior vena cava is normal in size with greater than 50% respiratory variability, suggesting right atrial pressure of 3 mmHg.  Comparison(s): No significant change from prior study.  FINDINGS Left Ventricle: Left ventricular ejection fraction, by estimation, is 55 to 60%. The left  ventricle has normal function. The left ventricle has no regional wall motion abnormalities. The left ventricular internal cavity size was normal in size. There is mild asymmetric left ventricular hypertrophy of the septal segment. Left ventricular diastolic parameters are consistent with Grade I diastolic dysfunction (impaired relaxation).  Right Ventricle: The right ventricular size is normal. No increase in right ventricular wall thickness. Right ventricular systolic function is normal. Tricuspid regurgitation signal is inadequate for assessing PA pressure.  Left Atrium: Left atrial size was normal in size.  Right Atrium: Right atrial size was normal in size.  Pericardium: Trivial pericardial effusion is present. Presence of epicardial fat layer.  Mitral Valve: The mitral valve is grossly normal. Mild mitral annular calcification. Trivial mitral valve regurgitation. No evidence of mitral valve stenosis.  Tricuspid Valve: The tricuspid valve is grossly normal. Tricuspid valve regurgitation is trivial. No evidence of tricuspid stenosis.  Aortic Valve: The aortic valve is calcified. There is mild calcification of the aortic valve. There is mild thickening of the aortic valve. Aortic valve regurgitation is trivial. Aortic valve sclerosis/calcification is present, without any evidence of aortic stenosis. Aortic valve mean gradient measures 7.0 mmHg. Aortic valve peak gradient measures 12.7 mmHg. Aortic valve area, by VTI measures 2.92 cm.  Pulmonic Valve: The pulmonic valve was normal in structure. Pulmonic valve regurgitation is trivial. No evidence of pulmonic stenosis.  Aorta: Aortic dilatation noted. There is mild dilatation of the aortic root, measuring 40 mm.  Venous: The inferior vena cava is normal in size with greater than 50% respiratory variability, suggesting right atrial pressure of 3 mmHg.  IAS/Shunts: The atrial septum is grossly normal.   LEFT VENTRICLE PLAX 2D LVIDd:          4.60 cm   Diastology LVIDs:         3.50 cm   LV e' medial:    4.35 cm/s LV PW:         0.90 cm   LV E/e' medial:  9.5 LV IVS:        1.20 cm   LV e' lateral:   6.74 cm/s LVOT diam:     2.60 cm   LV E/e' lateral: 6.1 LV SV:         103 LV SV Index:   50 LVOT Area:     5.31 cm   RIGHT VENTRICLE RV S prime:     14.00 cm/s TAPSE (M-mode): 2.1 cm  LEFT ATRIUM             Index        RIGHT ATRIUM           Index LA diam:        3.10 cm 1.50 cm/m   RA Area:     18.10 cm LA Vol (A2C):   49.5 ml 23.90 ml/m  RA Volume:   45.00 ml  21.72 ml/m LA Vol (A4C):   53.5 ml 25.83 ml/m LA Biplane Vol: 53.3 ml 25.73 ml/m AORTIC VALVE AV Area (Vmax):    2.77  cm AV Area (Vmean):   3.03 cm AV Area (VTI):     2.92 cm AV Vmax:           178.00 cm/s AV Vmean:          126.000 cm/s AV VTI:            0.353 m AV Peak Grad:      12.7 mmHg AV Mean Grad:      7.0 mmHg LVOT Vmax:         92.80 cm/s LVOT Vmean:        71.900 cm/s LVOT VTI:          0.194 m LVOT/AV VTI ratio: 0.55  AORTA Ao Root diam: 4.00 cm Ao Asc diam:  3.90 cm  MITRAL VALVE MV Area (PHT): 2.11 cm    SHUNTS MV Decel Time: 359 msec    Systemic VTI:  0.19 m MV E velocity: 41.30 cm/s  Systemic Diam: 2.60 cm MV A velocity: 86.60 cm/s MV E/A ratio:  0.48  Lennie Odor MD Electronically signed by Lennie Odor MD Signature Date/Time: 05/16/2023/2:36:15 PM    Final   TEE  ECHO INTRAOPERATIVE TEE 05/22/2023  Narrative *INTRAOPERATIVE TRANSESOPHAGEAL REPORT *    Patient Name:   Gregory [name] Dineen Date of Exam: 05/22/2023 Medical Rec #:  [id number]       Height:       72.0 in Accession #:    [id number]      Weight:       187.2 lb Date of Birth:  20-Apr-1934       BSA:          2.07 m Patient Age:    88 years        BP:           126/62 mmHg Patient Gender: M               HR:           52 bpm. Exam Location:  Anesthesiology  Transesophogeal exam was perform intraoperatively during surgical procedure. Patient was  closely monitored under general anesthesia during the entirety of examination.  Indications:     CABG Sonographer:     Darlys Gales Performing Phys: [id number] STEVEN C HENDRICKSON  Complications: No known complications during this procedure. POST-OP IMPRESSIONS _ Left Ventricle: The left ventricle is unchanged from pre-bypass. _ Right Ventricle: The right ventricle appears unchanged from pre-bypass. _ Aorta: The aorta appears unchanged from pre-bypass. _ Left Atrial Appendage: The left atrial appendage appears unchanged from pre-bypass. _ Aortic Valve: The aortic valve appears unchanged from pre-bypass. Moderate stenosis present. _ Mitral Valve: The mitral valve appears unchanged from pre-bypass. _ Tricuspid Valve: The tricuspid valve appears unchanged from pre-bypass. _ Pulmonic Valve: The pulmonic valve appears unchanged from pre-bypass. _ Interatrial Septum: The interatrial septum appears unchanged from pre-bypass.  PRE-OP FINDINGS Left Ventricle: The left ventricle has normal systolic function, with an ejection fraction of 55-60%. The cavity size was normal. There is mild concentric left ventricular hypertrophy.   Right Ventricle: The right ventricle has normal systolic function. The cavity was normal. There is no increase in right ventricular wall thickness.  Left Atrium: Left atrial size was normal in size. No left atrial/left atrial appendage thrombus was detected.  Right Atrium: Right atrial size was normal in size.  Interatrial Septum: No atrial level shunt detected by color flow Doppler.  Pericardium: There is no evidence of pericardial effusion.  Mitral Valve: The mitral  valve is normal in structure. Mitral valve regurgitation is mild by color flow Doppler.  Tricuspid Valve: The tricuspid valve was normal in structure. Tricuspid valve regurgitation is mild by color flow Doppler.  Aortic Valve: The aortic valve is tricuspid Aortic valve regurgitation is trivial by color  flow Doppler. There is moderate stenosis of the aortic valve. There is moderate thickening and moderate calcification present on the aortic valve non-coronary and right coronary cusps with moderately decreased mobility.  Pulmonic Valve: The pulmonic valve was normal in structure. Pulmonic valve regurgitation is trivial by color flow Doppler.   Aorta: The aortic root and ascending aorta are normal in size and structure. The aortic arch was not well visualized. There is evidence of plaque in the descending aorta; Grade III, measuring 3-71mm in size.   Lewie Loron MD Electronically signed by Lewie Loron MD Signature Date/Time: 05/22/2023/5:45:23 PM    Final             EKG:  EKG is  ordered today.        Recent Labs: 12/24/2022: B Natriuretic Peptide 326.2 05/22/2023: ALT 30 05/25/2023: Magnesium 2.0 05/27/2023: Hemoglobin 8.9; Platelets 130 05/28/2023: BUN 27; Creatinine, Ser 1.50; Potassium 4.0; Sodium 135  Recent Lipid Panel    Component Value Date/Time   CHOL 104 05/16/2023 0327   TRIG 133 05/16/2023 0327   HDL 33 (L) 05/16/2023 0327   CHOLHDL 3.2 05/16/2023 0327   VLDL 27 05/16/2023 0327   LDLCALC 44 05/16/2023 0327     Risk Assessment/Calculations:    CHA2DS2-VASc Score = 6   This indicates a 9.7% annual risk of stroke. The patient's score is based upon: CHF History: 0 HTN History: 1 Diabetes History: 0 Stroke History: 2 Vascular Disease History: 1 Age Score: 2 Gender Score: 0             Physical Exam:    VS:  BP 124/84   Pulse 63   Ht 6' (1.829 m)   Wt 182 lb (82.6 kg)   SpO2 96%   BMI 24.68 kg/m     Wt Readings from Last 3 Encounters:  06/12/23 182 lb (82.6 kg)  05/28/23 184 lb (83.5 kg)  05/15/23 187 lb 3.2 oz (84.9 kg)     GEN:  Well nourished, well developed in no acute distress HEENT: Normal NECK: No JVD; No carotid bruits LYMPHATICS: No lymphadenopathy CARDIAC: RRR, no murmurs, rubs, gallops RESPIRATORY:  Clear to auscultation  without rales, wheezing or rhonchi  ABDOMEN: Soft, non-tender, non-distended MUSCULOSKELETAL:  No edema; No deformity  SKIN: Warm and dry NEUROLOGIC:  Alert and oriented x 3 PSYCHIATRIC:  Normal affect  Sternotomy and drain sites healing well, no signs of infection  ASSESSMENT:    1. Coronary artery disease involving native coronary artery of native heart with unstable angina pectoris (HCC)   2. 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   3. S/P CABG x 2   4. Primary hypertension   5. Hyperlipidemia with target LDL less than 70   6. PAF (paroxysmal atrial fibrillation) (HCC)   7. Abdominal aortic aneurysm (AAA) without rupture, unspecified part (HCC)    PLAN:    In order of problems listed above:  CAD s/p CABG x 2 LIMA-LAD, SVG-OM2 Currently on aspirin 325 mg Previously on DAPT with aspirin and Plavix Will defer to CT surgery for reduction aspirin to 81 mg Given history of CVA, could make the argument for Plavix monotherapy He was discharged without Imdur, Ranexa, amlodipine -  Overall I think he is progressing well and will likely have some lower energy days.  I do not see a cardiac reason for fatigue currently -He remains on potassium supplementation without Lasix, I will collect a BMP today to decide if he needs to continue potassium as he had hyperkalemia 2 weeks ago   Hypertension Prior to CABG was on 30 mg Imdur, 5 mg amlodipine Continue 25 mg metoprolol tartrate twice daily, -They will keep a blood pressure log 3 times weekly   Hyperlipidemia with LDL goal less than 70 05/16/2023: Cholesterol 104; HDL 33; LDL Cholesterol 44; Triglycerides 133; VLDL 27 Well-controlled   PAF Noted postoperatively after CABG 200 mg amiodarone daily -Plan to continue amiodarone and beta-blocker, will attempt to wean amiodarone in 3 months given underlying lung cancer -Given no recurrence, he was not anticoagulated prior to discharge   CVA Currently on 325 mg  aspirin   AAA 4 x 3.6 cm by CT 12/23/2022 Will need surveillance     Follow up in 2-3 moths.      Medication Adjustments/Labs and Tests Ordered: Current medicines are reviewed at length with the patient today.  Concerns regarding medicines are outlined above.  Orders Placed This Encounter  Procedures   Basic metabolic panel   EKG 12-Lead   No orders of the defined types were placed in this encounter.   Patient Instructions  Medication Instructions:  The current medical regimen is effective;  continue present plan and medications as directed. Please refer to the Current Medication list given to you today.  *If you need a refill on your cardiac medications before your next appointment, please call your pharmacy*  Lab Work: BMET TODAY If you have labs (blood work) drawn today and your tests are completely normal, you will receive your results only by:  MyChart Message (if you have MyChart) OR  A paper copy in the mail If you have any lab test that is abnormal or we need to change your treatment, we will call you to review the results.  Follow-Up: At [facility], you and your health needs are our priority.  As part of our continuing mission to provide you with exceptional heart care, we have created designated Provider Care Teams.  These Care Teams include your primary Cardiologist (physician) and Advanced Practice Providers (APPs -  Physician Assistants and Nurse Practitioners) who all work together to provide you with the care you need, when you need it.  Your next appointment:   2-3 month(s)  Provider:   Rollene Rotunda, MD  or Micah Flesher, PA-C        Other Instructions    Signed, Roe Rutherford Rehana Uncapher, [place]  06/12/2023 4:09 PM    [facility]

## 2023-06-06 ENCOUNTER — Ambulatory Visit: Payer: Self-pay

## 2023-06-06 DIAGNOSIS — Z951 Presence of aortocoronary bypass graft: Secondary | ICD-10-CM

## 2023-06-06 DIAGNOSIS — Z4802 Encounter for removal of sutures: Secondary | ICD-10-CM

## 2023-06-06 NOTE — Progress Notes (Signed)
Patient arrived for nurse visit to remove suture/staples post- procedure CABG x2 with Dr. Dorris Fetch 05/22/23.  Sutures removed with no signs/ symptoms of infection noted.  Patient tolerated procedure well.  One incision was well approximated. The other incision did have a scab present that did ooze old blood when the suture was removed. Advised that if the scab was to come off he may have some drainage and may open. Advised to keep it clean and dry, and if drainage does happen he could cover it with a bandage. Patient/ family instructed to keep the incision sites clean and dry.  Patient/ family acknowledged instructions given.

## 2023-06-12 ENCOUNTER — Encounter: Payer: Self-pay | Admitting: Physician Assistant

## 2023-06-12 ENCOUNTER — Ambulatory Visit: Payer: PPO | Attending: Physician Assistant | Admitting: Physician Assistant

## 2023-06-12 VITALS — BP 124/84 | HR 63 | Ht 72.0 in | Wt 182.0 lb

## 2023-06-12 DIAGNOSIS — Z8673 Personal history of transient ischemic attack (TIA), and cerebral infarction without residual deficits: Secondary | ICD-10-CM | POA: Diagnosis not present

## 2023-06-12 DIAGNOSIS — Z951 Presence of aortocoronary bypass graft: Secondary | ICD-10-CM | POA: Diagnosis not present

## 2023-06-12 DIAGNOSIS — I10 Essential (primary) hypertension: Secondary | ICD-10-CM

## 2023-06-12 DIAGNOSIS — I480 Paroxysmal atrial fibrillation: Secondary | ICD-10-CM

## 2023-06-12 DIAGNOSIS — E785 Hyperlipidemia, unspecified: Secondary | ICD-10-CM

## 2023-06-12 DIAGNOSIS — I714 Abdominal aortic aneurysm, without rupture, unspecified: Secondary | ICD-10-CM

## 2023-06-12 DIAGNOSIS — I25110 Atherosclerotic heart disease of native coronary artery with unstable angina pectoris: Secondary | ICD-10-CM | POA: Diagnosis not present

## 2023-06-12 NOTE — Patient Instructions (Signed)
Medication Instructions:  The current medical regimen is effective;  continue present plan and medications as directed. Please refer to the Current Medication list given to you today.  *If you need a refill on your cardiac medications before your next appointment, please call your pharmacy*  Lab Work: BMET TODAY If you have labs (blood work) drawn today and your tests are completely normal, you will receive your results only by:  MyChart Message (if you have MyChart) OR  A paper copy in the mail If you have any lab test that is abnormal or we need to change your treatment, we will call you to review the results.  Follow-Up: At [facility], you and your health needs are our priority.  As part of our continuing mission to provide you with exceptional heart care, we have created designated Provider Care Teams.  These Care Teams include your primary Cardiologist (physician) and Advanced Practice Providers (APPs -  Physician Assistants and Nurse Practitioners) who all work together to provide you with the care you need, when you need it.  Your next appointment:   2-3 month(s)  Provider:   Rollene Rotunda, MD  or Micah Flesher, PA-C        Other Instructions

## 2023-06-13 LAB — BASIC METABOLIC PANEL
BUN/Creatinine Ratio: 11 (ref 10–24)
BUN: 14 mg/dL (ref 8–27)
CO2: 22 mmol/L (ref 20–29)
Calcium: 10 mg/dL (ref 8.6–10.2)
Chloride: 101 mmol/L (ref 96–106)
Creatinine, Ser: 1.23 mg/dL (ref 0.76–1.27)
Glucose: 96 mg/dL (ref 70–99)
Potassium: 5 mmol/L (ref 3.5–5.2)
Sodium: 139 mmol/L (ref 134–144)
eGFR: 57 mL/min/{1.73_m2} — ABNORMAL LOW (ref >59)

## 2023-06-13 LAB — CULTURE, FUNGUS WITHOUT SMEAR

## 2023-06-14 ENCOUNTER — Telehealth: Payer: Self-pay

## 2023-06-14 NOTE — Telephone Encounter (Signed)
Spoke with pt. Pt was notified of results. Pt will d/c Potassium supplement at this time. Pt will continue current medication and f/u as planned.

## 2023-06-15 ENCOUNTER — Other Ambulatory Visit: Payer: Self-pay | Admitting: Psychiatry

## 2023-06-15 DIAGNOSIS — F3181 Bipolar II disorder: Secondary | ICD-10-CM

## 2023-06-15 LAB — ACID FAST CULTURE WITH REFLEXED SENSITIVITIES (MYCOBACTERIA): Acid Fast Culture: NEGATIVE

## 2023-06-15 NOTE — Telephone Encounter (Signed)
Lvm for patient to call and schedule 

## 2023-06-15 NOTE — Telephone Encounter (Signed)
Please call to schedule an appt, due next month.  

## 2023-06-15 NOTE — Progress Notes (Addendum)
[address]       Gregory [name] [id number]             [phone]  HPI: Gregory [name] returns for a scheduled follow-up visit after recent coronary bypass grafting.  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   CT of the chest showed a 1.5 cm spiculated right upper lobe lung nodule and it was hypermetabolic on PET scan. Dr. Dorris Fetch did a navigational bronchoscopy biopsy, which was not definitive either way.   Gregory [name] was scheduled for robotic surgery for the lung nodule, but it was canceled due to admission for anginal pain. Dr. Dorris Fetch was then consulted for 2 vessel coronary artery disease. Patient underwent a CABG x 2 (LIMA to LAD and SVG to OM2) and needle biopsy of RUL nodule on 05/22/2023 by Dr. Dorris Fetch. Pathology showed squamous cell carcinoma of right upper lobe.   He had some atrial fibrillation postoperatively.  He converted to sinus rhythm with amiodarone.  He did not require anticoagulation.  He feels well.  He has been walking on a regular basis.  Denies any incisional pain.  Anxious to resume activities.  He has some confusion about the diagnosis of the lung nodule.  Past Medical History:  Diagnosis Date   Abdominal aneurysm (HCC)    Anginal pain (HCC)    Coronary artery disease    Depression    History of kidney stones    Hyperlipidemia    Hypertension    Myocardial infarction [facility]) 04/19/2023   NSTEMI   Stroke [facility]) 2004?   TIA     Current Outpatient Medications  Medication Sig Dispense Refill   sertraline (ZOLOFT) 50 MG tablet TAKE 3 TABLETS(150 MG) BY MOUTH DAILY 270 tablet 0   ALPRAZolam (XANAX) 0.5 MG tablet Take 0.5 mg by mouth at bedtime. Take 0.5  mg in the morning and 1 mg at bedtime     amiodarone (PACERONE) 200 MG tablet Take 200 mg two times daily for 10 days;then take 200 mg daily thereafter 60 tablet 1   aspirin EC 325 MG tablet Take 1 tablet (325 mg total) by mouth daily.     atorvastatin (LIPITOR) 80 MG tablet Take 1 tablet (80 mg total) by mouth daily. 90 tablet 3   ferrous sulfate 325 (65 FE) MG EC tablet Take 1 tablet (325 mg total) by mouth daily with breakfast. 60 tablet 0   furosemide (LASIX) 20 MG tablet Take 1 tablet (20 mg total) by mouth daily. For 5 days then stop 5 tablet 0   metoprolol tartrate (LOPRESSOR) 25 MG tablet Take 1 tablet (25 mg total) by mouth 2 (two) times daily. 60 tablet 1   mirtazapine (REMERON) 7.5 MG tablet TAKE 1 TABLET(7.5 MG) BY MOUTH AT BEDTIME (Patient taking differently: Take 7.5 mg by mouth at bedtime.) 90 tablet 1   Multiple Vitamins-Minerals (CENTRUM ADULTS PO) Take 1 tablet by mouth daily.      Multiple Vitamins-Minerals (ZINC PO) Take by mouth.     Omega-3 Fatty Acids (FISH OIL PO) Take 1 capsule by mouth daily.     potassium chloride SA (KLOR-CON M) 10 MEQ tablet Take 1 tablet (10 mEq total) by mouth every other day. 3 tablet 0  tamsulosin (FLOMAX) 0.4 MG CAPS capsule Take 0.4 mg by mouth at bedtime.     traMADol (ULTRAM) 50 MG tablet Take 1 tablet (50 mg total) by mouth every 12 (twelve) hours as needed for moderate pain. 30 tablet 0   UNABLE TO FIND Take 1 tablet by mouth daily. Bone supplement    Vital Signs: BP 97/60   Pulse (!) 57   Resp 20   Ht 6' (1.829 m)   Wt 185 lb (83.9 kg)   SpO2 96% Comment: RA  BMI 25.09 kg/m    Physical Exam: 89 year old man in no acute distress Alert and oriented x 3 with no focal deficits Lungs clear bilaterally Cardiac regular rate and rhythm Sternum stable, incision clean dry and intact No peripheral edema  Diagnostic Tests: I personally reviewed the chest x-ray images.  There is a small left effusion.  Right upper lobe pulmonary nodule  noted.  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   Two-vessel coronary disease-status post coronary bypass grafting with no recurrent angina.  Status post coronary bypass grafting-about a month out from surgery.  Should not lift anything over 10 pounds for an additional 2 weeks.  Can gradually increase his activities otherwise.  He wants to go on a cruise to [place] in late July/early August.  I told him he could do that if he feels up to it.  Clinical stage Ia (T1, N0) squamous cell carcinoma of the lung-remote history of tobacco abuse.  I discussed the potential treatment options with him including surgical resection and stereotactic radiation.  He was scheduled for surgery before presenting with worsening angina requiring coronary bypass grafting.  I do think he is a surgical candidate but given his recent surgery could consider stereotactic radiation.  Previously we did not have a definitive diagnosis.  I recommended he see radiation oncology and discussed with them.  I will then plan to see him back in about a month after he returns from [place] and we can make a definitive decision regarding surgery versus radiation.   Gregory Balls, PA-C [facility] [phone]  The above note is mine and represents my physical exam and assessment and plan.  Salvatore Decent Dorris Fetch, MD [facility] [phone]

## 2023-06-19 ENCOUNTER — Other Ambulatory Visit: Payer: Self-pay | Admitting: Physician Assistant

## 2023-06-20 ENCOUNTER — Ambulatory Visit (INDEPENDENT_AMBULATORY_CARE_PROVIDER_SITE_OTHER): Payer: Self-pay | Admitting: Thoracic Surgery (Cardiothoracic Vascular Surgery)

## 2023-06-20 ENCOUNTER — Encounter: Payer: Self-pay | Admitting: Thoracic Surgery (Cardiothoracic Vascular Surgery)

## 2023-06-20 ENCOUNTER — Other Ambulatory Visit: Payer: Self-pay | Admitting: Thoracic Surgery (Cardiothoracic Vascular Surgery)

## 2023-06-20 ENCOUNTER — Ambulatory Visit
Admission: RE | Admit: 2023-06-20 | Discharge: 2023-06-20 | Disposition: A | Discharge status: Home / Self Care | Payer: PPO | Source: Ambulatory Visit | Attending: Thoracic Surgery (Cardiothoracic Vascular Surgery) | Admitting: Thoracic Surgery (Cardiothoracic Vascular Surgery)

## 2023-06-20 VITALS — BP 97/60 | HR 57 | Resp 20 | Ht 72.0 in | Wt 185.0 lb

## 2023-06-20 DIAGNOSIS — Z9889 Other specified postprocedural states: Secondary | ICD-10-CM

## 2023-06-20 DIAGNOSIS — Z951 Presence of aortocoronary bypass graft: Secondary | ICD-10-CM

## 2023-06-20 DIAGNOSIS — R911 Solitary pulmonary nodule: Secondary | ICD-10-CM

## 2023-06-20 DIAGNOSIS — I2510 Atherosclerotic heart disease of native coronary artery without angina pectoris: Secondary | ICD-10-CM

## 2023-06-26 NOTE — Progress Notes (Signed)
Thoracic Location of Tumor / Histology: Right Upper Lobe Lung  Patient presented with chest pain.  Imaging noted a nodule in the right upper lobe lung.    Biopsies of Right Upper Lobe Lung 05/22/2023   Tobacco/Marijuana/Snuff/ETOH use: Former Smoker.   Past/Anticipated interventions by cardiothoracic surgery, if any:  Dr. Dorris Fetch -Patient was originally scheduled for a right upper lobe wedge resection but was admitted with unstable angina. -CABG x2 and needle biopsy of RUL nodule 05/22/2023    Past/Anticipated interventions by medical oncology, if any:    Signs/Symptoms Weight changes, if any: No Respiratory complaints, if any: No.   Hemoptysis, if any: He reports occasional productive cough with clear phlegm.  Denies hemoptysis. Pain issues, if any:  Denies chest pain, pressure, or tightness.  SAFETY ISSUES: Prior radiation? No Pacemaker/ICD?  No Possible current pregnancy? N/a Is the patient on methotrexate? No  Current Complaints / other details:   -He has had double bypass surgery recently.

## 2023-06-27 ENCOUNTER — Encounter: Payer: Self-pay | Admitting: Radiation Oncology

## 2023-06-27 ENCOUNTER — Ambulatory Visit
Admission: RE | Admit: 2023-06-27 | Discharge: 2023-06-27 | Disposition: A | Discharge status: Home / Self Care | Payer: 59 | Source: Ambulatory Visit | Attending: Radiation Oncology | Admitting: Radiation Oncology

## 2023-06-27 VITALS — Ht 72.0 in | Wt 185.0 lb

## 2023-06-27 DIAGNOSIS — C3411 Malignant neoplasm of upper lobe, right bronchus or lung: Secondary | ICD-10-CM

## 2023-06-27 NOTE — Progress Notes (Signed)
Radiation Oncology         ([phone]) [phone] ________________________________  Name: Gregory [name]        MRN: [id number]  Date of Service: 06/27/2023 DOB: Sep 13, 1934  CC:Barbie Banner, MD  [name], *     REFERRING PHYSICIAN: [name], *   DIAGNOSIS: The encounter diagnosis was Malignant neoplasm of right upper lobe of lung (HCC).   HISTORY OF PRESENT ILLNESS: Gregory [name] is a 89 y.o. male seen at the request of Dr. Dorris Fetch for non-small cell lung cancer. He originally presented with chest pain in January of this year. Cardiac catheterization showed mild CAD. Chest CT showed a 1.5 cm spiculated RUL lung nodule. He went home, but returned a day later with recurrent chest pain. He was found to have non-ST elevation MI. Repeat catheterization showed a 70-80% LAD lesion. He was treated medically. Follow-up chest CT on 03/20/23 showed the nodule increased to 1.8 cm in size.   PET on 04/25/23 showed a 1.2 cm hypermetabolic lung mass with no evidence of lymphadenopathy or metastatic disease.   Dr. Dorris Fetch was evaluating patient for possible lobectomy of the lung nodule, but he presented with hospitalized with unstable angina in the interim. Patient underwent a CABG and needle biopsy of the right upper lobe nodule on 05/22/23. Pathology revealed squamous cell carcinoma. Given everything he had been through surgically, Dr. Dorris Fetch thought radiation would be a good option. He kindly referred him today to discuss radiation therapy as a potential treatment. ***    PREVIOUS RADIATION THERAPY: No   PAST MEDICAL HISTORY:  Past Medical History:  Diagnosis Date   Abdominal aneurysm (HCC)    Anginal pain (HCC)    Coronary artery disease    Depression    History of kidney stones    Hyperlipidemia    Hypertension    Myocardial infarction [facility]) 04/19/2023   NSTEMI   Stroke [facility]) 2004?   TIA       PAST SURGICAL HISTORY: Past Surgical History:  Procedure  Laterality Date   COLON SURGERY     polyps removed   COLONOSCOPY     CORONARY ANGIOGRAPHY N/A 12/29/2022   Procedure: CORONARY ANGIOGRAPHY;  Surgeon: Runell Gess, MD;  Location: [facility] INVASIVE CV LAB;  Service: Cardiovascular;  Laterality: N/A;   CORONARY ARTERY BYPASS GRAFT N/A 05/22/2023   Procedure: CORONARY ARTERY BYPASS GRAFTING (CABG) X 2 WITH LEFT INTERNAL MAMMARY ARTERY HARVEST AND GREATER SAPHENOUS VEIN HARVESTED ENDOSCOPICALLY;  Surgeon: [name], MD;  Location: [facility] OR;  Service: Open Heart Surgery;  Laterality: N/A;   EYE SURGERY     cataract right   IR ANGIO INTRA EXTRACRAN SEL COM CAROTID INNOMINATE BILAT MOD SED  02/28/2018   IR ANGIO VERTEBRAL SEL SUBCLAVIAN INNOMINATE UNI R MOD SED  02/28/2018   IR RADIOLOGIST EVAL & MGMT  02/12/2021   LEFT HEART CATH AND CORONARY ANGIOGRAPHY N/A 12/26/2022   Procedure: LEFT HEART CATH AND CORONARY ANGIOGRAPHY;  Surgeon: Runell Gess, MD;  Location: [facility] INVASIVE CV LAB;  Service: Cardiovascular;  Laterality: N/A;   LEFT HEART CATH AND CORONARY ANGIOGRAPHY N/A 05/16/2023   Procedure: LEFT HEART CATH AND CORONARY ANGIOGRAPHY;  Surgeon: Corky Crafts, MD;  Location: [facility] INVASIVE CV LAB;  Service: Cardiovascular;  Laterality: N/A;   OTHER SURGICAL HISTORY     Carotid    RADIOLOGY WITH ANESTHESIA N/A 02/28/2018   Procedure: STENTING;  Surgeon: Julieanne Cotton, MD;  Location: [facility] OR;  Service: Radiology;  Laterality: N/A;   TEE WITHOUT CARDIOVERSION N/A 05/22/2023   Procedure: TRANSESOPHAGEAL ECHOCARDIOGRAM;  Surgeon: [name], MD;  Location: [facility] OR;  Service: Open Heart Surgery;  Laterality: N/A;   VIDEO BRONCHOSCOPY WITH ENDOBRONCHIAL NAVIGATION N/A 04/27/2023   Procedure: VIDEO BRONCHOSCOPY WITH ENDOBRONCHIAL NAVIGATION W/ BIOPSIES;  Surgeon: [name], MD;  Location: [facility] OR;  Service: Thoracic;  Laterality: N/A;     FAMILY HISTORY:  Family History  Problem Relation Age of Onset   Cancer Mother         breast   Heart disease Mother    Heart disease Brother    Tremor Neg Hx      SOCIAL HISTORY:  reports that he has quit smoking. His smoking use included cigarettes. He has never used smokeless tobacco. He reports that he does not drink alcohol and does not use drugs.   ALLERGIES: Patient has no known allergies.   MEDICATIONS:  Current Outpatient Medications  Medication Sig Dispense Refill   ALPRAZolam (XANAX) 0.5 MG tablet Take 0.5 mg by mouth at bedtime. Take 0.5 mg in the morning and 1 mg at bedtime     amiodarone (PACERONE) 200 MG tablet Take 200 mg two times daily for 10 days;then take 200 mg daily thereafter 60 tablet 1   aspirin EC 325 MG tablet Take 1 tablet (325 mg total) by mouth daily.     atorvastatin (LIPITOR) 80 MG tablet Take 1 tablet (80 mg total) by mouth daily. 90 tablet 3   ferrous sulfate 325 (65 FE) MG EC tablet Take 1 tablet (325 mg total) by mouth daily with breakfast. 60 tablet 0   furosemide (LASIX) 20 MG tablet Take 1 tablet (20 mg total) by mouth daily. For 5 days then stop 5 tablet 0   metoprolol tartrate (LOPRESSOR) 25 MG tablet Take 1 tablet (25 mg total) by mouth 2 (two) times daily. 60 tablet 1   mirtazapine (REMERON) 7.5 MG tablet TAKE 1 TABLET(7.5 MG) BY MOUTH AT BEDTIME (Patient taking differently: Take 7.5 mg by mouth at bedtime.) 90 tablet 1   Multiple Vitamins-Minerals (CENTRUM ADULTS PO) Take 1 tablet by mouth daily.      Multiple Vitamins-Minerals (ZINC PO) Take by mouth.     Omega-3 Fatty Acids (FISH OIL PO) Take 1 capsule by mouth daily.     potassium chloride SA (KLOR-CON M) 10 MEQ tablet Take 1 tablet (10 mEq total) by mouth every other day. 3 tablet 0   sertraline (ZOLOFT) 50 MG tablet TAKE 3 TABLETS(150 MG) BY MOUTH DAILY 270 tablet 0   tamsulosin (FLOMAX) 0.4 MG CAPS capsule Take 0.4 mg by mouth at bedtime.     traMADol (ULTRAM) 50 MG tablet Take 1 tablet (50 mg total) by mouth every 12 (twelve) hours as needed for moderate pain. 30 tablet 0    UNABLE TO FIND Take 1 tablet by mouth daily. Bone supplement     No current facility-administered medications for this encounter.     REVIEW OF SYSTEMS: On review of systems, the patient reports that he is doing well overall. he denies any chest pain, shortness of breath, fevers, chills, night sweats, unintended weight changes. He denies any new musculoskeletal or joint aches or pains. A complete review of systems is obtained and is otherwise negative.     PHYSICAL EXAM:  Wt Readings from Last 3 Encounters:  06/27/23 185 lb (83.9 kg)  06/20/23 185 lb (83.9 kg)  06/12/23 182 lb (82.6 kg)   Temp  Readings from Last 3 Encounters:  05/28/23 98.1 F (36.7 C) (Oral)  05/18/23 97.8 F (36.6 C) (Oral)  04/27/23 (!) 97.5 F (36.4 C)   BP Readings from Last 3 Encounters:  06/20/23 97/60  06/12/23 124/84  05/28/23 117/68   Pulse Readings from Last 3 Encounters:  06/20/23 (!) 57  06/12/23 63  05/28/23 88   Pain Assessment Pain Score: 0-No pain/10  In general this is a well appearing male in no acute distress. He's alert and oriented x4 and appropriate throughout the examination. Cardiopulmonary assessment is negative for acute distress and he exhibits normal effort.     ECOG = 0  0 - Asymptomatic (Fully active, able to carry on all predisease activities without restriction)  1 - Symptomatic but completely ambulatory (Restricted in physically strenuous activity but ambulatory and able to carry out work of a light or sedentary nature. For example, light housework, office work)  2 - Symptomatic, <50% in bed during the day (Ambulatory and capable of all self care but unable to carry out any work activities. Up and about more than 50% of waking hours)  3 - Symptomatic, >50% in bed, but not bedbound (Capable of only limited self-care, confined to bed or chair 50% or more of waking hours)  4 - Bedbound (Completely disabled. Cannot carry on any self-care. Totally confined to bed or  chair)  5 - Death   Santiago Glad MM, Creech RH, Tormey DC, et al. [phone]). "Toxicity and response criteria of the [facility]". Am. Evlyn Clines. Oncol. 5 (6): 649-55    LABORATORY DATA:  Lab Results  Component Value Date   WBC 7.1 05/27/2023   HGB 8.9 (L) 05/27/2023   HCT 26.8 (L) 05/27/2023   MCV 91.8 05/27/2023   PLT 130 (L) 05/27/2023   Lab Results  Component Value Date   NA 139 06/12/2023   K 5.0 06/12/2023   CL 101 06/12/2023   CO2 22 06/12/2023   Lab Results  Component Value Date   ALT 30 05/22/2023   AST 29 05/22/2023   ALKPHOS 102 05/22/2023   BILITOT 1.2 05/22/2023      RADIOGRAPHY: DG Chest 2 View  Result Date: 06/25/2023 CLINICAL DATA:  Post CABG EXAM: CHEST - 2 VIEW COMPARISON:  05/27/2023 FINDINGS: Prior CABG. Heart is normal size. Mediastinal contours within normal limits. Aortic atherosclerosis. Small bilateral pleural effusions, left greater than right. Left base atelectasis. No pneumothorax. No acute bony abnormality. IMPRESSION: Changes of CABG. Small bilateral effusions, left greater than right. Left base atelectasis. Electronically Signed   By: Charlett Nose M.D.   On: 06/25/2023 22:20       IMPRESSION/PLAN: 1. Stage 1A (T1, N0, M0) Squamous cell carcinoma of the RUL  It was a pleasure meeting this patient today. Recent imaging shows a  In a visit lasting 60 minutes, greater than 50% of the time was spent face to face discussing the patient's condition, in preparation for the discussion, and coordinating the patient's care.   The above documentation reflects my direct findings during this shared patient visit. Please see the separate note by Dr. Mitzi Hansen on this date for the remainder of the patient's plan of care.    Joyice Faster, PA-C    **Disclaimer: This note was dictated with voice recognition software. Similar sounding words can inadvertently be transcribed and this note may contain transcription errors which may not have been  corrected upon publication of note.**

## 2023-07-05 LAB — ACID FAST CULTURE WITH REFLEXED SENSITIVITIES (MYCOBACTERIA): Acid Fast Culture: NEGATIVE

## 2023-07-11 ENCOUNTER — Other Ambulatory Visit: Payer: Self-pay | Admitting: Psychiatry

## 2023-07-11 DIAGNOSIS — F5105 Insomnia due to other mental disorder: Secondary | ICD-10-CM

## 2023-07-24 ENCOUNTER — Other Ambulatory Visit: Payer: Self-pay | Admitting: Physician Assistant

## 2023-07-26 ENCOUNTER — Other Ambulatory Visit: Payer: Self-pay | Admitting: Physician Assistant

## 2023-07-28 NOTE — Progress Notes (Unsigned)
Cardiology Office Note:    Date:  07/28/2023   ID:  Gregory [name], DOB January 23, 1934, MRN [id number]  PCP:  Barbie Banner, MD   [facility] Providers Cardiologist:  Rollene Rotunda, MD Cardiology APP:  Marcelino Duster, PA { Click to update primary MD,subspecialty MD or APP then REFRESH:1}    Referring MD: Barbie Banner, MD   No chief complaint on file. ***  History of Present Illness:    Gregory [name] is a 89 y.o. 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    He was admitted in January 2024 with chest pain concerning for angina.  At that time heart catheterization on 12/26/2022 showed nonobstructive disease and diffuse calcified vessels.  CTA negative for PE but did show a AAA.  He was readmitted 12/29/2022 for similar presentation and repeat heart catheterization in the setting of NSTEMI revealed 75-80% lesion of the septal perforator just before an aneurysmal segment in the proximal LAD.  This was not ideal for PCI and continued medical therapy was recommended.  He was placed on 5 mg amlodipine and low-dose Imdur.  He has a history of remote tobacco use with a right upper lobe lung nodule.  He was being evaluated for possible lobectomy when he presented with unstable angina.  Heart catheterization showed two-vessel disease not amenable to PCI and was referred for evaluation by CT surgery.  He underwent CABG x 2 with LIMA-LAD, SVG-OM2. 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  I saw him for follow-up after his CABG on 06/12/2023 with his son who helped with history. He was fatigued but progressing.     CAD s/p CABG x 2 LIMA-LAD, SVG-OM2 Currently on aspirin 325 mg Previously on DAPT with aspirin and Plavix Will defer to CT surgery for reduction aspirin to 81 mg Given history of  CVA, could make the argument for Plavix monotherapy He was discharged without Imdur, Ranexa, amlodipine   Hypertension Prior to CABG was on 30 mg Imdur, 5 mg amlodipine Continue 25 mg metoprolol tartrate twice daily,   Hyperlipidemia with LDL goal less than 70 05/16/2023: Cholesterol 104; HDL 33; LDL Cholesterol 44; Triglycerides 133; VLDL 27   PAF Noted postoperatively after CABG (05/2023) 200 mg amiodarone daily-make sure this was reduced off his taper Plan to continue amiodarone and beta-blocker, will attempt to wean amiodarone in 3 months given underlying lung cancer -Given no recurrence, he was not anticoagulated prior to discharge -Continue amiodarone for another month and then stop - Can consider heart monitor to evaluate recurrence   CVA Currently on 325 mg aspirin Previously on DAPT   AAA 4 x 3.6 cm by CT 12/23/2022 Will need surveillance         Past Medical History:  Diagnosis Date   Abdominal aneurysm (HCC)    Anginal pain (HCC)    Coronary artery disease    Depression    History of kidney stones    Hyperlipidemia    Hypertension    Myocardial infarction (HCC) 04/19/2023   NSTEMI   Stroke [facility]) 2004?   TIA    Past Surgical History:  Procedure Laterality Date   COLON SURGERY     polyps removed   COLONOSCOPY     CORONARY ANGIOGRAPHY N/A 12/29/2022   Procedure: CORONARY ANGIOGRAPHY;  Surgeon: Runell Gess,  MD;  Location: [facility] INVASIVE CV LAB;  Service: Cardiovascular;  Laterality: N/A;   CORONARY ARTERY BYPASS GRAFT N/A 05/22/2023   Procedure: CORONARY ARTERY BYPASS GRAFTING (CABG) X 2 WITH LEFT INTERNAL MAMMARY ARTERY HARVEST AND GREATER SAPHENOUS VEIN HARVESTED ENDOSCOPICALLY;  Surgeon: [name], MD;  Location: [facility] OR;  Service: Open Heart Surgery;  Laterality: N/A;   EYE SURGERY     cataract right   IR ANGIO INTRA EXTRACRAN SEL COM CAROTID INNOMINATE BILAT MOD SED  02/28/2018   IR ANGIO VERTEBRAL SEL SUBCLAVIAN INNOMINATE UNI R MOD  SED  02/28/2018   IR RADIOLOGIST EVAL & MGMT  02/12/2021   LEFT HEART CATH AND CORONARY ANGIOGRAPHY N/A 12/26/2022   Procedure: LEFT HEART CATH AND CORONARY ANGIOGRAPHY;  Surgeon: Runell Gess, MD;  Location: [facility] INVASIVE CV LAB;  Service: Cardiovascular;  Laterality: N/A;   LEFT HEART CATH AND CORONARY ANGIOGRAPHY N/A 05/16/2023   Procedure: LEFT HEART CATH AND CORONARY ANGIOGRAPHY;  Surgeon: Corky Crafts, MD;  Location: [facility] INVASIVE CV LAB;  Service: Cardiovascular;  Laterality: N/A;   OTHER SURGICAL HISTORY     Carotid    RADIOLOGY WITH ANESTHESIA N/A 02/28/2018   Procedure: STENTING;  Surgeon: Julieanne Cotton, MD;  Location: [facility] OR;  Service: Radiology;  Laterality: N/A;   TEE WITHOUT CARDIOVERSION N/A 05/22/2023   Procedure: TRANSESOPHAGEAL ECHOCARDIOGRAM;  Surgeon: [name], MD;  Location: [facility] OR;  Service: Open Heart Surgery;  Laterality: N/A;   VIDEO BRONCHOSCOPY WITH ENDOBRONCHIAL NAVIGATION N/A 04/27/2023   Procedure: VIDEO BRONCHOSCOPY WITH ENDOBRONCHIAL NAVIGATION W/ BIOPSIES;  Surgeon: [name], MD;  Location: [facility] OR;  Service: Thoracic;  Laterality: N/A;    Current Medications: No outpatient medications have been marked as taking for the 08/01/23 encounter (Appointment) with Marcelino Duster, PA.     Allergies:   Patient has no known allergies.   Social History   Socioeconomic History   Marital status: Married    Spouse name: Not on file   Number of children: 2   Years of education: Not on file   Highest education level: Some college, no degree  Occupational History   Not on file  Tobacco Use   Smoking status: Former    Types: Cigarettes   Smokeless tobacco: Never  Vaping Use   Vaping status: Never Used  Substance and Sexual Activity   Alcohol use: No   Drug use: No   Sexual activity: Yes  Other Topics Concern   Not on file  Social History Narrative   Lives at home w/ his wife   Right-handed   Caffeine: occasional coffee and  tea, decaf tea at home   Social Determinants of Health   Financial Resource Strain: Not on file  Food Insecurity: No Food Insecurity (05/15/2023)   Hunger Vital Sign    Worried About Running Out of Food in the Last Year: Never true    Ran Out of Food in the Last Year: Never true  Transportation Needs: No Transportation Needs (05/15/2023)   PRAPARE - Administrator, Civil Service (Medical): No    Lack of Transportation (Non-Medical): No  Physical Activity: Not on file  Stress: Not on file  Social Connections: Not on file     Family History: The patient's ***family history includes Cancer in his mother; Heart disease in his brother and mother. There is no history of Tremor.  ROS:   Please see the history of present illness.    *** All other  systems reviewed and are negative.  EKGs/Labs/Other Studies Reviewed:    The following studies were reviewed today: ***      Recent Labs: 12/24/2022: B Natriuretic Peptide 326.2 05/22/2023: ALT 30 05/25/2023: Magnesium 2.0 05/27/2023: Hemoglobin 8.9; Platelets 130 06/12/2023: BUN 14; Creatinine, Ser 1.23; Potassium 5.0; Sodium 139  Recent Lipid Panel    Component Value Date/Time   CHOL 104 05/16/2023 0327   TRIG 133 05/16/2023 0327   HDL 33 (L) 05/16/2023 0327   CHOLHDL 3.2 05/16/2023 0327   VLDL 27 05/16/2023 0327   LDLCALC 44 05/16/2023 0327     Risk Assessment/Calculations:   {Does this patient have ATRIAL FIBRILLATION?:[id number]}  No BP recorded.  {Refresh Note OR Click here to enter BP  :1}***         Physical Exam:    VS:  There were no vitals taken for this visit.    Wt Readings from Last 3 Encounters:  06/27/23 185 lb (83.9 kg)  06/20/23 185 lb (83.9 kg)  06/12/23 182 lb (82.6 kg)     GEN: *** Well nourished, well developed in no acute distress HEENT: Normal NECK: No JVD; No carotid bruits LYMPHATICS: No lymphadenopathy CARDIAC: ***RRR, no murmurs, rubs, gallops RESPIRATORY:  Clear to auscultation  without rales, wheezing or rhonchi  ABDOMEN: Soft, non-tender, non-distended MUSCULOSKELETAL:  No edema; No deformity  SKIN: Warm and dry NEUROLOGIC:  Alert and oriented x 3 PSYCHIATRIC:  Normal affect   ASSESSMENT:    No diagnosis found. PLAN:    In order of problems listed above:  ***      {Are you ordering a CV Procedure (e.g. stress test, cath, DCCV, TEE, etc)?   Press F2        :[id number]}    Medication Adjustments/Labs and Tests Ordered: Current medicines are reviewed at length with the patient today.  Concerns regarding medicines are outlined above.  No orders of the defined types were placed in this encounter.  No orders of the defined types were placed in this encounter.   There are no Patient Instructions on file for this visit.   Signed, Marcelino Duster, [place]  07/28/2023 3:18 PM    [facility]

## 2023-08-01 ENCOUNTER — Encounter: Payer: Self-pay | Admitting: Physician Assistant

## 2023-08-01 ENCOUNTER — Ambulatory Visit: Payer: 59 | Attending: Physician Assistant | Admitting: Physician Assistant

## 2023-08-01 VITALS — BP 120/52 | HR 54 | Ht 72.0 in | Wt 184.2 lb

## 2023-08-01 DIAGNOSIS — I714 Abdominal aortic aneurysm, without rupture, unspecified: Secondary | ICD-10-CM

## 2023-08-01 DIAGNOSIS — I25110 Atherosclerotic heart disease of native coronary artery with unstable angina pectoris: Secondary | ICD-10-CM

## 2023-08-01 DIAGNOSIS — E785 Hyperlipidemia, unspecified: Secondary | ICD-10-CM | POA: Diagnosis not present

## 2023-08-01 DIAGNOSIS — Z951 Presence of aortocoronary bypass graft: Secondary | ICD-10-CM | POA: Diagnosis not present

## 2023-08-01 DIAGNOSIS — I10 Essential (primary) hypertension: Secondary | ICD-10-CM | POA: Diagnosis not present

## 2023-08-01 DIAGNOSIS — Z8673 Personal history of transient ischemic attack (TIA), and cerebral infarction without residual deficits: Secondary | ICD-10-CM

## 2023-08-01 DIAGNOSIS — I480 Paroxysmal atrial fibrillation: Secondary | ICD-10-CM

## 2023-08-01 MED ORDER — AMIODARONE HCL 200 MG PO TABS: 200.0000 mg | ORAL_TABLET | Freq: Every day | ORAL | 3 refills | Status: DC

## 2023-08-01 NOTE — Patient Instructions (Addendum)
Medication Instructions:  Your physician has recommended you make the following change in your medication:   -Take 200mg , Amiodarone daily.  -Take aspirin 81mg  once daily.  *If you need a refill on your cardiac medications before your next appointment, please call your pharmacy*    Follow-Up: At [facility], you and your health needs are our priority.  As part of our continuing mission to provide you with exceptional heart care, we have created designated Provider Care Teams.  These Care Teams include your primary Cardiologist (physician) and Advanced Practice Providers (APPs -  Physician Assistants and Nurse Practitioners) who all work together to provide you with the care you need, when you need it.  We recommend signing up for the patient portal called "MyChart".  Sign up information is provided on this After Visit Summary.  MyChart is used to connect with patients for Virtual Visits (Telemedicine).  Patients are able to view lab/test results, encounter notes, upcoming appointments, etc.  Non-urgent messages can be sent to your provider as well.   To learn more about what you can do with MyChart, go to [email or web address].    Your next appointment:   1 year(s): Wednesday, November 20th at 2:40pm  Provider:   Rollene Rotunda, MD     Other Instructions Marylene Land recommends sodium restriction of 1800mg  daily.   [facility]:

## 2023-08-08 ENCOUNTER — Ambulatory Visit (INDEPENDENT_AMBULATORY_CARE_PROVIDER_SITE_OTHER): Payer: Self-pay | Admitting: Thoracic Surgery (Cardiothoracic Vascular Surgery)

## 2023-08-08 VITALS — BP 101/65 | HR 54 | Resp 20 | Ht 72.0 in | Wt 184.0 lb

## 2023-08-08 DIAGNOSIS — R911 Solitary pulmonary nodule: Secondary | ICD-10-CM

## 2023-08-08 DIAGNOSIS — Z951 Presence of aortocoronary bypass graft: Secondary | ICD-10-CM

## 2023-08-08 NOTE — Progress Notes (Signed)
[address]       [name] [address]             [phone]     HPI: Mr. Paez returns to discuss management of his right upper lobe squamous cell carcinoma.  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  He presented with chest pain in January.  Catheterization showed mild CAD.  CT of the chest showed a 1.5 cm spiculated nodule in the right upper lobe.  He was discharged but returned the following day with recurrent chest pain and ruled in for non-STEMI.  Repeat catheterization showed a 70 to 80% LAD lesion.  He was treated medically.  A follow-up CT showed slight increase in the size of the lung nodule.  On PET/CT it was hypermetabolic with an SUV of 6.  I did a navigational bronchoscopy in May.  Biopsy showed inflammation but no definite granulomas.  There was no malignancy seen.  He was scheduled for surgical resection due to a high index of suspicion but presented with worsening anginal pain.  Repeat catheterization showed complex two-vessel disease.  He underwent coronary bypass grafting x 2 on 05/22/2023.  I did a needle biopsy of the right upper lobe nodule, which showed squamous cell carcinoma.  I did not think it was appropriate to attempt resection given his advanced age.  I last saw him in the office in early July.  He was still getting over his bypass surgery.  Given his advanced age, I recommended that he at least consider stereotactic radiation.  He saw Dr. Mitzi Hansen who felt he was a candidate for stereotactic radiation should he choose that option.  He is feeling well.  He is making some progress.  Still does not have 100% of his energy back  but feels better than he did prior to surgery.  Past Medical History:  Diagnosis Date   Abdominal aneurysm (HCC)    Anginal pain (HCC)    Coronary artery disease    Depression    History of kidney stones    Hyperlipidemia    Hypertension    Myocardial infarction [facility]) 04/19/2023   NSTEMI   Stroke [facility]) 2004?   TIA      Current Outpatient Medications  Medication Sig Dispense Refill   ALPRAZolam (XANAX) 0.5 MG tablet Take 0.5 mg by mouth at bedtime. Take 0.5 mg in the morning and 1 mg at bedtime     amiodarone (PACERONE) 200 MG tablet Take 1 tablet (200 mg total) by mouth daily. 90 tablet 3   amLODipine (NORVASC) 5 MG tablet Take 5 mg by mouth daily.     aspirin EC 81 MG tablet Take 81 mg by mouth daily. Swallow whole.     atorvastatin (LIPITOR) 80 MG tablet Take 1 tablet (80 mg total) by mouth daily. 90 tablet 3   clopidogrel (PLAVIX) 75 MG tablet Take 75 mg by mouth daily.     isosorbide mononitrate (IMDUR) 30 MG 24 hr tablet Take 15 mg by mouth daily.     metoprolol tartrate (LOPRESSOR) 25 MG tablet Take 1 tablet (25 mg total) by mouth 2 (two) times daily. 60 tablet 1   mirtazapine (REMERON) 7.5 MG tablet TAKE  1 TABLET(7.5 MG) BY MOUTH AT BEDTIME 90 tablet 1   Multiple Vitamins-Minerals (CENTRUM ADULTS PO) Take 1 tablet by mouth daily.      Multiple Vitamins-Minerals (ZINC PO) Take by mouth.     Omega-3 Fatty Acids (FISH OIL PO) Take 1 capsule by mouth daily.     sertraline (ZOLOFT) 50 MG tablet TAKE 3 TABLETS(150 MG) BY MOUTH DAILY 270 tablet 0   UNABLE TO FIND Take 1 tablet by mouth daily. Bone supplement     ferrous sulfate 325 (65 FE) MG EC tablet Take 1 tablet (325 mg total) by mouth daily with breakfast. 60 tablet 0   No current facility-administered medications for this visit.    Physical Exam BP 101/65   Pulse (!) 54   Resp 20   Ht 6' (1.829 m)   Wt 184 lb (83.5 kg)   SpO2 92% Comment: RA  BMI 24.19 kg/m  89 year old man in no acute distress Alert and  oriented x 3 with no focal deficits No cervical or supraclavicular adenopathy Lungs clear bilaterally Cardiac mildly bradycardic, regular Sternum stable, incision well-healed  Diagnostic Tests: I reviewed the CT and PET/CT from May.  1.5 cm spiculated right upper lobe nodule that is hypermetabolic.  No  regional or distant metastatic disease.  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  Coronary artery disease with unstable angina, status post CABG x 2.  Now about 2-1/2 months out from surgery and recovering well.  No recurrent anginal symptoms.  Clinical stage Ia squamous cell carcinoma right upper lobe-unfortunately treatment for this lesion has been delayed because of his coronary bypass grafting and now his recovery.  I think at this point it is time to proceed with treatment.  He saw Dr. Mitzi Hansen from radiation oncology.  They discussed potential stereotactic radiation.  I think he is a candidate for surgery but would be relatively high risk given his age and very recent coronary bypass grafting.  It is just a lot for an 89 year old to go through consecutively.  However, even though risk is relatively high I do not think it is prohibitive and surgery is still an option.  I had a long discussion with Mr. Medor and his son regarding surgery versus radiation and the relative advantages and disadvantages of each approach.  Either approach is reasonable in his case.  Surgery offers a better chance of cure but with the risk of significant complications.  They question as to whether we could try radiation first and then do surgery.  I made it clear that that is not  necessarily an option, but would have to be a decision made at that time based on the available data.  They do understand the surgery would be done minimally invasively using the surgical robot.  They understand the need for general anesthesia, the incisions to be made, the use of the surgical robot, the use of a drainage tube postoperatively, the expected hospital stay, and the overall recovery.  He is aware of the indications, risks, benefits, and alternatives.  They understand the risks include but not limited to death, MI, DVT, PE, bleeding, possible need for transfusion, possible need for conversion to open, infection, air leaks, as well as other unforeseeable  complications.  He wishes to think over these options and talk to his family before making a final decision.  I encouraged him to do so in the near future.  Should he wish to proceed with surgery he will need a another CT as it has been 3 months since his most recent scans.  Also would need to hold Plavix for 5 days prior to surgery.  Plan: Patient will call to let us know if he would like to proceed with robotic right upper lobectomy or stereotactic radiation.   [name], MD [facility] [phone]

## 2023-08-10 ENCOUNTER — Telehealth: Payer: Self-pay | Admitting: *Deleted

## 2023-08-10 ENCOUNTER — Telehealth: Payer: Self-pay | Admitting: Radiation Oncology

## 2023-08-10 DIAGNOSIS — C3411 Malignant neoplasm of upper lobe, right bronchus or lung: Secondary | ICD-10-CM

## 2023-08-10 NOTE — Telephone Encounter (Signed)
CALLED PATIENT TO INFORM OF CT FOR 08-28-23- ARRIVAL TIME- 3 PM, PATIENT TO NOT HAVE SOLID FOOD - 4 HRS. PRIOR TO TEST, SCAN TO BE @  [address], LVM FOR A RETURN CALL

## 2023-08-10 NOTE — Telephone Encounter (Signed)
The patient was seen in clinic for Stage IA2, cT1N0M0, NSCLC, squamous cell carcinoma of the RUL. Dr. Mitzi Hansen met with him last month and he was trying to decide for either surgery or SBRT treatment. He met back with Dr. Dorris Fetch, and has decided to proceed with SBRT. Since it has been 3 months since his last diagnostic scan, Dr. Mitzi Hansen recommends repeating a CT scan prior to proceeding with SBRT to ensure he's still a candidate for the therapy and for treatment planning purposes. I will order a new scan and once we know when his CT will be performed, we will schedule simulation to follow and subsequent treatment. I spoke with his son Haaris Paris by phone who is in agreement with this plan.

## 2023-08-10 NOTE — Telephone Encounter (Signed)
RETURNED PATIENT'S SON (Gregory [name]) PHONE CALL, SPOKE WITH PATIENT'S SON - Gregory [name]

## 2023-08-27 NOTE — Progress Notes (Deleted)
Cardiology Office Note:   Date:  08/27/2023  ID:  Gregory [name], DOB [birth year], MRN [id number] PCP: Barbie Banner, MD  [facility] Providers Cardiologist:  Rollene Rotunda, MD Cardiology APP:  Marcelino Duster, PA {  History of Present Illness:   Gregory [name] is a 89 y.o. male hx of CAD s/p CABG .  He was admitted in January 2024 with chest pain concerning for angina.  At that time heart catheterization on 12/26/2022 showed nonobstructive disease and diffuse calcified vessels.  CTA negative for PE but did show a AAA.  He was readmitted 12/29/2022 for similar presentation and repeat heart catheterization in the setting of NSTEMI revealed 75-80% lesion of the septal perforator just before an aneurysmal segment in the proximal LAD.  This was not ideal for PCI and continued medical therapy was recommended.  He was placed on 5 mg amlodipine and low-dose Imdur. He was being evaluated for possible lobectomy when he presented with unstable angina.  ***  ***  Heart catheterization showed two-vessel disease not amenable to PCI and was referred for evaluation by CT surgery.  He underwent CABG x 2 with LIMA-LAD, SVG-OM2. 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   I saw him for follow-up after his CABG on 06/12/2023 with his son who helped with history. He was fatigued but progressing. He is here today for follow up.  He is doing well, will discuss next steps for lung biopsy with surgeon next week.     ROS: ***  Studies Reviewed:    EKG:       ***  Risk Assessment/Calculations:   {Does this patient have ATRIAL FIBRILLATION?[phone]} No BP recorded.  {Refresh Note OR Click here to enter BP  :1}***        Physical Exam:   VS:  There were no vitals taken for this visit.   Wt Readings from Last 3 Encounters:  08/08/23 184 lb (83.5 kg)  08/01/23 184 lb 3.2 oz (83.6 kg)  06/27/23 185 lb (83.9 kg)     GEN: Well nourished, well developed in no acute  distress NECK: No JVD; No carotid bruits CARDIAC: ***RRR, no murmurs, rubs, gallops RESPIRATORY:  Clear to auscultation without rales, wheezing or rhonchi  ABDOMEN: Soft, non-tender, non-distended EXTREMITIES:  No edema; No deformity   ASSESSMENT AND PLAN:   CAD s/p CABG x 2 LIMA-LAD, SVG-OM2:  ***   Now on 81 mg ASA Previously on DAPT with aspirin and Plavix Given history of CVA, could make the argument for Plavix monotherapy, but with upcoming possible lobectomy, can hold off and continue 81 mg ASA for now He was discharged without Imdur, Ranexa, amlodipine - no chest pain, walking well     Hypertension:  ***   Prior to CABG was on 30 mg imdur and 5 mg amlodipine Continue 25 mg metoprolol tartrate twice daily Continue 15 mg imdur     Hyperlipidemia:  ***  with LDL goal less than 70 05/16/2023: Cholesterol 104; HDL 33; LDL Cholesterol 44; Triglycerides 133; VLDL 27 Continue 80 mg lipitor     PAF:  ***   Noted postoperatively after CABG (05/2023) 200 mg amiodarone daily-make sure this was reduced off his taper Plan to continue amiodarone and beta-blocker, will attempt to wean amiodarone in 3 months given underlying lung cancer -Given no recurrence, he was not anticoagulated prior to discharge - Can consider heart monitor to evaluate recurrence -- given the possibility that he may need a  lobectomy, will continue 200 mg amiodarone for now     CVA:  ***   Previously on DAPT - now on 81 mg ASA     AAA:  ***   4 x 3.6 cm by CT 12/23/2022 Will need surveillanc    {Are you ordering a CV Procedure (e.g. stress test, cath, DCCV, TEE, etc)?   Press F2        :[id number]}  Follow up ***  Signed, Rollene Rotunda, MD

## 2023-08-28 ENCOUNTER — Ambulatory Visit
Admission: RE | Admit: 2023-08-28 | Discharge: 2023-08-28 | Disposition: A | Discharge status: Home / Self Care | Payer: PPO | Source: Ambulatory Visit | Attending: Radiation Oncology | Admitting: Radiation Oncology

## 2023-08-28 DIAGNOSIS — C3411 Malignant neoplasm of upper lobe, right bronchus or lung: Secondary | ICD-10-CM

## 2023-08-28 MED ORDER — IOPAMIDOL (ISOVUE-300) INJECTION 61%
75.0000 mL | Freq: Once | INTRAVENOUS | Status: AC | PRN
  Administered 2023-08-28: 75 mL via INTRAVENOUS

## 2023-08-29 ENCOUNTER — Other Ambulatory Visit: Payer: Self-pay | Admitting: Physician Assistant

## 2023-08-30 ENCOUNTER — Ambulatory Visit: Payer: PPO | Admitting: Cardiology

## 2023-08-30 DIAGNOSIS — I480 Paroxysmal atrial fibrillation: Secondary | ICD-10-CM

## 2023-08-30 DIAGNOSIS — I714 Abdominal aortic aneurysm, without rupture, unspecified: Secondary | ICD-10-CM

## 2023-08-30 DIAGNOSIS — I10 Essential (primary) hypertension: Secondary | ICD-10-CM

## 2023-08-30 DIAGNOSIS — I25110 Atherosclerotic heart disease of native coronary artery with unstable angina pectoris: Secondary | ICD-10-CM

## 2023-08-30 DIAGNOSIS — E785 Hyperlipidemia, unspecified: Secondary | ICD-10-CM

## 2023-09-05 ENCOUNTER — Telehealth: Payer: Self-pay | Admitting: Radiation Oncology

## 2023-09-05 NOTE — Telephone Encounter (Signed)
I called and LM for pt's son to discuss the results of the CT scan and that Dr. Mitzi Hansen recommends SBRT as previously offered. Orders placed for simulation reservation.

## 2023-09-13 ENCOUNTER — Other Ambulatory Visit: Payer: Self-pay | Admitting: Psychiatry

## 2023-09-13 DIAGNOSIS — F3181 Bipolar II disorder: Secondary | ICD-10-CM

## 2023-09-13 NOTE — Telephone Encounter (Signed)
Sent MyChart message, was due in July.

## 2023-09-18 ENCOUNTER — Ambulatory Visit
Admission: RE | Admit: 2023-09-18 | Discharge: 2023-09-18 | Disposition: A | Discharge status: Home / Self Care | Payer: PPO | Source: Ambulatory Visit | Attending: Radiation Oncology | Admitting: Radiation Oncology

## 2023-09-18 ENCOUNTER — Other Ambulatory Visit: Payer: Self-pay

## 2023-09-18 DIAGNOSIS — C3411 Malignant neoplasm of upper lobe, right bronchus or lung: Secondary | ICD-10-CM | POA: Insufficient documentation

## 2023-09-18 DIAGNOSIS — Z87891 Personal history of nicotine dependence: Secondary | ICD-10-CM | POA: Insufficient documentation

## 2023-09-18 NOTE — Progress Notes (Signed)
I spoke with the patient and his son and simulation.  The patient had recently been last week in [place] and was seen in the emergency room due to right-sided chest pain.  From what they describe it sounds like he had a CT of the chest with angiography to rule out embolism they report negative studies and no evidence of focal findings to explain his symptoms.  We reviewed his CT scan that was recently performed in our system and there was no description of chest wall abnormalities but did show some curvature of the thoracic spine questionably referencing arthritic changes and/or architectural distortion in the peripheral right lung.  We will try and obtain those films as they are not available in Care Everywhere.  We discussed his symptoms seem to be related to radicular complaints and while his pain has improved he was encouraged to let us know if his symptoms progress.  If this were the case an MRI of the thoracic spine would be appropriate workup of his thoracic spine for arthritis and/or pinched nerve.  He is in agreement and will proceed with simulation today.

## 2023-09-26 ENCOUNTER — Ambulatory Visit
Admission: RE | Admit: 2023-09-26 | Discharge: 2023-09-26 | Disposition: A | Discharge status: Home / Self Care | Payer: PPO | Source: Ambulatory Visit | Attending: Radiation Oncology | Admitting: Radiation Oncology

## 2023-09-26 DIAGNOSIS — C3411 Malignant neoplasm of upper lobe, right bronchus or lung: Secondary | ICD-10-CM | POA: Insufficient documentation

## 2023-09-26 DIAGNOSIS — Z87891 Personal history of nicotine dependence: Secondary | ICD-10-CM | POA: Diagnosis not present

## 2023-09-27 ENCOUNTER — Other Ambulatory Visit: Payer: Self-pay

## 2023-09-27 DIAGNOSIS — C3411 Malignant neoplasm of upper lobe, right bronchus or lung: Secondary | ICD-10-CM | POA: Diagnosis not present

## 2023-09-27 LAB — RAD ONC ARIA SESSION SUMMARY
Course Elapsed Days: 0
Plan Fractions Treated to Date: 1
Plan Prescribed Dose Per Fraction: 18 Gy
Plan Total Fractions Prescribed: 3
Plan Total Prescribed Dose: 54 Gy
Reference Point Dosage Given to Date: 18 Gy
Reference Point Session Dosage Given: 18 Gy
Session Number: 1

## 2023-09-29 ENCOUNTER — Other Ambulatory Visit: Payer: Self-pay

## 2023-09-29 ENCOUNTER — Ambulatory Visit
Admission: RE | Admit: 2023-09-29 | Discharge: 2023-09-29 | Disposition: A | Discharge status: Home / Self Care | Payer: PPO | Source: Ambulatory Visit | Attending: Radiation Oncology | Admitting: Radiation Oncology

## 2023-09-29 DIAGNOSIS — C3411 Malignant neoplasm of upper lobe, right bronchus or lung: Secondary | ICD-10-CM | POA: Diagnosis not present

## 2023-09-29 LAB — RAD ONC ARIA SESSION SUMMARY
Course Elapsed Days: 2
Plan Fractions Treated to Date: 2
Plan Prescribed Dose Per Fraction: 18 Gy
Plan Total Fractions Prescribed: 3
Plan Total Prescribed Dose: 54 Gy
Reference Point Dosage Given to Date: 36 Gy
Reference Point Session Dosage Given: 18 Gy
Session Number: 2

## 2023-10-02 ENCOUNTER — Other Ambulatory Visit: Payer: Self-pay

## 2023-10-02 ENCOUNTER — Ambulatory Visit
Admission: RE | Admit: 2023-10-02 | Discharge: 2023-10-02 | Disposition: A | Discharge status: Home / Self Care | Payer: PPO | Source: Ambulatory Visit | Attending: Radiation Oncology | Admitting: Radiation Oncology

## 2023-10-02 DIAGNOSIS — C3411 Malignant neoplasm of upper lobe, right bronchus or lung: Secondary | ICD-10-CM | POA: Diagnosis not present

## 2023-10-02 LAB — RAD ONC ARIA SESSION SUMMARY
Course Elapsed Days: 5
Plan Fractions Treated to Date: 3
Plan Prescribed Dose Per Fraction: 18 Gy
Plan Total Fractions Prescribed: 3
Plan Total Prescribed Dose: 54 Gy
Reference Point Dosage Given to Date: 54 Gy
Reference Point Session Dosage Given: 18 Gy
Session Number: 3

## 2023-10-03 NOTE — Radiation Completion Notes (Signed)
Radiation Oncology         [phone] ________________________________  Name: DUMONT CHARANIA MRN: [id number]  Date of Service: 10/02/2023  DOB: 01/24/1934  End of Treatment Note  Diagnosis:  Stage IA2, cT1bN0M0, NSCLC, Squamous Cell Carcinoma of the RUL  Intent: Curative     ==========DELIVERED PLANS==========  First Treatment Date: 2023-09-27 - Last Treatment Date: 2023-10-02   Plan Name: Lung_R_SBRT Site: Lung, RUL target,R including 4 mm RUL satellite nodule Technique: SBRT/SRT-IMRT Mode: Photon Dose Per Fraction: 18 Gy Prescribed Dose (Delivered / Prescribed): 54 Gy / 54 Gy Prescribed Fxs (Delivered / Prescribed): 3 / 3     ==========ON TREATMENT VISIT DATES========== 2023-09-27, 2023-09-29, 2023-09-29, 2023-10-02   See weekly On Treatment Notes in Epic for details. The patient tolerated radiation.    The patient will receive a call in about one month from the radiation oncology department. He will be set up for a CT for restaging in 6-8 weeks.     Osker Mason, PAC

## 2023-10-04 ENCOUNTER — Ambulatory Visit: Payer: PPO | Admitting: Radiation Oncology

## 2023-10-05 ENCOUNTER — Other Ambulatory Visit: Payer: Self-pay | Admitting: Radiation Oncology

## 2023-10-05 DIAGNOSIS — C3411 Malignant neoplasm of upper lobe, right bronchus or lung: Secondary | ICD-10-CM

## 2023-10-06 ENCOUNTER — Ambulatory Visit: Payer: PPO | Admitting: Radiation Oncology

## 2023-10-08 ENCOUNTER — Other Ambulatory Visit: Payer: Self-pay | Admitting: Physician Assistant

## 2023-10-15 ENCOUNTER — Other Ambulatory Visit: Payer: Self-pay | Admitting: Psychiatry

## 2023-10-15 DIAGNOSIS — F3181 Bipolar II disorder: Secondary | ICD-10-CM

## 2023-10-15 NOTE — Telephone Encounter (Signed)
Please call to schedule FU, did not respond to MyChart message to schedule.

## 2023-10-16 NOTE — Telephone Encounter (Signed)
Lvm for pt to call and schedule

## 2023-10-18 ENCOUNTER — Ambulatory Visit (INDEPENDENT_AMBULATORY_CARE_PROVIDER_SITE_OTHER): Payer: PPO | Admitting: Psychiatry

## 2023-10-18 ENCOUNTER — Encounter: Payer: Self-pay | Admitting: Psychiatry

## 2023-10-18 DIAGNOSIS — F39 Unspecified mood [affective] disorder: Secondary | ICD-10-CM | POA: Diagnosis not present

## 2023-10-18 DIAGNOSIS — F5105 Insomnia due to other mental disorder: Secondary | ICD-10-CM | POA: Diagnosis not present

## 2023-10-18 DIAGNOSIS — F3181 Bipolar II disorder: Secondary | ICD-10-CM

## 2023-10-18 MED ORDER — SERTRALINE HCL 50 MG PO TABS: ORAL_TABLET | ORAL | 2 refills | Status: DC

## 2023-10-18 NOTE — Progress Notes (Signed)
Gregory [name] [id number] 1934-05-12 89 y.o.  Subjective:   Patient ID:  Gregory [name] is a 89 y.o. (DOB Jun 05, 1934) male.  Chief Complaint:  Chief Complaint  Patient presents with   Follow-up    Mood and sleep and meds    HPI Gregory [name] presents to the office today for follow-up of bipolar 2 depression and chronic insomnia with shift related sleep problems.  seen 07/30/2019 with the folloowing noted:  Seen with wife.  Forced into Covid. Retirement.  No major complaints re: sx.  Patient reports stable mood and denies depressed or irritable moods.  Patient denies any recent difficulty with anxiety.  Patient denies difficulty with sleep initiation or maintenance.  Goes to bed until 2-3 AM and up about 11 AM.  Denies appetite disturbance.  Patient reports that energy and motivation have been good.  Patient denies any difficulty with concentration.  Patient denies any suicidal ideation. Wife only concern is his sleep pattern.   He says he goes to sleep [name].  Plan no med changes  06/26/20 [name], spouse, called to report that Gregory [name] decreased/changed how he is taking is medication.  She is not sure when he did this, but she now seeing signs that the change is affecting him.  He is becoming confused.  Last 2 nights couldn't mae out he words.  He is traveling with his son and has done some little parts in their program, but it hasn't good the last 2 nights.  He is repeating questions.  Sleeping a bit later in the mornings.  He claims he is [name]: Not depressed.  But Gregory [name] is seeing him decline slightly and would you to talk with him about how he is taking his medications and the affects changes could cause.  Has appt 8/19 but doesn't want to wait that long for someone to talk with him.  His cell is [phone].   MD response:The only thing he's taking from me is sertraline and mirtazapine and changes in either of those should not cause confusion.  I assume he's not overtaking the Xanax he  takes for sleep bc that can cause those problems especially in the morning.  I'm not prescribing that as I recall. I'm concerned he may have had another stroke.  They need to get him to the ER ASAP to be sure.  08/06/20 appt with the following noted: As far as I'm concerned I'm good.  Not depressed.  Concerned about world situation.   Had cut down sertraline from 150 to 100 mg and sons said he got confused but he didn't notice it.  That is cleared up now.  She says he's back to normal. Cut down on alprazolam at night to 0.5 mg HS.  And still taking mirtazapine 7.5 mg HS.  Still delayed sleep cycle.  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  Tired of doing nothing.    Plan: No med changes  04/01/2021 appointment with following noted: Doing well.  No major complaints.  No mania acknowledged.  Continues to be active in [place] music.  MRI and physical OK.  Tremor is still there intermittently.  One hand weaker. Thankful doing well.  Wife not doing as well as he'd like from the cancer.  She's dizzy.   What put me in depression both times was money but  Over it. Mirtazpaine helping sleep. Patient reports stable mood and denies depressed or irritable moods.  Patient denies any recent difficulty with anxiety.  Patient denies difficulty with sleep  initiation or maintenance. Denies appetite disturbance.  Patient reports that energy and motivation have been good.  Patient denies any difficulty with concentration.  Patient denies any suicidal ideation. Plan: No med changes.  Continue alprazolam 0.5 mg and mirtazapine 7.5 mg nightly for sleep. Continue sertraline 150 mg daily  10/14/2021 appointment with the following noted: No depression.  Patient reports stable mood and denies depressed or irritable moods.  Patient denies any recent difficulty with anxiety.  Patient denies difficulty with sleep initiation or maintenance. Denies appetite disturbance.  Patient reports that energy and motivation have been good.   Patient denies any difficulty with concentration.  Patient denies any suicidal ideation. Health good.   No SE.  Occ tremor.  No sig caffeine.   Consistent with sertraline 150 mg daily. Enjoys farm work and music.   Plan:   01/10/23 appt noted: Had 2 heart attacks since here.  Gregory [name] and then in ER 12/23/22.  Had another episode.  Seen cardiologist.   Feels good.  Not depressed.  Don't even think about. Sleep is perfect.  With mirtazapine and alprazolam.  To bed 2 AM.   No SE.  10/18/23 appt noted: Meds: alprazolam 0.5 mg HS, mirtazapine 7.5 mg HS, sertraline 150 CABG x2 in June.  Recovered. 2 small MI prior to surgery.   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   Patient reports stable mood and denies depressed or irritable moods.  Patient denies any recent difficulty with anxiety.  Patient denies difficulty with sleep initiation or maintenance. Denies appetite disturbance.  Patient reports that energy and motivation have been good.  Patient denies any difficulty with concentration.  Patient denies any suicidal ideation. Pleased with meds. Worry over youngest son with drinking problem.    Past Psychiatric Medication Trials: Depakote side effects, lithium tremor, Wellbutrin side effects, duloxetine, Pristiq, sertraline, Abilify lost response, buspirone, pramipexole 0.125, lamotrigine no response, Latuda 40, Rexulti, buspirone, methylphenidate, methyl folate, olanzapine, Lunesta, Ambien, Xanax, mirtazapine, trazodone, temazepam, propranolol Under my psychiatric care since 2005  Review of Systems:  Review of Systems  Cardiovascular:  Positive for chest pain. Negative for palpitations.  Neurological:  Positive for tremors. Negative for dizziness and weakness.  Psychiatric/Behavioral:  Negative for dysphoric mood.     Medications: I have reviewed the patient's current medications.  Current Outpatient Medications  Medication Sig Dispense Refill   ALPRAZolam (XANAX) 0.5 MG  tablet Take 0.5 mg by mouth at bedtime. Take 0.5 mg in the morning and 1 mg at bedtime     amiodarone (PACERONE) 200 MG tablet Take 1 tablet (200 mg total) by mouth daily. 90 tablet 3   amLODipine (NORVASC) 5 MG tablet Take 5 mg by mouth daily.     aspirin EC 81 MG tablet Take 81 mg by mouth daily. Swallow whole.     atorvastatin (LIPITOR) 80 MG tablet Take 1 tablet (80 mg total) by mouth daily. 90 tablet 3   clopidogrel (PLAVIX) 75 MG tablet Take 75 mg by mouth daily.     isosorbide mononitrate (IMDUR) 30 MG 24 hr tablet Take 15 mg by mouth daily.     metoprolol tartrate (LOPRESSOR) 25 MG tablet Take 1 tablet (25 mg total) by mouth 2 (two) times daily. 60 tablet 1   mirtazapine (REMERON) 7.5 MG tablet TAKE 1 TABLET(7.5 MG) BY MOUTH AT BEDTIME 90 tablet 1   Multiple Vitamins-Minerals (CENTRUM ADULTS PO) Take 1 tablet by mouth daily.      Multiple Vitamins-Minerals (ZINC PO)  Take by mouth.     Omega-3 Fatty Acids (FISH OIL PO) Take 1 capsule by mouth daily.     sertraline (ZOLOFT) 50 MG tablet TAKE 3 TABLETS(150 MG) BY MOUTH DAILY 90 tablet 0   UNABLE TO FIND Take 1 tablet by mouth daily. Bone supplement     ferrous sulfate 325 (65 FE) MG EC tablet Take 1 tablet (325 mg total) by mouth daily with breakfast. 60 tablet 0   No current facility-administered medications for this visit.    Medication Side Effects: None  Allergies:  No Known Allergies   Past Medical History:  Diagnosis Date   Abdominal aneurysm (HCC)    Anginal pain (HCC)    Coronary artery disease    Depression    History of kidney stones    Hyperlipidemia    Hypertension    Myocardial infarction [facility]) 04/19/2023   NSTEMI   Stroke [facility]) 2004?   TIA    Family History  Problem Relation Age of Onset   Cancer Mother        breast   Heart disease Mother    Heart disease Brother    Tremor Neg Hx     Social History   Socioeconomic History   Marital status: Married    Spouse name: Not on file   Number of  children: 2   Years of education: Not on file   Highest education level: Some college, no degree  Occupational History   Not on file  Tobacco Use   Smoking status: Former    Types: Cigarettes   Smokeless tobacco: Never  Vaping Use   Vaping status: Never Used  Substance and Sexual Activity   Alcohol use: No   Drug use: No   Sexual activity: Yes  Other Topics Concern   Not on file  Social History Narrative   Lives at home w/ his wife   Right-handed   Caffeine: occasional coffee and tea, decaf tea at home   Social Determinants of Health   Financial Resource Strain: Not on file  Food Insecurity: No Food Insecurity (05/15/2023)   Hunger Vital Sign    Worried About Running Out of Food in the Last Year: Never true    Ran Out of Food in the Last Year: Never true  Transportation Needs: No Transportation Needs (05/15/2023)   PRAPARE - Administrator, Civil Service (Medical): No    Lack of Transportation (Non-Medical): No  Physical Activity: Not on file  Stress: Not on file  Social Connections: Not on file  Intimate Partner Violence: Not At Risk (05/15/2023)   Humiliation, Afraid, Rape, and Kick questionnaire    Fear of Current or Ex-Partner: No    Emotionally Abused: No    Physically Abused: No    Sexually Abused: No    Past Medical History, Surgical history, Social history, and Family history were reviewed and updated as appropriate.   Please see review of systems for further details on the patient's review from today.   Objective:   Physical Exam:  There were no vitals taken for this visit.  Physical Exam Constitutional:      General: He is not in acute distress.    Appearance: He is well-developed.  Musculoskeletal:        General: No deformity.  Neurological:     Mental Status: He is alert and oriented to person, place, and time.     Coordination: Coordination normal.  Psychiatric:  Attention and Perception: Attention and perception normal. He  does not perceive auditory or visual hallucinations.        Mood and Affect: Mood normal. Mood is not anxious or depressed. Affect is not labile, blunt, angry or inappropriate.        Speech: Speech normal. Speech is not rapid and pressured or slurred.        Behavior: Behavior normal. Behavior is not slowed.        Thought Content: Thought content normal. Thought content is not paranoid or delusional. Thought content does not include homicidal or suicidal ideation. Thought content does not include suicidal plan.        Cognition and Memory: Cognition and memory normal.        Judgment: Judgment normal.     Comments: Insight intact Good humor.  Mild forgetfulness.  Mild word finding issues     Lab Review:     Component Value Date/Time   NA 139 06/12/2023 1559   K 5.0 06/12/2023 1559   CL 101 06/12/2023 1559   CO2 22 06/12/2023 1559   GLUCOSE 96 06/12/2023 1559   GLUCOSE 139 (H) 05/28/2023 0219   BUN 14 06/12/2023 1559   CREATININE 1.23 06/12/2023 1559   CALCIUM 10.0 06/12/2023 1559   PROT 6.9 05/22/2023 0638   ALBUMIN 3.9 05/22/2023 0638   AST 29 05/22/2023 0638   ALT 30 05/22/2023 0638   ALKPHOS 102 05/22/2023 0638   BILITOT 1.2 05/22/2023 0638   GFRNONAA 45 (L) 05/28/2023 0219   GFRAA >60 02/27/2018 1542       Component Value Date/Time   WBC 7.1 05/27/2023 0147   RBC 2.92 (L) 05/27/2023 0147   HGB 8.9 (L) 05/27/2023 0147   HCT 26.8 (L) 05/27/2023 0147   PLT 130 (L) 05/27/2023 0147   MCV 91.8 05/27/2023 0147   MCH 30.5 05/27/2023 0147   MCHC 33.2 05/27/2023 0147   RDW 14.6 05/27/2023 0147   LYMPHSABS 1.3 05/15/2023 1547   MONOABS 0.8 05/15/2023 1547   EOSABS 0.1 05/15/2023 1547   BASOSABS 0.0 05/15/2023 1547    No results found for: "POCLITH", "LITHIUM"   No results found for: "PHENYTOIN", "PHENOBARB", "VALPROATE", "CBMZ"   .res Assessment: Plan:    Tajah was seen today for follow-up.  Diagnoses and all orders for this visit:  Episodic mood disorder  (HCC)  Insomnia due to mental condition     30 min face to face time with patient was spent on counseling and coordination of care. We discussed Mr. Edghill has not had any mood symptoms in the past several months.  He has had repeated bouts of depression with occasional of hypomania some of which have lasted months.  He has not done well on mood stabilizers as they have been poorly tolerated.  We have discussed the very significant risk of mood instability mood and mood swings since he is not taking a mood stabilizer.  He is willing to take that risk at this time.  He states he will notify me if he starts having hypomania or depression again.  He is aware of the risks that antidepressants can cause mood swings in bipolar patients but he has not been able to get off of the antidepressant without recurrence of depression.  Unfortunately he does not tend to recognize his hypomania very well but the family does recognize it.  he remained stable.  He has a delayed sleep phase but it is not associated with any significant disability.  There  is significant concern that he has had a hypomanic episodes in the past some of which have been reasonably prolonged and he is not on a mood stabilizer.  This has been discussed with he and his wife extensively in previouus appts.  Unfortunately he has not tolerated any of the mood stabilizers that he has taken.  They are aware of the risk of future hypomania.  Fortunately he has not had severe manic episodes but his hypomanic episodes have been to disruptive at times.  He seems stable now.  He has not been able to stop antidepressants without depression recurring and has no interest in stopping antidepressants because of this.  The disability has been far worse from depression in the past and it has been from hypomania.  Tremor stable but not gone  Counseling 20 min: dealing with son with addx and how to get him into treatment.  Disc SS addx.  No confidential info  disc.  Disc general tx plan and options specifically with AA, inpt, outpt, partial hosp etc.  Also disc principles of intervention.  No med changes indicated doing very well.  Continue alprazolam 0.5 mg and mirtazapine 7.5 mg nightly for sleep. Continue sertraline 150 mg daily  Follow-up  6 months or sooner as needed  Meredith Staggers MD, DFAPA  Please see After Visit Summary for patient specific instructions.  Future Appointments  Date Time Provider Department Center  11/08/2023  2:40 PM Rollene Rotunda, MD [facility] [facility]  11/15/2023  1:00 PM [facility]-CT 1 [facility] [place] [facility]  11/28/2023  8:30 AM [facility]POST TREATMENT [facility] None    No orders of the defined types were placed in this encounter.   -------------------------------

## 2023-10-18 NOTE — Telephone Encounter (Signed)
Has appt today

## 2023-11-06 DIAGNOSIS — I493 Ventricular premature depolarization: Secondary | ICD-10-CM | POA: Insufficient documentation

## 2023-11-06 NOTE — Progress Notes (Deleted)
Cardiology Office Note:   Date:  11/06/2023  ID:  Gregory [name], DOB January 26, 1934, MRN [id number] PCP: Barbie Banner, MD  [facility] Providers Cardiologist:  Rollene Rotunda, MD Cardiology APP:  Marcelino Duster, PA {  History of Present Illness:   Gregory [name] is a 89 y.o. 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   He was admitted for NSTEMI 12/23/22 discharged on 1/8. Workup at that time CTA negative PE but noted coronary calcifications and stable infrarenal AAA. Echo LVEF 50-55%, mild hypokinesis of the anterior and anterolateral wall, normal RV, trivial MR, mildly dilated aortic root measuring 42mm.  He had recurrent symptoms and had a second LHC 12/26/22 50% distal LCx, 50% prox-mid LAD. Medical therapy recommended. He was discharged on 12/26/22. Returned 12/28/22 with recurrent NSTEMI. Repeat LHC prox-mid LAD 50%, dist Cx 50%, prox LAD 80% with prox LAD lesion just prior to aneurysmal segment not ideal for percutaneous therapy. Noted if recurrent symptoms consider alternate revascularization such as CABG (LIMA-LAD). He was discharged on Amlodipine and Imdur.     He has a history of remote tobacco use with a right upper lobe lung nodule.  He was being evaluated for possible lobectomy when he presented with unstable angina.  Heart catheterization showed two-vessel disease not amenable to PCI and was referred for evaluation by CT surgery.  He underwent CABG x 2 with LIMA-LAD, SVG-OM2. 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  He has been having radiation for this.    He presents for follow up.  ***   He was admitted in January 2024 with chest pain concerning for angina. At that time heart catheterization on 12/26/2022 showed nonobstructive disease and diffuse calcified vessels.  CTA negative for PE but did show a AAA. He was readmitted 12/29/2022 for similar presentation and repeat heart catheterization in the setting of NSTEMI revealed 75-80% lesion of the septal perforator just before an aneurysmal segment in the proximal LAD. This was not ideal for PCI and continued medical therapy was recommended. He was placed on 5 mg amlodipine and low-dose Imdur.    ***  Since going home on medications he is actually doing well.  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  He has not been having any palpitations, presyncope or syncope.  He is having no weight gain or edema.         ROS: ***  Studies Reviewed:    EKG:       ***  Risk Assessment/Calculations:   {Does this patient have ATRIAL FIBRILLATION?:[phone]} No BP recorded.  {Refresh Note OR Click here to enter BP  :1}***        Physical Exam:   VS:  There were no vitals taken for this visit.   Wt Readings from Last 3 Encounters:  08/08/23 184 lb (83.5 kg)  08/01/23 184 lb 3.2 oz (83.6 kg)  06/27/23 185 lb (83.9 kg)     GEN: Well nourished, well developed in no acute distress NECK: No JVD; No carotid bruits CARDIAC: ***RR, *** murmurs, rubs, gallops RESPIRATORY:  Clear to auscultation without rales, wheezing or rhonchi  ABDOMEN: Soft, non-tender, non-distended EXTREMITIES:  No edema; No deformity   ASSESSMENT AND PLAN:   NSTEMI /  CAD :    ***  He had a long discussion today again about the catheterization and medical management.  He is fine for not having any revascularization given the fact that he is not having symptoms.  He would let me know if he has any recurrent symptoms.   HTN : Blood pressure is ***  running low.  No change in therapy.    Low blood pressure precludes med titration.     PVC :   ***  Flecainide was discontinued at the time of his admission secondary to his known coronary disease.  His PVCs are not bothering  him.  No change in therapy.    Lung nodule : ***  I will follow-up with a noncontrasted CT of his chest.    Hyperglycermia : A1c is ***  6.5.  Management per Barbie Banner, MD   CKD IIIa: Creatinine was *** 1.29 and I will follow this clinically.   AAA (CT 12/23/22 measurement 4x3.6cm):   ***  This was 4.0 x 3.6 cm in January and I will follow-up in 1 year.   Carotid stenosis : He had some very mild plaque.  ***   Follow this clinically and with risk reduction.         Follow up ***  Signed, Rollene Rotunda, MD

## 2023-11-08 ENCOUNTER — Ambulatory Visit: Payer: 59 | Admitting: Cardiology

## 2023-11-08 DIAGNOSIS — I714 Abdominal aortic aneurysm, without rupture, unspecified: Secondary | ICD-10-CM

## 2023-11-08 DIAGNOSIS — I6529 Occlusion and stenosis of unspecified carotid artery: Secondary | ICD-10-CM

## 2023-11-08 DIAGNOSIS — N1831 Chronic kidney disease, stage 3a: Secondary | ICD-10-CM

## 2023-11-08 DIAGNOSIS — I493 Ventricular premature depolarization: Secondary | ICD-10-CM

## 2023-11-08 DIAGNOSIS — I25118 Atherosclerotic heart disease of native coronary artery with other forms of angina pectoris: Secondary | ICD-10-CM

## 2023-11-08 DIAGNOSIS — I10 Essential (primary) hypertension: Secondary | ICD-10-CM

## 2023-11-15 ENCOUNTER — Ambulatory Visit (HOSPITAL_COMMUNITY): Admission: RE | Admit: 2023-11-15 | Payer: PPO | Source: Ambulatory Visit

## 2023-11-20 ENCOUNTER — Telehealth: Payer: Self-pay | Admitting: *Deleted

## 2023-11-20 NOTE — Telephone Encounter (Signed)
Patient's son cancelled this scan on 11-15-23,. and stated that he would call back when he is ready to schedule

## 2023-11-21 ENCOUNTER — Other Ambulatory Visit: Payer: Self-pay | Admitting: Physician Assistant

## 2023-11-24 ENCOUNTER — Telehealth: Payer: Self-pay | Admitting: Cardiology

## 2023-11-24 MED ORDER — METOPROLOL TARTRATE 25 MG PO TABS: 25.0000 mg | ORAL_TABLET | Freq: Two times a day (BID) | ORAL | 2 refills | Status: DC

## 2023-11-24 NOTE — Telephone Encounter (Signed)
*  STAT* If patient is at the pharmacy, call can be transferred to refill team.   1. Which medications need to be refilled? (please list name of each medication and dose if known) metoprolol tartrate (LOPRESSOR) 25 MG tablet   2. Which pharmacy/location (including street and city if local pharmacy) is medication to be sent to?  [id number] - [place], [place] - [address]      3. Do they need a 30 day or 90 day supply? 90 day   Pt is out of medication

## 2023-11-28 ENCOUNTER — Ambulatory Visit
Admission: RE | Admit: 2023-11-28 | Discharge: 2023-11-28 | Disposition: A | Discharge status: Home / Self Care | Payer: PPO | Source: Ambulatory Visit | Attending: Radiation Oncology | Admitting: Radiation Oncology

## 2023-11-28 NOTE — Progress Notes (Signed)
  Radiation Oncology         [phone]) [phone] ________________________________  Name: Gregory [name] MRN: [id number]  Date of Service: 11/28/2023  DOB: [birth year]  Post Treatment Telephone Note  Diagnosis:  Stage IA2, cT1bN0M0, NSCLC, Squamous Cell Carcinoma of the RUL (as documented in provider EOT note)  The patient was available for call today.   Symptoms of fatigue have improved since completing therapy.  Symptoms of skin changes have improved since completing therapy.  Symptoms of esophagitis have improved since completing therapy.  The patient will be set up for a CT-Chest scan for restaging in 6-8 weeks, and was encouraged to call if he develops concerns or questions regarding radiation.   This concludes the interaction.  Ruel Favors, LPN

## 2023-12-04 ENCOUNTER — Ambulatory Visit (HOSPITAL_COMMUNITY)
Admission: RE | Admit: 2023-12-04 | Discharge: 2023-12-04 | Disposition: A | Discharge status: Home / Self Care | Payer: PPO | Source: Ambulatory Visit | Attending: Radiation Oncology | Admitting: Radiation Oncology

## 2023-12-04 ENCOUNTER — Encounter (HOSPITAL_COMMUNITY): Payer: Self-pay | Admitting: Radiology

## 2023-12-04 DIAGNOSIS — C3411 Malignant neoplasm of upper lobe, right bronchus or lung: Secondary | ICD-10-CM | POA: Insufficient documentation

## 2023-12-04 MED ORDER — IOHEXOL 300 MG/ML  SOLN
75.0000 mL | Freq: Once | INTRAMUSCULAR | Status: AC | PRN
  Administered 2023-12-04: 75 mL via INTRAVENOUS

## 2023-12-11 ENCOUNTER — Other Ambulatory Visit: Payer: Self-pay | Admitting: Psychiatry

## 2023-12-11 DIAGNOSIS — F5105 Insomnia due to other mental disorder: Secondary | ICD-10-CM

## 2023-12-20 ENCOUNTER — Other Ambulatory Visit: Payer: Self-pay | Admitting: Physician Assistant

## 2023-12-21 ENCOUNTER — Telehealth: Payer: Self-pay | Admitting: Radiation Oncology

## 2023-12-21 DIAGNOSIS — R9389 Abnormal findings on diagnostic imaging of other specified body structures: Secondary | ICD-10-CM

## 2023-12-21 DIAGNOSIS — C3411 Malignant neoplasm of upper lobe, right bronchus or lung: Secondary | ICD-10-CM

## 2023-12-21 NOTE — Telephone Encounter (Signed)
 I called and spoke with the patient's son Kayin Osment who is his surrogate decision maker and point of contact for our clinic. We discussed the reassuring findings from the CT chest regarding his early stage lung cancer that has been treated with SBRT.   New however is nodular thickening of the esophagus. Ozell states he's unaware of any difficulty his dad is having with swallowing. No known gastroenterology relationship is known to him, nor that I could see in Care Everywhere. We discussed referral to [facility] for further evaluation and to see if they believe he needs EGD. New referral will be placed.   Otherwise we discussed plans for surveillance of his lung cancer in 6 months time with repeat CT chest. Ozell will share this information as well with his father.

## 2023-12-25 NOTE — Progress Notes (Signed)
 Chief Complaint: Abnormal CT of the chest shows esophageal thickening Primary GI Doctor: Dr. 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 Eval for esophageal thickening.   Per Dr Elspeth Millers (Cardiothoracic) note on 08/08/23: He presented with chest pain in January. Catheterization showed mild CAD. CT of the chest showed a 1.5 cm spiculated nodule in the right upper lobe. He was discharged but returned the following day with recurrent chest pain and ruled in for non-STEMI. Repeat catheterization showed a 70 to 80% LAD lesion. He was treated medically.     A follow-up CT showed slight increase in the size of the lung nodule. On PET/CT it was hypermetabolic with an SUV of 6. I did a navigational bronchoscopy in [place]. Biopsy showed inflammation but no definite granulomas. There was no malignancy seen. He was scheduled for surgical resection due to a high index of suspicion but presented with worsening anginal pain. Repeat catheterization showed complex two-vessel disease. He underwent coronary bypass grafting x 2 on 05/22/2023. I did a needle biopsy of the right upper lobe nodule, which showed squamous cell carcinoma. I did not think it was appropriate to attempt resection given his advanced age.  -Patient proceeded with SBRT (3 rounds) with Dr. Dewey in September and completed it October 14th.  -Follow-up CT scan was ordered and decreasing size right upper lobe spiculated nodule,  however patulous esophagus with wall thickening. There is some subtle nodularity along the course of the midthoracic esophagus just below the carina of uncertain etiology. -Plans for surveillance of his lung cancer in 6 months time with repeat CT chest.   Interval History   Patient presents to discuss CT findings of esophageal wall thickening, accompanied by his son. Patient reports he started having issues with swallowing about 6 months which was prior to starting radiation (September). He reports he would chew meat and it would get stuck mid chest and come back up. He would have to chew it again and swallow again. He denies pyrosis or water  brash. Patient not on any PPI therapy and has never been treated for GERD in past.Denies nausea or vomiting. No hematemesis. No unexplained weight loss. No odynophagia or substernal discomfort.  No abdominal pain, altered bowels, or blood in stool. Patient taking Plavix  and baby ASA prescribed by Dr. Elspeth Millers. Denies alcohol or smoking use. Patients last colonoscopy was in 2012 per records. He reports they decided not to repeat colonoscopy due to being on Plavix . Patient's family history includes: his father lived to be 100. Sister lived to be 22. Brother lived to 102. One brother with aneurysm. One brother with lung CA (smoker).   Past Medical History:  Diagnosis Date   Abdominal aneurysm (HCC)    Anginal pain (HCC)    Coronary artery disease    Depression    History of kidney stones    Hyperlipidemia    Hypertension    Myocardial infarction [facility]) 04/19/2023   NSTEMI   Stroke [facility]) 2004?   TIA   Past Surgical  History:  Procedure Laterality Date   COLON SURGERY     polyps removed   COLONOSCOPY     CORONARY ANGIOGRAPHY N/A 12/29/2022   Procedure: CORONARY ANGIOGRAPHY;  Surgeon: Court Dorn PARAS, MD;  Location: [facility] INVASIVE CV LAB;  Service: Cardiovascular;  Laterality: N/A;   CORONARY ARTERY BYPASS GRAFT N/A 05/22/2023   Procedure: CORONARY  ARTERY BYPASS GRAFTING (CABG) X 2 WITH LEFT INTERNAL MAMMARY ARTERY HARVEST AND GREATER SAPHENOUS VEIN HARVESTED ENDOSCOPICALLY;  Surgeon: Kerrin Elspeth BROCKS, MD;  Location: [facility] OR;  Service: Open Heart Surgery;  Laterality: N/A;   EYE SURGERY     cataract right   IR ANGIO INTRA EXTRACRAN SEL COM CAROTID INNOMINATE BILAT MOD SED  02/28/2018   IR ANGIO VERTEBRAL SEL SUBCLAVIAN INNOMINATE UNI R MOD SED  02/28/2018   IR RADIOLOGIST EVAL & MGMT  02/12/2021   LEFT HEART CATH AND CORONARY ANGIOGRAPHY N/A 12/26/2022   Procedure: LEFT HEART CATH AND CORONARY ANGIOGRAPHY;  Surgeon: Court Dorn PARAS, MD;  Location: [facility] INVASIVE CV LAB;  Service: Cardiovascular;  Laterality: N/A;   LEFT HEART CATH AND CORONARY ANGIOGRAPHY N/A 05/16/2023   Procedure: LEFT HEART CATH AND CORONARY ANGIOGRAPHY;  Surgeon: Dann Candyce RAMAN, MD;  Location: [facility] INVASIVE CV LAB;  Service: Cardiovascular;  Laterality: N/A;   OTHER SURGICAL HISTORY     Carotid    RADIOLOGY WITH ANESTHESIA N/A 02/28/2018   Procedure: STENTING;  Surgeon: Dolphus Carrion, MD;  Location: [facility] OR;  Service: Radiology;  Laterality: N/A;   TEE WITHOUT CARDIOVERSION N/A 05/22/2023   Procedure: TRANSESOPHAGEAL ECHOCARDIOGRAM;  Surgeon: Kerrin Elspeth BROCKS, MD;  Location: [facility] OR;  Service: Open Heart Surgery;  Laterality: N/A;   VIDEO BRONCHOSCOPY WITH ENDOBRONCHIAL NAVIGATION N/A 04/27/2023   Procedure: VIDEO BRONCHOSCOPY WITH ENDOBRONCHIAL NAVIGATION W/ BIOPSIES;  Surgeon: Kerrin Elspeth BROCKS, MD;  Location: [facility] OR;  Service: Thoracic;  Laterality: N/A;   Current Outpatient Medications  Medication Sig Dispense Refill   ALPRAZolam  (XANAX ) 0.5 MG tablet Take 0.5 mg by mouth at bedtime. Take 0.5 mg in the morning and 1 mg at bedtime     amLODipine  (NORVASC ) 5 MG tablet Take 5 mg by mouth daily.     aspirin  EC 81 MG tablet Take 81 mg by mouth daily. Swallow whole.     atorvastatin  (LIPITOR ) 80 MG tablet Take 1 tablet (80 mg total) by mouth daily. 90 tablet 3    clopidogrel  (PLAVIX ) 75 MG tablet Take 75 mg by mouth daily.     ferrous sulfate  325 (65 FE) MG EC tablet Take 1 tablet (325 mg total) by mouth daily with breakfast. 60 tablet 0   isosorbide  mononitrate (IMDUR ) 30 MG 24 hr tablet TAKE 1/2 OF A TABLET (15 MG TOTAL) BY MOUTH DAILY 45 tablet 3   mirtazapine  (REMERON ) 7.5 MG tablet TAKE 1 TABLET(7.5 MG) BY MOUTH AT BEDTIME 90 tablet 1   Multiple Vitamins-Minerals (CENTRUM ADULTS PO) Take 1 tablet by mouth daily.      Multiple Vitamins-Minerals (ZINC PO) Take by mouth.     nitroGLYCERIN  (NITROSTAT ) 0.4 MG SL tablet Place 0.4 mg under the tongue as needed for chest pain.     Omega-3 Fatty Acids (FISH OIL PO) Take 1 capsule by mouth daily.     sertraline  (ZOLOFT ) 50 MG tablet TAKE 3 TABLETS(150 MG) BY MOUTH DAILY 270 tablet 2   tamsulosin  (FLOMAX ) 0.4 MG CAPS capsule Take 0.4 mg by mouth at bedtime.     UNABLE TO FIND Take 1 tablet by mouth  daily. Bone supplement     amiodarone  (PACERONE ) 200 MG tablet Take 1 tablet (200 mg total) by mouth daily. (Patient not taking: Reported on 12/26/2023) 90 tablet 3   metoprolol  tartrate (LOPRESSOR ) 25 MG tablet Take 1 tablet (25 mg total) by mouth 2 (two) times daily. (Patient not taking: Reported on 12/26/2023) 90 tablet 2   No current facility-administered medications for this visit.    Allergies as of 12/26/2023   (No Known Allergies)    Family History  Problem Relation Age of Onset   Cancer Mother        breast   Heart disease Mother    Heart disease Brother    Tremor Neg Hx    Review of Systems:    Constitutional: No weight loss, fever, chills, weakness or fatigue HEENT: Eyes: No change in vision               Ears, Nose, Throat:  No change in hearing or congestion Skin: No rash or itching Cardiovascular: No chest pain, chest pressure or palpitations   Respiratory: No SOB or cough Gastrointestinal: See HPI and otherwise negative Genitourinary: No dysuria or change in urinary  frequency Neurological: No headache, dizziness or syncope Musculoskeletal: No new muscle or joint pain Hematologic: No bleeding or bruising Psychiatric: No history of depression or anxiety   Physical Exam:  Vital signs: Ht 5' 9 (1.753 m) Comment: height measured without shoes  Wt 191 lb 6 oz (86.8 kg)   BMI 28.26 kg/m  Constitutional: Pleasant Caucasian male appears to be in NAD, Well developed, Well nourished, alert and cooperative Neck:  Supple Throat: Oral cavity and pharynx without inflammation, swelling or lesion.  Respiratory: Respirations even and unlabored. Lungs clear to auscultation bilaterally.  No wheezes, crackles, or rhonchi.  Cardiovascular: Normal S1, S2. Regular rate and rhythm. No peripheral edema, cyanosis or pallor.  Gastrointestinal: Soft, nondistended, nontender. No rebound or guarding. Normal bowel sounds. No appreciable masses or hepatomegaly. Rectal:  Not performed.  Skin:  Dry and intact without significant lesions or rashes. Psychiatric: Oriented to person, place and time. Demonstrates good judgement and reason without abnormal affect or behaviors.  RELEVANT LABS AND IMAGING:  CBC    Latest Ref Rng & Units 05/27/2023    1:47 AM 05/26/2023    2:23 AM 05/25/2023    4:20 AM  CBC  WBC 4.0 - 10.5 K/uL 7.1  6.4  6.2   Hemoglobin 13.0 - 17.0 g/dL 8.9  9.2  8.8   Hematocrit 39.0 - 52.0 % 26.8  28.4  26.7   Platelets 150 - 400 K/uL 130  106  77     CMP     Latest Ref Rng & Units 06/12/2023    3:59 PM 05/28/2023    2:19 AM 05/27/2023    1:47 AM  CMP  Glucose 70 - 99 mg/dL 96  860  847   BUN 8 - 27 mg/dL 14  27  26    Creatinine 0.76 - 1.27 mg/dL 8.76  8.49  8.53   Sodium 134 - 144 mmol/L 139  135  134   Potassium 3.5 - 5.2 mmol/L 5.0  4.0  3.6   Chloride 96 - 106 mmol/L 101  99  97   CO2 20 - 29 mmol/L 22  24  28    Calcium  8.6 - 10.2 mg/dL 89.9  9.1  8.2     02/21/7974 echo- The left ventricle has normal systolic function, with an  ejection fraction of 55-60%.  12/04/2023 CT Chest with contrast IMPRESSION: Decreasing size right upper lobe spiculated nodule. Fiduciary marker. Developing subtle patchy ground-glass right upper lobe, nonspecific. Attention on follow-up. No developing new lymph node enlargement. Stable right-sided small pleural effusion. Patulous esophagus with wall thickening. There is some subtle nodularity along the course of the midthoracic esophagus just below the carina of uncertain etiology. Please correlate with any symptoms. Additional evaluation when clinically appropriate Aortic Atherosclerosis (ICD10-I70.0) and Emphysema (ICD10-J43.9).  08/28/2023 CT Chest with contrast IMPRESSION: 1. Similar to minimal enlargement of right upper lobe pulmonary nodule. Increased surrounding interstitial thickening which is presumably radiation induced. 2. No thoracic adenopathy or evidence of metastatic disease. 3. Interval median sternotomy for CABG. 4. New trace right pleural fluid. 5. Aortic valvular calcifications. Consider echocardiography to evaluate for valvular dysfunction. 6. Aortic atherosclerosis (ICD10-I70.0) and emphysema (ICD10-J43.9).  04/25/2023 NM PET scan   IMPRESSION: 1. Stable 15 mm right upper lobe pulmonary nodule consistent with primary lung neoplasm. 2. No mediastinal or hilar mass or adenopathy. 3. Stable underlying emphysematous changes and pulmonary scarring and stable advanced vascular disease.  Aortic Atherosclerosis (ICD10-I70.0) and Emphysema (ICD10-J43.9).  02/23/2011 Colonoscopy with Dr. Norleen Hint Impression: Patient into an ileocolonic anastomosis Diverticulosis in the sigmoid colon and in the descending colon.  Encounter Diagnoses  Name Primary?   Abnormal CT of the chest Yes   Esophageal thickening    Esophageal dysphagia    History of lung cancer     Assessment: 89 year old male patient that presents for evaluation of abnormal findings on surveillance imaging for his lung CA that  showed a new finding of esophageal thickening on CT. Patient doses induce esophageal dysphagia that started prior to radiation therapy. This would decrease the incidence of radiation induced esophagitis? But raises the question of other differentials and I would recommend upper GI endoscopy with possible dilatation to rule out stricture, rings, and/or Barretts esophagus. He is not experiencing any odynophagia. We spent several minutes discussing dietary recommendations. We also discussed potentially trialing PPI therapy, however he would like to hold off on medications until we do procedure.   Plan: -Dietary recommendations handed out (soft foods, chew thoroughly, take his time eating) -Schedule EGD with possible dilatation with Dr. Legrand , will request clearance for Plavix  (per Norleen Schillings needs to be done in hospital due to cardiac history and advanced age)   Lovella Hardie, FNP-C [facility] 12/26/2023, 2:18 PM  Cc: Tanda Prentice DEL, MD

## 2023-12-26 ENCOUNTER — Ambulatory Visit: Payer: PPO | Admitting: Gastroenterology

## 2023-12-26 ENCOUNTER — Encounter: Payer: Self-pay | Admitting: Gastroenterology

## 2023-12-26 VITALS — BP 80/50 | HR 52 | Ht 69.0 in | Wt 191.4 lb

## 2023-12-26 DIAGNOSIS — R9389 Abnormal findings on diagnostic imaging of other specified body structures: Secondary | ICD-10-CM

## 2023-12-26 DIAGNOSIS — Z85118 Personal history of other malignant neoplasm of bronchus and lung: Secondary | ICD-10-CM | POA: Diagnosis not present

## 2023-12-26 DIAGNOSIS — K2289 Other specified disease of esophagus: Secondary | ICD-10-CM

## 2023-12-26 DIAGNOSIS — R1310 Dysphagia, unspecified: Secondary | ICD-10-CM

## 2023-12-26 DIAGNOSIS — R1319 Other dysphagia: Secondary | ICD-10-CM

## 2023-12-26 NOTE — Patient Instructions (Signed)
 You have been scheduled for an endoscopy. Please follow written instructions given to you at your visit today.  If you use inhalers (even only as needed), please bring them with you on the day of your procedure.  If you take any of the following medications, they will need to be adjusted prior to your procedure:   DO NOT TAKE 7 DAYS PRIOR TO TEST- Trulicity (dulaglutide) Ozempic, Wegovy (semaglutide) Mounjaro (tirzepatide) Bydureon Bcise (exanatide extended release)  DO NOT TAKE 1 DAY PRIOR TO YOUR TEST Rybelsus (semaglutide) Adlyxin (lixisenatide) Victoza (liraglutide) Byetta (exanatide) ___________________________________________________________________________     Rosine will be contacted by our office prior to your procedure for directions on holding your Plavix .  If you do not hear from our office 1 week prior to your scheduled procedure, please call [phone] to discuss.    Due to recent changes in healthcare laws, you may see the results of your imaging and laboratory studies on MyChart before your provider has had a chance to review them.  We understand that in some cases there may be results that are confusing or concerning to you. Not all laboratory results come back in the same time frame and the provider may be waiting for multiple results in order to interpret others.  Please give us  48 hours in order for your provider to thoroughly review all the results before contacting the office for clarification of your results.    I appreciate the  opportunity to care for you  Thank You   Deanna May,NP

## 2023-12-27 ENCOUNTER — Other Ambulatory Visit: Payer: Self-pay | Admitting: *Deleted

## 2023-12-27 ENCOUNTER — Encounter: Payer: Self-pay | Admitting: *Deleted

## 2023-12-27 ENCOUNTER — Telehealth: Payer: Self-pay | Admitting: *Deleted

## 2023-12-27 MED ORDER — NA SULFATE-K SULFATE-MG SULF 17.5-3.13-1.6 GM/177ML PO SOLN: 1.0000 | Freq: Once | ORAL | 0 refills | Status: DC

## 2023-12-27 NOTE — Progress Notes (Signed)
 Error  called [facility] and cancelled suprep order

## 2023-12-27 NOTE — Telephone Encounter (Signed)
 [facility] Pre-operative Risk Assessment     Request for surgical clearance:     Endoscopy Procedure  What type of surgery is being performed?     Upper Endoscopy with possible dilation   When is this surgery scheduled?     03/04/2024 at [facility]   What type of clearance is required ?   Pharmacy  Are there any medications that need to be held prior to surgery and how long? Plavix   5 days   Practice name and name of physician performing surgery?      [facility]  Dr Victory Brand   What is your office phone and fax number?      Phone- [phone]  Fax- [phone]  Anesthesia type (None, local, MAC, general) ?       MAC   Please route your response to Grayce Loge CMA

## 2023-12-27 NOTE — Telephone Encounter (Signed)
   Name: Gregory [name]  DOB: 07-01-1934  MRN: [id number]  Primary Cardiologist: Lynwood Schilling, MD   Preoperative team, please contact this patient and set up a phone call appointment for further preoperative risk assessment. Please obtain consent and complete medication review. Thank you for your help.  I confirm that guidance regarding antiplatelet and oral anticoagulation therapy has been completed and, if necessary, noted below.  Per office protocol, he may hold Plavix  for 5 days prior to procedure and should resume as soon as hemodynamically stable postoperatively. Patient should continue aspirin  81 mg daily in the perioperative period.   I also confirmed the patient resides in the state of [place] . As per [facility] Medical Board telemedicine laws, the patient must reside in the state in which the provider is licensed.   Barnie Hila, NP 12/27/2023, 11:50 AM [facility]

## 2023-12-27 NOTE — Telephone Encounter (Signed)
 1st attempt to reach pt regarding surgical clearance and the need for an TELE appointment.  Left pt a detailed message to call back and get that scheduled.

## 2023-12-28 NOTE — Progress Notes (Signed)
 ____________________________________________________________  Attending physician addendum:  Thank you for sending this case to me. I have reviewed the entire note and agree with the plan, with a few additions.  I also reviewed the CT images  You will need to communicate with his cardiology clinic to approve a 5-day hold the Plavix  before procedure because he probably needs a dilation performed.  He needs an updated CBC sometime within the next 2 to 3 weeks since the last time we have on file in this EHR it is from June 2024 and he was anemic with a hemoglobin of 8.9  My hospital outpatient procedure blocks are full or nearly full for the next 2 to 3 months.  Please have your clinical assistant look at my schedule for February 4 because it may be feasible to do this man's procedure on that date if they can start my block at 730 instead of the current 8 AM start time.  Victory Brand, MD  ____________________________________________________________

## 2024-01-01 ENCOUNTER — Telehealth: Payer: Self-pay | Admitting: *Deleted

## 2024-01-01 NOTE — Telephone Encounter (Signed)
 S/w the pt and his son. Pt has been scheduled tele preop appt 02/09/24. Med rec and consent are done.     Patient Consent for Virtual Visit        Gregory [name] has provided verbal consent on 01/01/2024 for a virtual visit (video or telephone).   CONSENT FOR VIRTUAL VISIT FOR:  Gregory [name]  By participating in this virtual visit I agree to the following:  I hereby voluntarily request, consent and authorize [facility] and its employed or contracted physicians, physician assistants, nurse practitioners or other licensed health care professionals (the Practitioner), to provide me with telemedicine health care services (the "Services) as deemed necessary by the treating Practitioner. I acknowledge and consent to receive the Services by the Practitioner via telemedicine. I understand that the telemedicine visit will involve communicating with the Practitioner through live audiovisual communication technology and the disclosure of certain medical information by electronic transmission. I acknowledge that I have been given the opportunity to request an in-person assessment or other available alternative prior to the telemedicine visit and am voluntarily participating in the telemedicine visit.  I understand that I have the right to withhold or withdraw my consent to the use of telemedicine in the course of my care at any time, without affecting my right to future care or treatment, and that the Practitioner or I may terminate the telemedicine visit at any time. I understand that I have the right to inspect all information obtained and/or recorded in the course of the telemedicine visit and may receive copies of available information for a reasonable fee.  I understand that some of the potential risks of receiving the Services via telemedicine include:  Delay or interruption in medical evaluation due to technological equipment failure or disruption; Information transmitted may not be  sufficient (e.g. poor resolution of images) to allow for appropriate medical decision making by the Practitioner; and/or  In rare instances, security protocols could fail, causing a breach of personal health information.  Furthermore, I acknowledge that it is my responsibility to provide information about my medical history, conditions and care that is complete and accurate to the best of my ability. I acknowledge that Practitioner's advice, recommendations, and/or decision may be based on factors not within their control, such as incomplete or inaccurate data provided by me or distortions of diagnostic images or specimens that may result from electronic transmissions. I understand that the practice of medicine is not an exact science and that Practitioner makes no warranties or guarantees regarding treatment outcomes. I acknowledge that a copy of this consent can be made available to me via my patient portal [facility] MyChart), or I can request a printed copy by calling the office of  [facility].    I understand that my insurance will be billed for this visit.   I have read or had this consent read to me. I understand the contents of this consent, which adequately explains the benefits and risks of the Services being provided via telemedicine.  I have been provided ample opportunity to ask questions regarding this consent and the Services and have had my questions answered to my satisfaction. I give my informed consent for the services to be provided through the use of telemedicine in my medical care

## 2024-01-01 NOTE — Telephone Encounter (Signed)
 S/w the pt and his son. Pt has been scheduled tele preop appt 02/09/24. Med rec and consent are done.

## 2024-01-29 ENCOUNTER — Encounter: Payer: PPO | Admitting: Internal Medicine

## 2024-02-07 NOTE — Progress Notes (Unsigned)
Virtual Visit via Telephone Note   Because of Gregory [name] co-morbid illnesses, he is at least at moderate risk for complications without adequate follow up.  This format is felt to be most appropriate for this patient at this time.  Due to technical limitations with video connection (technology), today's appointment will be conducted as an audio only telehealth visit, and [name] verbally agreed to proceed in this manner.   All issues noted in this document were discussed and addressed.  No physical exam could be performed with this format.  Evaluation Performed:  Preoperative cardiovascular risk assessment _____________   Date:  02/07/2024   Patient ID:  Gregory [name], DOB October 09, 1934, MRN [id number] Patient Location:  Home Provider location:   Office  Primary Care Provider:  Barbie Banner, MD Primary Cardiologist:  Rollene Rotunda, MD  Chief Complaint / Patient Profile   89 y.o. 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   He is pending upper endoscopy with possible dilatation on 03/04/2024, by Dr. Amada Jupiter, and presents today for telephonic preoperative cardiovascular risk assessment, with recommendations concerning holding Plavix for 5 days.  History of Present Illness    Gregory [name] is a 89 y.o. male who presents via audio/video conferencing for a telehealth visit today.  Pt was last seen in cardiology clinic on 08/01/2019 for by Micah Flesher, PA.  At that time JOHANTHAN KNEELAND was doing well he was continued on current medication regimen.  The patient is now pending procedure as outlined above. Since his last visit, he ***  Past Medical History    Past Medical History:  Diagnosis Date   Abdominal aneurysm (HCC)    Anginal pain (HCC)     Coronary artery disease    Depression    Heart disease    History of kidney stones    Hyperlipidemia    Hypertension    Myocardial infarction (HCC) 04/19/2023   NSTEMI   Stroke [facility]) 2004?   TIA   Past Surgical History:  Procedure Laterality Date   COLONOSCOPY     CORONARY ANGIOGRAPHY N/A 12/29/2022   Procedure: CORONARY ANGIOGRAPHY;  Surgeon: Runell Gess, MD;  Location: [facility] INVASIVE CV LAB;  Service: Cardiovascular;  Laterality: N/A;   CORONARY ARTERY BYPASS GRAFT N/A 05/22/2023   Procedure: CORONARY ARTERY BYPASS GRAFTING (CABG) X 2 WITH LEFT INTERNAL MAMMARY ARTERY HARVEST AND GREATER SAPHENOUS VEIN HARVESTED ENDOSCOPICALLY;  Surgeon: [name], MD;  Location: [facility] OR;  Service: Open Heart Surgery;  Laterality: N/A;   EYE SURGERY     cataract right   IR ANGIO INTRA EXTRACRAN SEL COM CAROTID INNOMINATE BILAT MOD SED  02/28/2018   IR ANGIO VERTEBRAL SEL SUBCLAVIAN INNOMINATE UNI R MOD SED  02/28/2018   IR RADIOLOGIST EVAL & MGMT  02/12/2021   LEFT HEART CATH AND CORONARY ANGIOGRAPHY N/A 12/26/2022   Procedure: LEFT HEART CATH AND CORONARY ANGIOGRAPHY;  Surgeon: Runell Gess, MD;  Location: [facility] INVASIVE CV LAB;  Service: Cardiovascular;  Laterality: N/A;   LEFT HEART CATH AND CORONARY ANGIOGRAPHY N/A 05/16/2023   Procedure: LEFT HEART CATH AND CORONARY ANGIOGRAPHY;  Surgeon: Corky Crafts, MD;  Location: [facility] INVASIVE CV LAB;  Service: Cardiovascular;  Laterality: N/A;   OTHER SURGICAL HISTORY     Carotid  RADIOLOGY WITH ANESTHESIA N/A 02/28/2018   Procedure: STENTING;  Surgeon: Julieanne Cotton, MD;  Location: [facility] OR;  Service: Radiology;  Laterality: N/A;   TEE WITHOUT CARDIOVERSION N/A 05/22/2023   Procedure: TRANSESOPHAGEAL ECHOCARDIOGRAM;  Surgeon: [name], MD;  Location: [facility] OR;  Service: Open Heart Surgery;  Laterality: N/A;   VIDEO BRONCHOSCOPY WITH ENDOBRONCHIAL NAVIGATION N/A 04/27/2023   Procedure: VIDEO BRONCHOSCOPY WITH  ENDOBRONCHIAL NAVIGATION W/ BIOPSIES;  Surgeon: [name], MD;  Location: [facility] OR;  Service: Thoracic;  Laterality: N/A;    Allergies  No Known Allergies  Home Medications    Prior to Admission medications   Medication Sig Start Date End Date Taking? Authorizing Provider  ALPRAZolam Prudy Feeler) 0.5 MG tablet Take 0.5 mg by mouth at bedtime. Take 0.5 mg in the morning and 1 mg at bedtime 03/17/17   [provider]  amiodarone (PACERONE) 200 MG tablet Take 1 tablet (200 mg total) by mouth daily. Patient not taking: Reported on 01/01/2024 08/01/23   Marcelino Duster, PA  amLODipine (NORVASC) 5 MG tablet Take 5 mg by mouth daily. 07/28/23   [provider]  aspirin EC 81 MG tablet Take 81 mg by mouth daily. Swallow whole.    [provider]  atorvastatin (LIPITOR) 80 MG tablet Take 1 tablet (80 mg total) by mouth daily. 12/27/22   Azalee Course, PA  clopidogrel (PLAVIX) 75 MG tablet Take 75 mg by mouth daily. 05/31/23   [provider]  ferrous sulfate 325 (65 FE) MG EC tablet Take 1 tablet (325 mg total) by mouth daily with breakfast. 05/28/23 12/26/23  Ardelle Balls, PA-C  isosorbide mononitrate (IMDUR) 30 MG 24 hr tablet TAKE 1/2 OF A TABLET (15 MG TOTAL) BY MOUTH DAILY 12/21/23   Rollene Rotunda, MD  metoprolol tartrate (LOPRESSOR) 25 MG tablet Take 1 tablet (25 mg total) by mouth 2 (two) times daily. Patient not taking: Reported on 01/01/2024 11/24/23   Rollene Rotunda, MD  mirtazapine (REMERON) 7.5 MG tablet TAKE 1 TABLET(7.5 MG) BY MOUTH AT BEDTIME 12/11/23   Cottle, Steva Ready., MD  Multiple Vitamins-Minerals (CENTRUM ADULTS PO) Take 1 tablet by mouth daily.     [provider]  Multiple Vitamins-Minerals (ZINC PO) Take by mouth.    [provider]  nitroGLYCERIN (NITROSTAT) 0.4 MG SL tablet Place 0.4 mg under the tongue as needed for chest pain.    [provider]  Omega-3 Fatty Acids (FISH OIL PO) Take 1 capsule by mouth  daily.    [provider]  sertraline (ZOLOFT) 50 MG tablet TAKE 3 TABLETS(150 MG) BY MOUTH DAILY 10/18/23   Cottle, Steva Ready., MD  tamsulosin (FLOMAX) 0.4 MG CAPS capsule Take 0.4 mg by mouth at bedtime. 10/24/23   [provider]  UNABLE TO FIND Take 1 tablet by mouth daily. Bone supplement    [provider]    Physical Exam    Vital Signs:  FADIL MACMASTER does not have vital signs available for review today.***  Given telephonic nature of communication, physical exam is limited. AAOx3. NAD. Normal affect.  Speech and respirations are unlabored.  Accessory Clinical Findings    None  Assessment & Plan    1.  Preoperative Cardiovascular Risk Assessment:  According to the Revised Cardiac Risk Index (RCRI), his    His   according to the Duke Activity Status Index (DASI).   Per office protocol, if patient is without any new symptoms or concerns at the  time of their virtual visit, he/she may hold Plavix for 5 days prior to procedure. Please resume Plavix as soon as possible postprocedure, at the discretion of the surgeon.    Therefore, based on ACC/AHA guidelines, patient would be at acceptable risk for the planned procedure without further cardiovascular testing. I will route this recommendation to the requesting party via Epic fax function.   A copy of this note will be routed to requesting surgeon.  Time:   Today, I have spent *** minutes with the patient with telehealth technology discussing medical history, symptoms, and management plan.     Joni Reining, NP  02/07/2024, 3:18 PM

## 2024-02-09 ENCOUNTER — Ambulatory Visit: Payer: PPO | Attending: Cardiology

## 2024-02-09 ENCOUNTER — Telehealth: Payer: Self-pay

## 2024-02-09 NOTE — Telephone Encounter (Signed)
Called patient to reschedule missed tele appointment today no answer left a detailed message to call back and schedule

## 2024-02-26 ENCOUNTER — Encounter (HOSPITAL_COMMUNITY): Payer: Self-pay | Admitting: Gastroenterology

## 2024-02-26 NOTE — Progress Notes (Signed)
 Attempted to obtain medical history for pre op call via telephone, unable to reach at this time. HIPAA compliant voicemail message left requesting return call to pre surgical testing department.

## 2024-02-26 NOTE — Telephone Encounter (Signed)
 Procedure:endo Procedure date: 03/04/24 Procedure location: wl Arrival Time: 815 am Spoke with the patient Y/N: n Any prep concerns? n  Has the patient obtained the prep from the pharmacy ? n Do you have a care partner and transportation: y Any additional concerns? N Talked to his son.

## 2024-02-27 ENCOUNTER — Telehealth: Payer: Self-pay | Admitting: Gastroenterology

## 2024-02-27 NOTE — Telephone Encounter (Signed)
 Pt son Casimiro Needle had questions about pt son recovery time after the EGD. All questions answered. Casimiro Needle verbalized understanding with all questions answered.

## 2024-02-27 NOTE — Telephone Encounter (Signed)
 Inbound call from patient's son requesting a call to discuss recovery time after 3/17 procedure. Please advise, thank you.

## 2024-02-29 ENCOUNTER — Telehealth: Payer: Self-pay | Admitting: Gastroenterology

## 2024-02-29 NOTE — Telephone Encounter (Signed)
 The pt son cancelled the EGD and would like to reschedule.  No appts available at this time. Pt will be called when the June schedule comes out or we have a cancellation.

## 2024-02-29 NOTE — Telephone Encounter (Signed)
 Patient son called and stated that he is needing to reschedule his father EGD. Patient son is requesting a call back. Please advise.

## 2024-03-04 ENCOUNTER — Ambulatory Visit (HOSPITAL_COMMUNITY): Admission: RE | Admit: 2024-03-04 | Payer: PPO | Source: Home / Self Care | Admitting: Gastroenterology

## 2024-03-04 ENCOUNTER — Encounter (HOSPITAL_COMMUNITY): Admission: RE | Payer: Self-pay | Source: Home / Self Care

## 2024-03-04 SURGERY — ESOPHAGOGASTRODUODENOSCOPY (EGD) WITH PROPOFOL
Anesthesia: Monitor Anesthesia Care

## 2024-03-08 NOTE — Telephone Encounter (Signed)
 Spoke with the pt son and rescheduled the pt to 05/27/24 at 730 am at [facility] with Dr Myrtie Neither Instructions have been given over the phone and mailed to the home address and sent to My Chart

## 2024-03-11 NOTE — Progress Notes (Deleted)
 Cardiology Office Note:   Date:  03/11/2024  ID:  Gregory [name], DOB Jun 22, 1934, MRN [id number] PCP: Barbie Banner, MD  [facility] Providers Cardiologist:  Rollene Rotunda, MD Cardiology APP:  Marcelino Duster, PA {  History of Present Illness:   Gregory [name] is a 89 y.o. 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     He was admitted in January 2024 with chest pain concerning for angina.  At that time heart catheterization on 12/26/2022 showed nonobstructive disease and diffuse calcified vessels.  CTA negative for PE but did show a AAA.  He was readmitted 12/29/2022 for similar presentation and repeat heart catheterization in the setting of NSTEMI revealed 75-80% lesion of the septal perforator just before an aneurysmal segment in the proximal LAD.  This was not ideal for PCI and continued medical therapy was recommended.  He was placed on 5 mg amlodipine and low-dose Imdur.   He has a history of remote tobacco use with a right upper lobe lung nodule.  He was being evaluated for possible lobectomy when he presented with unstable angina.  Heart catheterization showed two-vessel disease not amenable to PCI and was referred for evaluation by CT surgery.  He underwent CABG x 2 with LIMA-LAD, SVG-OM2. 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  ***   ***I saw him for follow-up after his CABG on 06/12/2023 with his son who helped with history. He was fatigued but progressing. He is here today for follow up.  He is doing well, will discuss next steps for lung biopsy with surgeon next week.   ROS: ***  Studies Reviewed:    EKG:       ***  Risk Assessment/Calculations:   {Does this patient have ATRIAL FIBRILLATION?:[id number]} No BP recorded.  {Refresh Note OR Click here to enter BP  :1}***         Physical Exam:   VS:  There were no vitals taken for this visit.   Wt Readings from Last 3 Encounters:  12/26/23 191 lb 6 oz (86.8 kg)  08/08/23 184 lb (83.5 kg)  08/01/23 184 lb 3.2 oz (83.6 kg)     GEN: Well nourished, well developed in no acute distress NECK: No JVD; No carotid bruits CARDIAC: ***RR, *** murmurs, rubs, gallops RESPIRATORY:  Clear to auscultation without rales, wheezing or rhonchi  ABDOMEN: Soft, non-tender, non-distended EXTREMITIES:  No edema; No deformity   ASSESSMENT AND PLAN:   CAD s/p CABG x 2 LIMA-LAD, SVG-OM2:  ***   Now on 81 mg ASA Previously on DAPT with aspirin and Plavix Given history of CVA, could make the argument for Plavix monotherapy, but with upcoming possible lobectomy, can hold off and continue 81 mg ASA for now He was discharged without Imdur, Ranexa, amlodipine - no chest pain, walking well   Hypertension:  ***   Prior to CABG was on 30 mg imdur and 5 mg amlodipine Continue 25 mg metoprolol tartrate twice daily Continue 15 mg imdur   Hyperlipidemia:  ***   with LDL goal less than 70 05/16/2023: Cholesterol 104; HDL 33; LDL Cholesterol 44; Triglycerides 133; VLDL 27 Continue 80 mg lipitor   PAF:  ***   Noted postoperatively after CABG (05/2023) 200 mg amiodarone daily-make sure this was reduced off his taper Plan to continue amiodarone  and beta-blocker, will attempt to wean amiodarone in 3 months given underlying lung cancer -Given no recurrence, he was not anticoagulated prior to discharge - Can consider heart monitor to evaluate recurrence -- given the possibility that he may need a lobectomy, will continue 200 mg amiodarone for now   CVA:  ***   Previously on DAPT - now on 81 mg ASA   AAA:  ***   4 x 3.6 cm by CT 12/23/2022 Will need surveillance    Lung cancer:  ***      Follow up ***  Signed, Rollene Rotunda, MD

## 2024-03-13 ENCOUNTER — Ambulatory Visit: Payer: 59 | Admitting: Cardiology

## 2024-03-18 ENCOUNTER — Telehealth: Payer: Self-pay | Admitting: Psychiatry

## 2024-03-18 NOTE — Telephone Encounter (Signed)
 Gregory [name] called at 2:35 to request refill of his Mirtazapine 7.5 mg.  Appt 7/30.  Send to  [id number] - [place], [place] - [address]

## 2024-03-18 NOTE — Telephone Encounter (Signed)
 Confirmed with pharmacy that he has a RF available. Notified patient.

## 2024-04-05 ENCOUNTER — Other Ambulatory Visit: Payer: Self-pay | Admitting: Cardiology

## 2024-04-22 ENCOUNTER — Telehealth: Payer: Self-pay | Admitting: *Deleted

## 2024-04-22 NOTE — Telephone Encounter (Signed)
 Called patient to inform of CT for 06-19-24- arrival time- 12:45 pm @ [facility] Radiology, no restrictions to scan, patient to receive results from Allana Ishikawa on 06-24-24 @ 2 pm via telephone, lvm for a return call

## 2024-04-23 ENCOUNTER — Emergency Department (HOSPITAL_BASED_OUTPATIENT_CLINIC_OR_DEPARTMENT_OTHER): Admitting: Radiology

## 2024-04-23 ENCOUNTER — Other Ambulatory Visit: Payer: Self-pay

## 2024-04-23 ENCOUNTER — Inpatient Hospital Stay (HOSPITAL_BASED_OUTPATIENT_CLINIC_OR_DEPARTMENT_OTHER)
Admission: EM | Admit: 2024-04-23 | Discharge: 2024-04-25 | DRG: 177 | Disposition: A | Discharge status: Home / Self Care | Attending: Family Medicine | Admitting: Family Medicine

## 2024-04-23 ENCOUNTER — Encounter (HOSPITAL_COMMUNITY): Payer: Self-pay | Admitting: Internal Medicine

## 2024-04-23 ENCOUNTER — Emergency Department (HOSPITAL_BASED_OUTPATIENT_CLINIC_OR_DEPARTMENT_OTHER)

## 2024-04-23 DIAGNOSIS — R531 Weakness: Secondary | ICD-10-CM

## 2024-04-23 DIAGNOSIS — E871 Hypo-osmolality and hyponatremia: Secondary | ICD-10-CM | POA: Diagnosis present

## 2024-04-23 DIAGNOSIS — Z79899 Other long term (current) drug therapy: Secondary | ICD-10-CM

## 2024-04-23 DIAGNOSIS — I5032 Chronic diastolic (congestive) heart failure: Secondary | ICD-10-CM | POA: Diagnosis present

## 2024-04-23 DIAGNOSIS — N1832 Chronic kidney disease, stage 3b: Secondary | ICD-10-CM

## 2024-04-23 DIAGNOSIS — Z951 Presence of aortocoronary bypass graft: Secondary | ICD-10-CM

## 2024-04-23 DIAGNOSIS — R0902 Hypoxemia: Secondary | ICD-10-CM | POA: Insufficient documentation

## 2024-04-23 DIAGNOSIS — E782 Mixed hyperlipidemia: Secondary | ICD-10-CM

## 2024-04-23 DIAGNOSIS — U071 COVID-19: Principal | ICD-10-CM

## 2024-04-23 DIAGNOSIS — Z8673 Personal history of transient ischemic attack (TIA), and cerebral infarction without residual deficits: Secondary | ICD-10-CM

## 2024-04-23 DIAGNOSIS — E785 Hyperlipidemia, unspecified: Secondary | ICD-10-CM | POA: Diagnosis present

## 2024-04-23 DIAGNOSIS — I130 Hypertensive heart and chronic kidney disease with heart failure and stage 1 through stage 4 chronic kidney disease, or unspecified chronic kidney disease: Secondary | ICD-10-CM | POA: Diagnosis present

## 2024-04-23 DIAGNOSIS — Z862 Personal history of diseases of the blood and blood-forming organs and certain disorders involving the immune mechanism: Secondary | ICD-10-CM

## 2024-04-23 DIAGNOSIS — N400 Enlarged prostate without lower urinary tract symptoms: Secondary | ICD-10-CM

## 2024-04-23 DIAGNOSIS — I2510 Atherosclerotic heart disease of native coronary artery without angina pectoris: Secondary | ICD-10-CM | POA: Diagnosis present

## 2024-04-23 DIAGNOSIS — Z8249 Family history of ischemic heart disease and other diseases of the circulatory system: Secondary | ICD-10-CM

## 2024-04-23 DIAGNOSIS — I252 Old myocardial infarction: Secondary | ICD-10-CM

## 2024-04-23 DIAGNOSIS — Z7902 Long term (current) use of antithrombotics/antiplatelets: Secondary | ICD-10-CM

## 2024-04-23 DIAGNOSIS — J9601 Acute respiratory failure with hypoxia: Secondary | ICD-10-CM | POA: Diagnosis present

## 2024-04-23 DIAGNOSIS — D631 Anemia in chronic kidney disease: Secondary | ICD-10-CM | POA: Diagnosis present

## 2024-04-23 DIAGNOSIS — R0602 Shortness of breath: Secondary | ICD-10-CM | POA: Diagnosis present

## 2024-04-23 DIAGNOSIS — Z7982 Long term (current) use of aspirin: Secondary | ICD-10-CM

## 2024-04-23 DIAGNOSIS — I10 Essential (primary) hypertension: Secondary | ICD-10-CM | POA: Diagnosis not present

## 2024-04-23 DIAGNOSIS — N189 Chronic kidney disease, unspecified: Secondary | ICD-10-CM

## 2024-04-23 DIAGNOSIS — G9341 Metabolic encephalopathy: Secondary | ICD-10-CM

## 2024-04-23 DIAGNOSIS — R7401 Elevation of levels of liver transaminase levels: Secondary | ICD-10-CM | POA: Diagnosis present

## 2024-04-23 DIAGNOSIS — I440 Atrioventricular block, first degree: Secondary | ICD-10-CM | POA: Diagnosis present

## 2024-04-23 DIAGNOSIS — F411 Generalized anxiety disorder: Secondary | ICD-10-CM

## 2024-04-23 DIAGNOSIS — Z87891 Personal history of nicotine dependence: Secondary | ICD-10-CM | POA: Diagnosis not present

## 2024-04-23 LAB — I-STAT VENOUS BLOOD GAS, ED
Acid-Base Excess: 1 mmol/L (ref 0.0–2.0)
Bicarbonate: 26.9 mmol/L (ref 20.0–28.0)
Calcium, Ion: 1.23 mmol/L (ref 1.15–1.40)
HCT: 35 % — ABNORMAL LOW (ref 39.0–52.0)
Hemoglobin: 11.9 g/dL — ABNORMAL LOW (ref 13.0–17.0)
O2 Saturation: 58 %
Potassium: 4.1 mmol/L (ref 3.5–5.1)
Sodium: 134 mmol/L — ABNORMAL LOW (ref 135–145)
TCO2: 28 mmol/L (ref 22–32)
pCO2, Ven: 46.4 mmHg (ref 44–60)
pH, Ven: 7.371 (ref 7.25–7.43)
pO2, Ven: 32 mmHg (ref 32–45)

## 2024-04-23 LAB — BASIC METABOLIC PANEL WITH GFR
Anion gap: 12 (ref 5–15)
BUN: 20 mg/dL (ref 8–23)
CO2: 24 mmol/L (ref 22–32)
Calcium: 9.5 mg/dL (ref 8.9–10.3)
Chloride: 97 mmol/L — ABNORMAL LOW (ref 98–111)
Creatinine, Ser: 1.67 mg/dL — ABNORMAL HIGH (ref 0.61–1.24)
GFR, Estimated: 39 mL/min — ABNORMAL LOW (ref >60)
Glucose, Bld: 100 mg/dL — ABNORMAL HIGH (ref 70–99)
Potassium: 4.5 mmol/L (ref 3.5–5.1)
Sodium: 132 mmol/L — ABNORMAL LOW (ref 135–145)

## 2024-04-23 LAB — PROCALCITONIN: Procalcitonin: 0.12 ng/mL

## 2024-04-23 LAB — CBC
HCT: 36.7 % — ABNORMAL LOW (ref 39.0–52.0)
Hemoglobin: 12.4 g/dL — ABNORMAL LOW (ref 13.0–17.0)
MCH: 32 pg (ref 26.0–34.0)
MCHC: 33.8 g/dL (ref 30.0–36.0)
MCV: 94.6 fL (ref 80.0–100.0)
Platelets: 126 10*3/uL — ABNORMAL LOW (ref 150–400)
RBC: 3.88 MIL/uL — ABNORMAL LOW (ref 4.22–5.81)
RDW: 13.5 % (ref 11.5–15.5)
WBC: 7.7 10*3/uL (ref 4.0–10.5)
nRBC: 0 % (ref 0.0–0.2)

## 2024-04-23 LAB — LACTIC ACID, PLASMA
Lactic Acid, Venous: 0.8 mmol/L (ref 0.5–1.9)
Lactic Acid, Venous: 1.4 mmol/L (ref 0.5–1.9)

## 2024-04-23 MED ORDER — ALBUTEROL SULFATE (2.5 MG/3ML) 0.083% IN NEBU: 2.5000 mg | INHALATION_SOLUTION | RESPIRATORY_TRACT | Status: DC | PRN

## 2024-04-23 MED ORDER — ONDANSETRON HCL 4 MG/2ML IJ SOLN: 4.0000 mg | Freq: Four times a day (QID) | INTRAMUSCULAR | Status: DC | PRN

## 2024-04-23 MED ORDER — ASPIRIN 81 MG PO TBEC
81.0000 mg | DELAYED_RELEASE_TABLET | Freq: Every day | ORAL | Status: DC
  Administered 2024-04-24 – 2024-04-25 (×2): 81 mg via ORAL
  Filled 2024-04-23 (×2): qty 1

## 2024-04-23 MED ORDER — CLOPIDOGREL BISULFATE 75 MG PO TABS
75.0000 mg | ORAL_TABLET | Freq: Every day | ORAL | Status: DC
  Administered 2024-04-24 – 2024-04-25 (×2): 75 mg via ORAL
  Filled 2024-04-23 (×2): qty 1

## 2024-04-23 MED ORDER — ACETAMINOPHEN 325 MG PO TABS: 650.0000 mg | ORAL_TABLET | Freq: Four times a day (QID) | ORAL | Status: DC | PRN

## 2024-04-23 MED ORDER — DEXAMETHASONE SODIUM PHOSPHATE 4 MG/ML IJ SOLN
4.0000 mg | Freq: Once | INTRAMUSCULAR | Status: AC
  Administered 2024-04-23: 4 mg via INTRAVENOUS
  Filled 2024-04-23: qty 1

## 2024-04-23 MED ORDER — BENZONATATE 100 MG PO CAPS
100.0000 mg | ORAL_CAPSULE | Freq: Three times a day (TID) | ORAL | Status: DC | PRN
  Filled 2024-04-23: qty 1

## 2024-04-23 MED ORDER — METHYLPREDNISOLONE SODIUM SUCC 125 MG IJ SOLR
0.5000 mg/kg | Freq: Two times a day (BID) | INTRAMUSCULAR | Status: DC
  Administered 2024-04-24 – 2024-04-25 (×3): 40.625 mg via INTRAVENOUS
  Filled 2024-04-23 (×3): qty 2

## 2024-04-23 MED ORDER — ATORVASTATIN CALCIUM 40 MG PO TABS
80.0000 mg | ORAL_TABLET | Freq: Every day | ORAL | Status: DC
  Administered 2024-04-24 – 2024-04-25 (×2): 80 mg via ORAL
  Filled 2024-04-23 (×2): qty 2

## 2024-04-23 MED ORDER — MELATONIN 3 MG PO TABS
3.0000 mg | ORAL_TABLET | Freq: Every evening | ORAL | Status: DC | PRN
  Administered 2024-04-24: 3 mg via ORAL
  Filled 2024-04-23: qty 1

## 2024-04-23 MED ORDER — GUAIFENESIN ER 600 MG PO TB12
600.0000 mg | ORAL_TABLET | Freq: Two times a day (BID) | ORAL | Status: DC
  Administered 2024-04-24 – 2024-04-25 (×3): 600 mg via ORAL
  Filled 2024-04-23 (×3): qty 1

## 2024-04-23 MED ORDER — TAMSULOSIN HCL 0.4 MG PO CAPS
0.4000 mg | ORAL_CAPSULE | Freq: Every day | ORAL | Status: DC
  Administered 2024-04-24: 0.4 mg via ORAL
  Filled 2024-04-23: qty 1

## 2024-04-23 MED ORDER — ACETAMINOPHEN 650 MG RE SUPP: 650.0000 mg | Freq: Four times a day (QID) | RECTAL | Status: DC | PRN

## 2024-04-23 MED ORDER — PREDNISONE 50 MG PO TABS: 50.0000 mg | ORAL_TABLET | Freq: Every day | ORAL | Status: DC

## 2024-04-23 MED ORDER — SODIUM CHLORIDE 0.9 % IV BOLUS
500.0000 mL | Freq: Once | INTRAVENOUS | Status: AC
  Administered 2024-04-23: 500 mL via INTRAVENOUS

## 2024-04-23 NOTE — ED Notes (Signed)
 PT was 87% on RA.  PT placed 4L [place] with sats increased to 94%.  HR 50.  No distress noted.  BBS clear and diminished. PA at bedside

## 2024-04-23 NOTE — ED Notes (Signed)
 [facility] at bedside

## 2024-04-23 NOTE — H&P (Signed)
 History and Physical      TRUSTIN FAVORITO ZOX:[id number] DOB: 1934/07/26 DOA: 04/23/2024; DOS: 04/23/2024  PCP: Tura Gaines, MD  Patient coming from: home   I have personally briefly reviewed patient's old medical records in [facility]  Chief Complaint: Shortness of breath  HPI: Gregory [name] is a 89 y.o. male with medical history significant for coronary artery disease status post CABG x 2 in June 2024, chronic diastolic heart failure, CKD 3B with baseline creatinine 1.3-1.6, essential hypertension, who is admitted to [facility] on 04/23/2024 by way of transfer from [facility] with COVID-19 infection complicated by acute hypoxic respiratory failure after presenting from home to the latter facility complaining of shortness of breath.  The patient has been experiencing 2 to 3 days of new onset shortness of breath associated with cough and subjective fever.  Not associated with any chest pain, orthopnea, PND, or worsening of peripheral edema.  No recent wheezing or hemoptysis.  No recent abdominal discomfort, dysuria.  He also reports generalized weakness over similar timeframe, in the absence of any acute focal weakness.  Patient's family also feels that the patient has slightly confused relative to his baseline mental status.  He has a former smoker, but carries no known history of underlying COPD.  No known baseline supplemental oxygen requirements.  His medical history is notable for chronic diastolic heart failure, with most recent echocardiogram in May 2024, which demonstrated LVEF 55 to 60%, evidence of focal motion of maladies, grade 1 diastolic dysfunction, normal right ventricular systolic function and trivial mitral regurgitation.  Not on any diuretic medications at home.  He had presented to the office of his PCP earlier today with the above complaints, at which time COVID-19 testing was highly positive.  At that point, the patient was instructed to present to local  emergency department and subsequently presented to [facility], per these recommendations, for further evaluation management of the above.    [facility] ED Course:  Vital signs in the ED were notable for the following: Temperature max 99.0, heart rates in the 50s; slow blood pressures in the low 100s to 150s; respiratory rate 14-19, oxygen saturation initially 89% on room air, [place] improving in the range of 97 to 98% on 4 L nasal cannula.  Labs were notable for the following: VBG showed 7.371/46.  BMP was notable for the following: Sodium 132 compared to most recent prior sodium did blood of 139 in June 2024, potassium 4.5, bicarbonate 24, creatinine 1.67 compared to 1.50 on 05/28/2023, glucose 100.  Lactic acid 0.8.  CBC notable for white cell count 7700, hemoglobin 12.4 associated Neuraceq/macro properties, and relative to most recent prior hemoglobin data point of 8.9 on 03/27/2023.  COVID-19 testing was positive.  Per my interpretation, EKG in ED demonstrated the following: Sinus bradycardia first-degree AV block heart rate 51, no evidence of T wave or ST changes, including no evidence of ST elevation.  Imaging in the ED, per corresponding formal radiology read, was notable for the following: 1 view chest x-ray showed mild right upper lobe and left basilar scarring versus atelectasis, in the absence of evidence of infiltrate edema, effusion, or pneumothorax.  While in the ED, the following were administered: Decadron  4 mg IV x 1 dose, normal saline x 500 cc bolus.  Subsequently, the patient was admitted to [facility] for further evaluation and management of presenting COVID-19 infection complicated by acute hypoxic respiratory failure as well as acute metabolic encephalopathy, generalized weakness, with presenting  labs also notable for acute hyponatremia.    Review of Systems: As per HPI otherwise 10 point review of systems negative.   Past Medical History:  Diagnosis Date   Abdominal  aneurysm (HCC)    Anginal pain (HCC)    Coronary artery disease    Depression    Heart disease    History of kidney stones    Hyperlipidemia    Hypertension    Myocardial infarction [facility]) 04/19/2023   NSTEMI   Stroke [facility]) 2004?   TIA    Past Surgical History:  Procedure Laterality Date   COLONOSCOPY     CORONARY ANGIOGRAPHY N/A 12/29/2022   Procedure: CORONARY ANGIOGRAPHY;  Surgeon: Avanell Leigh, MD;  Location: [facility] INVASIVE CV LAB;  Service: Cardiovascular;  Laterality: N/A;   CORONARY ARTERY BYPASS GRAFT N/A 05/22/2023   Procedure: CORONARY ARTERY BYPASS GRAFTING (CABG) X 2 WITH LEFT INTERNAL MAMMARY ARTERY HARVEST AND GREATER SAPHENOUS VEIN HARVESTED ENDOSCOPICALLY;  Surgeon: Zelphia Higashi, MD;  Location: [facility] OR;  Service: Open Heart Surgery;  Laterality: N/A;   EYE SURGERY     cataract right   IR ANGIO INTRA EXTRACRAN SEL COM CAROTID INNOMINATE BILAT MOD SED  02/28/2018   IR ANGIO VERTEBRAL SEL SUBCLAVIAN INNOMINATE UNI R MOD SED  02/28/2018   IR RADIOLOGIST EVAL & MGMT  02/12/2021   LEFT HEART CATH AND CORONARY ANGIOGRAPHY N/A 12/26/2022   Procedure: LEFT HEART CATH AND CORONARY ANGIOGRAPHY;  Surgeon: Avanell Leigh, MD;  Location: [facility] INVASIVE CV LAB;  Service: Cardiovascular;  Laterality: N/A;   LEFT HEART CATH AND CORONARY ANGIOGRAPHY N/A 05/16/2023   Procedure: LEFT HEART CATH AND CORONARY ANGIOGRAPHY;  Surgeon: Lucendia Rusk, MD;  Location: [facility] INVASIVE CV LAB;  Service: Cardiovascular;  Laterality: N/A;   OTHER SURGICAL HISTORY     Carotid    RADIOLOGY WITH ANESTHESIA N/A 02/28/2018   Procedure: STENTING;  Surgeon: Luellen Sages, MD;  Location: [facility] OR;  Service: Radiology;  Laterality: N/A;   TEE WITHOUT CARDIOVERSION N/A 05/22/2023   Procedure: TRANSESOPHAGEAL ECHOCARDIOGRAM;  Surgeon: Zelphia Higashi, MD;  Location: [facility] OR;  Service: Open Heart Surgery;  Laterality: N/A;   VIDEO BRONCHOSCOPY WITH ENDOBRONCHIAL NAVIGATION N/A 04/27/2023    Procedure: VIDEO BRONCHOSCOPY WITH ENDOBRONCHIAL NAVIGATION W/ BIOPSIES;  Surgeon: Zelphia Higashi, MD;  Location: [facility] OR;  Service: Thoracic;  Laterality: N/A;    Social History:  reports that he has quit smoking. His smoking use included cigarettes. He has never used smokeless tobacco. He reports that he does not drink alcohol and does not use drugs.   No Known Allergies  Family History  Problem Relation Age of Onset   Breast cancer Mother    Heart disease Mother    Heart disease Brother    Tremor Neg Hx     Family history reviewed and not pertinent    Prior to Admission medications   Medication Sig Start Date End Date Taking? Authorizing Provider  ALPRAZolam  (XANAX ) 0.5 MG tablet Take 0.5 mg by mouth at bedtime. Take 0.5 mg in the morning and 1 mg at bedtime 03/17/17   [provider]  amiodarone  (PACERONE ) 200 MG tablet Take 1 tablet (200 mg total) by mouth daily. Patient not taking: Reported on 01/01/2024 08/01/23   Lamond Pilot, PA  amLODipine  (NORVASC ) 5 MG tablet Take 5 mg by mouth daily. 07/28/23   [provider]  aspirin  EC 81 MG tablet Take 81 mg by mouth daily. Swallow whole.  [provider]  atorvastatin  (LIPITOR ) 80 MG tablet Take 1 tablet (80 mg total) by mouth daily. 12/27/22   Meng, Hao, PA  clopidogrel  (PLAVIX ) 75 MG tablet Take 75 mg by mouth daily. 05/31/23   [provider]  ferrous sulfate  325 (65 FE) MG EC tablet Take 1 tablet (325 mg total) by mouth daily with breakfast. 05/28/23 12/26/23  Allegra Arch, PA-C  isosorbide  mononitrate (IMDUR ) 30 MG 24 hr tablet TAKE 1/2 OF A TABLET (15 MG TOTAL) BY MOUTH DAILY 12/21/23   Eilleen Grates, MD  metoprolol  tartrate (LOPRESSOR ) 25 MG tablet TAKE 1 TABLET BY MOUTH TWICE A DAY 04/05/24   Eilleen Grates, MD  mirtazapine  (REMERON ) 7.5 MG tablet TAKE 1 TABLET(7.5 MG) BY MOUTH AT BEDTIME 12/11/23   Cottle, Kennedy Peabody., MD  Multiple Vitamins-Minerals (CENTRUM ADULTS PO) Take 1  tablet by mouth daily.     [provider]  Multiple Vitamins-Minerals (ZINC PO) Take by mouth.    [provider]  nitroGLYCERIN  (NITROSTAT ) 0.4 MG SL tablet Place 0.4 mg under the tongue as needed for chest pain.    [provider]  Omega-3 Fatty Acids (FISH OIL PO) Take 1 capsule by mouth daily.    [provider]  sertraline  (ZOLOFT ) 50 MG tablet TAKE 3 TABLETS(150 MG) BY MOUTH DAILY 10/18/23   Cottle, Kennedy Peabody., MD  tamsulosin  (FLOMAX ) 0.4 MG CAPS capsule Take 0.4 mg by mouth at bedtime. 10/24/23   [provider]  UNABLE TO FIND Take 1 tablet by mouth daily. Bone supplement    [provider]     Objective    Physical Exam: Vitals:   04/23/24 1900 04/23/24 1945 04/23/24 2000 04/23/24 2135  BP: (!) 120/57 (!) 111/59 128/64 (!) 152/70  Pulse: (!) 55 (!) 53 (!) 52 (!) 57  Resp: 14 16 12 18   Temp:    98.2 F (36.8 C)  TempSrc:      SpO2:  93% 97% 98%  Weight:      Height:        General: appears to be stated age; alert, confused Skin: warm, dry, no rash Head:  AT/[place] Mouth:  Oral mucosa membranes appear moist, normal dentition Neck: supple; trachea midline Heart:  RRR; did not appreciate any M/R/G Lungs: CTAB, did not appreciate any wheezes, rales, or rhonchi Abdomen: + BS; soft, ND, NT Vascular: 2+ pedal pulses b/l; 2+ radial pulses b/l Extremities: no peripheral edema, no muscle wasting Neuro: strength and sensation intact in upper and lower extremities b/l   Labs on Admission: I have personally reviewed following labs and imaging studies  CBC: Recent Labs  Lab 04/23/24 1612 04/23/24 1839  WBC 7.7  --   HGB 12.4* 11.9*  HCT 36.7* 35.0*  MCV 94.6  --   PLT 126*  --    Basic Metabolic Panel: Recent Labs  Lab 04/23/24 1612 04/23/24 1839  NA 132* 134*  K 4.5 4.1  CL 97*  --   CO2 24  --   GLUCOSE 100*  --   BUN 20  --   CREATININE 1.67*  --   CALCIUM  9.5  --    GFR: Estimated Creatinine  Clearance: 34.2 mL/min (A) (by C-G formula based on SCr of 1.67 mg/dL (H)). Liver Function Tests: No results for input(s): "AST", "ALT", "ALKPHOS", "BILITOT", "PROT", "ALBUMIN " in the last 168 hours. No results for input(s): "LIPASE", "AMYLASE" in the last 168 hours. No results for input(s): "AMMONIA" in the last  168 hours. Coagulation Profile: No results for input(s): "INR", "PROTIME" in the last 168 hours. Cardiac Enzymes: No results for input(s): "CKTOTAL", "CKMB", "CKMBINDEX", "TROPONINI" in the last 168 hours. BNP (last 3 results) No results for input(s): "PROBNP" in the last 8760 hours. HbA1C: No results for input(s): "HGBA1C" in the last 72 hours. CBG: No results for input(s): "GLUCAP" in the last 168 hours. Lipid Profile: No results for input(s): "CHOL", "HDL", "LDLCALC", "TRIG", "CHOLHDL", "LDLDIRECT" in the last 72 hours. Thyroid Function Tests: No results for input(s): "TSH", "T4TOTAL", "FREET4", "T3FREE", "THYROIDAB" in the last 72 hours. Anemia Panel: No results for input(s): "VITAMINB12", "FOLATE", "FERRITIN", "TIBC", "IRON", "RETICCTPCT" in the last 72 hours. Urine analysis:    Component Value Date/Time   COLORURINE YELLOW 05/22/2023 0640   APPEARANCEUR CLEAR 05/22/2023 0640   LABSPEC 1.010 05/22/2023 0640   PHURINE 6.0 05/22/2023 0640   GLUCOSEU NEGATIVE 05/22/2023 0640   HGBUR NEGATIVE 05/22/2023 0640   BILIRUBINUR NEGATIVE 05/22/2023 0640   KETONESUR NEGATIVE 05/22/2023 0640   PROTEINUR NEGATIVE 05/22/2023 0640   NITRITE NEGATIVE 05/22/2023 0640   LEUKOCYTESUR NEGATIVE 05/22/2023 0640    Radiological Exams on Admission: DG Chest Port 1 View Result Date: 04/23/2024 CLINICAL DATA:  COVID positive with increasing weakness and shortness of breath. EXAM: PORTABLE CHEST 1 VIEW COMPARISON:  June 20, 2023 FINDINGS: Multiple sternal wires and vascular clips are noted. The heart size and mediastinal contours are within normal limits. There is moderate to marked severity  calcification of the aortic arch. Mild, chronic appearing increased lung markings are seen with mild scarring and/or atelectasis present within the right upper lobe and left lung base. No pleural effusion or pneumothorax is identified. Multilevel degenerative changes are seen throughout the thoracic spine. IMPRESSION: 1. Evidence of prior median sternotomy/CABG. 2. Mild right upper lobe and left basilar scarring and/or atelectasis. Electronically Signed   By: Virgle Grime M.D.   On: 04/23/2024 17:06      Assessment/Plan    Principal Problem:   COVID-19 virus infection Active Problems:   Essential hypertension   Hyperlipidemia   Acute hypoxic respiratory failure (HCC)   Acute metabolic encephalopathy   Acute hyponatremia   Generalized weakness   CKD stage 3b, GFR 30-44 ml/min (HCC)   GAD (generalized anxiety disorder)   BPH (benign prostatic hyperplasia)   Chronic diastolic CHF (congestive heart failure) (HCC)   History of anemia due to chronic kidney disease     #) Severe COVID-19 infection: dx on the basis of 2 to 3 days of new onset shortness of breath, cough, generalized myalgias, with COVID-19 testing performed today found to be positive. Additionally, in context of no known baseline supplemental O2 requirements, the pt  is requiring 4 L nasal cannula in order to maintain O2 sats greater than or equal to 94%. In setting of this acute hypoxia, criteria are met for patient's COVID-19 infxn to be considered severe in nature. Consequently, there is a Grade 2c rec for systemic corticosteroids.  He received a dose of Decadron  [facility] earlier today.  Will continue systemic corticosteroids, as further outlined below.  CXR shows no evidence of acute cardiopulmonary process, including no evidence of infiltrate.   as anti-viral medications, including remdesivir, Paxlovid, and molnupiravir, have showed limited benefit in the treatment of COVID-19 infection, including limited benefit in  reducing the probability for progression of the severity associated with this infection, will refrain from initiation of any of these antiviral medications at this time.   No known chronic underlying pulmonary  pathology. No known h/o diabetes .   Of note, in the absence of objective fever and in the absence of leukocytosis, SIRS criteria for sepsis are not currently met.  Plan: Airborne and contact precautions. prn supplemental O2 to maintain O2 sats greater than or equal to 94%. Proning protocol initiated. PRN albuterol  nebulizer. PRN acetaminophen  for fever. Start solumedrol at half milligram per kilogram IV twice daily x 3 days followed by transition to prednisone, as above. Refrain from anti-virals, as above. Check CRP and repeat in the AM. Check serum mag and phos levels. Check CMP/CBC in the AM. Check procalcitonin, which, if non-elevated in the context of the pro inflammatory state associated with COVID-19, would provide a high degree of negative predictive value that would further decrease the likelihood of any contribution from bacterial pneumonia.                    #) Acute hypoxic respiratory failure: in the context of no known baseline supplemental oxygen requirements, presenting O2 sat was noted to be in the high 80s subsequently improving into the range of 97 to 90% on 4 L nasal cannula. Appears to be on the basis of COVID-19 infection, as above. No known chronic underlying pulmonary conditions. ACS is felt to be less likely at this time in the absence of any recent chest pain and in the context of presenting EKG showing no evidence of acute ischemic process. No clinical or radiographic evidence of suggest acutely decompensated heart failure at this time. Will also check bnp given a h/o chronic diastolic heart failure.  While there is increased risk for acute PE in the setting of COVID-19 infection, clinically, this appears to be less likely at this time.  No evidence of  infiltrate on presenting chest x-ray, but will add on procalcitonin level.   Plan: further evaluation and management of presenting COVID-19 infection, as above. monitor on telemetry. Trending of inflammatory markers, as above. Check CMP and CBC in the morning. Check serum Mg and Phos levels. PRN albuterol  nebulizer. Solumedrol, as above.  Check BNP.  Check procalcitonin level.                    #) Acute hypoosmolar hyponatremia: Presenting sodium level found to be 132 compared to most recent provide 139 in June 2024.  Differential includes increased risk for SIADH in the context of his presenting acute COVID-19 infection, with chest x-ray showing no evidence of acute cardiopulmonary process, and procalcitonin level currently pending.  Differential also includes potential contribution from dehydration given ostensible losses in the setting of this acute infection.  He has received a 500 cc NS bolus at [facility] earlier today.  Will pursue further diagnostic evaluation, as further outlined below, will refrain from additional IV fluids at this time.  While he does have a history of chronic diastolic heart failure, there is no clinical or radiographic evidence at this time to suggest acutely decompensated heart failure.  Potential outpatient contributing pharmacologic factors include outpatient Zoloft .  Plan: Check urinalysis.  Check random urine sodium, random urine creatinine, random urine osmolality.  Check serum osmolality to confirm suspected hypoosmolar source.  Check TSH, uric acid level.  Check TSH.  Monitor strict I's and O's and daily weights.  Further evaluation management of presenting COVID-19 infection, as above.  Follow-up for result of procalcitonin level.  CMP in the morning.  Add on BNP.  Hold home Zoloft  for now.                    #)  Acute metabolic encephalopathy: reports of 1 to 2 days of confusion relative to baseline mental status, which appears to be  on the basis of physiologic stressors stemming from presenting COVID-19 infection complicated by acute hypoxic respiratory failure.  Potential additional contributions include new finding of acute hyponatremia, as above. no obvious additional contributory underlying infectious process at this time, but will also check urinalysis.  Presenting VBG showed no evidence of hypercapnia. No overt acute focal neurologic deficits to suggest a contribution from an underlying acute CVA.  Potential outpatient pharmacologic contributions include multiple centrally acting medications.  These include scheduled Xanax  as well as Zoloft .  Plan: fall precautions. Delirium precautions. Repeat CMP/CBC in the AM. Check Mg level. check TSH, B12 level.  Check urinalysis.  Check INR to evaluate hepatic synthetic function.  Further evaluation management presenting COVID-19 infection as well as acute hyponatremia, as above.  Hold home scheduled Xanax  as well as Zoloft .                      #) Generalized weakness: 2 to 3-day duration of generalized weakness, in the absence of any evidence of acute focal neurologic deficits, including no evidence of acute focal weakness to suggest acute CVA.  Suspect contribution from physiologic stress stemming from presenting COVID-19 infection distress, as well as potential hydration from acute hyponatremia.    Will further eval for any additional contributions from endocrine/metabolic sources, as detailed below.   Plan: work-up and management of presenting COVID-19 infection, acute hypoxic respiratory failure, and acute hyponatremia, as described above. PT/OT consults ordered for the AM. Fall precautions. CMP/CBC in the AM. Check TSH, serum Mg level. Check B12 level, urinalysis.Aaron Aas                     #) CKD Stage 3B: Documented history of such, with baseline creatinine 1.3-1.6, with presenting creatinine consistent with this baseline.    Plan: Monitor strict  I's and O's and daily weights.  Attempt to avoid nephrotoxic agents.  CMP/magnesium  level in the AM.                     #) Essential Hypertension: documented h/o such, with outpatient antihypertensive regimen including amlodipine , Lopressor , Imdur .  SBP's in the ED today: Low 100s to 150s mmHg. in the setting of presenting acute infection as well as initial soft blood pressures, will hold next doses of outpatient hypertensive medications for now.  Additionally, in the setting of presenting sinus bradycardia with first-degree AV block, will hold home Lopressor  for now, closely monitor and sitting heart rate trend on telemetry.  Plan: Close monitoring of subsequent BP via routine VS. hold home antihypertensive medications for now, as above.  Monitor strict I's and O's and daily weights.  Monitor on telemetry.                      #) Hyperlipidemia: documented h/o such. On high intensity atorvastatin  as outpatient.   Plan: continue home statin.                      #) Generalized anxiety disorder: documented h/o such. On Zoloft  as well as scheduled Xanax  on a twice daily basis as outpatient.  In the setting of the patient's acute encephalopathy, will hold these home central acting medications for now.  Plan: Hold home Xanax  as well as Zoloft  for now.                     #)  Benign Prostatic Hyperplasia:  documented h/o such; on tamsulosin  as outpatient.   Plan: monitor strict I's & O's and daily weights. Repeat CMP in AM.  continue home tamsulosin .                  #) Chronic diastolic heart failure: documented history of such, with most recent echocardiogram performed in May 2024, which was notable for LVEF 55 to 60%, grade 1 diastolic dysfunction, with additional results as conveyed above. No clinical or radiographic evidence to suggest acutely decompensated heart failure at this time. home diuretic regimen  reportedly consists of the following: None.   Plan: monitor strict I's & O's and daily weights. Repeat CMP in AM. Check serum mag level.  Check BNP.                  #) Anemia of chronic kidney disease: Documented history of such, a/w with baseline hgb range 9-12, with presenting hgb consistent with this range, in the absence of any overt evidence of active bleed.     Plan: Repeat CBC in the morning.        DVT prophylaxis: SCD's   Code Status: Full code Family Communication: none Disposition Plan: Per Rounding Team Consults called: none;  Admission status: Inpatient    I SPENT GREATER THAN 75  MINUTES IN CLINICAL CARE TIME/MEDICAL DECISION-MAKING IN COMPLETING THIS ADMISSION.     Arora Coakley B Krishang Reading DO [facility] From 7PM - 7AM   04/23/2024, 10:27 PM

## 2024-04-23 NOTE — Plan of Care (Signed)
 Signout received from [facility] emergency team.  89 year old with medical history significant for coronary disease status post two-vessel CABG, hypertension, hyperlipidemia, right MCA stroke, stage III CKD, carotid artery disease status post stenting and remote history of tobacco use.  He presents on account of acute shortness of breath with hypoxia with acute altered mental status from baseline.  Chest x-ray however not significant for new infiltrates suggesting pneumonia or vascular congestion.  Patient has no known history of COPD.  Incidentally tested positive for COVID in the ED.  He is hypoxic has required oxygen supplementation at 4 L/min.  He has been referred to the hospital for admission and further management of hypoxia presumably attributed to COVID infection.

## 2024-04-23 NOTE — ED Provider Notes (Signed)
 [place] EMERGENCY DEPARTMENT AT [facility] Provider Note   CSN: [id number] Arrival date & time: 04/23/24  1549     History  No chief complaint on file.   Gregory [name] is a 89 y.o. male.  Patient history of remote tobacco use, two vessel CABG (2024), hypertension, hyperlipidemia, right MCA stroke, non-ST elevation MI, CAD, stage IIIa chronic kidney disease, carotid disease s/p stenting, and a right upper lobe lung nodule --presents the emergency department for evaluation of hypoxia and new diagnosis of COVID.  Patient began having symptoms 2 days ago.  This has included a productive cough, low-grade fever.  Per patient's son, lives with the patient, he has been a little bit confused, sometimes saying things which do not make sense.  He has not been in any respiratory distress but does appear to be more short of breath with activity.  They had been seen at PCP today.  Oxygen level noted to be low and referred to the emergency department for evaluation.  Patient has not been on supplemental oxygen before.  No history of COPD, asthma or heart failure.       Home Medications Prior to Admission medications   Medication Sig Start Date End Date Taking? Authorizing Provider  ALPRAZolam  (XANAX ) 0.5 MG tablet Take 0.5 mg by mouth at bedtime. Take 0.5 mg in the morning and 1 mg at bedtime 03/17/17   [provider]  amiodarone  (PACERONE ) 200 MG tablet Take 1 tablet (200 mg total) by mouth daily. Patient not taking: Reported on 01/01/2024 08/01/23   Lamond Pilot, PA  amLODipine  (NORVASC ) 5 MG tablet Take 5 mg by mouth daily. 07/28/23   [provider]  aspirin  EC 81 MG tablet Take 81 mg by mouth daily. Swallow whole.    [provider]  atorvastatin  (LIPITOR ) 80 MG tablet Take 1 tablet (80 mg total) by mouth daily. 12/27/22   Meng, Hao, PA  clopidogrel  (PLAVIX ) 75 MG tablet Take 75 mg by mouth daily. 05/31/23   [provider]  ferrous sulfate  325  (65 FE) MG EC tablet Take 1 tablet (325 mg total) by mouth daily with breakfast. 05/28/23 12/26/23  Moira Andrews M, PA-C  isosorbide  mononitrate (IMDUR ) 30 MG 24 hr tablet TAKE 1/2 OF A TABLET (15 MG TOTAL) BY MOUTH DAILY 12/21/23   Eilleen Grates, MD  metoprolol  tartrate (LOPRESSOR ) 25 MG tablet TAKE 1 TABLET BY MOUTH TWICE A DAY 04/05/24   Eilleen Grates, MD  mirtazapine  (REMERON ) 7.5 MG tablet TAKE 1 TABLET(7.5 MG) BY MOUTH AT BEDTIME 12/11/23   Cottle, Kennedy Peabody., MD  Multiple Vitamins-Minerals (CENTRUM ADULTS PO) Take 1 tablet by mouth daily.     [provider]  Multiple Vitamins-Minerals (ZINC PO) Take by mouth.    [provider]  nitroGLYCERIN  (NITROSTAT ) 0.4 MG SL tablet Place 0.4 mg under the tongue as needed for chest pain.    [provider]  Omega-3 Fatty Acids (FISH OIL PO) Take 1 capsule by mouth daily.    [provider]  sertraline  (ZOLOFT ) 50 MG tablet TAKE 3 TABLETS(150 MG) BY MOUTH DAILY 10/18/23   Cottle, Kennedy Peabody., MD  tamsulosin  (FLOMAX ) 0.4 MG CAPS capsule Take 0.4 mg by mouth at bedtime. 10/24/23   [provider]  UNABLE TO FIND Take 1 tablet by mouth daily. Bone supplement    [provider]      Allergies    Patient has no known allergies.    Review of  Systems   Review of Systems  Physical Exam Updated Vital Signs BP (!) 107/52 (BP Location: Right Arm)   Pulse (!) 51   Temp 99 F (37.2 C)   Resp 19   Ht 6' (1.829 m)   Wt 81.6 kg   SpO2 (!) 89%   BMI 24.41 kg/m  Physical Exam Vitals and nursing note reviewed.  Constitutional:      General: He is not in acute distress.    Appearance: He is well-developed.  HENT:     Head: Normocephalic and atraumatic.     Right Ear: External ear normal.     Left Ear: External ear normal.     Ears:     Comments: Patient hard of hearing    Nose: Nose normal.     Mouth/Throat:     Mouth: Mucous membranes are moist.  Eyes:     General:        Right eye: No  discharge.        Left eye: No discharge.     Conjunctiva/sclera: Conjunctivae normal.  Cardiovascular:     Rate and Rhythm: Normal rate and regular rhythm.     Heart sounds: Normal heart sounds.  Pulmonary:     Effort: Pulmonary effort is normal.     Breath sounds: Examination of the right-middle field reveals rales. Examination of the right-lower field reveals rales. Rales present.  Abdominal:     Palpations: Abdomen is soft.     Tenderness: There is no abdominal tenderness.  Musculoskeletal:     Cervical back: Normal range of motion and neck supple.  Skin:    General: Skin is warm and dry.  Neurological:     Mental Status: He is alert.     ED Results / Procedures / Treatments   Labs (all labs ordered are listed, but only abnormal results are displayed) Labs Reviewed  BASIC METABOLIC PANEL WITH GFR - Abnormal; Notable for the following components:      Result Value   Sodium 132 (*)    Chloride 97 (*)    Glucose, Bld 100 (*)    Creatinine, Ser 1.67 (*)    GFR, Estimated 39 (*)    All other components within normal limits  CBC - Abnormal; Notable for the following components:   RBC 3.88 (*)    Hemoglobin 12.4 (*)    HCT 36.7 (*)    Platelets 126 (*)    All other components within normal limits  I-STAT VENOUS BLOOD GAS, ED - Abnormal; Notable for the following components:   Sodium 134 (*)    HCT 35.0 (*)    Hemoglobin 11.9 (*)    All other components within normal limits  LACTIC ACID, PLASMA  LACTIC ACID, PLASMA    ED ECG REPORT   Date: 04/23/2024  Rate: 51  Rhythm: sinus bradycardia  QRS Axis: normal  Intervals: PR prolonged  ST/T Wave abnormalities: nonspecific ST changes  Conduction Disutrbances:first-degree A-V block   Narrative Interpretation:   Old EKG Reviewed: unchanged  I have personally reviewed the EKG tracing and agree with the computerized printout as noted.   Radiology DG Chest Port 1 View Result Date: 04/23/2024 CLINICAL DATA:  COVID  positive with increasing weakness and shortness of breath. EXAM: PORTABLE CHEST 1 VIEW COMPARISON:  June 20, 2023 FINDINGS: Multiple sternal wires and vascular clips are noted. The heart size and mediastinal contours are within normal limits. There is moderate to marked severity calcification of the aortic arch. Mild,  chronic appearing increased lung markings are seen with mild scarring and/or atelectasis present within the right upper lobe and left lung base. No pleural effusion or pneumothorax is identified. Multilevel degenerative changes are seen throughout the thoracic spine. IMPRESSION: 1. Evidence of prior median sternotomy/CABG. 2. Mild right upper lobe and left basilar scarring and/or atelectasis. Electronically Signed   By: Virgle Grime M.D.   On: 04/23/2024 17:06    Procedures Procedures    Medications Ordered in ED Medications - No data to display  ED Course/ Medical Decision Making/ A&P    Patient seen and examined. History obtained directly from patient and son at bedside. Reviewed dc summary and recent labs.  Baseline hemoglobin 13.  Hypoxic to 87% at rest on room air on arrival.  Labs/EKG: Ordered CBC with normal white blood cell count, hemoglobin 12.4 otherwise unremarkable; BMP creatinine elevated slightly from baseline at 1.67 within normal BUN, sodium 132, chloride 97, glucose 100; lactate 0.8.  Added i-STAT VBG  Imaging: Ordered chest x-ray  Medications/Fluids: None ordered  Most recent vital signs reviewed and are as follows: BP (!) 107/52 (BP Location: Right Arm)   Pulse (!) 51   Temp 99 F (37.2 C)   Resp 19   Ht 6' (1.829 m)   Wt 81.6 kg   SpO2 (!) 89%   BMI 24.41 kg/m   Initial impression: Hypoxic respiratory failure due to COVID-19  6:09 PM Reassessment performed. Patient appears stable.  I had to explain to him again that he had COVID and needed to stay in the hospital.  Son at bedside, agrees with plan.  Continues to be on 4 L, O2 sat in the mid  90s.  Labs personally reviewed and interpreted including: Continuing to await VBG  Imaging personally visualized and interpreted including: Chest x-ray, gree no dense infiltrates  Reviewed pertinent lab work and imaging with patient at bedside. Questions answered.   Most current vital signs reviewed and are as follows: BP 104/61   Pulse (!) 49   Temp 99 F (37.2 C)   Resp 17   Ht 6' (1.829 m)   Wt 81.6 kg   SpO2 96%   BMI 24.41 kg/m   Plan: Admission to hospital  6:53 PM Discussed case with Dr. Cicero Crawley with [facility], plan for admit.                                  Medical Decision Making Amount and/or Complexity of Data Reviewed Labs: ordered. Radiology: ordered.   Patient with COVID-19 infection with hypoxia, some confusion and unsteadiness likely related to infection.  No significant pneumonia on CXR currently.  Do not suspect fluid overload.  Appears comfortable on 4 L nasal cannula currently.  Will admit for continued treatment and monitoring.        Final Clinical Impression(s) / ED Diagnoses Final diagnoses:  Acute hypoxic respiratory failure [facility])  COVID-19    Rx / DC Orders ED Discharge Orders     None         Lyna Sandhoff, PA-C 04/23/24 1854    Sallyanne Creamer, DO 04/26/24 [phone]

## 2024-04-23 NOTE — ED Triage Notes (Signed)
 Pt POV from UC reporting increased weakness and SOB past few days, covid+, SPO2 87% RA in triage.

## 2024-04-24 DIAGNOSIS — I5032 Chronic diastolic (congestive) heart failure: Secondary | ICD-10-CM | POA: Diagnosis present

## 2024-04-24 DIAGNOSIS — F411 Generalized anxiety disorder: Secondary | ICD-10-CM | POA: Diagnosis present

## 2024-04-24 DIAGNOSIS — N189 Chronic kidney disease, unspecified: Secondary | ICD-10-CM

## 2024-04-24 DIAGNOSIS — G9341 Metabolic encephalopathy: Secondary | ICD-10-CM | POA: Diagnosis present

## 2024-04-24 DIAGNOSIS — N1832 Chronic kidney disease, stage 3b: Secondary | ICD-10-CM | POA: Diagnosis present

## 2024-04-24 DIAGNOSIS — E871 Hypo-osmolality and hyponatremia: Secondary | ICD-10-CM | POA: Diagnosis present

## 2024-04-24 DIAGNOSIS — R531 Weakness: Secondary | ICD-10-CM

## 2024-04-24 DIAGNOSIS — U071 COVID-19: Secondary | ICD-10-CM | POA: Diagnosis not present

## 2024-04-24 DIAGNOSIS — J9601 Acute respiratory failure with hypoxia: Principal | ICD-10-CM | POA: Diagnosis present

## 2024-04-24 DIAGNOSIS — N400 Enlarged prostate without lower urinary tract symptoms: Secondary | ICD-10-CM | POA: Diagnosis present

## 2024-04-24 LAB — CBC WITH DIFFERENTIAL/PLATELET
Abs Immature Granulocytes: 0.03 10*3/uL (ref 0.00–0.07)
Basophils Absolute: 0 10*3/uL (ref 0.0–0.1)
Basophils Relative: 0 %
Eosinophils Absolute: 0 10*3/uL (ref 0.0–0.5)
Eosinophils Relative: 0 %
HCT: 37.6 % — ABNORMAL LOW (ref 39.0–52.0)
Hemoglobin: 12 g/dL — ABNORMAL LOW (ref 13.0–17.0)
Immature Granulocytes: 1 %
Lymphocytes Relative: 12 %
Lymphs Abs: 0.7 10*3/uL (ref 0.7–4.0)
MCH: 31.3 pg (ref 26.0–34.0)
MCHC: 31.9 g/dL (ref 30.0–36.0)
MCV: 98.2 fL (ref 80.0–100.0)
Monocytes Absolute: 0.4 10*3/uL (ref 0.1–1.0)
Monocytes Relative: 7 %
Neutro Abs: 4.8 10*3/uL (ref 1.7–7.7)
Neutrophils Relative %: 80 %
Platelets: 111 10*3/uL — ABNORMAL LOW (ref 150–400)
RBC: 3.83 MIL/uL — ABNORMAL LOW (ref 4.22–5.81)
RDW: 13.6 % (ref 11.5–15.5)
WBC: 6 10*3/uL (ref 4.0–10.5)
nRBC: 0 % (ref 0.0–0.2)

## 2024-04-24 LAB — URINALYSIS, COMPLETE (UACMP) WITH MICROSCOPIC
Bilirubin Urine: NEGATIVE
Glucose, UA: 150 mg/dL — AB
Hgb urine dipstick: NEGATIVE
Ketones, ur: NEGATIVE mg/dL
Nitrite: NEGATIVE
Protein, ur: NEGATIVE mg/dL
Specific Gravity, Urine: 1.018 (ref 1.005–1.030)
pH: 5 (ref 5.0–8.0)

## 2024-04-24 LAB — COMPREHENSIVE METABOLIC PANEL WITH GFR
ALT: 209 U/L — ABNORMAL HIGH (ref 0–44)
AST: 270 U/L — ABNORMAL HIGH (ref 15–41)
Albumin: 3.3 g/dL — ABNORMAL LOW (ref 3.5–5.0)
Alkaline Phosphatase: 77 U/L (ref 38–126)
Anion gap: 9 (ref 5–15)
BUN: 22 mg/dL (ref 8–23)
CO2: 22 mmol/L (ref 22–32)
Calcium: 8.7 mg/dL — ABNORMAL LOW (ref 8.9–10.3)
Chloride: 97 mmol/L — ABNORMAL LOW (ref 98–111)
Creatinine, Ser: 1.2 mg/dL (ref 0.61–1.24)
GFR, Estimated: 59 mL/min — ABNORMAL LOW (ref >60)
Glucose, Bld: 193 mg/dL — ABNORMAL HIGH (ref 70–99)
Potassium: 4.1 mmol/L (ref 3.5–5.1)
Sodium: 128 mmol/L — ABNORMAL LOW (ref 135–145)
Total Bilirubin: 1.2 mg/dL (ref 0.0–1.2)
Total Protein: 7 g/dL (ref 6.5–8.1)

## 2024-04-24 LAB — OSMOLALITY, URINE: Osmolality, Ur: 576 mosm/kg (ref 300–900)

## 2024-04-24 LAB — C-REACTIVE PROTEIN
CRP: 7.6 mg/dL — ABNORMAL HIGH (ref <1.0)
CRP: 8 mg/dL — ABNORMAL HIGH (ref <1.0)

## 2024-04-24 LAB — VITAMIN B12: Vitamin B-12: 540 pg/mL (ref 180–914)

## 2024-04-24 LAB — PROTIME-INR
INR: 1.1 (ref 0.8–1.2)
Prothrombin Time: 14.1 s (ref 11.4–15.2)

## 2024-04-24 LAB — URIC ACID: Uric Acid, Serum: 4.2 mg/dL (ref 3.7–8.6)

## 2024-04-24 LAB — PHOSPHORUS: Phosphorus: 3.5 mg/dL (ref 2.5–4.6)

## 2024-04-24 LAB — TSH: TSH: 2.467 u[IU]/mL (ref 0.350–4.500)

## 2024-04-24 LAB — MAGNESIUM: Magnesium: 2 mg/dL (ref 1.7–2.4)

## 2024-04-24 LAB — BRAIN NATRIURETIC PEPTIDE: B Natriuretic Peptide: 615.6 pg/mL — ABNORMAL HIGH (ref 0.0–100.0)

## 2024-04-24 LAB — OSMOLALITY: Osmolality: 285 mosm/kg (ref 275–295)

## 2024-04-24 LAB — SODIUM, URINE, RANDOM: Sodium, Ur: 33 mmol/L

## 2024-04-24 NOTE — Plan of Care (Signed)
  Problem: Clinical Measurements: Goal: Respiratory complications will improve Outcome: Progressing   Problem: Nutrition: Goal: Adequate nutrition will be maintained Outcome: Progressing   Problem: Safety: Goal: Ability to remain free from injury will improve Outcome: Progressing   

## 2024-04-24 NOTE — H&P (Incomplete)
 History and Physical      SY SCHLOTTERBECK XLK:[id number] DOB: May 02, 1934 DOA: 04/23/2024; DOS: 04/23/2024  PCP: Tura Gaines, MD  Patient coming from: home   I have personally briefly reviewed patient's old medical records in [facility]  Chief Complaint: Shortness of breath  HPI: Gregory [name] is a 89 y.o. male with medical history significant for coronary artery disease status post CABG x 2 in June 2024, chronic diastolic heart failure, CKD 3B with baseline creatinine 1.3-1.6, essential hypertension, who is admitted to [facility] on 04/23/2024 by way of transfer from [facility] with COVID-19 infection complicated by acute hypoxic respiratory failure after presenting from home to the latter facility complaining of shortness of breath.  The patient has been experiencing 2 to 3 days of new onset shortness of breath associated with cough and subjective fever.  Not associated with any chest pain, orthopnea, PND, or worsening of peripheral edema.  No recent wheezing or hemoptysis.  No recent abdominal discomfort, dysuria.  He also reports generalized weakness over similar timeframe, in the absence of any acute focal weakness.  Patient's family also feels that the patient has slightly confused relative to his baseline mental status.  He has a former smoker, but carries no known history of underlying COPD.  No known baseline supplemental oxygen requirements.  His medical history is notable for chronic diastolic heart failure, with most recent echocardiogram in May 2024, which demonstrated LVEF 55 to 60%, evidence of focal motion of maladies, grade 1 diastolic dysfunction, normal right ventricular systolic function and trivial mitral regurgitation.  Not on any diuretic medications at home.  He had presented to the office of his PCP earlier today with the above complaints, at which time COVID-19 testing was highly positive.  At that point, the patient was instructed to present to local  emergency department and subsequently presented to [facility], per these recommendations, for further evaluation management of the above.    [facility] ED Course:  Vital signs in the ED were notable for the following: Temperature max 99.0, heart rates in the 50s; slow blood pressures in the low 100s to 150s; respiratory rate 14-19, oxygen saturation initially 89% on room air, [place] improving in the range of 97 to 98% on 4 L nasal cannula.  Labs were notable for the following: VBG showed 7.371/46.  BMP was notable for the following: Sodium 132 compared to most recent prior sodium did blood of 139 in June 2024, potassium 4.5, bicarbonate 24, creatinine 1.67 compared to 1.50 on 05/28/2023, glucose 100.  Lactic acid 0.8.  CBC notable for white cell count 7700, hemoglobin 12.4 associated Neuraceq/macro properties, and relative to most recent prior hemoglobin data point of 8.9 on 03/27/2023.  COVID-19 testing was positive.  Per my interpretation, EKG in ED demonstrated the following:  ***  Imaging in the ED, per corresponding formal radiology read, was notable for the following: ***  While in the ED, the following were administered: ***  Subsequently, the patient was admitted  ***  ***red   Review of Systems: As per HPI otherwise 10 point review of systems negative.   Past Medical History:  Diagnosis Date  . Abdominal aneurysm (HCC)   . Anginal pain (HCC)   . Coronary artery disease   . Depression   . Heart disease   . History of kidney stones   . Hyperlipidemia   . Hypertension   . Myocardial infarction (HCC) 04/19/2023   NSTEMI  . Stroke [facility]) 2004?  TIA    Past Surgical History:  Procedure Laterality Date  . COLONOSCOPY    . CORONARY ANGIOGRAPHY N/A 12/29/2022   Procedure: CORONARY ANGIOGRAPHY;  Surgeon: Avanell Leigh, MD;  Location: [facility] INVASIVE CV LAB;  Service: Cardiovascular;  Laterality: N/A;  . CORONARY ARTERY BYPASS GRAFT N/A 05/22/2023   Procedure: CORONARY ARTERY  BYPASS GRAFTING (CABG) X 2 WITH LEFT INTERNAL MAMMARY ARTERY HARVEST AND GREATER SAPHENOUS VEIN HARVESTED ENDOSCOPICALLY;  Surgeon: Zelphia Higashi, MD;  Location[facility] OR;  Service: Open Heart Surgery;  Laterality: N/A;  . EYE SURGERY     cataract right  . IR ANGIO INTRA EXTRACRAN SEL COM CAROTID INNOMINATE BILAT MOD SED  02/28/2018  . IR ANGIO VERTEBRAL SEL SUBCLAVIAN INNOMINATE UNI R MOD SED  02/28/2018  . IR RADIOLOGIST EVAL & MGMT  02/12/2021  . LEFT HEART CATH AND CORONARY ANGIOGRAPHY N/A 12/26/2022   Procedure: LEFT HEART CATH AND CORONARY ANGIOGRAPHY;  Surgeon: Avanell Leigh, MD;  Location: [facility] INVASIVE CV LAB;  Service: Cardiovascular;  Laterality: N/A;  . LEFT HEART CATH AND CORONARY ANGIOGRAPHY N/A 05/16/2023   Procedure: LEFT HEART CATH AND CORONARY ANGIOGRAPHY;  Surgeon: Lucendia Rusk, MD;  Location: [facility] INVASIVE CV LAB;  Service: Cardiovascular;  Laterality: N/A;  . OTHER SURGICAL HISTORY     Carotid   . RADIOLOGY WITH ANESTHESIA N/A 02/28/2018   Procedure: STENTING;  Surgeon: Luellen Sages, MD;  Location: [facility] OR;  Service: Radiology;  Laterality: N/A;  . TEE WITHOUT CARDIOVERSION N/A 05/22/2023   Procedure: TRANSESOPHAGEAL ECHOCARDIOGRAM;  Surgeon: Zelphia Higashi, MD;  Location: [facility] OR;  Service: Open Heart Surgery;  Laterality: N/A;  . VIDEO BRONCHOSCOPY WITH ENDOBRONCHIAL NAVIGATION N/A 04/27/2023   Procedure: VIDEO BRONCHOSCOPY WITH ENDOBRONCHIAL NAVIGATION W/ BIOPSIES;  Surgeon: Zelphia Higashi, MD;  Location: [facility] OR;  Service: Thoracic;  Laterality: N/A;    Social History:  reports that he has quit smoking. His smoking use included cigarettes. He has never used smokeless tobacco. He reports that he does not drink alcohol and does not use drugs.   No Known Allergies  Family History  Problem Relation Age of Onset  . Breast cancer Mother   . Heart disease Mother   . Heart disease Brother   . Tremor Neg Hx     Family history reviewed and  not pertinent ***   Prior to Admission medications   Medication Sig Start Date End Date Taking? Authorizing Provider  ALPRAZolam  (XANAX ) 0.5 MG tablet Take 0.5 mg by mouth at bedtime. Take 0.5 mg in the morning and 1 mg at bedtime 03/17/17   [provider]  amiodarone  (PACERONE ) 200 MG tablet Take 1 tablet (200 mg total) by mouth daily. Patient not taking: Reported on 01/01/2024 08/01/23   Lamond Pilot, PA  amLODipine  (NORVASC ) 5 MG tablet Take 5 mg by mouth daily. 07/28/23   [provider]  aspirin  EC 81 MG tablet Take 81 mg by mouth daily. Swallow whole.    [provider]  atorvastatin  (LIPITOR ) 80 MG tablet Take 1 tablet (80 mg total) by mouth daily. 12/27/22   Meng, Hao, PA  clopidogrel  (PLAVIX ) 75 MG tablet Take 75 mg by mouth daily. 05/31/23   [provider]  ferrous sulfate  325 (65 FE) MG EC tablet Take 1 tablet (325 mg total) by mouth daily with breakfast. 05/28/23 12/26/23  Allegra Arch, PA-C  isosorbide  mononitrate (IMDUR ) 30 MG 24 hr tablet TAKE 1/2 OF A TABLET (15 MG TOTAL) BY  MOUTH DAILY 12/21/23   Eilleen Grates, MD  metoprolol  tartrate (LOPRESSOR ) 25 MG tablet TAKE 1 TABLET BY MOUTH TWICE A DAY 04/05/24   Eilleen Grates, MD  mirtazapine  (REMERON ) 7.5 MG tablet TAKE 1 TABLET(7.5 MG) BY MOUTH AT BEDTIME 12/11/23   Cottle, Kennedy Peabody., MD  Multiple Vitamins-Minerals (CENTRUM ADULTS PO) Take 1 tablet by mouth daily.     [provider]  Multiple Vitamins-Minerals (ZINC PO) Take by mouth.    [provider]  nitroGLYCERIN  (NITROSTAT ) 0.4 MG SL tablet Place 0.4 mg under the tongue as needed for chest pain.    [provider]  Omega-3 Fatty Acids (FISH OIL PO) Take 1 capsule by mouth daily.    [provider]  sertraline  (ZOLOFT ) 50 MG tablet TAKE 3 TABLETS(150 MG) BY MOUTH DAILY 10/18/23   Cottle, Kennedy Peabody., MD  tamsulosin  (FLOMAX ) 0.4 MG CAPS capsule Take 0.4 mg by mouth at bedtime. 10/24/23   [provider]  UNABLE TO FIND Take 1 tablet by mouth daily. Bone supplement    [provider]     Objective    Physical Exam: Vitals:   04/23/24 1900 04/23/24 1945 04/23/24 2000 04/23/24 2135  BP: (!) 120/57 (!) 111/59 128/64 (!) 152/70  Pulse: (!) 55 (!) 53 (!) 52 (!) 57  Resp: 14 16 12 18   Temp:    98.2 F (36.8 C)  TempSrc:      SpO2:  93% 97% 98%  Weight:      Height:        General: appears to be stated age; alert, oriented Skin: warm, dry, no rash Head:  AT/[place] Mouth:  Oral mucosa membranes appear moist, normal dentition Neck: supple; trachea midline Heart:  RRR; did not appreciate any M/R/G Lungs: CTAB, did not appreciate any wheezes, rales, or rhonchi Abdomen: + BS; soft, ND, NT Vascular: 2+ pedal pulses b/l; 2+ radial pulses b/l Extremities: no peripheral edema, no muscle wasting Neuro: strength and sensation intact in upper and lower extremities b/l    *** Neuro: 5/5 strength of the proximal and distal flexors and extensors of the upper and lower extremities bilaterally; sensation intact in upper and lower extremities b/l; cranial nerves II through XII grossly intact; no pronator drift; no evidence suggestive of slurred speech, dysarthria, or facial droop; Normal muscle tone. No tremors. *** Neuro: In the setting of the patient's current mental status and associated inability to follow instructions, unable to perform full neurologic exam at this time.  As such, assessment of strength, sensation, and cranial nerves is limited at this time. Patient noted to spontaneously move all 4 extremities. No tremors.  ***    Labs on Admission: I have personally reviewed following labs and imaging studies  CBC: Recent Labs  Lab 04/23/24 1612 04/23/24 1839  WBC 7.7  --   HGB 12.4* 11.9*  HCT 36.7* 35.0*  MCV 94.6  --   PLT 126*  --    Basic Metabolic Panel: Recent Labs  Lab 04/23/24 1612 04/23/24 1839  NA 132* 134*  K 4.5 4.1  CL 97*  --   CO2 24   --   GLUCOSE 100*  --   BUN 20  --   CREATININE 1.67*  --   CALCIUM  9.5  --    GFR: Estimated Creatinine Clearance: 34.2 mL/min (A) (by C-G formula based on SCr of 1.67 mg/dL (H)). Liver Function Tests: No results for input(s): "AST", "ALT", "ALKPHOS", "BILITOT", "PROT", "ALBUMIN " in the  last 168 hours. No results for input(s): "LIPASE", "AMYLASE" in the last 168 hours. No results for input(s): "AMMONIA" in the last 168 hours. Coagulation Profile: No results for input(s): "INR", "PROTIME" in the last 168 hours. Cardiac Enzymes: No results for input(s): "CKTOTAL", "CKMB", "CKMBINDEX", "TROPONINI" in the last 168 hours. BNP (last 3 results) No results for input(s): "PROBNP" in the last 8760 hours. HbA1C: No results for input(s): "HGBA1C" in the last 72 hours. CBG: No results for input(s): "GLUCAP" in the last 168 hours. Lipid Profile: No results for input(s): "CHOL", "HDL", "LDLCALC", "TRIG", "CHOLHDL", "LDLDIRECT" in the last 72 hours. Thyroid Function Tests: No results for input(s): "TSH", "T4TOTAL", "FREET4", "T3FREE", "THYROIDAB" in the last 72 hours. Anemia Panel: No results for input(s): "VITAMINB12", "FOLATE", "FERRITIN", "TIBC", "IRON", "RETICCTPCT" in the last 72 hours. Urine analysis:    Component Value Date/Time   COLORURINE YELLOW 05/22/2023 0640   APPEARANCEUR CLEAR 05/22/2023 0640   LABSPEC 1.010 05/22/2023 0640   PHURINE 6.0 05/22/2023 0640   GLUCOSEU NEGATIVE 05/22/2023 0640   HGBUR NEGATIVE 05/22/2023 0640   BILIRUBINUR NEGATIVE 05/22/2023 0640   KETONESUR NEGATIVE 05/22/2023 0640   PROTEINUR NEGATIVE 05/22/2023 0640   NITRITE NEGATIVE 05/22/2023 0640   LEUKOCYTESUR NEGATIVE 05/22/2023 0640    Radiological Exams on Admission: DG Chest Port 1 View Result Date: 04/23/2024 CLINICAL DATA:  COVID positive with increasing weakness and shortness of breath. EXAM: PORTABLE CHEST 1 VIEW COMPARISON:  June 20, 2023 FINDINGS: Multiple sternal wires and vascular clips are  noted. The heart size and mediastinal contours are within normal limits. There is moderate to marked severity calcification of the aortic arch. Mild, chronic appearing increased lung markings are seen with mild scarring and/or atelectasis present within the right upper lobe and left lung base. No pleural effusion or pneumothorax is identified. Multilevel degenerative changes are seen throughout the thoracic spine. IMPRESSION: 1. Evidence of prior median sternotomy/CABG. 2. Mild right upper lobe and left basilar scarring and/or atelectasis. Electronically Signed   By: Virgle Grime M.D.   On: 04/23/2024 17:06      Assessment/Plan    Principal Problem:   Hypoxia  ***        ***                  ***                  ***                  ***                  ***                 ***                  ***                  ***                  ***                  ***                  ***                  ***                 ***     DVT prophylaxis: SCD's ***  Code  Status: Full code*** Family Communication: none*** Disposition Plan: Per Rounding Team Consults called: none***;  Admission status: ***    I SPENT GREATER THAN 75 *** MINUTES IN CLINICAL CARE TIME/MEDICAL DECISION-MAKING IN COMPLETING THIS ADMISSION.     Gattis Kass Shelonda Saxe DO [facility] From 7PM - 7AM   04/23/2024, 10:27 PM   ***

## 2024-04-24 NOTE — Progress Notes (Addendum)
 PROGRESS NOTE    Gregory [name]  AOZ:[id number] DOB: 26-Feb-1934 DOA: 04/23/2024 PCP: Tura Gaines, MD   Brief Narrative: Gregory [name] is a 89 y.o. male with a history of CAD status post CABG x 2, chronic diastolic heart failure, CKD stage IIIb, primary pretension.  Patient presented secondary to shortness of breath and was found to have evidence of acute respite failure in addition to COVID-19 infection.  Patient started on Solu-Medrol  for management of COVID-19 infection in addition to supplemental oxygen for management of hypoxia..   Assessment and Plan:  COVID-19 infection Present on admission. No associated pneumonia. Antiviral therapy held on admission. Patient started on Solu-medrol  IV. Symptoms improving. -Continue Solu-medrol  with eventual transition to prednisone -Supportive care  Acute respiratory failure with hypoxia Secondary infection; no associated pneumonia. Oxygen weaned to room air.  Hyponatremia Unclear etiology.  Possibly related to acute illness. -Check urine sodium/osmolality - BMP in AM  Elevated AST/ALT Possibly related to acute viral illness. - CMP in AM  Acute metabolic encephalopathy Likely secondary to acute illness. Resolved.  Generalized weakness Secondary to acute infection.  Patient uses a walker at times as an outpatient.  Currently ambulating without assistance.  CKD stage IIIb Creatinine currently at baseline of 1.3-1.6.  Primary hypertension Patient is on amlodipine , metoprolol , Imdur  as an outpatient.  Antihypertensives held on admission.  Blood pressure currently well-controlled.  Heart rate slightly bradycardic. - Continue to hold outpatient antihypertensives  Hyperlipidemia - Continue Lipitor   Generalized anxiety disorder Patient is managed on Zoloft  in addition to Xanax  as an outpatient.  Xanax  held on admission secondary to encephalopathy. - Continue Zoloft   BPH Patient is on tamsulosin  as an outpatient. - Continue  tamsulosin   Chronic diastolic heart failure Currently appears to be euvolemic.  No evidence of acute heart failure at this time.  Anemia of chronic kidney disease Currently stable.  CAD -Continue Plavix  and aspirin    DVT prophylaxis: SCDs Code Status:   Code Status: Full Code Family Communication: Son at bedside Disposition Plan: Discharge home in 24 hours if remains stable, sodium improved   Consultants:  None  Procedures:  None  Antimicrobials: None    Subjective: Patient reports feeling better than on admission. He has been ambulating without difficulty.  Objective: BP (!) 158/73 (BP Location: Right Arm)   Pulse (!) 53   Temp 98 F (36.7 C) (Oral)   Resp 18   Ht 6' (1.829 m)   Wt 88.4 kg   SpO2 97%   BMI 26.43 kg/m   Examination:  General exam: Appears calm and comfortable Respiratory system: Diminished, but likely related to stethoscope. Respiratory effort normal. Cardiovascular system: S1 & S2 heard, RRR. No murmurs, rubs, gallops or clicks. Gastrointestinal system: Abdomen is nondistended, soft and nontender. Normal bowel sounds heard. Central nervous system: Alert and oriented. No focal neurological deficits. Musculoskeletal: No edema. No calf tenderness Skin: No cyanosis. No rashes Psychiatry: Judgement and insight appear normal. Mood & affect appropriate.    Data Reviewed: I have personally reviewed following labs and imaging studies  CBC Lab Results  Component Value Date   WBC 6.0 04/24/2024   RBC 3.83 (L) 04/24/2024   HGB 12.0 (L) 04/24/2024   HCT 37.6 (L) 04/24/2024   MCV 98.2 04/24/2024   MCH 31.3 04/24/2024   PLT 111 (L) 04/24/2024   MCHC 31.9 04/24/2024   RDW 13.6 04/24/2024   LYMPHSABS 0.7 04/24/2024   MONOABS 0.4 04/24/2024   EOSABS 0.0 04/24/2024   BASOSABS 0.0  04/24/2024     Last metabolic panel Lab Results  Component Value Date   NA 128 (L) 04/24/2024   K 4.1 04/24/2024   CL 97 (L) 04/24/2024   CO2 22 04/24/2024    BUN 22 04/24/2024   CREATININE 1.20 04/24/2024   GLUCOSE 193 (H) 04/24/2024   GFRNONAA 59 (L) 04/24/2024   GFRAA >60 02/27/2018   CALCIUM  8.7 (L) 04/24/2024   PHOS 3.5 04/24/2024   PROT 7.0 04/24/2024   ALBUMIN  3.3 (L) 04/24/2024   BILITOT 1.2 04/24/2024   ALKPHOS 77 04/24/2024   AST 270 (H) 04/24/2024   ALT 209 (H) 04/24/2024   ANIONGAP 9 04/24/2024    GFR: Estimated Creatinine Clearance: 47.6 mL/min (by C-G formula based on SCr of 1.2 mg/dL).  No results found for this or any previous visit (from the past 240 hours).    Radiology Studies: DG Chest Port 1 View Result Date: 04/23/2024 CLINICAL DATA:  COVID positive with increasing weakness and shortness of breath. EXAM: PORTABLE CHEST 1 VIEW COMPARISON:  June 20, 2023 FINDINGS: Multiple sternal wires and vascular clips are noted. The heart size and mediastinal contours are within normal limits. There is moderate to marked severity calcification of the aortic arch. Mild, chronic appearing increased lung markings are seen with mild scarring and/or atelectasis present within the right upper lobe and left lung base. No pleural effusion or pneumothorax is identified. Multilevel degenerative changes are seen throughout the thoracic spine. IMPRESSION: 1. Evidence of prior median sternotomy/CABG. 2. Mild right upper lobe and left basilar scarring and/or atelectasis. Electronically Signed   By: Virgle Grime M.D.   On: 04/23/2024 17:06      LOS: 1 day    Aneita Keens, MD [facility] 04/24/2024, 7:13 AM   If 7PM-7AM, please contact night-coverage [email or web address]

## 2024-04-24 NOTE — Plan of Care (Signed)
  Problem: Nutrition: Goal: Adequate nutrition will be maintained Outcome: Progressing   Problem: Safety: Goal: Ability to remain free from injury will improve Outcome: Progressing  Problem: Clinical Measurements: Goal: Respiratory complications will improve Outcome: Progressing

## 2024-04-24 NOTE — TOC Progression Note (Signed)
 Transition of Care [facility]) - Progression Note    Patient Details  Name: [name] MRN: [id number] Date of Birth: 1934-05-26  Transition of [facility]) CM/SW Contact  Tessie Fila, RN Phone Number: 04/24/2024, 3:13 PM  Clinical Narrative:    Pt oriented to self only. Called pt spouse Izac Szostek at [phone] to talk about pt recommendations for Home Health PT. Was unable to reach her and left a voicemail for return phone call. Awaiting call from pt spouse.        Expected Discharge Plan and Services                                               Social Determinants of Health (SDOH) Interventions SDOH Screenings   Food Insecurity: No Food Insecurity (04/24/2024)  Housing: Low Risk  (04/24/2024)  Transportation Needs: No Transportation Needs (04/24/2024)  Utilities: Not At Risk (04/24/2024)  Social Connections: Moderately Integrated (04/24/2024)  Tobacco Use: Medium Risk (04/23/2024)    Readmission Risk Interventions    05/28/2023    9:14 AM 05/23/2023    2:30 PM  Readmission Risk Prevention Plan  Transportation Screening  Complete  Home Care Screening Complete   Medication Review (RN CM) Complete   HRI or Home Care Consult  Complete  Social Work Consult for Recovery Care Planning/Counseling  Complete  Palliative Care Screening  Not Applicable  Medication Review [employer])  Referral to Pharmacy

## 2024-04-24 NOTE — Evaluation (Signed)
 Physical Therapy Evaluation Patient Details Name: Gregory [name] MRN: [id number] DOB: 1934-07-25 Today's Date: 04/24/2024  History of Present Illness  Gregory [name] is a 25 who is admitted to [facility] on 04/23/2024 by way of transfer from [facility] with COVID-19 infection complicated by acute hypoxic respiratory failure after presenting from home to the latter facility complaining of shortness of breath. PMH:.CAD, status post CABG x 2 in June 2024, chronic diastolic heart failure, CKD 3B with baseline creatinine 1.3-1.6, essential hypertension,  Clinical Impression  Pt admitted with above diagnosis.  Pt currently with functional limitations due to the deficits listed below (see PT Problem List). Pt will benefit from acute skilled PT to increase their independence and safety with mobility to allow discharge.     The Patient ambulated  in room with and without RW, gait  more unsteady with out . Patient has access to a RW at home, and is recommended that he use it. Patient's son present, who resides with patient. Patient's SPO2 91% with mobility on RA. 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   Recommend HHPT unless progresses to  independent level before Dc.      If plan is discharge home, recommend the following: A little help with walking and/or transfers;A little help with bathing/dressing/bathroom;Assistance with cooking/housework;Assist for transportation   Can travel by private vehicle        Equipment Recommendations None recommended by PT  Recommendations for Other Services       Functional Status Assessment Patient has had a recent decline in their functional status and demonstrates the ability to make significant improvements in function in a reasonable and predictable amount of time.     Precautions / Restrictions Precautions Precautions: Fall Precaution/Restrictions Comments: monitor  sats Restrictions Weight Bearing  Restrictions Per Provider Order: No      Mobility  Bed Mobility Overal bed mobility: Needs Assistance Bed Mobility: Supine to Sit     Supine to sit: Supervision          Transfers Overall transfer level: Needs assistance Equipment used: Rolling walker (2 wheels) Transfers: Sit to/from Stand Sit to Stand: Contact guard assist           General transfer comment: noted   shakey  legs upon standing    Ambulation/Gait Ambulation/Gait assistance: Contact guard assist, Min assist Gait Distance (Feet): 30 Feet (30' no RW) Assistive device: Rolling walker (2 wheels) Gait Pattern/deviations: Step-through pattern, Staggering right, Staggering left Gait velocity: decr     General Gait Details: noted more staggers with turns without Rw.  Stairs            Wheelchair Mobility     Tilt Bed    Modified Rankin (Stroke Patients Only)       Balance Overall balance assessment: Mild deficits observed, not formally tested                                           Pertinent Vitals/Pain Pain Assessment Pain Assessment: No/denies pain    Home Living Family/patient expects to be discharged to:: Private residence Living Arrangements: Spouse/significant other;Children Available Help at Discharge: Family Type of Home: House Home Access: Stairs to enter Entrance Stairs-Rails: Right Entrance Stairs-Number of Steps: 5   Home Layout: Multi-level;Bed/bath upstairs Home Equipment: Agricultural consultant (2 wheels)      Prior  Function Prior Level of Function : Independent/Modified Independent;Driving             Mobility Comments: owns a farm ADLs Comments: Ind with ADL,     Extremity/Trunk Assessment        Lower Extremity Assessment Lower Extremity Assessment: Generalized weakness (legs are shakey when standing/ trmors)    Cervical / Trunk Assessment Cervical / Trunk Assessment: Normal  Communication   Communication Communication:  Impaired Factors Affecting Communication: Hearing impaired    Cognition Arousal: Alert Behavior During Therapy: WFL for tasks assessed/performed   PT - Cognitive impairments: No apparent impairments                       PT - Cognition Comments: slightly off on date Following commands: Intact       Cueing       General Comments      Exercises     Assessment/Plan    PT Assessment Patient needs continued PT services  PT Problem List Decreased strength;Decreased mobility;Decreased balance       PT Treatment Interventions DME instruction;Therapeutic activities;Gait training;Functional mobility training;Therapeutic exercise;Patient/family education    PT Goals (Current goals can be found in the Care Plan section)  Acute Rehab PT Goals Patient Stated Goal: to go home PT Goal Formulation: With patient/family Time For Goal Achievement: 05/08/24 Potential to Achieve Goals: Good    Frequency Min 3X/week     Co-evaluation PT/OT/SLP Co-Evaluation/Treatment: Yes Reason for Co-Treatment: For patient/therapist safety PT goals addressed during session: Mobility/safety with mobility OT goals addressed during session: ADL's and self-care       AM-PAC PT "6 Clicks" Mobility  Outcome Measure Help needed turning from your back to your side while in a flat bed without using bedrails?: None Help needed moving from lying on your back to sitting on the side of a flat bed without using bedrails?: None Help needed moving to and from a bed to a chair (including a wheelchair)?: A Little Help needed standing up from a chair using your arms (e.g., wheelchair or bedside chair)?: A Little Help needed to walk in hospital room?: A Little Help needed climbing 3-5 steps with a railing? : A Lot 6 Click Score: 19    End of Session Equipment Utilized During Treatment: Gait belt Activity Tolerance: Patient tolerated treatment well Patient left: in chair;with call bell/phone within  reach;with family/visitor present Nurse Communication: Mobility status PT Visit Diagnosis: Unsteadiness on feet (R26.81)    Time: 1610-9604 PT Time Calculation (min) (ACUTE ONLY): 17 min   Charges:   PT Evaluation $PT Eval Low Complexity: 1 Low   PT General Charges $$ ACUTE PT VISIT: 1 Visit         Abelina Hoes PT Acute Rehabilitation Services Office [phone]    Dareen Ebbing 04/24/2024, 1:52 PM

## 2024-04-24 NOTE — Evaluation (Signed)
 Occupational Therapy Evaluation Patient Details Name: Gregory [name] MRN: [id number] DOB: 09/08/1934 Today's Date: 04/24/2024   History of Present Illness   Gregory [name] is a 81 who is admitted to [facility] on 04/23/2024 by way of transfer from [facility] with COVID-19 infection complicated by acute hypoxic respiratory failure after presenting from home to the latter facility complaining of shortness of breath. PMH:.CAD, status post CABG x 2 in June 2024, chronic diastolic heart failure, CKD 3B with baseline creatinine 1.3-1.6, essential hypertension,     Clinical Impressions The pt is currently presenting slightly below his baseline level of functioning for self-care management, as he is limited by the below listed deficits (see OT problem list). During the session, he required SBA to CGA for tasks, including lower body dressing, sit to stand, and ambulating in his room with and without an assistive device. He was noted to be with intermittent shakiness and unsteadiness in standing. He will benefit from further OT services to maximize his independence with self-care tasks & to facilitate his safe return home. Anticipate no post-acute therapy needs will be needed.      If plan is discharge home, recommend the following:   Assist for transportation;Help with stairs or ramp for entrance     Functional Status Assessment   Patient has had a recent decline in their functional status and demonstrates the ability to make significant improvements in function in a reasonable and predictable amount of time.     Equipment Recommendations   None recommended by OT     Recommendations for Other Services         Precautions/Restrictions   Restrictions Weight Bearing Restrictions Per Provider Order: No Other Position/Activity Restrictions: airborne     Mobility Bed Mobility Overal bed mobility: Needs Assistance Bed Mobility: Supine to Sit     Supine to sit:  Supervision          Transfers Overall transfer level: Needs assistance Equipment used: Rolling walker (2 wheels) Transfers: Sit to/from Stand Sit to Stand: Contact guard assist           General transfer comment: noted   shakey  legs upon standing      Balance Overall balance assessment: Mild deficits observed, not formally tested                ADL either performed or assessed with clinical judgement   ADL Overall ADL's : Needs assistance/impaired Eating/Feeding: Independent;Sitting   Grooming: Set up;Sitting;Contact guard assist;Standing Grooming Details (indicate cue type and reason): set-up sitting or CGA standing         Upper Body Dressing : Set up;Sitting   Lower Body Dressing: Set up Lower Body Dressing Details (indicate cue type and reason): He doffed then donned his sock seated EOB. Toilet Transfer: Contact guard assist;Ambulation;Rolling walker (2 wheels);Standard walker   Toileting- Clothing Manipulation and Hygiene: Contact guard assist;Sit to/from stand Toileting - Clothing Manipulation Details (indicate cue type and reason): at bathroom level, based on clinical judgement             Vision   Additional Comments: He correctly read the time depicted on the wall clock.            Pertinent Vitals/Pain Pain Assessment Pain Assessment: No/denies pain     Extremity/Trunk Assessment Upper Extremity Assessment Upper Extremity Assessment: Overall WFL for tasks assessed;Right hand dominant   Lower Extremity Assessment Lower Extremity Assessment: Overall WFL for tasks assessed      Communication  Communication Communication: Impaired Factors Affecting Communication: Hearing impaired   Cognition Arousal: Alert Behavior During Therapy: WFL for tasks assessed/performed               OT - Cognition Comments: Oriented x4                 Following commands: Intact                  Home Living Family/patient expects  to be discharged to:: Private residence Living Arrangements: Spouse/significant other;Children (spouse and son) Available Help at Discharge: Family Type of Home: House Home Access: Stairs to enter Secretary/administrator of Steps: 5 Entrance Stairs-Rails: Right Home Layout: Multi-level Alternate Level Stairs-Number of Steps: 3 story home. His bedroom and full bathroom are on the main level of the home.   Bathroom Shower/Tub: Producer, television/film/video: Handicapped height     Home Equipment: Agricultural consultant (2 wheels);Shower seat          Prior Functioning/Environment Prior Level of Function : Independent/Modified Independent;Driving             Mobility Comments:  (Independent) ADLs Comments:  (Independent with ADLs. 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.)    OT Problem List: Decreased strength;Impaired balance (sitting and/or standing);Decreased knowledge of use of DME or AE   OT Treatment/Interventions: Self-care/ADL training;Therapeutic exercise;Therapeutic activities;Energy conservation;Patient/family education;DME and/or AE instruction;Balance training      OT Goals(Current goals can be found in the care plan section)   Acute Rehab OT Goals OT Goal Formulation: With patient Time For Goal Achievement: 05/08/24 Potential to Achieve Goals: Good ADL Goals Pt Will Perform Grooming: with modified independence;standing Pt Will Perform Lower Body Dressing: with modified independence;sit to/from stand;sitting/lateral leans Pt Will Transfer to Toilet: with modified independence;ambulating Pt Will Perform Toileting - Clothing Manipulation and hygiene: with modified independence;sit to/from stand   OT Frequency:  Min 1X/week    Co-evaluation PT/OT/SLP Co-Evaluation/Treatment: Yes Reason for Co-Treatment: For patient/therapist safety PT goals addressed during session: Mobility/safety with mobility OT goals addressed during session: ADL's and  self-care      AM-PAC OT "6 Clicks" Daily Activity     Outcome Measure Help from another person eating meals?: None Help from another person taking care of personal grooming?: A Little Help from another person toileting, which includes using toliet, bedpan, or urinal?: A Little Help from another person bathing (including washing, rinsing, drying)?: A Little Help from another person to put on and taking off regular upper body clothing?: None Help from another person to put on and taking off regular lower body clothing?: A Little 6 Click Score: 20   End of Session Equipment Utilized During Treatment: Rolling walker (2 wheels) Nurse Communication: Mobility status (and pt's O2 saturation on room air with activity)  Activity Tolerance: Patient tolerated treatment well Patient left: in chair;with call bell/phone within reach;with family/visitor present;with chair alarm set  OT Visit Diagnosis: Unsteadiness on feet (R26.81);Muscle weakness (generalized) (M62.81)                Time: 4098-1191 OT Time Calculation (min): 15 min Charges:  OT General Charges $OT Visit: 1 Visit OT Evaluation $OT Eval Moderate Complexity: 1 Mod    Lupe Bonner L Tamberly Pomplun, OTR/L 04/24/2024, 2:05 PM

## 2024-04-24 NOTE — Progress Notes (Signed)
 Patient 95% on 3.5L [place]. IS to 2200. Patient placed on RA and monitored for approx 5 minutes, staying between 92-95% RA.

## 2024-04-24 NOTE — Hospital Course (Signed)
 Gregory [name] is a 89 y.o. male with a history of CAD status post CABG x 2, chronic diastolic heart failure, CKD stage IIIb, primary pretension.  Patient presented secondary to shortness of breath and was found to have evidence of acute respite failure in addition to COVID-19 infection.  Patient started on Solu-Medrol  for management of COVID-19 infection in addition to supplemental oxygen for management of hypoxia.Aaron Aas

## 2024-04-25 DIAGNOSIS — U071 COVID-19: Secondary | ICD-10-CM | POA: Diagnosis not present

## 2024-04-25 LAB — COMPREHENSIVE METABOLIC PANEL WITH GFR
ALT: 186 U/L — ABNORMAL HIGH (ref 0–44)
AST: 197 U/L — ABNORMAL HIGH (ref 15–41)
Albumin: 3.2 g/dL — ABNORMAL LOW (ref 3.5–5.0)
Alkaline Phosphatase: 74 U/L (ref 38–126)
Anion gap: 11 (ref 5–15)
BUN: 25 mg/dL — ABNORMAL HIGH (ref 8–23)
CO2: 21 mmol/L — ABNORMAL LOW (ref 22–32)
Calcium: 8.9 mg/dL (ref 8.9–10.3)
Chloride: 100 mmol/L (ref 98–111)
Creatinine, Ser: 1.11 mg/dL (ref 0.61–1.24)
GFR, Estimated: >60 mL/min (ref >60)
Glucose, Bld: 198 mg/dL — ABNORMAL HIGH (ref 70–99)
Potassium: 4.2 mmol/L (ref 3.5–5.1)
Sodium: 132 mmol/L — ABNORMAL LOW (ref 135–145)
Total Bilirubin: 0.9 mg/dL (ref 0.0–1.2)
Total Protein: 6.6 g/dL (ref 6.5–8.1)

## 2024-04-25 MED ORDER — PREDNISONE 50 MG PO TABS: 50.0000 mg | ORAL_TABLET | Freq: Every day | ORAL | 0 refills | Status: AC

## 2024-04-25 NOTE — Discharge Summary (Signed)
 Physician Discharge Summary   Patient: Gregory [name] MRN: [id number] DOB: 04/04/1934  Admit date:     04/23/2024  Discharge date: 04/25/24  Discharge Physician: Aneita Keens, MD   PCP: Tura Gaines, MD   Recommendations at discharge:  PCP visit for hospital follow-up Hold amlodipine  for low-normal blood pressure Repeat CMP in 5-7 days  Discharge Diagnoses: Principal Problem:   COVID-19 virus infection Active Problems:   Essential hypertension   Hyperlipidemia   Acute hypoxic respiratory failure (HCC)   Acute metabolic encephalopathy   Acute hyponatremia   Generalized weakness   CKD stage 3b, GFR 30-44 ml/min (HCC)   GAD (generalized anxiety disorder)   BPH (benign prostatic hyperplasia)   Chronic diastolic CHF (congestive heart failure) (HCC)   History of anemia due to chronic kidney disease  Resolved Problems:   * No resolved hospital problems. *  Hospital Course: HALTON SOLIMAN is a 89 y.o. male with a history of CAD status post CABG x 2, chronic diastolic heart failure, CKD stage IIIb, primary pretension.  Patient presented secondary to shortness of breath and was found to have evidence of acute respite failure in addition to COVID-19 infection.  Patient started on Solu-Medrol  for management of COVID-19 infection in addition to supplemental oxygen for management of hypoxia. Patient improved with steroids.  Assessment and Plan:  COVID-19 infection Present on admission. No associated pneumonia. Antiviral therapy held on admission. Patient started on Solu-medrol  IV. Symptoms improved significantly. Discharge to complete a 5 day course of antibiotics with prednisone.   Acute respiratory failure with hypoxia Secondary infection; no associated pneumonia. Oxygen weaned to room air. Ambulated without need for oxygen.   Hyponatremia Unclear etiology.  Possibly related to acute illness. Urine studies suggests possible SIADH. Improved prior to discharge.   Elevated  AST/ALT Possibly related to acute viral illness. Trended down prior to discharge. Repeat CMP in 5-7 days.   Acute metabolic encephalopathy Likely secondary to acute illness. Resolved.   Generalized weakness Secondary to acute infection.  Patient uses a walker at times as an outpatient.  Currently ambulating without assistance.   CKD stage IIIb Creatinine currently at baseline of 1.3-1.6.   Primary hypertension Patient is on amlodipine , metoprolol , Imdur  as an outpatient.  Antihypertensives held on admission.  Blood pressure currently well-controlled.  Heart rate slightly bradycardic. Blood pressure increased prior to discharge; resume home metoprolol  and Imdur . Hold amlodipine .   Hyperlipidemia Continue Lipitor    Generalized anxiety disorder Patient is managed on Zoloft  in addition to Xanax  as an outpatient.  Xanax  held on admission secondary to encephalopathy. Continue Zoloft .   BPH Patient is on tamsulosin  as an outpatient. Continue tamsulosin .   Chronic diastolic heart failure Currently appears to be euvolemic.  No evidence of acute heart failure at this time.   Anemia of chronic kidney disease Currently stable.   CAD Continue Plavix  and aspirin .   Consultants: None Procedures performed: None  Disposition: Home health Diet recommendation: Cardiac diet   DISCHARGE MEDICATION: Allergies as of 04/25/2024   No Known Allergies      Medication List     PAUSE taking these medications    amLODipine  5 MG tablet Wait to take this until your doctor or other care provider tells you to start again. Commonly known as: NORVASC  Take 5 mg by mouth daily.       TAKE these medications    ALPRAZolam  0.5 MG tablet Commonly known as: XANAX  Take 0.5-1 mg by mouth in the morning and at bedtime.  Take 0.5 mg in the morning and 1 mg at bedtime   amiodarone  200 MG tablet Commonly known as: PACERONE  Take 1 tablet (200 mg total) by mouth daily. What changed: when to take this    aspirin  EC 81 MG tablet Take 81 mg by mouth daily. Swallow whole.   atorvastatin  80 MG tablet Commonly known as: LIPITOR  Take 1 tablet (80 mg total) by mouth daily.   CENTRUM ADULTS PO Take 1 tablet by mouth daily.   clopidogrel  75 MG tablet Commonly known as: PLAVIX  Take 75 mg by mouth daily.   ferrous sulfate  325 (65 FE) MG EC tablet Take 1 tablet (325 mg total) by mouth daily with breakfast.   FISH OIL PO Take 1 capsule by mouth daily.   isosorbide  mononitrate 30 MG 24 hr tablet Commonly known as: IMDUR  TAKE 1/2 OF A TABLET (15 MG TOTAL) BY MOUTH DAILY What changed: See the new instructions.   metoprolol  tartrate 25 MG tablet Commonly known as: LOPRESSOR  TAKE 1 TABLET BY MOUTH TWICE A DAY   mirtazapine  7.5 MG tablet Commonly known as: REMERON  TAKE 1 TABLET(7.5 MG) BY MOUTH AT BEDTIME What changed: See the new instructions.   nitroGLYCERIN  0.4 MG SL tablet Commonly known as: NITROSTAT  Place 0.4 mg under the tongue as needed for chest pain.   predniSONE 50 MG tablet Commonly known as: DELTASONE Take 1 tablet (50 mg total) by mouth daily with breakfast for 3 days. Start taking on: Apr 26, 2024   sertraline  50 MG tablet Commonly known as: ZOLOFT  TAKE 3 TABLETS(150 MG) BY MOUTH DAILY   tamsulosin  0.4 MG Caps capsule Commonly known as: FLOMAX  Take 0.4 mg by mouth at bedtime.   ZINC PO Take 1 capsule by mouth daily at 12 noon.        Follow-up Information     Tura Gaines, MD. Schedule an appointment as soon as possible for a visit in 1 week(s).   Specialty: Family Medicine Why: For hospital follow-up Contact information: [address] [place] [address] [phone]                Discharge Exam: BP 127/70 (BP Location: Left Arm)   Pulse (!) 59   Temp 98.5 F (36.9 C) (Oral)   Resp 18   Ht 6' (1.829 m)   Wt 89.6 kg   SpO2 94%   BMI 26.79 kg/m   General exam: Appears calm and comfortable Respiratory system: Clear to  auscultation. Respiratory effort normal. Cardiovascular system: S1 & S2 heard, RRR. No murmurs, rubs, gallops or clicks. Gastrointestinal system: Abdomen is nondistended, soft and nontender. Normal bowel sounds heard. Central nervous system: Alert and oriented. No focal neurological deficits.  Condition at discharge: stable  The results of significant diagnostics from this hospitalization (including imaging, microbiology, ancillary and laboratory) are listed below for reference.   Imaging Studies: DG Chest Port 1 View Result Date: 04/23/2024 CLINICAL DATA:  COVID positive with increasing weakness and shortness of breath. EXAM: PORTABLE CHEST 1 VIEW COMPARISON:  June 20, 2023 FINDINGS: Multiple sternal wires and vascular clips are noted. The heart size and mediastinal contours are within normal limits. There is moderate to marked severity calcification of the aortic arch. Mild, chronic appearing increased lung markings are seen with mild scarring and/or atelectasis present within the right upper lobe and left lung base. No pleural effusion or pneumothorax is identified. Multilevel degenerative changes are seen throughout the thoracic spine. IMPRESSION: 1. Evidence of prior median sternotomy/CABG.  2. Mild right upper lobe and left basilar scarring and/or atelectasis. Electronically Signed   By: Virgle Grime M.D.   On: 04/23/2024 17:06    Microbiology: Results for orders placed or performed during the hospital encounter of 05/22/23  SARS Coronavirus 2 by RT PCR (hospital order, performed in [facility] hospital lab) *cepheid single result test* Anterior Nasal Swab     Status: None   Collection Time: 05/22/23  5:45 AM   Specimen: Anterior Nasal Swab  Result Value Ref Range Status   SARS Coronavirus 2 by RT PCR NEGATIVE NEGATIVE Final    Comment: Performed at [facility], [address], [place], [place] [address]  Surgical pcr screen     Status: None   Collection Time: 05/22/23  6:34 AM    Specimen: Nasal Mucosa; Nasal Swab  Result Value Ref Range Status   MRSA, PCR NEGATIVE NEGATIVE Final   Staphylococcus aureus NEGATIVE NEGATIVE Final    Comment: (NOTE) The Xpert SA Assay (FDA approved for NASAL specimens in patients 18 years of age and older), is one component of a comprehensive surveillance program. It is not intended to diagnose infection nor to guide or monitor treatment. Performed at [facility], [address], [place], [place] [address]   Culture, Fungus without Smear     Status: None   Collection Time: 05/22/23 11:40 AM   Specimen: Lung, Right Upper Lobe  Result Value Ref Range Status   Specimen Description LUNG  Final   Special Requests NONE  Final   Culture   Final    NO GROWTH 21 DAYS Performed at [facility], [address], [place], [place] [address]    Report Status 06/13/2023 FINAL  Final  Acid Fast Culture with reflexed sensitivities     Status: None   Collection Time: 05/22/23 11:40 AM   Specimen: Lung, Right Upper Lobe  Result Value Ref Range Status   Acid Fast Culture Negative  Final    Comment: (NOTE) No acid fast bacilli isolated after 6 weeks. Performed At: [facility] [address] [place], [place] [id number] Pearlean Botts MD [id number]    Source of Sample LUNG  Final    Comment: Performed at [facility], [address], [place], [place] [address]  Acid Fast Smear (AFB)     Status: None   Collection Time: 05/22/23 11:40 AM   Specimen: Lung, Right Upper Lobe  Result Value Ref Range Status   AFB Specimen Processing Concentration  Final   Acid Fast Smear Negative  Final    Comment: (NOTE) Performed At: [facility] [address] [place], [place] [id number] Pearlean Botts MD [id number]    Source (AFB) LUNG  Final    Comment: Performed at [facility], [address] [place], [place] [address]    Labs: CBC: Recent Labs  Lab 04/23/24 1612 04/23/24 1839 04/24/24 0608   WBC 7.7  --  6.0  NEUTROABS  --   --  4.8  HGB 12.4* 11.9* 12.0*  HCT 36.7* 35.0* 37.6*  MCV 94.6  --  98.2  PLT 126*  --  111*   Basic Metabolic Panel: Recent Labs  Lab 04/23/24 1612 04/23/24 1839 04/24/24 0608 04/25/24 0537  NA 132* 134* 128* 132*  K 4.5 4.1 4.1 4.2  CL 97*  --  97* 100  CO2 24  --  22 21*  GLUCOSE 100*  --  193* 198*  BUN 20  --  22 25*  CREATININE 1.67*  --  1.20 1.11  CALCIUM  9.5  --  8.7* 8.9  MG  --   --  2.0  --   PHOS  --   --  3.5  --    Liver Function Tests: Recent Labs  Lab 04/24/24 0608 04/25/24 0537  AST 270* 197*  ALT 209* 186*  ALKPHOS 77 74  BILITOT 1.2 0.9  PROT 7.0 6.6  ALBUMIN  3.3* 3.2*     Discharge time spent: 35 minutes.  Signed: Aneita Keens, MD [facility] 04/25/2024

## 2024-04-25 NOTE — Discharge Instructions (Signed)
 Gregory [name],  You were in the hospital with COVID-19. You have improved significantly with steroids. Please continue your steroids on discharge, however it does not seem likely that you will need a prolonged course. Please refrain from using the medication that was previously prescribed to you for treatment of your COVID-19 infection prior to your admission. Please follow-up with your PCP.

## 2024-04-25 NOTE — Progress Notes (Signed)
   04/25/24 1349  TOC Brief Assessment  Insurance and Status Reviewed  Patient has primary care physician Yes  Home environment has been reviewed Single family home with spouse  Prior level of function: Independent  Prior/Current Home Services No current home services  Readmission risk has been reviewed Yes (18%)  Transition of care needs no transition of care needs at this time   Met with pt to discuss the recommendation for HHPT. Pt states that he has his own personal guy that provides those services for him and he would like to set it up on his own. Spoke with pt son about services and son unwilling to discuss current services pt receiving at home. Pt declines for CM to set up [facility] PT services for him at this time. No other TOC needs at this time.

## 2024-04-25 NOTE — Progress Notes (Signed)
 Physical Therapy Treatment Patient Details Name: Gregory [name] MRN: [id number] DOB: [birth year] Today's Date: 04/25/2024   History of Present Illness Gregory [name] is a 16 who is admitted to [facility] on 04/23/2024 by way of transfer from [facility] with COVID-19 infection complicated by acute hypoxic respiratory failure after presenting from home to the latter facility complaining of shortness of breath. PMH:.CAD, status post CABG x 2 in June 2024, chronic diastolic heart failure, CKD 3B with baseline creatinine 1.3-1.6, essential hypertension,    PT Comments  The patient reports ambulating  ion the room. Patient is noted to be quite tremulous in Hands, difficulty getting a mask on. Patient ambulated x 400' using RW. Encouraged patient to use  RW when he returns home. Rec. HHPT.   If plan is discharge home, recommend the following: A little help with walking and/or transfers;A little help with bathing/dressing/bathroom;Assistance with cooking/housework;Assist for transportation   Can travel by private vehicle        Equipment Recommendations  None recommended by PT    Recommendations for Other Services       Precautions / Restrictions Precautions Precautions: Fall     Mobility  Bed Mobility               General bed mobility comments: in recliner    Transfers Overall transfer level: Needs assistance Equipment used: None Transfers: Sit to/from Stand Sit to Stand: Supervision           General transfer comment: noted   shakey  legs upon standing, no RW to stand    Ambulation/Gait Ambulation/Gait assistance: Contact guard assist Gait Distance (Feet): 400 Feet Assistive device: Rolling walker (2 wheels) Gait Pattern/deviations: Step-through pattern       General Gait Details: noted trembly in legs with ambulation   Stairs             Wheelchair Mobility     Tilt Bed    Modified Rankin (Stroke Patients Only)       Balance Overall  balance assessment: Mild deficits observed, not formally tested                                          Communication Communication Factors Affecting Communication: Hearing impaired  Cognition Arousal: Alert Behavior During Therapy: WFL for tasks assessed/performed   PT - Cognitive impairments: Awareness, Safety/Judgement                       PT - Cognition Comments: reports up ad lib in room Following commands: Intact      Cueing    Exercises      General Comments        Pertinent Vitals/Pain Pain Assessment Pain Assessment: No/denies pain    Home Living                          Prior Function            PT Goals (current goals can now be found in the care plan section) Progress towards PT goals: Progressing toward goals    Frequency    Min 3X/week      PT Plan      Co-evaluation              AM-PAC PT "6 Clicks" Mobility   Outcome Measure  Help  needed turning from your back to your side while in a flat bed without using bedrails?: None Help needed moving from lying on your back to sitting on the side of a flat bed without using bedrails?: None Help needed moving to and from a bed to a chair (including a wheelchair)?: None Help needed standing up from a chair using your arms (e.g., wheelchair or bedside chair)?: A Little Help needed to walk in hospital room?: A Little Help needed climbing 3-5 steps with a railing? : A Little 6 Click Score: 21    End of Session   Activity Tolerance: Patient tolerated treatment well Patient left: in chair;with call bell/phone within reach Nurse Communication: Mobility status PT Visit Diagnosis: Unsteadiness on feet (R26.81)     Time: 1115-1130 PT Time Calculation (min) (ACUTE ONLY): 15 min  Charges:    $Gait Training: 8-22 mins PT General Charges $$ ACUTE PT VISIT: 1 Visit                     Abelina Hoes PT Acute Rehabilitation Services Office  [phone]     Dareen Ebbing 04/25/2024, 12:55 PM

## 2024-05-20 ENCOUNTER — Encounter (HOSPITAL_COMMUNITY): Payer: Self-pay | Admitting: Gastroenterology

## 2024-05-20 ENCOUNTER — Telehealth: Payer: Self-pay

## 2024-05-20 NOTE — Telephone Encounter (Signed)
   Name: ALCIDES NUTTING  DOB: Apr 25, 1934  MRN: [id number]  Primary Cardiologist: Eilleen Grates, MD  Chart reviewed as part of pre-operative protocol coverage. The patient has an upcoming visit scheduled with Dr. Lavonne Prairie on 05/22/24 at which time clearance can be addressed in case there are any issues that would impact surgical recommendations.  Endoscopy is not scheduled until 05/27/24 as below. I added preop FYI to appointment note so that provider is aware to address at time of outpatient visit.  Per office protocol the cardiology provider should forward their finalized clearance decision and recommendations regarding antiplatelet therapy to the requesting party below.    I will route this message as FYI to requesting party and remove this message from the preop box as separate preop APP input not needed at this time.   Please call with any questions.  Abdoulie Tierce D Katlin Ciszewski, NP  05/20/2024, 4:50 PM

## 2024-05-20 NOTE — Progress Notes (Unsigned)
 Cardiology Office Note:   Date:  05/22/2024  ID:  Gregory [name], DOB [birth year], MRN [id number] PCP: Tura Gaines, MD  [facility] Providers Cardiologist:  Eilleen Grates, MD Cardiology APP:  Lamond Pilot, PA {  History of Present Illness:   Gregory [name] is a 89 y.o. 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     He was admitted in January 2024 with chest pain concerning for angina.  At that time heart catheterization on 12/26/2022 showed nonobstructive disease and diffuse calcified vessels.  CTA negative for PE but did show a AAA.  He was readmitted 12/29/2022 for similar presentation and repeat heart catheterization in the setting of NSTEMI revealed 75-80% lesion of the septal perforator just before an aneurysmal segment in the proximal LAD.  This was not ideal for PCI and continued medical therapy was recommended.  He was placed on 5 mg amlodipine  and low-dose Imdur .  He subsequently had a diagnosis of lung cancer.  He was being evaluated for lobectomy but had unstable angina .  He eventually had CABG with LIMA to the LAD and SVG to OM2.  He had radiation therapy.  In May 2025 he had COVID and was admitted for this.  I reviewed these records for this visit.  He only required a couple of days of hospitalization.  He had some low sodium.  Interestingly he had elevated liver enzymes that did trend down a little bit more thought to be viral.  His creatinine was mildly elevated he is now here for follow-up.  Amazingly he brought himself to the office.  He denies any new cardiovascular symptoms.  He has a little bit of sharp discomfort on the right side of his chest if he lifts something.  He is not having any resting shortness of breath, PND or orthopnea.  He is not having any new palpitations, presyncope or  syncope.  He had no weight gain or edema.  ROS: As stated in the HPI and negative for all other systems.   Studies Reviewed:    EKG:   NA  Risk Assessment/Calculations:              Physical Exam:   VS:  BP 120/68   Pulse (!) 47   Ht 6' (1.829 m)   Wt 195 lb (88.5 kg)   BMI 26.45 kg/m    Wt Readings from Last 3 Encounters:  05/22/24 195 lb (88.5 kg)  04/25/24 197 lb 8.5 oz (89.6 kg)  12/26/23 191 lb 6 oz (86.8 kg)     GEN: Well nourished, well developed in no acute distress NECK: No JVD; No carotid bruits CARDIAC: RRR, 2 out of 6 brief apical systolic murmur radiating slightly at the aortic outflow tract.  Murmurs, rubs, gallops RESPIRATORY:  Clear to auscultation without rales, wheezing or rhonchi  ABDOMEN: Soft, non-tender, non-distended EXTREMITIES:  No edema; No deformity   ASSESSMENT AND PLAN:   Acute respiratory failure with hypoxia: He is actually recovered from this.  He will continue with pulmonary toilet.  Hyponatremia: Sodium had improved to 132 on discharge.  I will follow-up basic metabolic profile.    Elevated AST/ALT: I will check liver enzymes and if these are still elevated I will probably discontinue Lipitor  and start Repatha.  CKD stage IIIb: I will follow-up with creatinine  as above.   Primary hypertension: The blood pressure is currently well treated.  No change in therapy.  Amlodipine  was held at discharge.  I agree with continuing to hold this.  Hyperlipidemia: As above.  Goal LDL is in the 50s.  Chronic diastolic heart failure: He seems to be euvolemic.  No change in therapy.e.   Anemia of chronic kidney disease: Hemoglobin was stable at discharge.   CAD: Status post bypass.  Continue aspirin  Plavix  for now.  Squamous cell cancer of the RUL: He follows with oncology and I did review recent CT.  AS: He had some mild aortic stenosis and I will follow this clinically.  Preop: The patient is due to have endoscopy.  There are no absolute  contraindications from a cardiovascular standpoint.  No preoperative cardiovascular testing is needed.  His risk lies in his advanced age and multiple comorbid illnesses.       Follow up with me in about 3 months  Signed, Eilleen Grates, MD

## 2024-05-20 NOTE — Telephone Encounter (Signed)
 [facility] Pre-operative Risk Assessment     Request for surgical clearance:     Endoscopy Procedure  What type of surgery is being performed?     endoscopy  When is this surgery scheduled?     05/27/2024  What type of clearance is required ?   Medical Cardiac Clearance - patient has an appointment with Dr. Danley Dusky on 05/22/24  Are there any medications that need to be held prior to surgery and how long?   Practice name and name of physician performing surgery?      [facility]  What is your office phone and fax number?      Phone- [phone]  Fax- [phone]  Anesthesia type (None, local, MAC, general) ?       MAC   Please route your response to [facility]

## 2024-05-20 NOTE — Telephone Encounter (Signed)
 Procedure:endo Procedure date: 05/27/24 Procedure location: wl Arrival Time: 27 Spoke with the patient Y/N: y Any prep concerns? n  Has the patient obtained the prep from the pharmacy ? n Do you have a care partner and transportation: y Any additional concerns? n

## 2024-05-22 ENCOUNTER — Ambulatory Visit (INDEPENDENT_AMBULATORY_CARE_PROVIDER_SITE_OTHER): Admitting: Cardiology

## 2024-05-22 ENCOUNTER — Encounter: Payer: Self-pay | Admitting: Cardiology

## 2024-05-22 VITALS — BP 120/68 | HR 47 | Ht 72.0 in | Wt 195.0 lb

## 2024-05-22 DIAGNOSIS — E785 Hyperlipidemia, unspecified: Secondary | ICD-10-CM

## 2024-05-22 DIAGNOSIS — I10 Essential (primary) hypertension: Secondary | ICD-10-CM | POA: Diagnosis not present

## 2024-05-22 DIAGNOSIS — Z131 Encounter for screening for diabetes mellitus: Secondary | ICD-10-CM

## 2024-05-22 DIAGNOSIS — I2510 Atherosclerotic heart disease of native coronary artery without angina pectoris: Secondary | ICD-10-CM

## 2024-05-22 DIAGNOSIS — N1832 Chronic kidney disease, stage 3b: Secondary | ICD-10-CM | POA: Diagnosis not present

## 2024-05-22 NOTE — Patient Instructions (Signed)
 Medication Instructions:  Your physician recommends that you continue on your current medications as directed. Please refer to the Current Medication list given to you today.  *If you need a refill on your cardiac medications before your next appointment, please call your pharmacy*  Lab Work: Your physician recommends that you return for lab work in: Fasting ( A1C, Lipid, CMET)   If you have labs (blood work) drawn today and your tests are completely normal, you will receive your results only by: MyChart Message (if you have MyChart) OR A paper copy in the mail If you have any lab test that is abnormal or we need to change your treatment, we will call you to review the results.  Testing/Procedures: NONE   Follow-Up: At [facility], you and your health needs are our priority.  As part of our continuing mission to provide you with exceptional heart care, our providers are all part of one team.  This team includes your primary Cardiologist (physician) and Advanced Practice Providers or APPs (Physician Assistants and Nurse Practitioners) who all work together to provide you with the care you need, when you need it.  Your next appointment:   3 month(s)  Provider:   Eilleen Grates, MD    We recommend signing up for the patient portal called "MyChart".  Sign up information is provided on this After Visit Summary.  MyChart is used to connect with patients for Virtual Visits (Telemedicine).  Patients are able to view lab/test results, encounter notes, upcoming appointments, etc.  Non-urgent messages can be sent to your provider as well.   To learn more about what you can do with MyChart, go to [email or web address].   Other Instructions Thank you for choosing [facility]!

## 2024-05-23 NOTE — Telephone Encounter (Signed)
 FYI, I looked through the cardiology note from yesterday but couldn't find any clearance and have not received anything from their office about it..  Patient's procedure is Monday.

## 2024-05-24 ENCOUNTER — Telehealth: Payer: Self-pay | Admitting: Gastroenterology

## 2024-05-24 NOTE — Telephone Encounter (Signed)
 Patient son called and stated that his father is scheduled for a procedure on June the 9 th for a EGD over the [facility] [employer]. Patient son stated that they have received some kind of prep medication he believes but is not sure why his father is needing to take this medication if it does not have anything to do with his EGD. Patient is requesting a call back. Please advise. Best number to reach patient son is [phone].

## 2024-05-24 NOTE — Telephone Encounter (Signed)
 Attempted to call patient's son to discuss prep. No answer, no vm. Phone had a busy dial tone.

## 2024-05-24 NOTE — Telephone Encounter (Signed)
 Called and spoke with patient's son. He states the they received a package in the mail that, "looks like something you would use for a colonoscopy." Son did not have box in front of him, so he wasn't able to tell me for sure what the box was. Advised him that patient is only scheduled for a EGD so he does not have a bowel prep to drink. Son verbalized understanding.

## 2024-05-24 NOTE — Telephone Encounter (Signed)
 Sent Gregory [name] at [facility] the following information from Dr. Danley Dusky:  Preop: The patient is due to have endoscopy.  There are no absolute contraindications from a cardiovascular standpoint.  No preoperative cardiovascular testing is needed.  His risk lies in his advanced age and multiple comorbid illnesses.

## 2024-05-26 NOTE — Anesthesia Preprocedure Evaluation (Signed)
 Anesthesia Evaluation  Patient identified by MRN, date of birth, ID band Patient awake    Reviewed: Allergy & Precautions, H&P , NPO status , Patient's Chart, lab work & pertinent test results  Airway Mallampati: III  TM Distance: >3 FB Neck ROM: Full    Dental no notable dental hx. (+) Teeth Intact, Dental Advisory Given   Pulmonary neg pulmonary ROS, former smoker   Pulmonary exam normal breath sounds clear to auscultation       Cardiovascular Exercise Tolerance: Good hypertension, Pt. on medications and Pt. on home beta blockers + angina  + CAD, + Past MI, + CABG and +CHF   Rhythm:Regular Rate:Normal     Neuro/Psych   Anxiety Depression    CVA    GI/Hepatic negative GI ROS, Neg liver ROS,,,  Endo/Other  negative endocrine ROS    Renal/GU Renal disease  negative genitourinary   Musculoskeletal   Abdominal   Peds  Hematology negative hematology ROS (+)   Anesthesia Other Findings   Reproductive/Obstetrics negative OB ROS                             Anesthesia Physical Anesthesia Plan  ASA: 3  Anesthesia Plan: MAC   Post-op Pain Management: Minimal or no pain anticipated   Induction: Intravenous  PONV Risk Score and Plan: 1 and Propofol  infusion  Airway Management Planned: Natural Airway and Simple Face Mask  Additional Equipment:   Intra-op Plan:   Post-operative Plan:   Informed Consent: I have reviewed the patients History and Physical, chart, labs and discussed the procedure including the risks, benefits and alternatives for the proposed anesthesia with the patient or authorized representative who has indicated his/her understanding and acceptance.     Dental advisory given  Plan Discussed with: CRNA  Anesthesia Plan Comments:        Anesthesia Quick Evaluation

## 2024-05-27 ENCOUNTER — Ambulatory Visit (HOSPITAL_COMMUNITY)
Admission: RE | Admit: 2024-05-27 | Discharge: 2024-05-27 | Disposition: A | Discharge status: Home / Self Care | Attending: Gastroenterology | Admitting: Gastroenterology

## 2024-05-27 ENCOUNTER — Other Ambulatory Visit: Payer: Self-pay

## 2024-05-27 ENCOUNTER — Encounter (HOSPITAL_COMMUNITY): Payer: Self-pay | Admitting: Gastroenterology

## 2024-05-27 ENCOUNTER — Encounter (HOSPITAL_COMMUNITY)
Admission: RE | Disposition: A | Discharge status: Home / Self Care | Payer: Self-pay | Source: Home / Self Care | Attending: Gastroenterology

## 2024-05-27 ENCOUNTER — Ambulatory Visit (HOSPITAL_COMMUNITY): Payer: Self-pay | Admitting: Anesthesiology

## 2024-05-27 DIAGNOSIS — Z7902 Long term (current) use of antithrombotics/antiplatelets: Secondary | ICD-10-CM | POA: Insufficient documentation

## 2024-05-27 DIAGNOSIS — Z85118 Personal history of other malignant neoplasm of bronchus and lung: Secondary | ICD-10-CM

## 2024-05-27 DIAGNOSIS — R933 Abnormal findings on diagnostic imaging of other parts of digestive tract: Secondary | ICD-10-CM | POA: Diagnosis not present

## 2024-05-27 DIAGNOSIS — I252 Old myocardial infarction: Secondary | ICD-10-CM | POA: Insufficient documentation

## 2024-05-27 DIAGNOSIS — I5032 Chronic diastolic (congestive) heart failure: Secondary | ICD-10-CM | POA: Diagnosis not present

## 2024-05-27 DIAGNOSIS — F32A Depression, unspecified: Secondary | ICD-10-CM | POA: Diagnosis not present

## 2024-05-27 DIAGNOSIS — I110 Hypertensive heart disease with heart failure: Secondary | ICD-10-CM | POA: Insufficient documentation

## 2024-05-27 DIAGNOSIS — I2510 Atherosclerotic heart disease of native coronary artery without angina pectoris: Secondary | ICD-10-CM | POA: Diagnosis not present

## 2024-05-27 DIAGNOSIS — Z951 Presence of aortocoronary bypass graft: Secondary | ICD-10-CM | POA: Insufficient documentation

## 2024-05-27 DIAGNOSIS — R1319 Other dysphagia: Secondary | ICD-10-CM

## 2024-05-27 DIAGNOSIS — R1314 Dysphagia, pharyngoesophageal phase: Secondary | ICD-10-CM | POA: Insufficient documentation

## 2024-05-27 DIAGNOSIS — Z87891 Personal history of nicotine dependence: Secondary | ICD-10-CM | POA: Insufficient documentation

## 2024-05-27 DIAGNOSIS — N1832 Chronic kidney disease, stage 3b: Secondary | ICD-10-CM | POA: Diagnosis not present

## 2024-05-27 DIAGNOSIS — F419 Anxiety disorder, unspecified: Secondary | ICD-10-CM | POA: Diagnosis not present

## 2024-05-27 DIAGNOSIS — I509 Heart failure, unspecified: Secondary | ICD-10-CM | POA: Insufficient documentation

## 2024-05-27 DIAGNOSIS — K222 Esophageal obstruction: Secondary | ICD-10-CM | POA: Insufficient documentation

## 2024-05-27 DIAGNOSIS — I130 Hypertensive heart and chronic kidney disease with heart failure and stage 1 through stage 4 chronic kidney disease, or unspecified chronic kidney disease: Secondary | ICD-10-CM | POA: Diagnosis not present

## 2024-05-27 DIAGNOSIS — K2950 Unspecified chronic gastritis without bleeding: Secondary | ICD-10-CM | POA: Diagnosis not present

## 2024-05-27 DIAGNOSIS — K2970 Gastritis, unspecified, without bleeding: Secondary | ICD-10-CM | POA: Diagnosis not present

## 2024-05-27 DIAGNOSIS — K2289 Other specified disease of esophagus: Secondary | ICD-10-CM

## 2024-05-27 DIAGNOSIS — R9389 Abnormal findings on diagnostic imaging of other specified body structures: Secondary | ICD-10-CM

## 2024-05-27 HISTORY — PX: ESOPHAGOGASTRODUODENOSCOPY (EGD) WITH PROPOFOL: SHX5813

## 2024-05-27 SURGERY — ESOPHAGOGASTRODUODENOSCOPY (EGD) WITH PROPOFOL
Anesthesia: Monitor Anesthesia Care

## 2024-05-27 MED ORDER — PROPOFOL 10 MG/ML IV BOLUS
INTRAVENOUS | Status: DC | PRN
  Administered 2024-05-27: 20 mg via INTRAVENOUS
  Administered 2024-05-27: 120 mg via INTRAVENOUS

## 2024-05-27 MED ORDER — LIDOCAINE 2% (20 MG/ML) 5 ML SYRINGE
INTRAMUSCULAR | Status: DC | PRN
Start: 2024-05-27 — End: 2024-05-27
  Administered 2024-05-27: 40 mg via INTRAVENOUS

## 2024-05-27 MED ORDER — PROPOFOL 500 MG/50ML IV EMUL
INTRAVENOUS | Status: AC
Start: 2024-05-27 — End: ?
  Filled 2024-05-27: qty 50

## 2024-05-27 MED ORDER — SODIUM CHLORIDE 0.9 % IV SOLN
INTRAVENOUS | Status: AC | PRN
  Administered 2024-05-27: 500 mL via INTRAMUSCULAR

## 2024-05-27 MED ORDER — SODIUM CHLORIDE 0.9 % IV SOLN: INTRAVENOUS | Status: DC

## 2024-05-27 SURGICAL SUPPLY — 13 items
BLOCK BITE 60FR ADLT L/F BLUE (MISCELLANEOUS) ×1 IMPLANT
ELECTRODE REM PT RTRN 9FT ADLT (ELECTROSURGICAL) IMPLANT
FORCEP RJ3 GP 1.8X160 W-NEEDLE (CUTTING FORCEPS) IMPLANT
FORCEPS BIOP RAD 4 LRG CAP 4 (CUTTING FORCEPS) IMPLANT
NDL SCLEROTHERAPY 25GX240 (NEEDLE) IMPLANT
NEEDLE SCLEROTHERAPY 25GX240 (NEEDLE) IMPLANT
PROBE APC STR FIRE (PROBE) IMPLANT
PROBE INJECTION GOLD 7FR (MISCELLANEOUS) IMPLANT
SNARE SHORT THROW 13M SML OVAL (MISCELLANEOUS) IMPLANT
SYR 50ML LL SCALE MARK (SYRINGE) IMPLANT
TUBING ENDO SMARTCAP PENTAX (MISCELLANEOUS) ×2 IMPLANT
TUBING IRRIGATION ENDOGATOR (MISCELLANEOUS) ×1 IMPLANT
WATER STERILE IRR 1000ML POUR (IV SOLUTION) IMPLANT

## 2024-05-27 NOTE — Interval H&P Note (Signed)
 History and Physical Interval Note:  05/27/2024 7:28 AM  Gregory [name]  has presented today for surgery, with the diagnosis of dysphagia.  The various methods of treatment have been discussed with the patient and family. After consideration of risks, benefits and other options for treatment, the patient has consented to  Procedure(s): ESOPHAGOGASTRODUODENOSCOPY (EGD) WITH PROPOFOL  (N/A) as a surgical intervention.  The patient's history has been reviewed, patient examined, no change in status, stable for surgery.  I have reviewed the patient's chart and labs.  Questions were answered to the patient's satisfaction.     [name]

## 2024-05-27 NOTE — H&P (Signed)
 History and Physical:  This patient presents for endoscopic testing for: Esophageal dysphagia and abnormal GI imaging (CT scan chest showing esophageal abnormality)  89 year old man known to our office from a January 2025 consultation for at least 6 months of dysphagia, and a CT scan of the chest suggesting a patulous esophagus and possible stricture with wall thickening The symptoms have continued, he is here today for upper endoscopy with possible dilation.  He has been off Plavix  last 5 days  Patient is otherwise without complaints or active issues today.   Past Medical History: Past Medical History:  Diagnosis Date   Abdominal aneurysm (HCC)    Anginal pain (HCC)    Coronary artery disease    Depression    Heart disease    History of kidney stones    Hyperlipidemia    Hypertension    Myocardial infarction [facility]) 04/19/2023   NSTEMI   Stroke [facility]) 2004?   TIA     Past Surgical History: Past Surgical History:  Procedure Laterality Date   COLONOSCOPY     CORONARY ANGIOGRAPHY N/A 12/29/2022   Procedure: CORONARY ANGIOGRAPHY;  Surgeon: Avanell Leigh, MD;  Location: [facility] INVASIVE CV LAB;  Service: Cardiovascular;  Laterality: N/A;   CORONARY ARTERY BYPASS GRAFT N/A 05/22/2023   Procedure: CORONARY ARTERY BYPASS GRAFTING (CABG) X 2 WITH LEFT INTERNAL MAMMARY ARTERY HARVEST AND GREATER SAPHENOUS VEIN HARVESTED ENDOSCOPICALLY;  Surgeon: Zelphia Higashi, MD;  Location: [facility] OR;  Service: Open Heart Surgery;  Laterality: N/A;   EYE SURGERY     cataract right   IR ANGIO INTRA EXTRACRAN SEL COM CAROTID INNOMINATE BILAT MOD SED  02/28/2018   IR ANGIO VERTEBRAL SEL SUBCLAVIAN INNOMINATE UNI R MOD SED  02/28/2018   IR RADIOLOGIST EVAL & MGMT  02/12/2021   LEFT HEART CATH AND CORONARY ANGIOGRAPHY N/A 12/26/2022   Procedure: LEFT HEART CATH AND CORONARY ANGIOGRAPHY;  Surgeon: Avanell Leigh, MD;  Location: [facility] INVASIVE CV LAB;  Service: Cardiovascular;  Laterality: N/A;   LEFT  HEART CATH AND CORONARY ANGIOGRAPHY N/A 05/16/2023   Procedure: LEFT HEART CATH AND CORONARY ANGIOGRAPHY;  Surgeon: Lucendia Rusk, MD;  [facility] INVASIVE CV LAB;  Service: Cardiovascular;  Laterality: N/A;   OTHER SURGICAL HISTORY     Carotid    RADIOLOGY WITH ANESTHESIA N/A 02/28/2018   Procedure: STENTING;  Surgeon: Luellen Sages, MD;  Location: [facility] OR;  Service: Radiology;  Laterality: N/A;   TEE WITHOUT CARDIOVERSION N/A 05/22/2023   Procedure: TRANSESOPHAGEAL ECHOCARDIOGRAM;  Surgeon: Zelphia Higashi, MD;  Location: [facility] OR;  Service: Open Heart Surgery;  Laterality: N/A;   VIDEO BRONCHOSCOPY WITH ENDOBRONCHIAL NAVIGATION N/A 04/27/2023   Procedure: VIDEO BRONCHOSCOPY WITH ENDOBRONCHIAL NAVIGATION W/ BIOPSIES;  Surgeon: Zelphia Higashi, MD;  Location: [facility] OR;  Service: Thoracic;  Laterality: N/A;    Allergies: No Known Allergies  Outpatient Meds: Current Facility-Administered Medications  Medication Dose Route Frequency Provider Last Rate Last Admin   0.9 %  sodium chloride  infusion   Intravenous Continuous May, Deanna J, NP       0.9 %  sodium chloride  infusion    Continuous PRN [name], MD 10 mL/hr at 05/27/24 0654 500 mL at 05/27/24 0654      ___________________________________________________________________ Objective   Exam:  BP (!) 140/56   Pulse (!) 42   Temp 98.4 F (36.9 C) (Temporal)   Resp 12   Ht 6' (1.829 m)   Wt 88.5 kg   SpO2 94%  BMI 26.46 kg/m   CV: regular , S1/S2 Resp: clear to auscultation bilaterally, normal RR and effort noted GI: soft, no tenderness, with active bowel sounds.   Assessment: Esophageal dysphagia Abnormal GI imaging   Plan:  EGD with possible dilation  The benefits and risks of the planned procedure(s) were described in detail with the patient or (when appropriate) their health care proxy.  Risks were outlined as including, but not limited to, bleeding, infection, perforation, adverse  medication reaction leading to cardiac or pulmonary decompensation, pancreatitis (if ERCP).  The limitation of incomplete mucosal visualization was also discussed.  No guarantees or warranties were given.  The patient is appropriate for an endoscopic procedure in the ambulatory setting.   - Lorella Roles, MD

## 2024-05-27 NOTE — Anesthesia Postprocedure Evaluation (Signed)
 Anesthesia Post Note  Patient: Gregory [name]  Procedure(s) Performed: ESOPHAGOGASTRODUODENOSCOPY (EGD) WITH PROPOFOL      Patient location during evaluation: Endoscopy Anesthesia Type: MAC Level of consciousness: awake and alert Pain management: pain level controlled Vital Signs Assessment: post-procedure vital signs reviewed and stable Respiratory status: spontaneous breathing, nonlabored ventilation and respiratory function stable Cardiovascular status: stable and blood pressure returned to baseline Postop Assessment: no apparent nausea or vomiting Anesthetic complications: no  No notable events documented.  Last Vitals:  Vitals:   05/27/24 0820 05/27/24 0830  BP: (!) 132/50 138/65  Pulse: (!) 39 (!) 39  Resp: 14 12  Temp:    SpO2: 93% 92%    Last Pain:  Vitals:   05/27/24 0830  TempSrc:   PainSc: 0-No pain                 Gurpreet Mikhail,W. EDMOND

## 2024-05-27 NOTE — Transfer of Care (Signed)
 Immediate Anesthesia Transfer of Care Note  Patient: Gregory [name]  Procedure(s) Performed: ESOPHAGOGASTRODUODENOSCOPY (EGD) WITH PROPOFOL   Patient Location: Endoscopy Unit  Anesthesia Type:MAC  Level of Consciousness: awake, alert , oriented, and patient cooperative  Airway & Oxygen Therapy: Patient Spontanous Breathing and Patient connected to face mask oxygen  Post-op Assessment: Report given to RN and Post -op Vital signs reviewed and stable  Post vital signs: Reviewed and stable  Last Vitals:  Vitals Value Taken Time  BP 111/47 05/27/24 0755  Temp    Pulse 40 05/27/24 0757  Resp 12 05/27/24 0757  SpO2 96 % 05/27/24 0757  Vitals shown include unfiled device data.  Last Pain:  Vitals:   05/27/24 0755  TempSrc:   PainSc: Asleep         Complications: No notable events documented.

## 2024-05-27 NOTE — Op Note (Signed)
 [facility] Patient Name: Gregory [name] Procedure Date: 05/27/2024 MRN: [id number] Attending MD: Roel Clarity. Dominic Friendly , MD, [id number] Date of Birth: 02-02-1934 CSN: [id number] Age: 89 Admit Type: Outpatient Procedure:                Upper GI endoscopy Indications:              Esophageal dysphagia, Abnormal CT of the GI tract                            (December 2024)                           Dysphagia predates patient's radiation for lung                            cancer                           Patient also had CABG in June 2024 Providers:                Roel Clarity. Dominic Friendly, MD, Bradley Caffey, Joline Ned,                            Technician Referring MD:             Alix Isaac Medicines:                Monitored Anesthesia Care Complications:            No immediate complications. Estimated Blood Loss:     Estimated blood loss was minimal. Procedure:                Pre-Anesthesia Assessment:                           - Prior to the procedure, a History and Physical                            was performed, and patient medications and                            allergies were reviewed. The patient's tolerance of                            previous anesthesia was also reviewed. The risks                            and benefits of the procedure and the sedation                            options and risks were discussed with the patient.                            All questions were answered, and informed consent                            was obtained. Prior Anticoagulants: The  patient has                            taken Plavix  (clopidogrel ), last dose was 5 days                            prior to procedure. ASA Grade Assessment: III - A                            patient with severe systemic disease. After                            reviewing the risks and benefits, the patient was                            deemed in satisfactory condition to undergo the                             procedure.                           After obtaining informed consent, the endoscope was                            passed under direct vision. Throughout the                            procedure, the patient's blood pressure, pulse, and                            oxygen saturations were monitored continuously. The                            GIF-H190 ([id number]) Olympus endoscope was introduced                            through the mouth, and advanced to the second part                            of duodenum. The upper GI endoscopy was                            accomplished without difficulty. The patient                            tolerated the procedure well. Scope In: Scope Out: Findings:      The larynx was normal.      An area of extrinsic compression was found in the middle third of the       esophagus, with mild tortuosity and leftward deviation of that section       of the esophagus. No mucosal abnormality in that area. No stricture seen       throughout the entire esophagus.      Esophagus proximal to that with mild dilatation. Scope passed easily  through the entire esophagus including both UES and LES without       resistance.      Patchy inflammation characterized by erosions and erythema was found in       the gastric body and in the gastric antrum. Biopsies were taken with a       cold forceps for histology. (Antrum and body from both lesser and       greater curvatures in same pathology jar to rule out H. pylori)      The exam of the stomach was otherwise normal. Distended well with       insufflation      The cardia and gastric fundus were normal on retroflexion.      The examined duodenum was normal. Impression:               - Normal larynx.                           - Extrinsic compression in the middle third of the                            esophagus.                           - Gastritis. Biopsied.                           - Normal  examined duodenum.                           Review of December 2024 CTAP images suggests this                            endoscopic finding and dysphagia are from mild                            extrinsic compression of the mid esophagus from                            cardiopulmonary structures. No mass, mucosal                            abnormality or stricture seen throughout the                            esophagus. Moderate Sedation:      MAC sedation used Recommendation:           - Patient has a contact number available for                            emergencies. The signs and symptoms of potential                            delayed complications were discussed with the                            patient. Return to normal activities tomorrow.  Written discharge instructions were provided to the                            patient.                           - Resume previous diet. Consider a soft consistency                            diet and meat should be ground when possible.                           - Resume Plavix  (clopidogrel ) at prior dose today.                           - Await pathology results.                           - Caution with eating, cut and chew food well with                            plenty of liquids.                           - Continue care with oncology, including for                            regular CT scans surveillance of lung cancer. Procedure Code(s):        --- Professional ---                           [phone], Esophagogastroduodenoscopy, flexible,                            transoral; with biopsy, single or multiple Diagnosis Code(s):        --- Professional ---                           K22.2, Esophageal obstruction                           K29.70, Gastritis, unspecified, without bleeding                           R13.14, Dysphagia, pharyngoesophageal phase                           R93.3, Abnormal findings on  diagnostic imaging of                            other parts of digestive tract CPT copyright 2022 [employer]. All rights reserved. The codes documented in this report are preliminary and upon coder review may  be revised to meet current compliance requirements. Chayton Murata L. Dominic Friendly, MD 05/27/2024 8:05:53 AM This report has been signed electronically. Number of Addenda: 0

## 2024-05-27 NOTE — Anesthesia Procedure Notes (Signed)
 Procedure Name: MAC Date/Time: 05/27/2024 7:33 AM  Performed by: Mervyn Ace, CRNAPre-anesthesia Checklist: Patient identified, Emergency Drugs available, Suction available, Patient being monitored and Timeout performed Patient Re-evaluated:Patient Re-evaluated prior to induction Oxygen Delivery Method: Simple face mask Preoxygenation: Pre-oxygenation with 100% oxygen (POM mask)

## 2024-05-27 NOTE — Discharge Instructions (Addendum)
 YOU HAD AN ENDOSCOPIC PROCEDURE TODAY: Refer to the procedure report and other information in the discharge instructions given to you for any specific questions about what was found during the examination. If this information does not answer your questions, please call [place] office at [phone] to clarify.   YOU SHOULD EXPECT: Some feelings of bloating in the abdomen. Passage of more gas than usual. Walking can help get rid of the air that was put into your GI tract during the procedure and reduce the bloating.   DIET: Your first meal following the procedure should be a light meal and then it is ok to progress to your normal diet. A half-sandwich or bowl of soup is an example of a good first meal. Heavy or fried foods are harder to digest and may make you feel nauseous or bloated. Drink plenty of fluids but you should avoid alcoholic beverages for 24 hours.    ACTIVITY: Your care partner should take you home directly after the procedure. You should plan to take it easy, moving slowly for the rest of the day. You can resume normal activity the day after the procedure however YOU SHOULD NOT DRIVE, use power tools, machinery or perform tasks that involve climbing or major physical exertion for 24 hours (because of the sedation medicines used during the test).   SYMPTOMS TO REPORT IMMEDIATELY: A gastroenterologist can be reached at any hour. Please call [phone]  for any of the following symptoms:   Following upper endoscopy (EGD, EUS, ERCP, esophageal dilation) Vomiting of blood or coffee ground material  New, significant abdominal pain  New, significant chest pain or pain under the shoulder blades  Painful or persistently difficult swallowing  New shortness of breath  Black, tarry-looking or red, bloody stools  FOLLOW UP:  If any biopsies were taken you will be contacted by phone or by letter within the next 1-3 weeks. Call [phone]  if you have not heard about the biopsies in 3 weeks.   Please also call with any specific questions about appointments or follow up tests. ___________________________  Resume your plavix  (clopidogrel ) at usual dose today.  - Dr. Dominic Friendly

## 2024-05-28 LAB — SURGICAL PATHOLOGY

## 2024-05-29 ENCOUNTER — Encounter (HOSPITAL_COMMUNITY): Payer: Self-pay | Admitting: Gastroenterology

## 2024-05-29 ENCOUNTER — Ambulatory Visit: Payer: Self-pay | Admitting: Gastroenterology

## 2024-05-29 MED ORDER — OMEPRAZOLE 20 MG PO CPDR: 20.0000 mg | DELAYED_RELEASE_CAPSULE | ORAL | 3 refills | Status: DC

## 2024-06-11 NOTE — Progress Notes (Signed)
 Telephone nursing appointment for review of most recent CT-Chest results. I verified patient's identity x2 and began nursing interview.   Patient states issues as follows...  -Pain: Denies -Fatigue: Moderate. Sleeps less in the PM, more during day. -Skin: Denies -Lungs: Denies -Appetite: Good   Patient denies any other related issues at this time.   Meaningful use complete.   Patient aware of their telephone appointment w/ Donald Husband PA-C. I left my extension [phone] in case patient needs anything. Patient verbalized understanding. This concludes the nursing interview.   Patient preferred phone # [phone]   Rosaline Minerva, LPN

## 2024-06-12 ENCOUNTER — Other Ambulatory Visit: Payer: Self-pay | Admitting: Psychiatry

## 2024-06-12 DIAGNOSIS — F5105 Insomnia due to other mental disorder: Secondary | ICD-10-CM

## 2024-06-19 ENCOUNTER — Encounter (HOSPITAL_COMMUNITY): Payer: Self-pay | Admitting: Radiology

## 2024-06-19 ENCOUNTER — Ambulatory Visit (HOSPITAL_COMMUNITY)
Admission: RE | Admit: 2024-06-19 | Discharge: 2024-06-19 | Disposition: A | Discharge status: Home / Self Care | Source: Ambulatory Visit | Attending: Radiation Oncology | Admitting: Radiation Oncology

## 2024-06-19 DIAGNOSIS — C3411 Malignant neoplasm of upper lobe, right bronchus or lung: Secondary | ICD-10-CM | POA: Diagnosis present

## 2024-06-19 LAB — POCT I-STAT CREATININE: Creatinine, Ser: 1.3 mg/dL — ABNORMAL HIGH (ref 0.61–1.24)

## 2024-06-19 MED ORDER — IOHEXOL 300 MG/ML  SOLN
80.0000 mL | Freq: Once | INTRAMUSCULAR | Status: AC | PRN
  Administered 2024-06-19: 80 mL via INTRAVENOUS

## 2024-06-20 ENCOUNTER — Encounter: Payer: Self-pay | Admitting: Radiation Oncology

## 2024-06-24 ENCOUNTER — Ambulatory Visit
Admission: RE | Admit: 2024-06-24 | Discharge: 2024-06-24 | Disposition: A | Discharge status: Home / Self Care | Payer: PPO | Source: Ambulatory Visit | Attending: Radiation Oncology | Admitting: Radiation Oncology

## 2024-06-24 VITALS — Ht 72.0 in | Wt 185.0 lb

## 2024-06-24 DIAGNOSIS — C3411 Malignant neoplasm of upper lobe, right bronchus or lung: Secondary | ICD-10-CM | POA: Insufficient documentation

## 2024-06-24 NOTE — Progress Notes (Signed)
 Radiation Oncology         [phone] ________________________________  Outpatient Follow Up- Conducted via telephone at patient request.  I spoke with the patient to conduct this visit via telephone. The patient was notified in advance and was offered an in person or telemedicine meeting to allow for face to face communication but instead preferred to proceed with a telephone visit.  Name: Gregory [name]        MRN: [id number]  Date of Service: 06/24/2024 DOB: 29-Oct-1934  CC:Tanda Prentice DEL, MD  Tanda Prentice DEL, MD     REFERRING PHYSICIAN: Tanda Prentice DEL, MD   DIAGNOSIS: There were no encounter diagnoses.   HISTORY OF PRESENT ILLNESS: Gregory [name] is a 89 y.o. male with a Stage IA2, cT1N0M0, NSCLC, squamous cell carcinoma of the RUL. He originally presented with chest pain in January 2024. Cardiac catheterization showed mild CAD. Chest CT showed a 1.5 cm spiculated RUL lung nodule. He went home, but returned a day later with recurrent chest pain. He was found to have non-ST elevation MI. Repeat catheterization showed a 70-80% LAD lesion. He was treated medically. A follow-up chest CT on 03/20/23 showed the nodule increased to 1.8 cm in size. PET on 04/25/23 showed a 1.2 cm hypermetabolic lung mass with no evidence of lymphadenopathy or metastatic disease.   Dr. Kerrin was evaluating patient for possible lobectomy of the lung nodule, but he presented with unstable angina in the interim. Patient underwent a CABG and needle biopsy of the right upper lobe nodule on 05/22/23. Pathology revealed squamous cell carcinoma. Considering the surgery he had just undergone, Dr. Kerrin believed radiation may be a good option, and the patient went on to receive stereotactic body radiotherapy (SBRT) which she completed on 10/02/2023.  He has been followed in surveillance since.  His last CT scan of the chest on 12/04/2023 did show some concerns for wall thickening of the esophagus with subtle  nodularity at the mid thoracic esophagus below the carina, and he was referred to [facility].  Dr. Legrand saw him and performed an EGD on 05/27/2024 which showed some extrinsic compression in the middle third of the esophagus and evidence of gastritis.  Biopsies were obtained and negative for H. pylori but confirmed gastritis.  Given the patient has been taking aspirin  for his cardiac history, Dr. Legrand recommended that he also take omeprazole  every other day.  The patient filled a prescription for this but has misplaced it and we discussed the spelling so they could try and find this over-the-counter.  Regarding his lung, he underwent a CT chest with contrast on 06/19/2024 which showed post radiation changes including bandlike opacity in the right upper lobe at the level of the fiducial marker.  He had prominent mediastinal and hilar lymph nodes but these did not meet the size criteria for adenopathy, no new nodules were appreciated in the lungs, and a stable small right pleural effusion remains.  He is contacted today with the help of his son.   PREVIOUS RADIATION THERAPY:   09/27/23-10/02/23 SBRT Treatment Plan Name: Lung_R_SBRT Site: Lung, RUL target,R including 4 mm RUL satellite nodule Technique: SBRT/SRT-IMRT Mode: Photon Dose Per Fraction: 18 Gy Prescribed Dose (Delivered / Prescribed): 54 Gy / 54 Gy Prescribed Fxs (Delivered / Prescribed): 3 / 3   PAST MEDICAL HISTORY:  Past Medical History:  Diagnosis Date   Abdominal aneurysm (HCC)    Anginal pain (HCC)    Coronary artery disease    Depression  Heart disease    History of kidney stones    Hyperlipidemia    Hypertension    Myocardial infarction [facility]) 04/19/2023   NSTEMI   Stroke [facility]) 2004?   TIA       PAST SURGICAL HISTORY: Past Surgical History:  Procedure Laterality Date   COLONOSCOPY     CORONARY ANGIOGRAPHY N/A 12/29/2022   Procedure: CORONARY ANGIOGRAPHY;  Surgeon: Court Dorn PARAS, MD;  Location: [facility] INVASIVE CV  LAB;  Service: Cardiovascular;  Laterality: N/A;   CORONARY ARTERY BYPASS GRAFT N/A 05/22/2023   Procedure: CORONARY ARTERY BYPASS GRAFTING (CABG) X 2 WITH LEFT INTERNAL MAMMARY ARTERY HARVEST AND GREATER SAPHENOUS VEIN HARVESTED ENDOSCOPICALLY;  Surgeon: Kerrin Elspeth BROCKS, MD;  Location: [facility] OR;  Service: Open Heart Surgery;  Laterality: N/A;   ESOPHAGOGASTRODUODENOSCOPY (EGD) WITH PROPOFOL  N/A 05/27/2024   Procedure: ESOPHAGOGASTRODUODENOSCOPY (EGD) WITH PROPOFOL ;  Surgeon: Legrand Victory LITTIE DOUGLAS, MD;  Location: [facility] ENDOSCOPY;  Service: Gastroenterology;  Laterality: N/A;   EYE SURGERY     cataract right   IR ANGIO INTRA EXTRACRAN SEL COM CAROTID INNOMINATE BILAT MOD SED  02/28/2018   IR ANGIO VERTEBRAL SEL SUBCLAVIAN INNOMINATE UNI R MOD SED  02/28/2018   IR RADIOLOGIST EVAL & MGMT  02/12/2021   LEFT HEART CATH AND CORONARY ANGIOGRAPHY N/A 12/26/2022   Procedure: LEFT HEART CATH AND CORONARY ANGIOGRAPHY;  Surgeon: Court Dorn PARAS, MD;  Location: [facility] INVASIVE CV LAB;  Service: Cardiovascular;  Laterality: N/A;   LEFT HEART CATH AND CORONARY ANGIOGRAPHY N/A 05/16/2023   Procedure: LEFT HEART CATH AND CORONARY ANGIOGRAPHY;  Surgeon: Dann Candyce RAMAN, MD;  Location: [facility] INVASIVE CV LAB;  Service: Cardiovascular;  Laterality: N/A;   OTHER SURGICAL HISTORY     Carotid    RADIOLOGY WITH ANESTHESIA N/A 02/28/2018   Procedure: STENTING;  Surgeon: Dolphus Carrion, MD;  Location: [facility] OR;  Service: Radiology;  Laterality: N/A;   TEE WITHOUT CARDIOVERSION N/A 05/22/2023   Procedure: TRANSESOPHAGEAL ECHOCARDIOGRAM;  Surgeon: Kerrin Elspeth BROCKS, MD;  Location: [facility] OR;  Service: Open Heart Surgery;  Laterality: N/A;   VIDEO BRONCHOSCOPY WITH ENDOBRONCHIAL NAVIGATION N/A 04/27/2023   Procedure: VIDEO BRONCHOSCOPY WITH ENDOBRONCHIAL NAVIGATION W/ BIOPSIES;  Surgeon: Kerrin Elspeth BROCKS, MD;  Location: [facility] OR;  Service: Thoracic;  Laterality: N/A;     FAMILY HISTORY:  Family History  Problem Relation  Age of Onset   Breast cancer Mother    Heart disease Mother    Heart disease Brother    Tremor Neg Hx      SOCIAL HISTORY:  reports that he has quit smoking. His smoking use included cigarettes. He has never used smokeless tobacco. He reports that he does not drink alcohol and does not use drugs. The patient is married and lives in [place].    ALLERGIES: Patient has no known allergies.   MEDICATIONS:  Current Outpatient Medications  Medication Sig Dispense Refill   ALPRAZolam  (XANAX ) 0.5 MG tablet Take 0.5-1 mg by mouth in the morning and at bedtime. Take 0.5 mg in the morning and 1 mg at bedtime     amiodarone  (PACERONE ) 200 MG tablet Take 1 tablet (200 mg total) by mouth daily. (Patient taking differently: Take 200 mg by mouth at bedtime.) 90 tablet 3   [Paused] amLODipine  (NORVASC ) 5 MG tablet Take 5 mg by mouth daily.     aspirin  EC 81 MG tablet Take 81 mg by mouth daily. Swallow whole.     atorvastatin  (LIPITOR ) 80 MG tablet Take 1 tablet (80  mg total) by mouth daily. 90 tablet 3   clopidogrel  (PLAVIX ) 75 MG tablet Take 75 mg by mouth daily.     ferrous sulfate  325 (65 FE) MG EC tablet Take 1 tablet (325 mg total) by mouth daily with breakfast. 60 tablet 0   isosorbide  mononitrate (IMDUR ) 30 MG 24 hr tablet TAKE 1/2 OF A TABLET (15 MG TOTAL) BY MOUTH DAILY (Patient taking differently: Take 15 mg by mouth daily.) 45 tablet 3   metoprolol  tartrate (LOPRESSOR ) 25 MG tablet TAKE 1 TABLET BY MOUTH TWICE A DAY 180 tablet 1   mirtazapine  (REMERON ) 7.5 MG tablet TAKE 1 TABLET(7.5 MG) BY MOUTH AT BEDTIME 60 tablet 0   Multiple Vitamins-Minerals (CENTRUM ADULTS PO) Take 1 tablet by mouth daily.      Multiple Vitamins-Minerals (ZINC PO) Take 1 capsule by mouth daily at 12 noon.     nitroGLYCERIN  (NITROSTAT ) 0.4 MG SL tablet Place 0.4 mg under the tongue as needed for chest pain.     Omega-3 Fatty Acids (FISH OIL PO) Take 1 capsule by mouth daily.     omeprazole  (PRILOSEC) 20 MG capsule Take  1 capsule (20 mg total) by mouth every other day. 45 capsule 3   sertraline  (ZOLOFT ) 50 MG tablet TAKE 3 TABLETS(150 MG) BY MOUTH DAILY 270 tablet 2   tamsulosin  (FLOMAX ) 0.4 MG CAPS capsule Take 0.4 mg by mouth at bedtime.     No current facility-administered medications for this encounter.     REVIEW OF SYSTEMS: On review of systems, the patient reports that he is doing well overall. he denies any chest pain, shortness of breath, fevers, chills, night sweats, unintended weight changes. He denies any new musculoskeletal or joint aches or pains. A complete review of systems is obtained and is otherwise negative.     PHYSICAL EXAM:  Wt Readings from Last 3 Encounters:  06/20/24 185 lb (83.9 kg)  05/27/24 195 lb 1.7 oz (88.5 kg)  05/22/24 195 lb (88.5 kg)   Temp Readings from Last 3 Encounters:  05/27/24 97.6 F (36.4 C) (Temporal)  04/25/24 98.5 F (36.9 C) (Oral)  05/28/23 98.1 F (36.7 C) (Oral)   BP Readings from Last 3 Encounters:  05/27/24 138/65  05/22/24 120/68  04/25/24 127/70   Pulse Readings from Last 3 Encounters:  05/27/24 (!) 39  05/22/24 (!) 47  04/25/24 (!) 59   Pain Assessment Pain Score: 0-No pain/10  In general this is a well appearing male in no acute distress. He's alert and oriented x4 and appropriate throughout the examination. Cardiopulmonary assessment is negative for acute distress and he exhibits normal effort.     ECOG = 0  0 - Asymptomatic (Fully active, able to carry on all predisease activities without restriction)  1 - Symptomatic but completely ambulatory (Restricted in physically strenuous activity but ambulatory and able to carry out work of a light or sedentary nature. For example, light housework, office work)  2 - Symptomatic, <50% in bed during the day (Ambulatory and capable of all self care but unable to carry out any work activities. Up and about more than 50% of waking hours)  3 - Symptomatic, >50% in bed, but not bedbound  (Capable of only limited self-care, confined to bed or chair 50% or more of waking hours)  4 - Bedbound (Completely disabled. Cannot carry on any self-care. Totally confined to bed or chair)  5 - Death   Raylene MM, Creech RH, Tormey DC, et al. [phone]). Toxicity and response  criteria of the [facility]. Am. DOROTHA Bridges. Oncol. 5 (6): 649-55    LABORATORY DATA:  Lab Results  Component Value Date   WBC 6.0 04/24/2024   HGB 12.0 (L) 04/24/2024   HCT 37.6 (L) 04/24/2024   MCV 98.2 04/24/2024   PLT 111 (L) 04/24/2024   Lab Results  Component Value Date   NA 132 (L) 04/25/2024   K 4.2 04/25/2024   CL 100 04/25/2024   CO2 21 (L) 04/25/2024   Lab Results  Component Value Date   ALT 186 (H) 04/25/2024   AST 197 (H) 04/25/2024   ALKPHOS 74 04/25/2024   BILITOT 0.9 04/25/2024      RADIOGRAPHY: CT CHEST W CONTRAST Result Date: 06/20/2024 CLINICAL DATA:  Non-small cell lung cancer (NSCLC), monitor S/P SBRT. * Tracking Code: BO * EXAM: CT CHEST WITH CONTRAST TECHNIQUE: Multidetector CT imaging of the chest was performed during intravenous contrast administration. RADIATION DOSE REDUCTION: This exam was performed according to the departmental dose-optimization program which includes automated exposure control, adjustment of the mA and/or kV according to patient size and/or use of iterative reconstruction technique. CONTRAST:  80mL OMNIPAQUE  IOHEXOL  300 MG/ML  SOLN COMPARISON:  CT scan chest from 12/04/2023. FINDINGS: Cardiovascular: Normal cardiac size. No pericardial effusion. No aortic aneurysm. There are coronary artery calcifications, in keeping with coronary artery disease. There are also moderate to severe peripheral atherosclerotic vascular calcifications of thoracic aorta and its major branches. Mediastinum/Nodes: Visualized thyroid gland appears grossly unremarkable. No solid / cystic mediastinal masses. The esophagus is nondistended precluding optimal assessment. There  are few mildly prominent mediastinal and hilar lymph nodes, which do not meet the size criteria for lymphadenopathy and appear grossly similar to the prior study. No axillary lymphadenopathy by size criteria. Lungs/Pleura: The central tracheo-bronchial tree is patent. Redemonstration of a linear 5-6 mm metallic fiducial marker in the right upper lobe apical segment, with interval change in orientation when compared to the prior exam, likely due to underlying lung parenchymal changes. There is a new bandlike opacity in the right upper lobe apical segment extending superior to inferior from right lateral apex to the right suprahilar region. There is associated mild bronchiectatic changes. This opacity covers the region of previously seen irregular nodular opacity, which is not separately seen on today's exam. Findings are most likely related to post radiotherapy changes. However, continued attention on follow-up examination is recommended to exclude underlying residual or recurrent neoplasm. There is stable small right pleural effusion including right major fissural effusion. Redemonstration of bilateral peripheral/subpleural reticulations and patchy areas of scarring/atelectasis throughout bilateral lungs without significant interval change. No left pleural effusion. No lung mass, lung collapse or pneumothorax. No suspicious lung nodules. There are scattered punctate calcified granulomas throughout bilateral lungs. Upper Abdomen: Visualized upper abdominal viscera within normal limits. Musculoskeletal: The visualized soft tissues of the chest wall are grossly unremarkable. No suspicious osseous lesions. There are mild multilevel degenerative changes in the visualized spine. IMPRESSION: 1. There is a new bandlike opacity in the right upper lobe, as described in detail above. Findings are most likely related to post radiotherapy changes. However, continued attention on follow-up examination is recommended to exclude  underlying residual or recurrent neoplasm. 2. No metastatic disease identified within the chest. 3. Multiple other nonacute observations, as described above Aortic Atherosclerosis (ICD10-I70.0). Electronically Signed   By: Ree Molt M.D.   On: 06/20/2024 10:58       IMPRESSION/PLAN: 1. Stage IA2, cT1N0M0, NSCLC, squamous cell carcinoma of  the RUL. The patient will continue to be followed in surveillance and we discussed a repeat CT scan of the chest in 6 months time. He is in agreement with this plan. 2. Gastritis and extrinsic compression of the middle third esophagus. Dr. Legrand recommended PPI therapy and we will follow this expectantly. The patient will try taking smaller bites or eat softer foods as well.  3. Right Pleural effusion. This is felt to be stable and likely related to his cardiac history. We will follow this expectantly.   This encounter was conducted via telephone.  The patient has provided two factor identification and has given verbal consent for this type of encounter and has been advised to only accept a meeting of this type in a secure network environment. The time spent during this encounter was 35 minutes including preparation, discussion, and coordination of the patient's care. The attendants for this meeting include  Donald Estefana Husband, [name], and Ozell Roses.  During the encounter, Donald Estefana Husband was located at [facility] Radiation Oncology Department.  [name] was located at home with his son Rivers Hamrick.   **Disclaimer: This note was dictated with voice recognition software. Similar sounding words can inadvertently be transcribed and this note may contain transcription errors which may not have been corrected upon publication of note.**

## 2024-07-05 ENCOUNTER — Telehealth: Payer: Self-pay | Admitting: Cardiology

## 2024-07-05 NOTE — Telephone Encounter (Signed)
 Office calling to speak with Dr. Lavona about the patient's EKG that he had done in their office. Please advise   They ask that you make sure to get push through to office, do not accept they will send a message back.

## 2024-07-13 ENCOUNTER — Other Ambulatory Visit: Payer: Self-pay | Admitting: Psychiatry

## 2024-07-13 DIAGNOSIS — F3181 Bipolar II disorder: Secondary | ICD-10-CM

## 2024-07-13 NOTE — Telephone Encounter (Signed)
Does he have enough to get to appt

## 2024-07-15 NOTE — Telephone Encounter (Signed)
 LVM per DPR, does he have enough medication to get to appt

## 2024-07-17 ENCOUNTER — Ambulatory Visit (INDEPENDENT_AMBULATORY_CARE_PROVIDER_SITE_OTHER): Payer: 59 | Admitting: Psychiatry

## 2024-07-17 ENCOUNTER — Encounter: Payer: Self-pay | Admitting: Psychiatry

## 2024-07-17 DIAGNOSIS — F3181 Bipolar II disorder: Secondary | ICD-10-CM | POA: Diagnosis not present

## 2024-07-17 DIAGNOSIS — F5105 Insomnia due to other mental disorder: Secondary | ICD-10-CM | POA: Diagnosis not present

## 2024-07-17 DIAGNOSIS — F39 Unspecified mood [affective] disorder: Secondary | ICD-10-CM | POA: Diagnosis not present

## 2024-07-17 MED ORDER — MIRTAZAPINE 7.5 MG PO TABS: ORAL_TABLET | ORAL | 2 refills | Status: AC

## 2024-07-17 MED ORDER — SERTRALINE HCL 50 MG PO TABS: ORAL_TABLET | ORAL | 2 refills | Status: AC

## 2024-07-17 NOTE — Progress Notes (Signed)
 Gregory [name] [id number] Jun 02, 1934 89 y.o.  Subjective:   Patient ID:  Gregory [name] is a 89 y.o. (DOB January 26, 1934) male.  Chief Complaint:  Chief Complaint  Patient presents with   Follow-up    HPI Gregory [name] presents to the office today for follow-up of bipolar 2 depression and chronic insomnia with shift related sleep problems.  seen 07/30/2019 with the folloowing noted:  Seen with wife.  Forced into Covid. Retirement.  No major complaints re: sx.  Patient reports stable mood and denies depressed or irritable moods.  Patient denies any recent difficulty with anxiety.  Patient denies difficulty with sleep initiation or maintenance.  Goes to bed until 2-3 AM and up about 11 AM.  Denies appetite disturbance.  Patient reports that energy and motivation have been good.  Patient denies any difficulty with concentration.  Patient denies any suicidal ideation. Wife only concern is his sleep pattern.   He says he goes to sleep fine.  Plan no med changes  06/26/20 Gregory [name], spouse, called to report that [name] decreased/changed how he is taking is medication.  She is not sure when he did this, but she now seeing signs that the change is affecting him.  He is becoming confused.  Last 2 nights couldn't mae out he words.  He is traveling with his son and has done some little parts in their program, but it hasn't good the last 2 nights.  He is repeating questions.  Sleeping a bit later in the mornings.  He claims he is fine: Not depressed.  But [name] is seeing him decline slightly and would you to talk with him about how he is taking his medications and the affects changes could cause.  Has appt 8/19 but doesn't want to wait that long for someone to talk with him.  His cell is [phone].   MD response:The only thing he's taking from me is sertraline  and mirtazapine  and changes in either of those should not cause confusion.  I assume he's not overtaking the Xanax  he takes for sleep bc that can  cause those problems especially in the morning.  I'm not prescribing that as I recall. I'm concerned he may have had another stroke.  They need to get him to the ER ASAP to be sure.  08/06/20 appt with the following noted: As far as I'm concerned I'm good.  Not depressed.  Concerned about world situation.   Had cut down sertraline  from 150 to 100 mg and sons said he got confused but he didn't notice it.  That is cleared up now.  She says he's back to normal. Cut down on alprazolam  at night to 0.5 mg HS.  And still taking mirtazapine  7.5 mg HS.  Still delayed sleep cycle.  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  Tired of doing nothing.    Plan: No med changes  04/01/2021 appointment with following noted: Doing well.  No major complaints.  No mania acknowledged.  Continues to be active in [place] music.  MRI and physical OK.  Tremor is still there intermittently.  One hand weaker. Thankful doing well.  Wife not doing as well as he'd like from the cancer.  She's dizzy.   What put me in depression both times was money but  Over it. Mirtazpaine helping sleep. Patient reports stable mood and denies depressed or irritable moods.  Patient denies any recent difficulty with anxiety.  Patient denies difficulty with sleep initiation or maintenance. Denies appetite disturbance.  Patient  reports that energy and motivation have been good.  Patient denies any difficulty with concentration.  Patient denies any suicidal ideation. Plan: No med changes.  Continue alprazolam  0.5 mg and mirtazapine  7.5 mg nightly for sleep. Continue sertraline  150 mg daily  10/14/2021 appointment with the following noted: No depression.  Patient reports stable mood and denies depressed or irritable moods.  Patient denies any recent difficulty with anxiety.  Patient denies difficulty with sleep initiation or maintenance. Denies appetite disturbance.  Patient reports that energy and motivation have been good.  Patient denies any  difficulty with concentration.  Patient denies any suicidal ideation. Health good.   No SE.  Occ tremor.  No sig caffeine.   Consistent with sertraline  150 mg daily. Enjoys farm work and music.   Plan:   01/10/23 appt noted: Had 2 heart attacks since here.  Gregory [name] and then in ER 12/23/22.  Had another episode.  Seen cardiologist.   Feels good.  Not depressed.  Don't even think about. Sleep is perfect.  With mirtazapine  and alprazolam .  To bed 2 AM.   No SE.  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 2 small MI prior to surgery.   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   Patient reports stable mood and denies depressed or irritable moods.  Patient denies any recent difficulty with anxiety.  Patient denies difficulty with sleep initiation or maintenance. Denies appetite disturbance.  Patient reports that energy and motivation have been good.  Patient denies any difficulty with concentration.  Patient denies any suicidal ideation. Pleased with meds. Worry over youngest son with drinking problem.   07/17/24 appt noted:  Meds: alprazolam  0.5 mg HS, mirtazapine  7.5 mg HS, sertraline  150 Doing good.  No complaints of dep.  No SE.   Sleep good.  No new concerns.  Wife not doing well.  Ongoing dizziness.    Past Psychiatric Medication Trials: Depakote side effects, lithium tremor, Wellbutrin side effects, duloxetine, Pristiq, sertraline , Abilify lost response, buspirone, pramipexole 0.125, lamotrigine no response, Latuda 40, Rexulti, buspirone, methylphenidate, methyl folate, olanzapine, Lunesta, Ambien, Xanax , mirtazapine , trazodone, temazepam, propranolol  Under my psychiatric care since 2005  Review of Systems:  Review of Systems  Cardiovascular:  Positive for chest pain. Negative for palpitations.  Neurological:  Positive for tremors. Negative for dizziness and weakness.  Psychiatric/Behavioral:   Negative for dysphoric mood. The patient is not nervous/anxious.     Medications: I have reviewed the patient's current medications.  Current Outpatient Medications  Medication Sig Dispense Refill   ALPRAZolam  (XANAX ) 0.5 MG tablet Take 0.5-1 mg by mouth in the morning and at bedtime. Take 0.5 mg in the morning and 1 mg at bedtime     amiodarone  (PACERONE ) 200 MG tablet Take 1 tablet (200 mg total) by mouth daily. (Patient taking differently: Take 200 mg by mouth at bedtime.) 90 tablet 3   [Paused] amLODipine  (NORVASC ) 5 MG tablet Take 5 mg by mouth daily.     aspirin  EC 81 MG tablet Take 81 mg by mouth daily. Swallow whole.     atorvastatin  (LIPITOR ) 80 MG tablet Take 1 tablet (80 mg total) by mouth daily. 90 tablet 3   clopidogrel  (PLAVIX ) 75 MG tablet Take 75 mg by mouth daily.     isosorbide  mononitrate (IMDUR ) 30 MG 24 hr tablet TAKE 1/2 OF A TABLET (15 MG TOTAL) BY MOUTH DAILY (Patient taking differently: Take 15  mg by mouth daily.) 45 tablet 3   metoprolol  tartrate (LOPRESSOR ) 25 MG tablet TAKE 1 TABLET BY MOUTH TWICE A DAY 180 tablet 1   Multiple Vitamins-Minerals (CENTRUM ADULTS PO) Take 1 tablet by mouth daily.      Multiple Vitamins-Minerals (ZINC PO) Take 1 capsule by mouth daily at 12 noon.     nitroGLYCERIN  (NITROSTAT ) 0.4 MG SL tablet Place 0.4 mg under the tongue as needed for chest pain.     Omega-3 Fatty Acids (FISH OIL PO) Take 1 capsule by mouth daily.     omeprazole  (PRILOSEC) 20 MG capsule Take 1 capsule (20 mg total) by mouth every other day. 45 capsule 3   tamsulosin  (FLOMAX ) 0.4 MG CAPS capsule Take 0.4 mg by mouth at bedtime.     ferrous sulfate  325 (65 FE) MG EC tablet Take 1 tablet (325 mg total) by mouth daily with breakfast. 60 tablet 0   mirtazapine  (REMERON ) 7.5 MG tablet TAKE 1 TABLET(7.5 MG) BY MOUTH AT BEDTIME 90 tablet 2   sertraline  (ZOLOFT ) 50 MG tablet TAKE 3 TABLETS(150 MG) BY MOUTH DAILY 270 tablet 2   No current facility-administered medications for  this visit.    Medication Side Effects: None  Allergies:  No Known Allergies   Past Medical History:  Diagnosis Date   Abdominal aneurysm (HCC)    Anginal pain (HCC)    Coronary artery disease    Depression    Heart disease    History of kidney stones    Hyperlipidemia    Hypertension    Myocardial infarction [facility]) 04/19/2023   NSTEMI   Stroke [facility]) 2004?   TIA    Family History  Problem Relation Age of Onset   Breast cancer Mother    Heart disease Mother    Heart disease Brother    Tremor Neg Hx     Social History   Socioeconomic History   Marital status: Married    Spouse name: Not on file   Number of children: 2   Years of education: Not on file   Highest education level: Some college, no degree  Occupational History   Not on file  Tobacco Use   Smoking status: Former    Types: Cigarettes   Smokeless tobacco: Never  Vaping Use   Vaping status: Never Used  Substance and Sexual Activity   Alcohol use: No   Drug use: No   Sexual activity: Yes  Other Topics Concern   Not on file  Social History Narrative   Lives at home w/ his wife   Right-handed   Caffeine: occasional coffee and tea, decaf tea at home   Social Drivers of Health   Financial Resource Strain: Not on file  Food Insecurity: No Food Insecurity (06/20/2024)   Hunger Vital Sign    Worried About Running Out of Food in the Last Year: Never true    Ran Out of Food in the Last Year: Never true  Transportation Needs: No Transportation Needs (06/20/2024)   PRAPARE - Administrator, Civil Service (Medical): No    Lack of Transportation (Non-Medical): No  Physical Activity: Not on file  Stress: Not on file  Social Connections: Moderately Integrated (04/24/2024)   Social Connection and Isolation Panel    Frequency of Communication with Friends and Family: Three times a week    Frequency of Social Gatherings with Friends and Family: Twice a week    Attends Religious Services: More than  4 times per year  Active Member of Clubs or Organizations: No    Attends Banker Meetings: Never    Marital Status: Married  Catering manager Violence: Not At Risk (06/20/2024)   Humiliation, Afraid, Rape, and Kick questionnaire    Fear of Current or Ex-Partner: No    Emotionally Abused: No    Physically Abused: No    Sexually Abused: No    Past Medical History, Surgical history, Social history, and Family history were reviewed and updated as appropriate.   Please see review of systems for further details on the patient's review from today.   Objective:   Physical Exam:  There were no vitals taken for this visit.  Physical Exam Constitutional:      General: He is not in acute distress.    Appearance: He is well-developed.  Musculoskeletal:        General: No deformity.  Neurological:     Mental Status: He is alert and oriented to person, place, and time.     Coordination: Coordination normal.  Psychiatric:        Attention and Perception: Attention and perception normal. He does not perceive auditory or visual hallucinations.        Mood and Affect: Mood normal. Mood is not anxious or depressed. Affect is not labile, blunt or inappropriate.        Speech: Speech normal. Speech is not rapid and pressured or slurred.        Behavior: Behavior normal. Behavior is not slowed.        Thought Content: Thought content normal. Thought content is not paranoid or delusional. Thought content does not include homicidal or suicidal ideation. Thought content does not include suicidal plan.        Cognition and Memory: Cognition and memory normal.        Judgment: Judgment normal.     Comments: Insight intact Good humor.  Mild forgetfulness.  Mild word finding issues     Lab Review:     Component Value Date/Time   NA 132 (L) 04/25/2024 0537   NA 139 06/12/2023 1559   K 4.2 04/25/2024 0537   CL 100 04/25/2024 0537   CO2 21 (L) 04/25/2024 0537   GLUCOSE 198 (H)  04/25/2024 0537   BUN 25 (H) 04/25/2024 0537   BUN 14 06/12/2023 1559   CREATININE 1.30 (H) 06/19/2024 1353   CALCIUM  8.9 04/25/2024 0537   PROT 6.6 04/25/2024 0537   ALBUMIN  3.2 (L) 04/25/2024 0537   AST 197 (H) 04/25/2024 0537   ALT 186 (H) 04/25/2024 0537   ALKPHOS 74 04/25/2024 0537   BILITOT 0.9 04/25/2024 0537   GFRNONAA >60 04/25/2024 0537   GFRAA >60 02/27/2018 1542       Component Value Date/Time   WBC 6.0 04/24/2024 0608   RBC 3.83 (L) 04/24/2024 0608   HGB 12.0 (L) 04/24/2024 0608   HCT 37.6 (L) 04/24/2024 0608   PLT 111 (L) 04/24/2024 0608   MCV 98.2 04/24/2024 0608   MCH 31.3 04/24/2024 0608   MCHC 31.9 04/24/2024 0608   RDW 13.6 04/24/2024 0608   LYMPHSABS 0.7 04/24/2024 0608   MONOABS 0.4 04/24/2024 0608   EOSABS 0.0 04/24/2024 0608   BASOSABS 0.0 04/24/2024 0608    No results found for: POCLITH, LITHIUM   No results found for: PHENYTOIN, PHENOBARB, VALPROATE, CBMZ   .res Assessment: Plan:    Joevon was seen today for follow-up.  Diagnoses and all orders for this visit:  Episodic mood disorder (HCC)  Insomnia due to mental condition -     mirtazapine  (REMERON ) 7.5 MG tablet; TAKE 1 TABLET(7.5 MG) BY MOUTH AT BEDTIME  Bipolar II disorder (HCC) -     sertraline  (ZOLOFT ) 50 MG tablet; TAKE 3 TABLETS(150 MG) BY MOUTH DAILY   30 min face to face time with patient was spent.  We discussed Mr. Lotz has not had any mood symptoms in the past couple of years..  He has had repeated bouts of depression with occasional of hypomania some of which have lasted months.  He has not done well on mood stabilizers as they have been poorly tolerated.  We have discussed the very significant risk of mood instability mood and mood swings since he is not taking a mood stabilizer.  He is willing to take that risk at this time.  He states he will notify me if he starts having hypomania or depression again.  He is aware of the risks that antidepressants can cause  mood swings in bipolar patients but he has not been able to get off of the antidepressant without recurrence of depression.  Unfortunately he does not tend to recognize his hypomania very well but the family does recognize it.  he remained stable.  He has a delayed sleep phase but it is not associated with any significant disability.  There is significant concern that he has had a hypomanic episodes in the past some of which have been reasonably prolonged and he is not on a mood stabilizer.  This has been discussed with he and his wife extensively in previouus appts.  Unfortunately he has not tolerated any of the mood stabilizers that he has taken.  They are aware of the risk of future hypomania.  Fortunately he has not had severe manic episodes but his hypomanic episodes have been to disruptive at times.  He seems stable now.  He has not been able to stop antidepressants without depression recurring and has no interest in stopping antidepressants because of this.  The disability has been far worse from depression in the past and it has been from hypomania.  Tremor stable but not gone  No med changes indicated doing very well.  Continue alprazolam  0.5 mg and mirtazapine  7.5 mg nightly for sleep. Continue sertraline  150 mg daily  Follow-up  6 months or sooner as needed  Lorene Macintosh MD, DFAPA  Please see After Visit Summary for patient specific instructions.  Future Appointments  Date Time Provider Department Center  09/25/2024 11:20 AM Lavona Agent, MD [facility]    No orders of the defined types were placed in this encounter.   -------------------------------

## 2024-07-18 ENCOUNTER — Emergency Department (HOSPITAL_COMMUNITY)
Admission: EM | Admit: 2024-07-18 | Discharge: 2024-07-19 | Disposition: A | Discharge status: Home / Self Care | Attending: Emergency Medicine | Admitting: Emergency Medicine

## 2024-07-18 ENCOUNTER — Emergency Department (HOSPITAL_COMMUNITY)

## 2024-07-18 ENCOUNTER — Other Ambulatory Visit: Payer: Self-pay

## 2024-07-18 DIAGNOSIS — Z7982 Long term (current) use of aspirin: Secondary | ICD-10-CM | POA: Insufficient documentation

## 2024-07-18 DIAGNOSIS — I509 Heart failure, unspecified: Secondary | ICD-10-CM | POA: Insufficient documentation

## 2024-07-18 DIAGNOSIS — Z79899 Other long term (current) drug therapy: Secondary | ICD-10-CM | POA: Insufficient documentation

## 2024-07-18 DIAGNOSIS — S066X1A Traumatic subarachnoid hemorrhage with loss of consciousness of 30 minutes or less, initial encounter: Secondary | ICD-10-CM | POA: Diagnosis not present

## 2024-07-18 DIAGNOSIS — Z7901 Long term (current) use of anticoagulants: Secondary | ICD-10-CM | POA: Diagnosis not present

## 2024-07-18 DIAGNOSIS — N189 Chronic kidney disease, unspecified: Secondary | ICD-10-CM | POA: Insufficient documentation

## 2024-07-18 DIAGNOSIS — R55 Syncope and collapse: Secondary | ICD-10-CM | POA: Diagnosis not present

## 2024-07-18 DIAGNOSIS — S0990XA Unspecified injury of head, initial encounter: Secondary | ICD-10-CM | POA: Diagnosis present

## 2024-07-18 DIAGNOSIS — W01198A Fall on same level from slipping, tripping and stumbling with subsequent striking against other object, initial encounter: Secondary | ICD-10-CM | POA: Diagnosis not present

## 2024-07-18 DIAGNOSIS — I130 Hypertensive heart and chronic kidney disease with heart failure and stage 1 through stage 4 chronic kidney disease, or unspecified chronic kidney disease: Secondary | ICD-10-CM | POA: Diagnosis not present

## 2024-07-18 DIAGNOSIS — I2510 Atherosclerotic heart disease of native coronary artery without angina pectoris: Secondary | ICD-10-CM | POA: Diagnosis not present

## 2024-07-18 DIAGNOSIS — S0291XA Unspecified fracture of skull, initial encounter for closed fracture: Secondary | ICD-10-CM | POA: Diagnosis not present

## 2024-07-18 DIAGNOSIS — Y9301 Activity, walking, marching and hiking: Secondary | ICD-10-CM | POA: Insufficient documentation

## 2024-07-18 LAB — CBC WITH DIFFERENTIAL/PLATELET
Abs Immature Granulocytes: 0.01 K/uL (ref 0.00–0.07)
Basophils Absolute: 0.1 K/uL (ref 0.0–0.1)
Basophils Relative: 1 %
Eosinophils Absolute: 0.2 K/uL (ref 0.0–0.5)
Eosinophils Relative: 3 %
HCT: 36.3 % — ABNORMAL LOW (ref 39.0–52.0)
Hemoglobin: 12 g/dL — ABNORMAL LOW (ref 13.0–17.0)
Immature Granulocytes: 0 %
Lymphocytes Relative: 20 %
Lymphs Abs: 1.1 K/uL (ref 0.7–4.0)
MCH: 32 pg (ref 26.0–34.0)
MCHC: 33.1 g/dL (ref 30.0–36.0)
MCV: 96.8 fL (ref 80.0–100.0)
Monocytes Absolute: 0.5 K/uL (ref 0.1–1.0)
Monocytes Relative: 10 %
Neutro Abs: 3.6 K/uL (ref 1.7–7.7)
Neutrophils Relative %: 66 %
Platelets: 142 K/uL — ABNORMAL LOW (ref 150–400)
RBC: 3.75 MIL/uL — ABNORMAL LOW (ref 4.22–5.81)
RDW: 13.6 % (ref 11.5–15.5)
WBC: 5.4 K/uL (ref 4.0–10.5)
nRBC: 0 % (ref 0.0–0.2)

## 2024-07-18 LAB — COMPREHENSIVE METABOLIC PANEL WITH GFR
ALT: 80 U/L — ABNORMAL HIGH (ref 0–44)
AST: 117 U/L — ABNORMAL HIGH (ref 15–41)
Albumin: 3 g/dL — ABNORMAL LOW (ref 3.5–5.0)
Alkaline Phosphatase: 149 U/L — ABNORMAL HIGH (ref 38–126)
Anion gap: 9 (ref 5–15)
BUN: 16 mg/dL (ref 8–23)
CO2: 22 mmol/L (ref 22–32)
Calcium: 8.8 mg/dL — ABNORMAL LOW (ref 8.9–10.3)
Chloride: 103 mmol/L (ref 98–111)
Creatinine, Ser: 1.32 mg/dL — ABNORMAL HIGH (ref 0.61–1.24)
GFR, Estimated: 52 mL/min — ABNORMAL LOW (ref >60)
Glucose, Bld: 192 mg/dL — ABNORMAL HIGH (ref 70–99)
Potassium: 3.9 mmol/L (ref 3.5–5.1)
Sodium: 134 mmol/L — ABNORMAL LOW (ref 135–145)
Total Bilirubin: 1.3 mg/dL — ABNORMAL HIGH (ref 0.0–1.2)
Total Protein: 7.1 g/dL (ref 6.5–8.1)

## 2024-07-18 LAB — URINALYSIS, ROUTINE W REFLEX MICROSCOPIC
Bacteria, UA: NONE SEEN
Bilirubin Urine: NEGATIVE
Glucose, UA: NEGATIVE mg/dL
Hgb urine dipstick: NEGATIVE
Ketones, ur: NEGATIVE mg/dL
Nitrite: NEGATIVE
Protein, ur: NEGATIVE mg/dL
Specific Gravity, Urine: 1.012 (ref 1.005–1.030)
pH: 6 (ref 5.0–8.0)

## 2024-07-18 NOTE — Discharge Instructions (Signed)
 Your workup this evening shows a small area of bleeding in your brain that was likely secondary to your fall.  The repeat CT of your head shows that this is stable.  As discussed, call the neurosurgeon office listed to arrange follow-up appointment.  Return to the emergency department for any new or worsening symptoms.  Avoid strenuous activity

## 2024-07-18 NOTE — ED Notes (Signed)
 Patient transported to CT  for repeat CT

## 2024-07-18 NOTE — ED Triage Notes (Addendum)
 Pt BIBEMS status post syncope/fall. Normally A&Ox4, A&Ox2 on EMS arrival to pt. In c collar. On blood thinners. EMS reports baseline slurred speech. SB for duration of EMS trip. CBG 139.

## 2024-07-18 NOTE — ED Provider Notes (Signed)
 [place] EMERGENCY DEPARTMENT AT [facility] Provider Note   CSN: [id number] Arrival date & time: 07/18/24  1343     Patient presents with: Gregory [name] is a 89 y.o. male.    Fall Pertinent negatives include no chest pain, no abdominal pain and no shortness of breath.       Gregory [name] is a 89 y.o. male past medical history of prior stroke/TIA, hypertension, CKD CHF coronary artery disease, takes aspirin  and Plavix  daily, who presents to the Emergency Department for evaluation of fall.  Patient's son is at bedside and also provides history.  States that his father stood up in the sun room and was attempting to walk to another room of the house when he fell.  It was believed that the patient fell forward and struck his head on the floor.  Son states that fall was not witnessed but he heard his father fall and when he got to him he was breathing but his eyes were closed.  After few seconds, he opened his eyes and was talking normally.  Son endorses slightly slurred speech at baseline due to prior strokes.  Patient denies any pain, headache, dizziness, visual changes nausea or vomiting.  No chest pain or shortness of breath.  Does have history of low heart rate and takes Pacerone    Prior to Admission medications   Medication Sig Start Date End Date Taking? Authorizing Provider  ALPRAZolam  (XANAX ) 0.5 MG tablet Take 0.5-1 mg by mouth in the morning and at bedtime. Take 0.5 mg in the morning and 1 mg at bedtime 03/17/17   [provider]  amiodarone  (PACERONE ) 200 MG tablet Take 1 tablet (200 mg total) by mouth daily. Patient taking differently: Take 200 mg by mouth at bedtime. 08/01/23   Duke, Jon Garre, PA  amLODipine  (NORVASC ) 5 MG tablet Take 5 mg by mouth daily. 07/28/23   [provider]  aspirin  EC 81 MG tablet Take 81 mg by mouth daily. Swallow whole.    [provider]  atorvastatin  (LIPITOR ) 80 MG tablet Take 1 tablet (80 mg  total) by mouth daily. 12/27/22   Meng, Hao, PA  clopidogrel  (PLAVIX ) 75 MG tablet Take 75 mg by mouth daily. 05/31/23   [provider]  ferrous sulfate  325 (65 FE) MG EC tablet Take 1 tablet (325 mg total) by mouth daily with breakfast. 05/28/23 05/22/24  Dwan Kyla HERO, PA-C  isosorbide  mononitrate (IMDUR ) 30 MG 24 hr tablet TAKE 1/2 OF A TABLET (15 MG TOTAL) BY MOUTH DAILY Patient taking differently: Take 15 mg by mouth daily. 12/21/23   Lavona Agent, MD  metoprolol  tartrate (LOPRESSOR ) 25 MG tablet TAKE 1 TABLET BY MOUTH TWICE A DAY 04/05/24   Lavona Agent, MD  mirtazapine  (REMERON ) 7.5 MG tablet TAKE 1 TABLET(7.5 MG) BY MOUTH AT BEDTIME 07/17/24   Cottle, Lorene KANDICE Raddle., MD  Multiple Vitamins-Minerals (CENTRUM ADULTS PO) Take 1 tablet by mouth daily.     [provider]  Multiple Vitamins-Minerals (ZINC PO) Take 1 capsule by mouth daily at 12 noon.    [provider]  nitroGLYCERIN  (NITROSTAT ) 0.4 MG SL tablet Place 0.4 mg under the tongue as needed for chest pain.    [provider]  Omega-3 Fatty Acids (FISH OIL PO) Take 1 capsule by mouth daily.    [provider]  omeprazole  (PRILOSEC) 20 MG capsule Take 1 capsule (20 mg total) by mouth every other day. 05/29/24  Legrand Victory LITTIE DOUGLAS, MD  sertraline  (ZOLOFT ) 50 MG tablet TAKE 3 TABLETS(150 MG) BY MOUTH DAILY 07/17/24   Cottle, Lorene KANDICE Raddle., MD  tamsulosin  (FLOMAX ) 0.4 MG CAPS capsule Take 0.4 mg by mouth at bedtime. 10/24/23   [provider]    Allergies: Patient has no known allergies.    Review of Systems  Constitutional:  Negative for chills and fever.  Eyes:  Negative for visual disturbance.  Respiratory:  Negative for shortness of breath.   Cardiovascular:  Negative for chest pain.  Gastrointestinal:  Negative for abdominal pain, nausea and vomiting.  Musculoskeletal:  Negative for arthralgias, back pain, neck pain and neck stiffness.  Skin:  Negative for wound.  Neurological:   Negative for dizziness, seizures, syncope, facial asymmetry, weakness and light-headedness.  Psychiatric/Behavioral:  Negative for confusion.     Updated Vital Signs BP (!) 151/59   Pulse (!) 47   Temp 97.8 F (36.6 C) (Oral)   Resp 12   Ht 6' (1.829 m)   Wt 85.3 kg   SpO2 94%   BMI 25.50 kg/m   Physical Exam Vitals and nursing note reviewed.  Constitutional:      Appearance: Normal appearance.  Neck:     Comments: Patient had hard cervical collar during my exam Cardiovascular:     Rate and Rhythm: Regular rhythm. Bradycardia present.     Pulses: Normal pulses.  Pulmonary:     Effort: Pulmonary effort is normal.     Breath sounds: Normal breath sounds.  Chest:     Chest wall: No tenderness.  Abdominal:     General: There is no distension.     Palpations: Abdomen is soft.     Tenderness: There is no abdominal tenderness. There is no guarding.  Musculoskeletal:        General: No tenderness or signs of injury. Normal range of motion.     Cervical back: No tenderness.     Right lower leg: No edema.     Left lower leg: No edema.  Skin:    General: Skin is warm.     Capillary Refill: Capillary refill takes less than 2 seconds.  Neurological:     General: No focal deficit present.     Mental Status: He is alert and oriented to person, place, and time.     GCS: GCS eye subscore is 4. GCS verbal subscore is 5. GCS motor subscore is 6.     Cranial Nerves: No facial asymmetry.     Sensory: Sensation is intact. No sensory deficit.     Motor: Motor function is intact. No weakness.     Coordination: Coordination is intact. Coordination normal.     Comments: CN II through XII intact.  Speech is slow and slightly slurred but this is patient's neurologic baseline per his son who is at bedside.  Follows commands well and answers questions appropriately.  Alert and oriented x 3 here on my exam.     (all labs ordered are listed, but only abnormal results are displayed) Labs  Reviewed  COMPREHENSIVE METABOLIC PANEL WITH GFR - Abnormal; Notable for the following components:      Result Value   Sodium 134 (*)    Glucose, Bld 192 (*)    Creatinine, Ser 1.32 (*)    Calcium  8.8 (*)    Albumin  3.0 (*)    AST 117 (*)    ALT 80 (*)    Alkaline Phosphatase 149 (*)    Total  Bilirubin 1.3 (*)    GFR, Estimated 52 (*)    All other components within normal limits  CBC WITH DIFFERENTIAL/PLATELET - Abnormal; Notable for the following components:   RBC 3.75 (*)    Hemoglobin 12.0 (*)    HCT 36.3 (*)    Platelets 142 (*)    All other components within normal limits  URINALYSIS, ROUTINE W REFLEX MICROSCOPIC    EKG: None  Radiology: CT Head Wo Contrast Result Date: 07/18/2024 CLINICAL DATA:  Subarachnoid hemorrhage, follow-up examination EXAM: CT HEAD WITHOUT CONTRAST TECHNIQUE: Contiguous axial images were obtained from the base of the skull through the vertex without intravenous contrast. RADIATION DOSE REDUCTION: This exam was performed according to the departmental dose-optimization program which includes automated exposure control, adjustment of the mA and/or kV according to patient size and/or use of iterative reconstruction technique. COMPARISON:  3:23 p.m. FINDINGS: Brain: Small volume subarachnoid hemorrhage within the left sylvian fissure and more superior Rolandic sulcus appears stable since prior examination. No interval hemorrhage. Parenchymal volume loss is stable and commensurate with the patient's age. Right frontal cortical and right cerebellar encephalomalacia again noted. No abnormal mass effect or midline shift. No acute infarct. Ventricular size is normal. Vascular: Extensive vascular calcification with carotid siphons. No asymmetric hyperdense vasculature at the skull base Skull: Normal. Negative for fracture or focal lesion. Sinuses/Orbits: No acute finding. Other: Mastoid air cells and middle ear cavities are clear. Small right frontal scalp hematoma  IMPRESSION: 1. Stable small volume subarachnoid hemorrhage within the left Sylvian fissure and more superior Rolandic sulcus. No interval hemorrhage. 2. Stable senescent change and remote right frontal and right cerebellar infarcts. 3. Small right frontal scalp hematoma. Electronically Signed   By: Dorethia Molt M.D.   On: 07/18/2024 22:31   CT Cervical Spine Wo Contrast Result Date: 07/18/2024 CLINICAL DATA:  Neck trauma, fall, syncope. EXAM: CT CERVICAL SPINE WITHOUT CONTRAST TECHNIQUE: Multidetector CT imaging of the cervical spine was performed without intravenous contrast. Multiplanar CT image reconstructions were also generated. RADIATION DOSE REDUCTION: This exam was performed according to the departmental dose-optimization program which includes automated exposure control, adjustment of the mA and/or kV according to patient size and/or use of iterative reconstruction technique. COMPARISON:  None Available. FINDINGS: Despite efforts by the technologist and patient, motion artifact is present on today's exam and could not be eliminated. This reduces exam sensitivity and specificity. Alignment: Up to 4 mm anterolisthesis at C3-4 although admittedly there is extensive motion artifact in this region which might skew the appearance, and this subluxation is not readily apparent on the scout image. 3 mm degenerative anterolisthesis at C7-T1. Skull base and vertebrae: Substantial anterior spurring at C1-2. Fused facet joints at C3-4. Substantial pseudo articulating anterior osteophyte at C5-6. No definite fracture is identified. Motion artifact especially blurs the C4 and C5 levels. Soft tissues and spinal canal: Bilateral common carotid atheromatous vascular calcifications. Mild benign retro odontoid pseudotumor/pannus. Disc levels: Suspected right foraminal impingement at C4-5, C5-6, and C6-7 and suspected left foraminal impingement potentially at C3-4 and C6-7 due to uncinate and facet spurring. Upper chest:  Mild biapical pleuroparenchymal scarring Other: No supplemental non-categorized findings. IMPRESSION: 1. No definite fracture is identified. 2. Up to 4 mm anterolisthesis at C3-4 although admittedly there is extensive motion artifact in this region which might skew the appearance, and this subluxation is not readily apparent on the scout image. 3. 3 mm degenerative anterolisthesis at C7-T1. 4. Suspected right foraminal impingement at C4-5, C5-6, and C6-7 and suspected  left foraminal impingement potentially at C3-4 and C6-7 due to uncinate and facet spurring. 5. Bilateral common carotid atheromatous vascular calcifications. Electronically Signed   By: Ryan Salvage M.D.   On: 07/18/2024 15:57   CT Head Wo Contrast Result Date: 07/18/2024 EXAM: CT HEAD AND CERVICAL SPINE 07/18/2024 03:34:10 PM TECHNIQUE: CT of the head and cervical spine was performed without the administration of intravenous contrast. Multiplanar reformatted images are provided for review. Automated exposure control, iterative reconstruction, and/or weight based adjustment of the mA/kV was utilized to reduce the radiation dose to as low as reasonably achievable. COMPARISON: None available. CLINICAL HISTORY: Head trauma, minor (Age >= 65y). Pt BIBEMS status post syncope/fall. Normally A\T\Ox4, A\T\Ox2 on EMS arrival to pt. In c collar. On blood thinners. EMS reports baseline slurred speech. SB for duration of EMS trip. FINDINGS: CT HEAD BRAIN AND VENTRICLES: Acute subarachnoid hemorrhage is noted overlying the left frontoparietal lobe and extending into the posterior and inferior aspect of the sylvian fissure, image 17/2 and image 22/2. Right frontal lobe encephalomalacia compatible with remote cortical infarct. Hypoattenuating foci in the cerebral white matter, most likely representing chronic small vessel disease. Prominence of the sulci and ventricles compatible with brain atrophy. ORBITS: No acute abnormality. SINUSES AND MASTOIDS: No  acute abnormality. SOFT TISSUES AND SKULL: Small right scalp hematoma overlying the frontoparietal bone measures 5 mm in thickness, image 18/2. No acute skull fracture. CT CERVICAL SPINE BONES AND ALIGNMENT: No signs of acute fracture or subluxation. The vertebral body heights are well maintained. Anterolisthesis anteroposterior diameter of the spinal canal at the level of C3 on C4 measures 4 mm sagittal image 33. This is most likely due to chronic cervical spondylosis. No signs of acute posttraumatic malalignment of the cervical spine. DEGENERATIVE CHANGES: Moderate-to-severe cervical spondylosis with multilevel disc space narrowing and endplate spurring. Bilateral facet arthropathy. SOFT TISSUES: No prevertebral soft tissue swelling. VASCULATURE: Bilateral carotid artery calcifications. IMPRESSION: 1. Acute subarachnoid hemorrhage overlying the left frontoparietal lobe and extending into the posterior and inferior aspect of the sylvian fissure. 2. Right frontal lobe encephalomalacia compatible with remote cortical infarct. 3. Small right scalp hematoma overlying the frontoparietal bone, measuring 5 mm in thickness. No acute skull fracture. 4. Moderate-to-severe cervical spondylosis with multilevel disc space narrowing, endplate spurring, and bilateral facet arthropathy. No acute fracture or traumatic malalignment of the cervical spine. 5. Critical value was called to the ordering physician, Glendia Breeding at the time of interpretation on 07/18/2024 at 3:23 pm Electronically signed by: Waddell Calk MD 07/18/2024 03:57 PM EDT RP Workstation: [id number]     Procedures   Medications Ordered in the ED - No data to display                                  Medical Decision Making Patient here after fall earlier today fell forward and was believed to have struck his head on the floor.  Takes aspirin  and Plavix  daily. Patient brought in with c-collar applied, patient denies any pain at this time headache  dizziness nausea or vomiting.   Head injury with anticoagulation, skull fracture, subarachnoid hemorrhage, subdural hematoma, concussion all considered  Amount and/or Complexity of Data Reviewed Labs: ordered.    Details: Labs no leukocytosis, hemoglobin slightly low but at baseline.  Serum creatinine similar to 4 weeks ago urinalysis without evidence of infection Radiology: ordered.    Details: CT head shows acute subarachnoid hemorrhage  Repeat CT  head recommended by Dr. Malcolm shows stable small volume subarachnoid within the left sylvian fissure Discussion of management or test interpretation with external provider(s): Discussed CT findings with neurosurgery, Dr. Malcolm who recommends follow-up CT in 6 hours, if repeat CT stable can be discharged home with clinic follow-up  Patient has been resting comfortably here without complication. Continues to deny any symptoms.  C collar removed after review of imaging.  Repeat CT head shows stable small volume subarachnoid.  Discussed findings with family and patient.  They are agreeable to close outpatient follow-up with neurosurgery.  Given strict return precautions as well.  I recommended Tylenol  for pain and avoid NSAID use        Final diagnoses:  Traumatic subarachnoid hemorrhage with loss of consciousness of 30 minutes or less, initial encounter [facility])    ED Discharge Orders     None          Herlinda Milling, PA-C 07/22/24 1614    Suzette Pac, MD 07/24/24 1338

## 2024-07-22 NOTE — Progress Notes (Signed)
 [facility] Transitional Care Management     Situation   Gregory [name] is a 89 y.o. male who was contacted today for a transitional care outreach for ED encounter.  Admission Date:   Discharge Date:07/19/2024   Institution: [facility]  Diagnosis:  Fall  Is this visit eligible for TCM? No  Background   Since Discharge: HN phone call with pt's son. He reports he was actually on the phone with the office at that time. No further outreach warranted at this time by HN.  Primary Care Provider on Record: Elberta Ebb Cone, NP   Assessment    General Assessment     None           Recommendation    PCP/specialist notified: No  Referral Made: No  Referrals made to other disciplines: None   Future Appointments  Date Time Provider Department Center  09/16/2024  2:00 PM Elberta Ebb Cone, NP [facility] [address]      Augustin Saupe, RN, BSN Health Navigator [facility] [phone] ]   Electronically signed by: Augustin Saupe, RN 07/22/2024 10:33 AM

## 2024-07-23 ENCOUNTER — Encounter (HOSPITAL_COMMUNITY): Payer: Self-pay

## 2024-07-23 ENCOUNTER — Observation Stay (HOSPITAL_COMMUNITY)

## 2024-07-23 ENCOUNTER — Other Ambulatory Visit: Payer: Self-pay

## 2024-07-23 ENCOUNTER — Inpatient Hospital Stay (HOSPITAL_COMMUNITY)
Admission: EM | Admit: 2024-07-23 | Discharge: 2024-07-25 | DRG: 086 | Disposition: A | Discharge status: Home Health | Attending: Internal Medicine | Admitting: Internal Medicine

## 2024-07-23 ENCOUNTER — Emergency Department (HOSPITAL_COMMUNITY)

## 2024-07-23 DIAGNOSIS — R531 Weakness: Secondary | ICD-10-CM

## 2024-07-23 DIAGNOSIS — Z951 Presence of aortocoronary bypass graft: Secondary | ICD-10-CM

## 2024-07-23 DIAGNOSIS — Y92008 Other place in unspecified non-institutional (private) residence as the place of occurrence of the external cause: Secondary | ICD-10-CM

## 2024-07-23 DIAGNOSIS — R410 Disorientation, unspecified: Secondary | ICD-10-CM

## 2024-07-23 DIAGNOSIS — I25110 Atherosclerotic heart disease of native coronary artery with unstable angina pectoris: Secondary | ICD-10-CM | POA: Diagnosis present

## 2024-07-23 DIAGNOSIS — F3181 Bipolar II disorder: Secondary | ICD-10-CM | POA: Diagnosis present

## 2024-07-23 DIAGNOSIS — R7989 Other specified abnormal findings of blood chemistry: Secondary | ICD-10-CM | POA: Diagnosis present

## 2024-07-23 DIAGNOSIS — K760 Fatty (change of) liver, not elsewhere classified: Secondary | ICD-10-CM | POA: Diagnosis present

## 2024-07-23 DIAGNOSIS — Z7902 Long term (current) use of antithrombotics/antiplatelets: Secondary | ICD-10-CM

## 2024-07-23 DIAGNOSIS — R5381 Other malaise: Secondary | ICD-10-CM | POA: Diagnosis present

## 2024-07-23 DIAGNOSIS — E785 Hyperlipidemia, unspecified: Secondary | ICD-10-CM | POA: Diagnosis present

## 2024-07-23 DIAGNOSIS — S065X0A Traumatic subdural hemorrhage without loss of consciousness, initial encounter: Principal | ICD-10-CM | POA: Diagnosis present

## 2024-07-23 DIAGNOSIS — S065XAA Traumatic subdural hemorrhage with loss of consciousness status unknown, initial encounter: Principal | ICD-10-CM

## 2024-07-23 DIAGNOSIS — Z923 Personal history of irradiation: Secondary | ICD-10-CM

## 2024-07-23 DIAGNOSIS — Z79899 Other long term (current) drug therapy: Secondary | ICD-10-CM

## 2024-07-23 DIAGNOSIS — Z7982 Long term (current) use of aspirin: Secondary | ICD-10-CM

## 2024-07-23 DIAGNOSIS — Z8673 Personal history of transient ischemic attack (TIA), and cerebral infarction without residual deficits: Secondary | ICD-10-CM

## 2024-07-23 DIAGNOSIS — C3411 Malignant neoplasm of upper lobe, right bronchus or lung: Secondary | ICD-10-CM | POA: Diagnosis present

## 2024-07-23 DIAGNOSIS — E039 Hypothyroidism, unspecified: Secondary | ICD-10-CM | POA: Diagnosis present

## 2024-07-23 DIAGNOSIS — N400 Enlarged prostate without lower urinary tract symptoms: Secondary | ICD-10-CM | POA: Diagnosis present

## 2024-07-23 DIAGNOSIS — F419 Anxiety disorder, unspecified: Secondary | ICD-10-CM | POA: Diagnosis present

## 2024-07-23 DIAGNOSIS — I252 Old myocardial infarction: Secondary | ICD-10-CM

## 2024-07-23 DIAGNOSIS — Z87891 Personal history of nicotine dependence: Secondary | ICD-10-CM

## 2024-07-23 DIAGNOSIS — I130 Hypertensive heart and chronic kidney disease with heart failure and stage 1 through stage 4 chronic kidney disease, or unspecified chronic kidney disease: Secondary | ICD-10-CM | POA: Diagnosis present

## 2024-07-23 DIAGNOSIS — Z803 Family history of malignant neoplasm of breast: Secondary | ICD-10-CM

## 2024-07-23 DIAGNOSIS — I440 Atrioventricular block, first degree: Secondary | ICD-10-CM | POA: Diagnosis present

## 2024-07-23 DIAGNOSIS — I69320 Aphasia following cerebral infarction: Secondary | ICD-10-CM

## 2024-07-23 DIAGNOSIS — R8290 Unspecified abnormal findings in urine: Secondary | ICD-10-CM | POA: Diagnosis present

## 2024-07-23 DIAGNOSIS — N1831 Chronic kidney disease, stage 3a: Secondary | ICD-10-CM | POA: Diagnosis present

## 2024-07-23 DIAGNOSIS — W1839XA Other fall on same level, initial encounter: Secondary | ICD-10-CM | POA: Diagnosis present

## 2024-07-23 DIAGNOSIS — Z8249 Family history of ischemic heart disease and other diseases of the circulatory system: Secondary | ICD-10-CM

## 2024-07-23 DIAGNOSIS — R001 Bradycardia, unspecified: Secondary | ICD-10-CM | POA: Diagnosis present

## 2024-07-23 DIAGNOSIS — I5032 Chronic diastolic (congestive) heart failure: Secondary | ICD-10-CM | POA: Diagnosis present

## 2024-07-23 LAB — CBC
HCT: 40.7 % (ref 39.0–52.0)
Hemoglobin: 12.8 g/dL — ABNORMAL LOW (ref 13.0–17.0)
MCH: 30.8 pg (ref 26.0–34.0)
MCHC: 31.4 g/dL (ref 30.0–36.0)
MCV: 98.1 fL (ref 80.0–100.0)
Platelets: 159 K/uL (ref 150–400)
RBC: 4.15 MIL/uL — ABNORMAL LOW (ref 4.22–5.81)
RDW: 13.9 % (ref 11.5–15.5)
WBC: 6.6 K/uL (ref 4.0–10.5)
nRBC: 0 % (ref 0.0–0.2)

## 2024-07-23 LAB — URINALYSIS, ROUTINE W REFLEX MICROSCOPIC
Bilirubin Urine: NEGATIVE
Glucose, UA: NEGATIVE mg/dL
Hgb urine dipstick: NEGATIVE
Ketones, ur: NEGATIVE mg/dL
Nitrite: NEGATIVE
Protein, ur: NEGATIVE mg/dL
Specific Gravity, Urine: 1.036 — ABNORMAL HIGH (ref 1.005–1.030)
pH: 5 (ref 5.0–8.0)

## 2024-07-23 LAB — PROTIME-INR
INR: 1.2 (ref 0.8–1.2)
Prothrombin Time: 15.5 s — ABNORMAL HIGH (ref 11.4–15.2)

## 2024-07-23 LAB — COMPREHENSIVE METABOLIC PANEL WITH GFR
ALT: 64 U/L — ABNORMAL HIGH (ref 0–44)
AST: 99 U/L — ABNORMAL HIGH (ref 15–41)
Albumin: 3 g/dL — ABNORMAL LOW (ref 3.5–5.0)
Alkaline Phosphatase: 156 U/L — ABNORMAL HIGH (ref 38–126)
Anion gap: 7 (ref 5–15)
BUN: 17 mg/dL (ref 8–23)
CO2: 24 mmol/L (ref 22–32)
Calcium: 9.2 mg/dL (ref 8.9–10.3)
Chloride: 106 mmol/L (ref 98–111)
Creatinine, Ser: 1.28 mg/dL — ABNORMAL HIGH (ref 0.61–1.24)
GFR, Estimated: 54 mL/min — ABNORMAL LOW (ref >60)
Glucose, Bld: 126 mg/dL — ABNORMAL HIGH (ref 70–99)
Potassium: 4.3 mmol/L (ref 3.5–5.1)
Sodium: 137 mmol/L (ref 135–145)
Total Bilirubin: 0.8 mg/dL (ref 0.0–1.2)
Total Protein: 7.1 g/dL (ref 6.5–8.1)

## 2024-07-23 LAB — APTT: aPTT: 42 s — ABNORMAL HIGH (ref 24–36)

## 2024-07-23 LAB — AMMONIA: Ammonia: 43 umol/L — ABNORMAL HIGH (ref 9–35)

## 2024-07-23 MED ORDER — SENNOSIDES-DOCUSATE SODIUM 8.6-50 MG PO TABS
1.0000 | ORAL_TABLET | Freq: Every evening | ORAL | Status: DC | PRN
Start: 2024-07-23 — End: 2024-07-25

## 2024-07-23 MED ORDER — ONDANSETRON HCL 4 MG PO TABS: 4.0000 mg | ORAL_TABLET | Freq: Four times a day (QID) | ORAL | Status: DC | PRN

## 2024-07-23 MED ORDER — BISACODYL 5 MG PO TBEC: 5.0000 mg | DELAYED_RELEASE_TABLET | Freq: Every day | ORAL | Status: DC | PRN

## 2024-07-23 MED ORDER — ALPRAZOLAM 0.25 MG PO TABS
1.0000 mg | ORAL_TABLET | Freq: Every day | ORAL | Status: DC
  Administered 2024-07-23: 0.25 mg via ORAL
  Administered 2024-07-24: 1 mg via ORAL
  Filled 2024-07-23 (×2): qty 4

## 2024-07-23 MED ORDER — SODIUM CHLORIDE 0.9 % IV SOLN
1.0000 g | Freq: Once | INTRAVENOUS | Status: AC
  Administered 2024-07-23: 1 g via INTRAVENOUS
  Filled 2024-07-23: qty 10

## 2024-07-23 MED ORDER — SERTRALINE HCL 50 MG PO TABS
150.0000 mg | ORAL_TABLET | Freq: Every day | ORAL | Status: DC
  Administered 2024-07-24 – 2024-07-25 (×2): 150 mg via ORAL
  Filled 2024-07-23 (×2): qty 1

## 2024-07-23 MED ORDER — ONDANSETRON HCL 4 MG/2ML IJ SOLN: 4.0000 mg | Freq: Four times a day (QID) | INTRAMUSCULAR | Status: DC | PRN

## 2024-07-23 MED ORDER — LEVOTHYROXINE SODIUM 25 MCG PO TABS
25.0000 ug | ORAL_TABLET | Freq: Every day | ORAL | Status: DC
  Administered 2024-07-24 – 2024-07-25 (×2): 25 ug via ORAL
  Filled 2024-07-23 (×2): qty 1

## 2024-07-23 MED ORDER — SODIUM CHLORIDE 0.9% FLUSH
3.0000 mL | Freq: Two times a day (BID) | INTRAVENOUS | Status: DC
  Administered 2024-07-23 – 2024-07-25 (×4): 3 mL via INTRAVENOUS

## 2024-07-23 MED ORDER — MIRTAZAPINE 15 MG PO TABS
7.5000 mg | ORAL_TABLET | Freq: Every day | ORAL | Status: DC
Start: 2024-07-23 — End: 2024-07-25
  Administered 2024-07-23 – 2024-07-24 (×2): 7.5 mg via ORAL
  Filled 2024-07-23 (×2): qty 1

## 2024-07-23 MED ORDER — LACTATED RINGERS IV BOLUS
1000.0000 mL | Freq: Once | INTRAVENOUS | Status: AC
  Administered 2024-07-23: 1000 mL via INTRAVENOUS

## 2024-07-23 MED ORDER — TAMSULOSIN HCL 0.4 MG PO CAPS
0.4000 mg | ORAL_CAPSULE | Freq: Every day | ORAL | Status: DC
  Administered 2024-07-23 – 2024-07-24 (×2): 0.4 mg via ORAL
  Filled 2024-07-23 (×2): qty 1

## 2024-07-23 MED ORDER — IOHEXOL 350 MG/ML SOLN
75.0000 mL | Freq: Once | INTRAVENOUS | Status: AC | PRN
  Administered 2024-07-23: 75 mL via INTRAVENOUS

## 2024-07-23 MED ORDER — ISOSORBIDE MONONITRATE ER 30 MG PO TB24
15.0000 mg | ORAL_TABLET | Freq: Every day | ORAL | Status: DC
  Administered 2024-07-24: 15 mg via ORAL
  Filled 2024-07-23: qty 1

## 2024-07-23 NOTE — ED Provider Notes (Signed)
 [place] EMERGENCY DEPARTMENT AT [facility] Provider Note   CSN: [id number] Arrival date & time: 07/23/24  1325     Patient presents with: No chief complaint on file.   Gregory [name] is a 89 y.o. male with a past medical history of CVA/TIA, HTN, CKD, CHF, CAD, TIA chronic AC (ASA, Plavix  daily) presents to emergency department for evaluation of continued worsening balance, increased sleeping, confusion since having 2 falls over the past week.  Was seen at [facility] on 07/18/2024 and diagnosed with small volume SAH within left sylvian fissure following head injury following standing up and striking his head on the floor.  Patient lives with son and his wife. They deny additional falls since being evaluated at [facility] ED.  Today sought ED evaluation as, Family endorses concern about now having to use walker consistently, worsening balance, confusion with daily, month.  At baseline, has slow speech and occasional word finding difficulty from previous TIA but reports that he has been increasingly confused over the past week.    HPI     Prior to Admission medications   Medication Sig Start Date End Date Taking? Authorizing Provider  ALPRAZolam  (XANAX ) 0.5 MG tablet Take 0.5-1 mg by mouth in the morning and at bedtime. Take 0.5 mg in the morning and 1 mg at bedtime 03/17/17   [provider]  amiodarone  (PACERONE ) 200 MG tablet Take 1 tablet (200 mg total) by mouth daily. Patient taking differently: Take 200 mg by mouth at bedtime. 08/01/23   Duke, Jon Garre, PA  amLODipine  (NORVASC ) 5 MG tablet Take 5 mg by mouth daily. 07/28/23   [provider]  aspirin  EC 81 MG tablet Take 81 mg by mouth daily. Swallow whole.    [provider]  atorvastatin  (LIPITOR ) 80 MG tablet Take 1 tablet (80 mg total) by mouth daily. 12/27/22   Meng, Hao, PA  clopidogrel  (PLAVIX ) 75 MG tablet Take 75 mg by mouth daily. 05/31/23   [provider]  ferrous sulfate  325 (65 FE)  MG EC tablet Take 1 tablet (325 mg total) by mouth daily with breakfast. 05/28/23 05/22/24  Dwan Kyla HERO, PA-C  isosorbide  mononitrate (IMDUR ) 30 MG 24 hr tablet TAKE 1/2 OF A TABLET (15 MG TOTAL) BY MOUTH DAILY Patient taking differently: Take 15 mg by mouth daily. 12/21/23   Lavona Agent, MD  metoprolol  tartrate (LOPRESSOR ) 25 MG tablet TAKE 1 TABLET BY MOUTH TWICE A DAY 04/05/24   Lavona Agent, MD  mirtazapine  (REMERON ) 7.5 MG tablet TAKE 1 TABLET(7.5 MG) BY MOUTH AT BEDTIME 07/17/24   Cottle, Lorene KANDICE Raddle., MD  Multiple Vitamins-Minerals (CENTRUM ADULTS PO) Take 1 tablet by mouth daily.     [provider]  Multiple Vitamins-Minerals (ZINC PO) Take 1 capsule by mouth daily at 12 noon.    [provider]  nitroGLYCERIN  (NITROSTAT ) 0.4 MG SL tablet Place 0.4 mg under the tongue as needed for chest pain.    [provider]  Omega-3 Fatty Acids (FISH OIL PO) Take 1 capsule by mouth daily.    [provider]  omeprazole  (PRILOSEC) 20 MG capsule Take 1 capsule (20 mg total) by mouth every other day. 05/29/24   Legrand Victory LITTIE DOUGLAS, MD  sertraline  (ZOLOFT ) 50 MG tablet TAKE 3 TABLETS(150 MG) BY MOUTH DAILY 07/17/24   Cottle, Lorene KANDICE Raddle., MD  tamsulosin  (FLOMAX ) 0.4 MG CAPS capsule Take 0.4 mg by mouth at bedtime. 10/24/23   [provider]  Allergies: Patient has no known allergies.    Review of Systems  Psychiatric/Behavioral:  Positive for confusion.     Updated Vital Signs BP (!) 167/84   Pulse (!) 43   Temp 98.1 F (36.7 C)   Resp (!) 9   Ht 6' (1.829 m)   Wt 85.3 kg   SpO2 97%   BMI 25.50 kg/m   Physical Exam Vitals and nursing note reviewed.  Constitutional:      General: He is not in acute distress.    Appearance: Normal appearance. He is not diaphoretic.  HENT:     Head: Normocephalic and atraumatic.      Comments: No TTP of cranium No crepitus to facial bones    Right Ear: External ear normal. No hemotympanum.     Left  Ear: External ear normal. No hemotympanum.     Nose: Nose normal.     Right Nostril: No epistaxis or septal hematoma.     Left Nostril: No epistaxis or septal hematoma.     Mouth/Throat:     Mouth: Mucous membranes are moist. No injury or lacerations.  Eyes:     General:        Right eye: No discharge.        Left eye: No discharge.     Extraocular Movements: Extraocular movements intact.     Conjunctiva/sclera: Conjunctivae normal.     Pupils: Pupils are equal, round, and reactive to light.     Comments: No subconjunctival hemorrhage, hyphema, tear drop pupil, or fluid leakage bilaterally  Neck:     Vascular: No carotid bruit.  Cardiovascular:     Rate and Rhythm: Bradycardia present.     Pulses: Normal pulses.          Radial pulses are 2+ on the right side and 2+ on the left side.       Dorsalis pedis pulses are 2+ on the right side and 2+ on the left side.  Pulmonary:     Effort: Pulmonary effort is normal. No respiratory distress.     Breath sounds: Normal breath sounds. No wheezing.  Chest:     Chest wall: No tenderness.  Abdominal:     General: Bowel sounds are normal. There is no distension.     Palpations: Abdomen is soft.     Tenderness: There is no abdominal tenderness. There is no guarding or rebound.  Musculoskeletal:     Cervical back: Full passive range of motion without pain, normal range of motion and neck supple. No deformity, rigidity or bony tenderness. Normal range of motion.     Thoracic back: No deformity or bony tenderness. Normal range of motion.     Lumbar back: No deformity or bony tenderness. Normal range of motion.     Right hip: No bony tenderness or crepitus.     Left hip: No bony tenderness or crepitus.     Right lower leg: No edema.     Left lower leg: No edema.     Comments: No obvious deformity to joints or long bones Pelvis stable with no shortening or rotation of LE bilaterally  Skin:    General: Skin is warm and dry.     Capillary  Refill: Capillary refill takes less than 2 seconds.  Neurological:     General: No focal deficit present.     Mental Status: He is alert and oriented to person, place, and time. Mental status is at baseline.     GCS: GCS  eye subscore is 4. GCS verbal subscore is 5. GCS motor subscore is 6.     Cranial Nerves: Cranial nerves 2-12 are intact. No cranial nerve deficit.     Sensory: Sensation is intact. No sensory deficit.     Motor: Motor function is intact. No weakness or tremor.     Coordination: Coordination is intact. Coordination normal. Finger-Nose-Finger Test and Heel to [facility] Test normal.     Gait: Gait is intact. Gait normal.     Deep Tendon Reflexes: Reflexes are normal and symmetric. Reflexes normal.     Comments: Slow speech and mild expressive aphasia per baseline per family. A&Ox2 with confusion to time. following commands appropriately. Grip strength equal.  Motor 5/5 and sensation 2/2 BUE and BLE     (all labs ordered are listed, but only abnormal results are displayed) Labs Reviewed  COMPREHENSIVE METABOLIC PANEL WITH GFR - Abnormal; Notable for the following components:      Result Value   Glucose, Bld 126 (*)    Creatinine, Ser 1.28 (*)    Albumin  3.0 (*)    AST 99 (*)    ALT 64 (*)    Alkaline Phosphatase 156 (*)    GFR, Estimated 54 (*)    All other components within normal limits  CBC - Abnormal; Notable for the following components:   RBC 4.15 (*)    Hemoglobin 12.8 (*)    All other components within normal limits  URINALYSIS, ROUTINE W REFLEX MICROSCOPIC  PROTIME-INR  APTT  AMMONIA    EKG: EKG Interpretation Date/Time:  Tuesday July 23 2024 17:39:02 EDT Ventricular Rate:  44 PR Interval:  294 QRS Duration:  106 QT Interval:  539 QTC Calculation: 462 R Axis:   47  Text Interpretation: Sinus bradycardia Prolonged PR interval RSR' in V1 or V2, right VCD or RVH Repol abnrm suggests ischemia, lateral leads No significant change since last tracing  Confirmed by Randol Simmonds [phone]) on 07/23/2024 6:03:06 PM  Radiology: CT ANGIO HEAD NECK W WO CM Result Date: 07/23/2024 CLINICAL DATA:  Provided history: Vertigo, central. EXAM: CT ANGIOGRAPHY HEAD AND NECK WITH AND WITHOUT CONTRAST TECHNIQUE: Multidetector CT imaging of the head and neck was performed using the standard protocol during bolus administration of intravenous contrast. Multiplanar CT image reconstructions and MIPs were obtained to evaluate the vascular anatomy. Carotid stenosis measurements (when applicable) are obtained utilizing NASCET criteria, using the distal internal carotid diameter as the denominator. RADIATION DOSE REDUCTION: This exam was performed according to the departmental dose-optimization program which includes automated exposure control, adjustment of the mA and/or kV according to patient size and/or use of iterative reconstruction technique. CONTRAST:  75mL OMNIPAQUE  IOHEXOL  350 MG/ML SOLN COMPARISON:  Head CT 07/18/2024. CT angiogram head/neck 01/24/2018. Chest CT 06/19/2024. FINDINGS: CT HEAD FINDINGS Brain: Generalized cerebral atrophy. Subarachnoid hemorrhage previously demonstrated within the left sylvian hip fissure and along the left frontoparietal convexity is no longer present. However new from the prior head CT of 07/18/2024, there are subdural hematomas overlying the bilateral cerebral convexities (measuring up to 8 mm in thickness on the left and 4 mm in thickness on the right). No midline shift. Known chronic cortical/subcortical right MCA territory infarcts within the frontal and parietal lobes. Redemonstrated small chronic cortical/subcortical infarct at the left temporoparietal junction (MCA territory). Background mild patchy and ill-defined hypoattenuation within the cerebral white matter, nonspecific but compatible with chronic small vessel ischemic disease. No demarcated cortical infarct. No evidence of an intracranial mass. No midline shift.  Vascular: No  hyperdense vessel.  Atherosclerotic calcifications. Skull: No calvarial fracture or aggressive osseous lesion. Sinuses/Orbits: No mass or acute finding within the imaged orbits. Minimal mucosal thickening within the bilateral maxillary sinuses. Review of the MIP images confirms the above findings CTA NECK FINDINGS Aortic arch: Standard aortic branching. Atherosclerotic plaque within the aortic arch and proximal major branch vessels of the neck. Streak/beam hardening artifact arising from a dense contrast bolus partially obscures the left subclavian artery. Within this limitation, there is no appreciable hemodynamically significant innominate or proximal subclavian artery stenosis. Right carotid system: Prior right carotid endarterectomy. CCA and ICA patent within the neck without stenosis. Atherosclerotic plaque at the CCA origin, within the distal CCA, but the carotid bifurcation and within the proximal ICA. Left carotid system: CCA and ICA patent within the neck. Atherosclerotic plaque scattered within the CCA, but the carotid bifurcation and scattered within the ICA. Most notably, predominantly calcified atherosclerotic plaque at the ICA origin results in 65-70% stenosis (unchanged from the prior CTA of 01/24/2018). Vertebral arteries: Codominant and patent within the neck. Atherosclerotic plaque within both vessels. Most notably, progressive sclerotic plaque within the right vertebral artery V3/V4 junction results in up to moderate stenosis. Skeleton: C3-C4 vertebral ankylosis. Cervical spondylosis. Disc space narrowing is advanced at C5-C6 and C6-C7. Grade 1 anterolisthesis at C7-T1, T1-T2 and T2-T3. Dextrocurvature of the upper/mid thoracic spine, partially imaged. Other neck: No neck mass or cervical lymphadenopathy. Upper chest: Bandlike opacity within the right upper lobe, similar to the recent prior chest CT of 06/19/2024. Prior median sternotomy. Review of the MIP images confirms the above findings CTA  HEAD FINDINGS Anterior circulation: The intracranial internal carotid arteries are patent. Calcified atherosclerotic plaque within both vessels resulting in no more than mild stenosis. The M1 middle cerebral arteries are patent. No M2 proximal branch occlusion or high-grade proximal stenosis. The anterior cerebral arteries are patent. No intracranial aneurysm is identified. Posterior circulation: The intracranial vertebral arteries are patent. Atherosclerotic plaque within both vessels. Most notably, progressive plaque within the right vertebral artery at the V3/V4 junction results in up to moderate stenosis. The basilar artery is patent. The posterior cerebral arteries are patent. Hypoplastic left P1 segment with sizable left posterior communicating artery. The right posterior communicating artery is diminutive or absent. Venous sinuses: Within the limitations of contrast timing, no convincing thrombus. Anatomic variants: As described. Review of the MIP images confirms the above findings CT head impression #1 called by telephone at the time of interpretation on 07/23/2024 at 4:52 pm to provider [facility] , who verbally acknowledged these results. IMPRESSION: CT HEAD: 1. Subdural hematomas overlying the bilateral cerebral convexities (measuring up to 8 mm in thickness on the left and 4 mm in thickness on the right), new from the head CT of 07/18/2024. No midline shift. 2. Subarachnoid hemorrhage previously demonstrated within the left Sylvian fissure, and along the left frontoparietal convexity, is no longer present. 3. Background parenchymal atrophy, chronic small vessel ischemic disease and chronic bilateral MCA territory infarcts, as described. CTA NECK: 1. The common carotid and internal carotid arteries are patent within neck. Atherosclerotic plaque bilaterally, as described. Most notably, predominantly calcified plaque within the proximal left ICA results in 65-70% stenosis (unchanged from the prior CTA of  01/24/2018). Prior right carotid endarterectomy. 2. Vertebral arteries patent within the neck. Progressive atherosclerotic plaque at the right vertebral artery V3/V4 junction results in up to moderate stenosis. 3. Aortic Atherosclerosis (ICD10-I70.0). 4. Bandlike opacity within the right lung apex, similar to  the recent prior chest CT of 06/19/2024. Please refer to this prior examination for further description and for recommendations. 5. Aortic Atherosclerosis (ICD10-I70.0). CTA HEAD: 1. No proximal intracranial large vessel occlusion is identified. 2. Intracranial atherosclerotic disease as described. Most notably, progressive plaque within the right vertebral artery at the V3/V4 junction results in up to moderate stenosis. Electronically Signed   By: Rockey Childs D.O.   On: 07/23/2024 16:53    Medications Ordered in the ED  iohexol  (OMNIPAQUE ) 350 MG/ML injection 75 mL (75 mLs Intravenous Contrast Given 07/23/24 1606)    Clinical Course as of 07/23/24 1814  Tue Jul 23, 2024  1651 Subarachnoid results. New -- b/l sub sural 8mm R, 4mm no midline likey acute from 31st. [JB]    Clinical Course User Index [JB] Barrett, Warren SAILOR, PA-C                                 Medical Decision Making Amount and/or Complexity of Data Reviewed Labs: ordered.  Risk Decision regarding hospitalization.   Patient presents to the ED for concern of continued confusion, generalized weakness, this involves an extensive number of treatment options, and is a complaint that carries with it a high risk of complications and morbidity.  The differential diagnosis includes ICH, electrolyte abnormality, symptomatic anemia, hypoglycemia, AKI, UTI, CVA/TIA   Co morbidities that complicate the patient evaluation  On ASA, Plavix  History of TIA, CVA   Additional history obtained:  Additional history obtained from [facility], Nursing, and Outside Medical Records   External records from outside source obtained and reviewed  including triage RN note, family at bedside, ED note from 07/18/2024   Lab Tests:  I Ordered, and personally interpreted labs.  The pertinent results include:   CBG 126 Creatinine 1.28 AST 99 ALT 64 ALP 156   Imaging Studies ordered:  I ordered imaging studies including CTA  I independently visualized and interpreted imaging which showed  Subdural hematomas overlying the bilateral cerebral convexities (measuring up to 8 mm in thickness on the left and 4 mm in thickness on the right), new from the head CT of 07/18/2024. No midline shift. Subarachnoid hemorrhage previously demonstrated within the left Sylvian fissure, and along the left frontoparietal convexity, is no longer present. Background parenchymal atrophy, chronic small vessel ischemic disease and chronic bilateral MCA territory infarcts  The common carotid and internal carotid arteries are patent within neck. Atherosclerotic plaque bilaterally, as described. Most notably, predominantly calcified plaque within the proximal left ICA results in 65-70% stenosis (unchanged from the prior CTA of 01/24/2018). Prior right carotid endarterectomy.  Vertebral arteries patent within the neck. Progressive atherosclerotic plaque at the right vertebral artery V3/V4 junction results in up to moderate stenosis. Aortic Atherosclerosis (ICD10-I70.0).  Bandlike opacity within the right lung apex, similar to the recent prior chest CT of 06/19/2024 I agree with the radiologist interpretation   Cardiac Monitoring:  The patient was maintained on a cardiac monitor.  I personally viewed and interpreted the cardiac monitored which showed an underlying rhythm of: sinus bradycardia similar to previous EKGs   Medicines ordered and prescription drug management:  I ordered medication including rocephin   for suspected UTI  Reevaluation of the patient after these medicines showed that the patient improved I have reviewed the patients home medicines and have  made adjustments as needed    Consultations Obtained:  I requested consultation with neurosurgery [place],  and discussed lab and  imaging findings as well as pertinent plan - they recommend:  Will come evaluate patient CT tomorrow morning PT INR Hold asa, plavis Does not believe subdural is causing confusion   Problem List / ED Course:  Subdural hematoma Hx of Fall 2 falls previously and had SAH noted on 07/18/24. Repeat CT did not show worsening SAH so patient was discharged per neurosurgery recommendation Patient has no complaints.  Patient and family deny fall since being evaluated at [facility]. No new signs of trauma Labwork unrevealing for generalized weakness, confusion. Will r/o UTI as cause Consulted neurosurgery and plan to admit patient and obtain CT tomorrow morning Confusion History of transaminitis so obtained ammonia.  Ammonia mildly elevated at 43 UA shows concentrated urine with large leuks, WBC, rare bacteria.  Did provide 1 dose of Rocephin  here Emergency Department for possible UTI and have culture pending. Last LVEF WNL so also provided 1L LR for hydration Denies visual disturbances, dizziness. Motor and sensation intact. Grip strength equal, no pronator drift. No slurred speech. Mild expressive aphasia per baseline. Low suspicion for acute CVA, TIA   Reevaluation:  After the interventions noted above, I reevaluated the patient and found that they have :stayed the same   Social Determinants of Health:  Former tobacco abuse   Dispostion:  After consideration of the diagnostic results and the patients response to treatment, I feel that the patent would benefit from admission for repeat   Final diagnoses:  Subdural hematoma (HCC)  Confusion  Generalized weakness    ED Discharge Orders     None        Minnie Tinnie BRAVO, PA 07/23/24 2105    Randol Simmonds, MD 07/24/24 1114

## 2024-07-23 NOTE — ED Triage Notes (Signed)
 PT arrives via POV with his Son. PT was recently seen and treated for a traumatic subarachnoid hemorrhage on July 31st. Patient's son states over the past few days, his weakness has worsened as well as his confusion. Pt is AxOx3

## 2024-07-23 NOTE — ED Provider Triage Note (Signed)
 Emergency Medicine Provider Triage Evaluation Note  Gregory [name] , a 89 y.o. male  was evaluated in triage.  Pt complains of an 10 days of dizziness and trouble walking.  According to patient's son he has been falling to 1 side when walking for about 10 days.  Of note he had a fall on the 31st and was evaluated at [facility] for this and had normal CT scan.  Patient's family notes that they were sent here for PET SCAN. And he is concerned that he had a stroke. This all started 10 days ago.   Review of Systems  Positive: Vertigo sensation Negative: Focal weakness   Physical Exam  BP (!) 146/69   Pulse (!) 47   Temp 98.1 F (36.7 C)   Resp 17   Ht 6' (1.829 m)   Wt 85.3 kg   SpO2 95%   BMI 25.50 kg/m  Gen:   Awake, no distress   Resp:  Normal effort  MSK:   Moves extremities without difficulty  Other:  Able to hold bilateral lower extremities up against gravity for 3 to 4 seconds.  Sensation of lower extremity intact.  Upper extremity strength and grip strength equal bilaterally.  Sensation intact.  No facial droop.  Has slow speech which family member reports this is baseline.  Medical Decision Making  Medically screening exam initiated at 2:41 PM.  Appropriate orders placed.  Gregory [name] was informed that the remainder of the evaluation will be completed by another provider, this initial triage assessment does not replace that evaluation, and the importance of remaining in the ED until their evaluation is complete.     Gregory Warren SAILOR, PA-C 07/23/24 1443

## 2024-07-23 NOTE — ED Notes (Signed)
 Patient transported to Ultrasound

## 2024-07-23 NOTE — Consult Note (Signed)
 Neurosurgery Consult Note  Assessment:  89 y/o M w/ hx CAD on aspirin /plavix , stroke who presents with confusion, GLF, and CT showing new small b/l chronic SDH. Despite their progression compared to 7/31, I do not believe they are the etiology for his confusion   Plan:  SBP<140 Repeat CT head in AM Activity as tolerated Diet as tolerated No surgical intervention planned Hold all anticoagulation and antiplatelets Q4 neuro checks    CC: falls  HPI:     Patient is a 89 y.o. male w/ hx stroke, CAD on aspirin /plavix . Patient was seen 6 days ago for fall and was found to have small tSAH. Dr. Malcolm recommended 6 hour repeat CT which was stable after which he was discharged home with plans for outpatient follow-up. Pt has felt dizzy and had another GLF. CT today shows b/l chronic SDH (left 8 mm, right 4 mm) without any significant mass effect. Previous tSAH resolved. Currently, the patient is confused compared to his baseline. Of note, he has baseline mild aphasia from a stroke 2015. He denies focal motor/sensory deficit, neck pain, headache   Patient Active Problem List   Diagnosis Date Noted   Malignant neoplasm of right upper lobe of lung (HCC) 06/24/2024   Esophageal dysphagia 05/27/2024   Abnormal CT of the chest 05/27/2024   COVID-19 virus infection 04/24/2024   Acute hypoxic respiratory failure (HCC) 04/24/2024   Acute metabolic encephalopathy 04/24/2024   Acute hyponatremia 04/24/2024   Generalized weakness 04/24/2024   CKD stage 3b, GFR 30-44 ml/min (HCC) 04/24/2024   GAD (generalized anxiety disorder) 04/24/2024   BPH (benign prostatic hyperplasia) 04/24/2024   Chronic diastolic CHF (congestive heart failure) (HCC) 04/24/2024   History of anemia due to chronic kidney disease 04/24/2024   Hypoxia 04/23/2024   PVC's (premature ventricular contractions) 11/06/2023   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   Hyperlipidemia 02/28/2023   Stage 3a chronic kidney disease (CKD) (HCC) 12/30/2022    Hyperglycemia 12/30/2022   Lung nodule 12/30/2022   Coronary artery disease involving native coronary artery of native heart with unstable angina pectoris (HCC) 12/30/2022   AAA (abdominal aortic aneurysm) without rupture (HCC) 12/30/2022   Carotid artery disease (HCC) 12/30/2022   Non-ST elevation (NSTEMI) myocardial infarction (HCC) 12/29/2022   AKI (acute kidney injury) (HCC) 12/25/2022   Essential hypertension 12/25/2022   History of CVA (cerebrovascular accident) 12/25/2022   Angina pectoris (HCC) 12/24/2022   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 01/02/2018   Tremor 06/14/2017   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/17/2013   Past Medical History:  Diagnosis Date   Abdominal aneurysm (HCC)    Anginal pain (HCC)    Coronary artery disease    Depression    Heart disease    History of kidney stones    Hyperlipidemia    Hypertension    Myocardial infarction (HCC) 04/19/2023   NSTEMI   Stroke [facility]) 2004?   TIA    Past Surgical History:  Procedure Laterality Date   COLONOSCOPY     CORONARY ANGIOGRAPHY N/A 12/29/2022   Procedure: CORONARY ANGIOGRAPHY;  Surgeon: Court Dorn PARAS, MD;  Location: [facility];  Service: Cardiovascular;  Laterality: N/A;   CORONARY ARTERY BYPASS GRAFT N/A 05/22/2023   Procedure: CORONARY ARTERY BYPASS GRAFTING (CABG) X 2 WITH LEFT INTERNAL MAMMARY ARTERY HARVEST AND GREATER SAPHENOUS VEIN HARVESTED ENDOSCOPICALLY;  Surgeon: Kerrin Elspeth BROCKS, MD;  Location: [facility] OR;  Service: Open Heart Surgery;  Laterality: N/A;   ESOPHAGOGASTRODUODENOSCOPY (EGD) WITH PROPOFOL  N/A 05/27/2024  Procedure: ESOPHAGOGASTRODUODENOSCOPY (EGD) WITH PROPOFOL ;  Surgeon: Legrand Victory LITTIE DOUGLAS, MD;  Location: [facility];  Service: Gastroenterology;  Laterality: N/A;   EYE SURGERY     cataract right   IR ANGIO INTRA EXTRACRAN SEL COM CAROTID INNOMINATE BILAT MOD SED  02/28/2018   IR ANGIO VERTEBRAL SEL SUBCLAVIAN INNOMINATE UNI R MOD SED  02/28/2018   IR  RADIOLOGIST EVAL & MGMT  02/12/2021   LEFT HEART CATH AND CORONARY ANGIOGRAPHY N/A 12/26/2022   Procedure: LEFT HEART CATH AND CORONARY ANGIOGRAPHY;  Surgeon: Court Dorn PARAS, MD;  Location: [facility];  Service: Cardiovascular;  Laterality: N/A;   LEFT HEART CATH AND CORONARY ANGIOGRAPHY N/A 05/16/2023   Procedure: LEFT HEART CATH AND CORONARY ANGIOGRAPHY;  Surgeon: Dann Candyce RAMAN, MD;  Location: [facility] INVASIVE CV LAB;  Service: Cardiovascular;  Laterality: N/A;   OTHER SURGICAL HISTORY     Carotid    RADIOLOGY WITH ANESTHESIA N/A 02/28/2018   Procedure: STENTING;  Surgeon: Dolphus Carrion, MD;  Location: [facility] OR;  Service: Radiology;  Laterality: N/A;   TEE WITHOUT CARDIOVERSION N/A 05/22/2023   Procedure: TRANSESOPHAGEAL ECHOCARDIOGRAM;  Surgeon: Kerrin Elspeth BROCKS, MD;  Location: [facility] OR;  Service: Open Heart Surgery;  Laterality: N/A;   VIDEO BRONCHOSCOPY WITH ENDOBRONCHIAL NAVIGATION N/A 04/27/2023   Procedure: VIDEO BRONCHOSCOPY WITH ENDOBRONCHIAL NAVIGATION W/ BIOPSIES;  Surgeon: Kerrin Elspeth BROCKS, MD;  Location: [facility] OR;  Service: Thoracic;  Laterality: N/A;    (Not in a hospital admission)  No Known Allergies  Social History   Tobacco Use   Smoking status: Former    Types: Cigarettes   Smokeless tobacco: Never  Substance Use Topics   Alcohol use: No    Family History  Problem Relation Age of Onset   Breast cancer Mother    Heart disease Mother    Heart disease Brother    Tremor Neg Hx      Review of Systems Pertinent items are noted in HPI.  Objective:   Patient Vitals for the past 8 hrs:  BP Temp Pulse Resp SpO2 Height Weight  07/23/24 1733 (!) 167/84 98.1 F (36.7 C) (!) 43 (!) 9 97 % -- --  07/23/24 1359 -- -- -- -- -- 6' (1.829 m) 85.3 kg  07/23/24 1333 (!) 146/69 98.1 F (36.7 C) (!) 47 17 95 % -- --   No intake/output data recorded. No intake/output data recorded.    Exam: GCS 4E 4V 84M Oriented to name and place, not year Slow  speech but understandable Conjugate gaze FS 5/5 BUE and BLE throughout SILT    Data ReviewCBC:  Lab Results  Component Value Date   WBC 6.6 07/23/2024   RBC 4.15 (L) 07/23/2024   BMP:  Lab Results  Component Value Date   GLUCOSE 126 (H) 07/23/2024   CO2 24 07/23/2024   BUN 17 07/23/2024   BUN 14 06/12/2023   CREATININE 1.28 (H) 07/23/2024   CALCIUM  9.2 07/23/2024

## 2024-07-23 NOTE — Hospital Course (Signed)
 Gregory [name] is a 89 y.o. 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

## 2024-07-23 NOTE — H&P (Signed)
 History and Physical    Gregory [name] FMW:[id number] DOB: September 09, 1934 DOA: 07/23/2024  PCP: Tanda Prentice DEL, MD  Patient coming from: Home  I have personally briefly reviewed patient's old medical records in [facility]  Chief Complaint: Weakness, confusion  HPI: Gregory [name] is a 89 y.o. male with medical history significant for CAD s/p CABG on ASA/Plavix , postop A-fib (after CABG 05/2023) on amiodarone , history of CVA with mild baseline aphasia, CKD stage IIIa, HLD, BPH, depression/anxiety, bipolar 2 disorder, SCC of RUL s/p SBRT who presented to the ED for evaluation of increasing weakness and confusion.  History is supplemented by patient's son at bedside.  Patient's son states that at baseline patient has been ambulatory on his own, drives himself, and completes ADLs.  He does have baseline mild expressive aphasia with stuttering speech since a remote stroke in 2015.  Over the last month he has noted slowly progressive functional decline.  Patient has been having issues with memory and ambulation.    One week ago patient fell in his yard while walking back into the house.  He remembered falling and did not suffer any significant injury.  On 7/31 patient had another fall in the house at which time he struck his head on the edge of a table.  He did not remember this fall.  He was seen in [facility] ED the same day.  Imaging was notable for an acute SDH overlying the left frontoparietal lobe and extending to the posterior inferior aspect of the sylvian fissure.  The case was discussed with neurosurgery who recommended repeat CT head at 6-hour interval and if stable can be discharged to home.  Repeat CT head did show stable small volume SAH without interval hemorrhage.  Patient's son states that since leaving the hospital he has only gotten worse.  He is now requiring use of a walker and assistance from his son to ambulate.  He has had increasing confusion compared to baseline.  He has  continued taking aspirin  and Plavix  in the interim.  Patient is awake, alert.  He is oriented to self, knows son at bedside, knows he is at [facility], knows the president, and knows the month is August.  He says the year is 2024.  He denies fevers, chills, diaphoresis, chest pain, dyspnea, nausea, vomit, abdominal pain, dysuria.  ED Course  Labs/Imaging on admission: I have personally reviewed following labs and imaging studies.  Initial vitals showed BP 146/69, pulse 47, RR 17, temp 98.1 F, SpO2 95% on room air.  Labs showed WBC 6.6, hemoglobin 12.8, platelets 159, sodium 137, potassium 4.3, bicarb 24, BUN 17, creatinine 1.28, serum glucose 126, AST 99, ALT 64, alk phos 156, total bilirubin 0.8, ammonia 43, INR 1.2.  Urinalysis showed negative nitrites, large leukocytes, 0-5 RBCs, 11-20 WBCs, rare bacteria.  Urine culture ordered and pending.  IMPRESSION: CT HEAD:  1. Subdural hematomas overlying the bilateral cerebral convexities (measuring up to 8 mm in thickness on the left and 4 mm in thickness on the right), new from the head CT of 07/18/2024. No midline shift. 2. Subarachnoid hemorrhage previously demonstrated within the left Sylvian fissure, and along the left frontoparietal convexity, is no longer present. 3. Background parenchymal atrophy, chronic small vessel ischemic disease and chronic bilateral MCA territory infarcts, as described.   CTA NECK:  1. The common carotid and internal carotid arteries are patent within neck. Atherosclerotic plaque bilaterally, as described. Most notably, predominantly calcified plaque within the proximal  left ICA results in 65-70% stenosis (unchanged from the prior CTA of 01/24/2018). Prior right carotid endarterectomy. 2. Vertebral arteries patent within the neck. Progressive atherosclerotic plaque at the right vertebral artery V3/V4 junction results in up to moderate stenosis. 3. Aortic Atherosclerosis (ICD10-I70.0). 4.  Bandlike opacity within the right lung apex, similar to the recent prior chest CT of 06/19/2024. Please refer to this prior examination for further description and for recommendations. 5. Aortic Atherosclerosis (ICD10-I70.0).   CTA HEAD:  1. No proximal intracranial large vessel occlusion is identified. 2. Intracranial atherosclerotic disease as described. Most notably, progressive plaque within the right vertebral artery at the V3/V4 junction results in up to moderate stenosis.  Patient was given IV ceftriaxone .  EDP discussed with neurosurgery (Dr. Darnella) who recommended medical mission, repeat CT head in a.m, and their team will see in consultation.  The hospitalist service was consulted to admit..  Review of Systems: All systems reviewed and are negative except as documented in history of present illness above.   Past Medical History:  Diagnosis Date   Abdominal aneurysm (HCC)    Anginal pain (HCC)    Coronary artery disease    Depression    Heart disease    History of kidney stones    Hyperlipidemia    Hypertension    Myocardial infarction [facility]) 04/19/2023   NSTEMI   Stroke [facility]) 2004?   TIA    Past Surgical History:  Procedure Laterality Date   COLONOSCOPY     CORONARY ANGIOGRAPHY N/A 12/29/2022   Procedure: CORONARY ANGIOGRAPHY;  Surgeon: Court Dorn PARAS, MD;  [facility];  Service: Cardiovascular;  Laterality: N/A;   CORONARY ARTERY BYPASS GRAFT N/A 05/22/2023   Procedure: CORONARY ARTERY BYPASS GRAFTING (CABG) X 2 WITH LEFT INTERNAL MAMMARY ARTERY HARVEST AND GREATER SAPHENOUS VEIN HARVESTED ENDOSCOPICALLY;  Surgeon: Kerrin Elspeth BROCKS, MD;  Location: [facility] OR;  Service: Open Heart Surgery;  Laterality: N/A;   ESOPHAGOGASTRODUODENOSCOPY (EGD) WITH PROPOFOL  N/A 05/27/2024   Procedure: ESOPHAGOGASTRODUODENOSCOPY (EGD) WITH PROPOFOL ;  Surgeon: Legrand Victory LITTIE DOUGLAS, MD;  Location: [facility];  Service: Gastroenterology;  Laterality: N/A;   EYE SURGERY      cataract right   IR ANGIO INTRA EXTRACRAN SEL COM CAROTID INNOMINATE BILAT MOD SED  02/28/2018   IR ANGIO VERTEBRAL SEL SUBCLAVIAN INNOMINATE UNI R MOD SED  02/28/2018   IR RADIOLOGIST EVAL & MGMT  02/12/2021   LEFT HEART CATH AND CORONARY ANGIOGRAPHY N/A 12/26/2022   Procedure: LEFT HEART CATH AND CORONARY ANGIOGRAPHY;  Surgeon: Court Dorn PARAS, MD;  Location: [facility];  Service: Cardiovascular;  Laterality: N/A;   LEFT HEART CATH AND CORONARY ANGIOGRAPHY N/A 05/16/2023   Procedure: LEFT HEART CATH AND CORONARY ANGIOGRAPHY;  Surgeon: Dann Candyce RAMAN, MD;  Location: [facility] INVASIVE CV LAB;  Service: Cardiovascular;  Laterality: N/A;   OTHER SURGICAL HISTORY     Carotid    RADIOLOGY WITH ANESTHESIA N/A 02/28/2018   Procedure: STENTING;  Surgeon: Dolphus Carrion, MD;  Location: [facility] OR;  Service: Radiology;  Laterality: N/A;   TEE WITHOUT CARDIOVERSION N/A 05/22/2023   Procedure: TRANSESOPHAGEAL ECHOCARDIOGRAM;  Surgeon: Kerrin Elspeth BROCKS, MD;  Location: [facility] OR;  Service: Open Heart Surgery;  Laterality: N/A;   VIDEO BRONCHOSCOPY WITH ENDOBRONCHIAL NAVIGATION N/A 04/27/2023   Procedure: VIDEO BRONCHOSCOPY WITH ENDOBRONCHIAL NAVIGATION W/ BIOPSIES;  Surgeon: Kerrin Elspeth BROCKS, MD;  Location: [facility] OR;  Service: Thoracic;  Laterality: N/A;    Social History: Social History   Tobacco Use  Smoking status: Former    Types: Cigarettes   Smokeless tobacco: Never  Vaping Use   Vaping status: Never Used  Substance Use Topics   Alcohol use: No   Drug use: No   No Known Allergies  Family History  Problem Relation Age of Onset   Breast cancer Mother    Heart disease Mother    Heart disease Brother    Tremor Neg Hx      Prior to Admission medications   Medication Sig Start Date End Date Taking? Authorizing Provider  ALPRAZolam  (XANAX ) 0.5 MG tablet Take 0.5-1 mg by mouth in the morning and at bedtime. Take 0.5 mg in the morning and 1 mg at bedtime 03/17/17    [provider]  amiodarone  (PACERONE ) 200 MG tablet Take 1 tablet (200 mg total) by mouth daily. Patient taking differently: Take 200 mg by mouth at bedtime. 08/01/23   Duke, Jon Garre, PA  amLODipine  (NORVASC ) 5 MG tablet Take 5 mg by mouth daily. 07/28/23   [provider]  aspirin  EC 81 MG tablet Take 81 mg by mouth daily. Swallow whole.    [provider]  atorvastatin  (LIPITOR ) 80 MG tablet Take 1 tablet (80 mg total) by mouth daily. 12/27/22   Meng, Hao, PA  clopidogrel  (PLAVIX ) 75 MG tablet Take 75 mg by mouth daily. 05/31/23   [provider]  ferrous sulfate  325 (65 FE) MG EC tablet Take 1 tablet (325 mg total) by mouth daily with breakfast. 05/28/23 05/22/24  Dwan Aldo M, PA-C  isosorbide  mononitrate (IMDUR ) 30 MG 24 hr tablet TAKE 1/2 OF A TABLET (15 MG TOTAL) BY MOUTH DAILY Patient taking differently: Take 15 mg by mouth daily. 12/21/23   Lavona Agent, MD  metoprolol  tartrate (LOPRESSOR ) 25 MG tablet TAKE 1 TABLET BY MOUTH TWICE A DAY 04/05/24   Lavona Agent, MD  mirtazapine  (REMERON ) 7.5 MG tablet TAKE 1 TABLET(7.5 MG) BY MOUTH AT BEDTIME 07/17/24   Cottle, Lorene KANDICE Raddle., MD  Multiple Vitamins-Minerals (CENTRUM ADULTS PO) Take 1 tablet by mouth daily.     [provider]  Multiple Vitamins-Minerals (ZINC PO) Take 1 capsule by mouth daily at 12 noon.    [provider]  nitroGLYCERIN  (NITROSTAT ) 0.4 MG SL tablet Place 0.4 mg under the tongue as needed for chest pain.    [provider]  Omega-3 Fatty Acids (FISH OIL PO) Take 1 capsule by mouth daily.    [provider]  omeprazole  (PRILOSEC) 20 MG capsule Take 1 capsule (20 mg total) by mouth every other day. 05/29/24   Legrand Victory LITTIE DOUGLAS, MD  sertraline  (ZOLOFT ) 50 MG tablet TAKE 3 TABLETS(150 MG) BY MOUTH DAILY 07/17/24   Cottle, Lorene KANDICE Raddle., MD  tamsulosin  (FLOMAX ) 0.4 MG CAPS capsule Take 0.4 mg by mouth at bedtime. 10/24/23   [provider]     Physical Exam: Vitals:   07/23/24 2015 07/23/24 2030 07/23/24 2115 07/23/24 2145  BP: (!) 154/94 (!) 168/64 (!) 162/69 (!) 167/58  Pulse: (!) 48 (!) 46 (!) 48 (!) 47  Resp: 16 15 11 15   Temp:   97.7 F (36.5 C)   TempSrc:   Oral   SpO2: 97% 95% 95% 94%  Weight:      Height:       Constitutional: Resting in bed with head elevated, NAD, calm, comfortable Eyes: PERRL, EOMI, lids and conjunctivae normal ENMT: Mucous membranes are moist. Posterior pharynx clear of any exudate or lesions.Normal dentition.  Neck: normal, supple, no masses. Respiratory: clear to auscultation bilaterally, no wheezing, no crackles. Normal respiratory effort. No accessory muscle use.  Cardiovascular: Bradycardic, soft systolic murmur. No extremity edema. 2+ pedal pulses. Abdomen: no tenderness, no masses palpated. Musculoskeletal: no clubbing / cyanosis. No joint deformity upper and lower extremities. Good ROM, no contractures. Normal muscle tone.  Skin: no rashes, lesions, ulcers. No induration Neurologic: Mild expressive aphasia with occasional stuttering speech.  Sensation intact. Strength 5/5 in all 4.  Psychiatric: Alert and oriented to self, place, month, president, recognizes his son.  He says the year is 2024.  EKG: Personally reviewed. Sinus bradycardia rate 44, first-degree AV block.  Similar to previous.  Assessment/Plan Principal Problem:   Subdural hematoma without coma, without loss of consciousness, initial encounter (HCC) Active Problems:   History of CVA (cerebrovascular accident)   Stage 3a chronic kidney disease (CKD) (HCC)   Coronary artery disease involving native coronary artery of native heart with unstable angina pectoris (HCC)   Hyperlipidemia   Abnormal urinalysis   Elevated LFTs   Gregory [name] is a 89 y.o. male with medical history significant for CAD s/p CABG on ASA/Plavix , postop A-fib (after CABG 05/2023) on amiodarone , history of CVA with mild baseline aphasia, CKD  stage IIIa, HLD, BPH, depression/anxiety, bipolar 2 disorder, SCC of RUL s/p SBRT who is admitted with subdural hematomas.  Assessment and Plan: Subdural hematomas: CT shows new SDH overlying bilateral cerebral convexities, measuring up to 8 mm on the left and 4 mm on the right.  Previous SAH seen on CT 7/31 is no longer present. - Neurosurgery following - Repeat CT head in a.m. ordered - Continue neurochecks - Holding aspirin  and Plavix  - Fall precautions, PT/OT eval  Abnormal urinalysis: UA with large leukocytes, 11-20 WBCs, rare bacteria.  Patient denies urinary symptoms.  He was given empiric ceftriaxone  in the ED.  Hold further antibiotics.  Urine culture pending.  Generalized weakness/functional decline: Ongoing issue for about 1 month.  He had outpatient workup with his PCP on 7/18 which was largely unrevealing.  Ammonia is mildly elevated.  With prior history of CVA and recent SAH and now SDH he is prone to developing vascular dementia. - PT/OT eval - He has been referred to neurology as an outpatient by his PCP  CAD s/p CABG: Stable, denies chest pain.  Holding aspirin  and Plavix  due to recent [facility] and now new SDH.  Holding metoprolol  due to bradycardia.  Statin on hold for now given elevated LFTs.  Continue Imdur .  Sinus bradycardia History of postoperative atrial fibrillation: Patient with persistent sinus bradycardia.  He has not been on full dose anticoagulation as an outpatient but aspirin /Plavix  instead. - Hold amiodarone ; recommend follow-up with cardiology to discuss ongoing use - Hold Lopressor   Elevated LFTs: Patient with persistently elevated AST/ALT.  He denies abdominal symptoms.  Abdomen is benign on exam.  Potentially due to medication effect. - Holding amiodarone  and atorvastatin  - Obtain RUQ ultrasound  History of CVA: He has residual mild aphasia from prior stroke.  Holding aspirin , Plavix , statin as above.  CKD stage IIIa: Renal function  stable.  Hyperlipidemia: Holding statin as above.  Hypothyroidism: Continue Synthroid  25 mcg.  Depression/anxiety/bipolar 2 disorder: Continue Zoloft , Remeron , Xanax .  BPH: Continue Flomax .  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  Follows with radiation/oncology for active surveillance.    DVT prophylaxis: SCDs Start: 07/23/24 2209 Code Status: Full code, discussed with patient on admission Family Communication: Son at bedside  Disposition Plan: From home, dispo pending clinical progress Consults called: Neurosurgery Severity of Illness: The appropriate patient status for this patient is OBSERVATION. Observation status is judged to be reasonable and necessary in order to provide the required intensity of service to ensure the patient's safety. The patient's presenting symptoms, physical exam findings, and initial radiographic and laboratory data in the context of their medical condition is felt to place them at decreased risk for further clinical deterioration. Furthermore, it is anticipated that the patient will be medically stable for discharge from the hospital within 2 midnights of admission.   Jorie Blanch MD [facility]  If 7PM-7AM, please contact night-coverage [email or web address]  07/23/2024, 11:02 PM

## 2024-07-24 ENCOUNTER — Observation Stay (HOSPITAL_COMMUNITY)

## 2024-07-24 DIAGNOSIS — K760 Fatty (change of) liver, not elsewhere classified: Secondary | ICD-10-CM | POA: Diagnosis present

## 2024-07-24 DIAGNOSIS — I69320 Aphasia following cerebral infarction: Secondary | ICD-10-CM | POA: Diagnosis not present

## 2024-07-24 DIAGNOSIS — N1831 Chronic kidney disease, stage 3a: Secondary | ICD-10-CM

## 2024-07-24 DIAGNOSIS — Z8673 Personal history of transient ischemic attack (TIA), and cerebral infarction without residual deficits: Secondary | ICD-10-CM | POA: Diagnosis not present

## 2024-07-24 DIAGNOSIS — Z87891 Personal history of nicotine dependence: Secondary | ICD-10-CM | POA: Diagnosis not present

## 2024-07-24 DIAGNOSIS — Z951 Presence of aortocoronary bypass graft: Secondary | ICD-10-CM | POA: Diagnosis not present

## 2024-07-24 DIAGNOSIS — Z7902 Long term (current) use of antithrombotics/antiplatelets: Secondary | ICD-10-CM | POA: Diagnosis not present

## 2024-07-24 DIAGNOSIS — N400 Enlarged prostate without lower urinary tract symptoms: Secondary | ICD-10-CM | POA: Diagnosis present

## 2024-07-24 DIAGNOSIS — I5032 Chronic diastolic (congestive) heart failure: Secondary | ICD-10-CM | POA: Diagnosis present

## 2024-07-24 DIAGNOSIS — Z803 Family history of malignant neoplasm of breast: Secondary | ICD-10-CM | POA: Diagnosis not present

## 2024-07-24 DIAGNOSIS — S065XAA Traumatic subdural hemorrhage with loss of consciousness status unknown, initial encounter: Secondary | ICD-10-CM | POA: Diagnosis not present

## 2024-07-24 DIAGNOSIS — Z7982 Long term (current) use of aspirin: Secondary | ICD-10-CM | POA: Diagnosis not present

## 2024-07-24 DIAGNOSIS — C3411 Malignant neoplasm of upper lobe, right bronchus or lung: Secondary | ICD-10-CM | POA: Diagnosis present

## 2024-07-24 DIAGNOSIS — I252 Old myocardial infarction: Secondary | ICD-10-CM | POA: Diagnosis not present

## 2024-07-24 DIAGNOSIS — R5381 Other malaise: Secondary | ICD-10-CM | POA: Diagnosis present

## 2024-07-24 DIAGNOSIS — E782 Mixed hyperlipidemia: Secondary | ICD-10-CM

## 2024-07-24 DIAGNOSIS — E039 Hypothyroidism, unspecified: Secondary | ICD-10-CM | POA: Diagnosis present

## 2024-07-24 DIAGNOSIS — I440 Atrioventricular block, first degree: Secondary | ICD-10-CM | POA: Diagnosis present

## 2024-07-24 DIAGNOSIS — F419 Anxiety disorder, unspecified: Secondary | ICD-10-CM | POA: Diagnosis present

## 2024-07-24 DIAGNOSIS — Z8249 Family history of ischemic heart disease and other diseases of the circulatory system: Secondary | ICD-10-CM | POA: Diagnosis not present

## 2024-07-24 DIAGNOSIS — I130 Hypertensive heart and chronic kidney disease with heart failure and stage 1 through stage 4 chronic kidney disease, or unspecified chronic kidney disease: Secondary | ICD-10-CM | POA: Diagnosis present

## 2024-07-24 DIAGNOSIS — R8290 Unspecified abnormal findings in urine: Secondary | ICD-10-CM | POA: Diagnosis not present

## 2024-07-24 DIAGNOSIS — R7989 Other specified abnormal findings of blood chemistry: Secondary | ICD-10-CM | POA: Diagnosis not present

## 2024-07-24 DIAGNOSIS — I25110 Atherosclerotic heart disease of native coronary artery with unstable angina pectoris: Secondary | ICD-10-CM | POA: Diagnosis present

## 2024-07-24 DIAGNOSIS — E785 Hyperlipidemia, unspecified: Secondary | ICD-10-CM | POA: Diagnosis present

## 2024-07-24 DIAGNOSIS — R410 Disorientation, unspecified: Secondary | ICD-10-CM | POA: Diagnosis present

## 2024-07-24 DIAGNOSIS — F3181 Bipolar II disorder: Secondary | ICD-10-CM | POA: Diagnosis present

## 2024-07-24 DIAGNOSIS — S065X0A Traumatic subdural hemorrhage without loss of consciousness, initial encounter: Secondary | ICD-10-CM | POA: Diagnosis present

## 2024-07-24 DIAGNOSIS — W1839XA Other fall on same level, initial encounter: Secondary | ICD-10-CM | POA: Diagnosis present

## 2024-07-24 DIAGNOSIS — Z79899 Other long term (current) drug therapy: Secondary | ICD-10-CM | POA: Diagnosis not present

## 2024-07-24 DIAGNOSIS — Z923 Personal history of irradiation: Secondary | ICD-10-CM | POA: Diagnosis not present

## 2024-07-24 DIAGNOSIS — Y92008 Other place in unspecified non-institutional (private) residence as the place of occurrence of the external cause: Secondary | ICD-10-CM | POA: Diagnosis not present

## 2024-07-24 LAB — COMPREHENSIVE METABOLIC PANEL WITH GFR
ALT: 64 U/L — ABNORMAL HIGH (ref 0–44)
AST: 117 U/L — ABNORMAL HIGH (ref 15–41)
Albumin: 2.8 g/dL — ABNORMAL LOW (ref 3.5–5.0)
Alkaline Phosphatase: 114 U/L (ref 38–126)
Anion gap: 8 (ref 5–15)
BUN: 15 mg/dL (ref 8–23)
CO2: 22 mmol/L (ref 22–32)
Calcium: 9 mg/dL (ref 8.9–10.3)
Chloride: 107 mmol/L (ref 98–111)
Creatinine, Ser: 1.17 mg/dL (ref 0.61–1.24)
GFR, Estimated: >60 mL/min (ref >60)
Glucose, Bld: 103 mg/dL — ABNORMAL HIGH (ref 70–99)
Potassium: 4 mmol/L (ref 3.5–5.1)
Sodium: 137 mmol/L (ref 135–145)
Total Bilirubin: 1.3 mg/dL — ABNORMAL HIGH (ref 0.0–1.2)
Total Protein: 6.7 g/dL (ref 6.5–8.1)

## 2024-07-24 LAB — CBC
HCT: 38.6 % — ABNORMAL LOW (ref 39.0–52.0)
Hemoglobin: 12.6 g/dL — ABNORMAL LOW (ref 13.0–17.0)
MCH: 31.5 pg (ref 26.0–34.0)
MCHC: 32.6 g/dL (ref 30.0–36.0)
MCV: 96.5 fL (ref 80.0–100.0)
Platelets: 134 K/uL — ABNORMAL LOW (ref 150–400)
RBC: 4 MIL/uL — ABNORMAL LOW (ref 4.22–5.81)
RDW: 13.7 % (ref 11.5–15.5)
WBC: 6.6 K/uL (ref 4.0–10.5)
nRBC: 0 % (ref 0.0–0.2)

## 2024-07-24 MED ORDER — ISOSORBIDE MONONITRATE ER 30 MG PO TB24
15.0000 mg | ORAL_TABLET | Freq: Once | ORAL | Status: AC
  Administered 2024-07-24: 15 mg via ORAL
  Filled 2024-07-24: qty 1

## 2024-07-24 MED ORDER — ACETAMINOPHEN 325 MG PO TABS
650.0000 mg | ORAL_TABLET | ORAL | Status: DC | PRN
  Administered 2024-07-24: 650 mg via ORAL
  Filled 2024-07-24: qty 2

## 2024-07-24 MED ORDER — ISOSORBIDE MONONITRATE ER 30 MG PO TB24
30.0000 mg | ORAL_TABLET | Freq: Every day | ORAL | Status: DC
  Administered 2024-07-25: 30 mg via ORAL
  Filled 2024-07-24: qty 1

## 2024-07-24 MED ORDER — HYDRALAZINE HCL 20 MG/ML IJ SOLN
5.0000 mg | INTRAMUSCULAR | Status: DC | PRN
  Administered 2024-07-25: 5 mg via INTRAVENOUS
  Filled 2024-07-24: qty 1

## 2024-07-24 NOTE — Evaluation (Signed)
 Physical Therapy Evaluation Patient Details Name: Gregory [name] MRN: [id number] DOB: 03/08/1934 Today's Date: 07/24/2024  History of Present Illness  Pt is an 89 y.o. male presenting 8/5 with worsening balance, increased sleeping, confusion after having 2 falls this past week. ED visit at [facility] 7/32 and dx with snall volume SAH within the L [place] fuissure. Pt with bilateral SDH. PMH significant for CAD s/p CABG, afib, CVA, CKDIIIa, HLD, BPH, CHF, depression/anxiety, bipolar 2, SCC or RUL s/p SBRT.  Clinical Impression  Until last week pt independent with mobility without assistive device. Currently requiring min/CG assist for ambulation. Pt's son lives with pt and pt's wife and has been providing assist with amb in the home and other son says that can continue. Recommend HHPT and assist from son with amb at home.         If plan is discharge home, recommend the following: A little help with walking and/or transfers;A little help with bathing/dressing/bathroom;Assist for transportation;Help with stairs or ramp for entrance   Can travel by private vehicle        Equipment Recommendations None recommended by PT  Recommendations for Other Services       Functional Status Assessment Patient has had a recent decline in their functional status and demonstrates the ability to make significant improvements in function in a reasonable and predictable amount of time.     Precautions / Restrictions Precautions Precautions: Fall Restrictions Weight Bearing Restrictions Per Provider Order: No      Mobility  Bed Mobility Overal bed mobility: Needs Assistance Bed Mobility: Supine to Sit, Sit to Supine     Supine to sit: Min assist Sit to supine: Min assist   General bed mobility comments: Assist to elevate trunk into sitting. Assist to bring legs back up into bed.    Transfers Overall transfer level: Needs assistance Equipment used: Rollator (4 wheels) Transfers: Sit to/from  Stand Sit to Stand: Min assist           General transfer comment: Assist for balance    Ambulation/Gait Ambulation/Gait assistance: Contact guard assist, Min assist Gait Distance (Feet): 200 Feet Assistive device: Rollator (4 wheels) Gait Pattern/deviations: Step-through pattern, Decreased stride length, Trunk flexed, Drifts right/left Gait velocity: too fast at times     General Gait Details: Assist for safety and balance. Verbal cues to stay closer to rollator.  Stairs            Wheelchair Mobility     Tilt Bed    Modified Rankin (Stroke Patients Only) Modified Rankin (Stroke Patients Only) Pre-Morbid Rankin Score: Slight disability Modified Rankin: Moderately severe disability     Balance Overall balance assessment: Needs assistance Sitting-balance support: No upper extremity supported, Feet supported Sitting balance-Leahy Scale: Fair     Standing balance support: Single extremity supported, Bilateral upper extremity supported, During functional activity, Reliant on assistive device for balance Standing balance-Leahy Scale: Poor Standing balance comment: UE support                             Pertinent Vitals/Pain Pain Assessment Pain Assessment: No/denies pain    Home Living Family/patient expects to be discharged to:: Private residence Living Arrangements: Spouse/significant other;Children (son lives with pt) Available Help at Discharge: Family;Available 24 hours/day Type of Home: House Home Access: Stairs to enter Entrance Stairs-Rails: Left Entrance Stairs-Number of Steps: 4   Home Layout: Multi-level;Able to live on main level with bedroom/bathroom Home Equipment:  Rolling Walker (2 wheels);Shower seat      Prior Function Prior Level of Function : Independent/Modified Independent;Driving             Mobility Comments: Until last week pt modified independent without assistive device. Amb with walker and assist the last  several days. ADLs Comments: Able to perform until the last several days     Extremity/Trunk Assessment   Upper Extremity Assessment Upper Extremity Assessment: Defer to OT evaluation    Lower Extremity Assessment Lower Extremity Assessment: Generalized weakness       Communication   Communication Communication: Impaired Factors Affecting Communication: Hearing impaired    Cognition Arousal: Alert Behavior During Therapy: WFL for tasks assessed/performed   PT - Cognitive impairments: Orientation, Memory, Problem solving, Awareness   Orientation impairments: Time                     Following commands: Intact       Cueing       General Comments General comments (skin integrity, edema, etc.): HR 48-56 during treatment. BP stable    Exercises     Assessment/Plan    PT Assessment Patient needs continued PT services  PT Problem List Decreased strength;Decreased balance;Decreased mobility;Decreased cognition;Decreased knowledge of use of DME       PT Treatment Interventions DME instruction;Gait training;Stair training;Functional mobility training;Therapeutic activities;Therapeutic exercise;Balance training;Patient/family education    PT Goals (Current goals can be found in the Care Plan section)  Acute Rehab PT Goals Patient Stated Goal: go home PT Goal Formulation: With patient/family Time For Goal Achievement: 08/07/24 Potential to Achieve Goals: Good    Frequency Min 2X/week     Co-evaluation               AM-PAC PT 6 Clicks Mobility  Outcome Measure Help needed turning from your back to your side while in a flat bed without using bedrails?: A Little Help needed moving from lying on your back to sitting on the side of a flat bed without using bedrails?: A Little Help needed moving to and from a bed to a chair (including a wheelchair)?: A Little Help needed standing up from a chair using your arms (e.g., wheelchair or bedside chair)?: A  Little Help needed to walk in hospital room?: A Little Help needed climbing 3-5 steps with a railing? : A Little 6 Click Score: 18    End of Session Equipment Utilized During Treatment: Gait belt Activity Tolerance: Patient tolerated treatment well Patient left: in bed;with call bell/phone within reach;with family/visitor present   PT Visit Diagnosis: Unsteadiness on feet (R26.81);Other abnormalities of gait and mobility (R26.89);History of falling (Z91.81)    Time: 8658-8591 PT Time Calculation (min) (ACUTE ONLY): 27 min   Charges:   PT Evaluation $PT Eval Moderate Complexity: 1 Mod PT Treatments $Gait Training: 8-22 mins PT General Charges $$ ACUTE PT VISIT: 1 Visit         [facility] PT Acute Rehabilitation Services Office [phone]   Rodgers ORN [facility] 07/24/2024, 2:30 PM

## 2024-07-24 NOTE — Plan of Care (Signed)

## 2024-07-24 NOTE — ED Notes (Signed)
 PT to bedside

## 2024-07-24 NOTE — Evaluation (Signed)
 Occupational Therapy Evaluation Patient Details Name: Gregory [name] MRN: [id number] DOB: Apr 13, 1934 Today's Date: 07/24/2024   History of Present Illness   Pt is an 89 y.o. male presenting 8/5 with worsening balance, increased sleeping, confusion after having 2 falls this past week. ED visit at [facility] 7/32 and dx with snall volume SAH within the L [place] fuissure. Pt with bilateral SDH. PMH significant for CAD s/p CABG, afib, CVA, CKDIIIa, HLD, BPH, CHF, depression/anxiety, bipolar 2, SCC or RUL s/p SBRT.     Clinical Impressions PTA, pt lived with wife and reports being independent in ADL, IADL, driving. Upon eval, pt with decreased strength and balance. Pt additionally with mild intermittent L listing with use of RW as well as kicking RW with R foot during gait. Visual fields testing inconsistent; increased time during letter cancellation task. Pt needing grossly set-up to min A for BADL at time of eval. Will continue to follow acutely. Pt with good support. Recommending discharge home with HHOT.      If plan is discharge home, recommend the following:   A little help with walking and/or transfers;A little help with bathing/dressing/bathroom;Assistance with cooking/housework;Assist for transportation;Help with stairs or ramp for entrance     Functional Status Assessment   Patient has had a recent decline in their functional status and demonstrates the ability to make significant improvements in function in a reasonable and predictable amount of time.     Equipment Recommendations   None recommended by OT     Recommendations for Other Services         Precautions/Restrictions   Precautions Precautions: Fall Restrictions Weight Bearing Restrictions Per Provider Order: No     Mobility Bed Mobility Overal bed mobility: Needs Assistance Bed Mobility: Supine to Sit, Sit to Supine     Supine to sit: Min assist Sit to supine: Min assist   General bed mobility  comments: Assist to elevate trunk into sitting. Assist to bring legs back up into bed.    Transfers Overall transfer level: Needs assistance Equipment used: Rollator (4 wheels) Transfers: Sit to/from Stand Sit to Stand: Min assist           General transfer comment: Assist for balance      Balance Overall balance assessment: Needs assistance Sitting-balance support: No upper extremity supported, Feet supported Sitting balance-Leahy Scale: Fair     Standing balance support: Single extremity supported, Bilateral upper extremity supported, During functional activity, Reliant on assistive device for balance Standing balance-Leahy Scale: Poor Standing balance comment: UE support                           ADL either performed or assessed with clinical judgement   ADL Overall ADL's : Needs assistance/impaired Eating/Feeding: Modified independent;Bed level   Grooming: Set up;Sitting   Upper Body Bathing: Set up;Sitting   Lower Body Bathing: Minimal assistance;Sit to/from stand   Upper Body Dressing : Set up;Sitting   Lower Body Dressing: Minimal assistance;Sit to/from stand   Toilet Transfer: Contact guard assist;Ambulation;Rolling walker (2 wheels);Minimal assistance Toilet Transfer Details (indicate cue type and reason): intermittent min A for balance. Occasional L listing.         Functional mobility during ADLs: Contact guard assist;Minimal assistance;Rolling walker (2 wheels)       Vision Baseline Vision/History: 1 Wears glasses Ability to See in Adequate Light: 0 Adequate Patient Visual Report: No change from baseline Vision Assessment?: Vision impaired- to be further tested  in functional context Additional Comments: Pt able to track without dififculty, letter cancellation with increased time; inconsistent report on visual fields assessment in T lower quadrant     Perception         Praxis         Pertinent Vitals/Pain Pain Assessment Pain  Assessment: No/denies pain     Extremity/Trunk Assessment Upper Extremity Assessment Upper Extremity Assessment: Generalized weakness;Right hand dominant (questionable mild decr coordination on R but fairly even)   Lower Extremity Assessment Lower Extremity Assessment: Defer to PT evaluation       Communication Communication Communication: Impaired Factors Affecting Communication: Hearing impaired   Cognition Arousal: Alert Behavior During Therapy: WFL for tasks assessed/performed Cognition: Cognition impaired       Memory impairment (select all impairments): Short-term memory     OT - Cognition Comments: follows commands; assessment limited by pt being hard of hearing                 Following commands: Intact       Cueing  General Comments   Cueing Techniques: Verbal cues  HR 48-62   Exercises     Shoulder Instructions      Home Living Family/patient expects to be discharged to:: Private residence Living Arrangements: Spouse/significant other;Children (son lives with pt) Available Help at Discharge: Family;Available 24 hours/day Type of Home: House Home Access: Stairs to enter [employer] of Steps: 4 Entrance Stairs-Rails: Left Home Layout: Multi-level;Able to live on main level with bedroom/bathroom     Bathroom Shower/Tub: Arts development officer Toilet: Handicapped height     Home Equipment: Agricultural consultant (2 wheels);Shower seat;Hand held shower head          Prior Functioning/Environment Prior Level of Function : Independent/Modified Independent;Driving             Mobility Comments: Until last week pt modified independent without assistive device. Amb with walker and assist the last several days. ADLs Comments: Able to perform until the last several days    OT Problem List: Decreased strength;Decreased activity tolerance;Impaired balance (sitting and/or standing);Decreased safety awareness;Decreased knowledge of use  of DME or AE   OT Treatment/Interventions: Self-care/ADL training;Therapeutic exercise;DME and/or AE instruction;Therapeutic activities;Patient/family education;Balance training      OT Goals(Current goals can be found in the care plan section)   Acute Rehab OT Goals Patient Stated Goal: go home OT Goal Formulation: With patient Time For Goal Achievement: 08/07/24 Potential to Achieve Goals: Good   OT Frequency:  Min 2X/week    Co-evaluation              AM-PAC OT 6 Clicks Daily Activity     Outcome Measure Help from another person eating meals?: None Help from another person taking care of personal grooming?: A Little Help from another person toileting, which includes using toliet, bedpan, or urinal?: A Little Help from another person bathing (including washing, rinsing, drying)?: A Little Help from another person to put on and taking off regular upper body clothing?: A Little Help from another person to put on and taking off regular lower body clothing?: A Little 6 Click Score: 19   End of Session Equipment Utilized During Treatment: Gait belt;Rolling walker (2 wheels) Nurse Communication: Mobility status  Activity Tolerance: Patient tolerated treatment well Patient left: in bed;with call bell/phone within reach;with family/visitor present  OT Visit Diagnosis: Unsteadiness on feet (R26.81);Muscle weakness (generalized) (M62.81)  Time: 8473-8441 OT Time Calculation (min): 32 min Charges:  OT General Charges $OT Visit: 1 Visit OT Evaluation $OT Eval Moderate Complexity: 1 Mod OT Treatments $Self Care/Home Management : 8-22 mins  Elma JONETTA Lebron FREDERICK, OTR/L [facility] Acute Rehabilitation Office: [phone]   Elma JONETTA Lebron 07/24/2024, 5:02 PM

## 2024-07-24 NOTE — Progress Notes (Signed)
 CT reviewed - stable. SDH unlikely to be the source of his confusino  SBP<160 Will arrange for repeat CT head in 2 weeks Hold home aspirin  and plavix  until after 2 week CT head complete Will arrange outpatient f/u Neurosurgery team to sign off. Thank you for allowing us  to participate in the care of this patient. Please do not hesitate to call us  with questions or concerns

## 2024-07-24 NOTE — Progress Notes (Signed)
 [facility]                                                                               Gregory [name], is a 89 y.o. male, DOB - 06-21-1934, FMW:[id number] Admit date - 07/23/2024    Outpatient Primary MD for the patient is Gregory Prentice DEL, MD  LOS - 0  days    Brief summary   Gregory [name] is a 89 y.o. 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   Assessment & Plan    Assessment and Plan:   Subdural hematomas CT head on admission showed new SDH overlying bilateral, cerebral convexities measuring up to 8 mm on the left and 4 mm on the right. Repeat CT this morning did not show worsening of the subdural hematoma. Neurosurgery consulted recommended holding all anticoagulation and blood thinners for at least 2 weeks.  Recommended outpatient follow-up with the CT head. PT/OT evaluations ordered.  Fall precautions in place.    Abnormal urine analysis. Urine cultures pending, patient received a dose of IV ceftriaxone  in the ED.    Generalized weakness with functional decline Going on for about a month. Patient is at high risk for developing vascular dementia due to his recent history of CVA, subarachnoid hemorrhage and subdural hematoma. Recommend outpatient follow-up with neurology.    History of coronary artery disease s/p CABG No chest pain.  Holding aspirin  and Plavix  due to recent [facility] Patient reports that they were holding his beta-blocker/metoprolol  due to bradycardia.    Sinus bradycardia History of postoperative atrial fibrillation Patient continues to be bradycardic will follow hold amiodarone  and metoprolol  for now.    Elevated liver enzymes Statin on hold Right upper quadrant ultrasound showed Hepatic  steatosis. SABRA   History of CVA: He has residual mild aphasia from prior stroke.  Holding aspirin , Plavix , statin as above.   CKD stage IIIa: Renal function stable.   Hyperlipidemia: Holding statin as above.   Hypothyroidism: Continue Synthroid  25 mcg.   Depression/anxiety/bipolar 2 disorder: Continue Zoloft , Remeron , Xanax .   BPH: Continue Flomax .   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  Follows with radiation/oncology for active surveillance.  Estimated body mass index is 25.5 kg/m as calculated from the following:   Height as of this encounter: 6' (1.829 m).   Weight as of this encounter: 85.3 kg.  Code Status: Full code DVT Prophylaxis:  SCDs Start: 07/23/24 2209   Level of Care: Level of care: Telemetry Medical Family Communication: Updated patient's family at bedside  Disposition Plan:     Remains inpatient appropriate: Pending clinical improvement  Procedures:  CT head.   Consultants:   Neurosurgery.   Antimicrobials:   Anti-infectives (From admission, onward)    Start     Dose/Rate Route Frequency Ordered Stop   07/23/24 2030  cefTRIAXone  (ROCEPHIN ) 1 g in sodium chloride  0.9 % 100 mL IVPB        1 g 200  mL/hr over 30 Minutes Intravenous  Once 07/23/24 2028 07/23/24 2121        Medications  Scheduled Meds:  ALPRAZolam   1 mg Oral QHS   [START ON 07/25/2024] isosorbide  mononitrate  30 mg Oral Daily   levothyroxine   25 mcg Oral QAC breakfast   mirtazapine   7.5 mg Oral QHS   sertraline   150 mg Oral Daily   sodium chloride  flush  3 mL Intravenous Q12H   tamsulosin   0.4 mg Oral QHS   Continuous Infusions: PRN Meds:.acetaminophen , bisacodyl , hydrALAZINE , ondansetron  **OR** ondansetron  (ZOFRAN ) IV, senna-docusate    Subjective:   Gregory [name] was seen and examined today.  Still having speech difficulty and some confusion.   Objective:   Vitals:   07/24/24 0830 07/24/24 0845 07/24/24 1000 07/24/24 1100  BP: (!) 167/73 (!)  163/71 (!) 177/77 (!) 192/83  Pulse: (!) 48 (!) 50 (!) 49 (!) 52  Resp: 10 15 16 18   Temp:      TempSrc:      SpO2: 95% 92% 97% 96%  Weight:      Height:        Intake/Output Summary (Last 24 hours) at 07/24/2024 1139 Last data filed at 07/23/2024 2236 Gross per 24 hour  Intake 1100 ml  Output --  Net 1100 ml   Filed Weights   07/23/24 1359  Weight: 85.3 kg     Exam General exam: Appears calm and comfortable  Respiratory system: Clear to auscultation. Respiratory effort normal. Cardiovascular system: S1 & S2 heard, RRR. No JVD,  Gastrointestinal system: Abdomen is nondistended, soft and nontender.  Central nervous system: Alert and oriented to person and place, hard of hearing.  Extremities: Symmetric 5 x 5 power. Skin: No rashes,  Psychiatry:  Mood & affect appropriate.    Data Reviewed:  I have personally reviewed following labs and imaging studies   CBC Lab Results  Component Value Date   WBC 6.6 07/24/2024   RBC 4.00 (L) 07/24/2024   HGB 12.6 (L) 07/24/2024   HCT 38.6 (L) 07/24/2024   MCV 96.5 07/24/2024   MCH 31.5 07/24/2024   PLT 134 (L) 07/24/2024   MCHC 32.6 07/24/2024   RDW 13.7 07/24/2024   LYMPHSABS 1.1 07/18/2024   MONOABS 0.5 07/18/2024   EOSABS 0.2 07/18/2024   BASOSABS 0.1 07/18/2024     Last metabolic panel Lab Results  Component Value Date   NA 137 07/24/2024   K 4.0 07/24/2024   CL 107 07/24/2024   CO2 22 07/24/2024   BUN 15 07/24/2024   CREATININE 1.17 07/24/2024   GLUCOSE 103 (H) 07/24/2024   GFRNONAA >60 07/24/2024   GFRAA >60 02/27/2018   CALCIUM  9.0 07/24/2024   PHOS 3.5 04/24/2024   PROT 6.7 07/24/2024   ALBUMIN  2.8 (L) 07/24/2024   BILITOT 1.3 (H) 07/24/2024   ALKPHOS 114 07/24/2024   AST 117 (H) 07/24/2024   ALT 64 (H) 07/24/2024   ANIONGAP 8 07/24/2024    CBG (last 3)  No results for input(s): GLUCAP in the last 72 hours.    Coagulation Profile: Recent Labs  Lab 07/23/24 1907  INR 1.2     Radiology  Studies: CT HEAD WO CONTRAST ( ) Result Date: 07/24/2024 CLINICAL DATA:  Head trauma, minor (Age >= 65y) EXAM: CT HEAD WITHOUT CONTRAST TECHNIQUE: Contiguous axial images were obtained from the base of the skull through the vertex without intravenous contrast. RADIATION DOSE REDUCTION: This exam was performed according to the departmental dose-optimization program which includes automated  exposure control, adjustment of the mA and/or kV according to patient size and/or use of iterative reconstruction technique. COMPARISON:  CT head/neck 07/23/2024. FINDINGS: Brain: No substantial change in bilateral subdural hematomas. No mass effect. No evidence of acute large vascular territory infarct. Similar right frontal encephalomalacia. No hydrocephalus, mass lesion or midline shift. Cerebral atrophy. Vascular: No hyperdense vessel.  Calcific atherosclerosis. Skull: No acute fracture. Sinuses/Orbits: Clear sinuses.  No acute orbital findings. Other: No mastoid effusions. IMPRESSION: No substantial change in bilateral subdural hematomas, 8 mm in thickness on the left and 4 mm in thickness on the right. No midline shift. Electronically Signed   By: Gilmore GORMAN Molt M.D.   On: 07/24/2024 04:06   US  Abdomen Limited RUQ (LIVER/GB) Result Date: 07/23/2024 EXAM: Right Upper Quadrant Abdominal Ultrasound 07/23/2024 10:59:49 PM TECHNIQUE: Real-time ultrasonography of the right upper quadrant of the abdomen was performed. COMPARISON: None available. CLINICAL HISTORY: 221910 Elevated LFTs 221910. Elevated LFTs. FINDINGS: LIVER: The liver demonstratesincreased echogenicity. No intrahepatic biliary ductal dilatation. No evidence of mass. BILIARY SYSTEM: No pericholecystic fluid or wall thickening. No cholelithiasis. Negative sonographic Murphy's sign. Common bile duct is within normal limits measuring 6mm. RIGHT KIDNEY: The right kidney is grossly unremarkable in appearances without evidence of hydronephrosis, echogenic calculi or  worrisome mass lesions. OTHER: Trace right pleural effusion. IMPRESSION: 1. Hepatic steatosis. 2. Trace right pleural effusion. Electronically signed by: Norman Gatlin MD 07/23/2024 11:15 PM EDT RP Workstation: [id number]   CT ANGIO HEAD NECK W WO CM Result Date: 07/23/2024 CLINICAL DATA:  Provided history: Vertigo, central. EXAM: CT ANGIOGRAPHY HEAD AND NECK WITH AND WITHOUT CONTRAST TECHNIQUE: Multidetector CT imaging of the head and neck was performed using the standard protocol during bolus administration of intravenous contrast. Multiplanar CT image reconstructions and MIPs were obtained to evaluate the vascular anatomy. Carotid stenosis measurements (when applicable) are obtained utilizing NASCET criteria, using the distal internal carotid diameter as the denominator. RADIATION DOSE REDUCTION: This exam was performed according to the departmental dose-optimization program which includes automated exposure control, adjustment of the mA and/or kV according to patient size and/or use of iterative reconstruction technique. CONTRAST:  75mL OMNIPAQUE  IOHEXOL  350 MG/ML SOLN COMPARISON:  Head CT 07/18/2024. CT angiogram head/neck 01/24/2018. Chest CT 06/19/2024. FINDINGS: CT HEAD FINDINGS Brain: Generalized cerebral atrophy. Subarachnoid hemorrhage previously demonstrated within the left sylvian hip fissure and along the left frontoparietal convexity is no longer present. However new from the prior head CT of 07/18/2024, there are subdural hematomas overlying the bilateral cerebral convexities (measuring up to 8 mm in thickness on the left and 4 mm in thickness on the right). No midline shift. Known chronic cortical/subcortical right MCA territory infarcts within the frontal and parietal lobes. Redemonstrated small chronic cortical/subcortical infarct at the left temporoparietal junction (MCA territory). Background mild patchy and ill-defined hypoattenuation within the cerebral white matter, nonspecific but  compatible with chronic small vessel ischemic disease. No demarcated cortical infarct. No evidence of an intracranial mass. No midline shift. Vascular: No hyperdense vessel.  Atherosclerotic calcifications. Skull: No calvarial fracture or aggressive osseous lesion. Sinuses/Orbits: No mass or acute finding within the imaged orbits. Minimal mucosal thickening within the bilateral maxillary sinuses. Review of the MIP images confirms the above findings CTA NECK FINDINGS Aortic arch: Standard aortic branching. Atherosclerotic plaque within the aortic arch and proximal major branch vessels of the neck. Streak/beam hardening artifact arising from a dense contrast bolus partially obscures the left subclavian artery. Within this limitation, there is no appreciable hemodynamically significant innominate or  proximal subclavian artery stenosis. Right carotid system: Prior right carotid endarterectomy. CCA and ICA patent within the neck without stenosis. Atherosclerotic plaque at the CCA origin, within the distal CCA, but the carotid bifurcation and within the proximal ICA. Left carotid system: CCA and ICA patent within the neck. Atherosclerotic plaque scattered within the CCA, but the carotid bifurcation and scattered within the ICA. Most notably, predominantly calcified atherosclerotic plaque at the ICA origin results in 65-70% stenosis (unchanged from the prior CTA of 01/24/2018). Vertebral arteries: Codominant and patent within the neck. Atherosclerotic plaque within both vessels. Most notably, progressive sclerotic plaque within the right vertebral artery V3/V4 junction results in up to moderate stenosis. Skeleton: C3-C4 vertebral ankylosis. Cervical spondylosis. Disc space narrowing is advanced at C5-C6 and C6-C7. Grade 1 anterolisthesis at C7-T1, T1-T2 and T2-T3. Dextrocurvature of the upper/mid thoracic spine, partially imaged. Other neck: No neck mass or cervical lymphadenopathy. Upper chest: Bandlike opacity within the  right upper lobe, similar to the recent prior chest CT of 06/19/2024. Prior median sternotomy. Review of the MIP images confirms the above findings CTA HEAD FINDINGS Anterior circulation: The intracranial internal carotid arteries are patent. Calcified atherosclerotic plaque within both vessels resulting in no more than mild stenosis. The M1 middle cerebral arteries are patent. No M2 proximal branch occlusion or high-grade proximal stenosis. The anterior cerebral arteries are patent. No intracranial aneurysm is identified. Posterior circulation: The intracranial vertebral arteries are patent. Atherosclerotic plaque within both vessels. Most notably, progressive plaque within the right vertebral artery at the V3/V4 junction results in up to moderate stenosis. The basilar artery is patent. The posterior cerebral arteries are patent. Hypoplastic left P1 segment with sizable left posterior communicating artery. The right posterior communicating artery is diminutive or absent. Venous sinuses: Within the limitations of contrast timing, no convincing thrombus. Anatomic variants: As described. Review of the MIP images confirms the above findings CT head impression #1 called by telephone at the time of interpretation on 07/23/2024 at 4:52 pm to provider [facility] , who verbally acknowledged these results. IMPRESSION: CT HEAD: 1. Subdural hematomas overlying the bilateral cerebral convexities (measuring up to 8 mm in thickness on the left and 4 mm in thickness on the right), new from the head CT of 07/18/2024. No midline shift. 2. Subarachnoid hemorrhage previously demonstrated within the left Sylvian fissure, and along the left frontoparietal convexity, is no longer present. 3. Background parenchymal atrophy, chronic small vessel ischemic disease and chronic bilateral MCA territory infarcts, as described. CTA NECK: 1. The common carotid and internal carotid arteries are patent within neck. Atherosclerotic plaque  bilaterally, as described. Most notably, predominantly calcified plaque within the proximal left ICA results in 65-70% stenosis (unchanged from the prior CTA of 01/24/2018). Prior right carotid endarterectomy. 2. Vertebral arteries patent within the neck. Progressive atherosclerotic plaque at the right vertebral artery V3/V4 junction results in up to moderate stenosis. 3. Aortic Atherosclerosis (ICD10-I70.0). 4. Bandlike opacity within the right lung apex, similar to the recent prior chest CT of 06/19/2024. Please refer to this prior examination for further description and for recommendations. 5. Aortic Atherosclerosis (ICD10-I70.0). CTA HEAD: 1. No proximal intracranial large vessel occlusion is identified. 2. Intracranial atherosclerotic disease as described. Most notably, progressive plaque within the right vertebral artery at the V3/V4 junction results in up to moderate stenosis. Electronically Signed   By: Rockey Childs D.O.   On: 07/23/2024 16:53       Elgie Butter M.D. [facility] 07/24/2024, 11:39 AM  Available via [employer]  secure chat 7am-7pm After 7 pm, please refer to night coverage provider listed on amion.

## 2024-07-25 DIAGNOSIS — R8290 Unspecified abnormal findings in urine: Secondary | ICD-10-CM

## 2024-07-25 DIAGNOSIS — S065X0A Traumatic subdural hemorrhage without loss of consciousness, initial encounter: Secondary | ICD-10-CM | POA: Diagnosis not present

## 2024-07-25 DIAGNOSIS — S065XAA Traumatic subdural hemorrhage with loss of consciousness status unknown, initial encounter: Secondary | ICD-10-CM

## 2024-07-25 DIAGNOSIS — R531 Weakness: Secondary | ICD-10-CM

## 2024-07-25 DIAGNOSIS — N1831 Chronic kidney disease, stage 3a: Secondary | ICD-10-CM | POA: Diagnosis not present

## 2024-07-25 MED ORDER — SENNOSIDES-DOCUSATE SODIUM 8.6-50 MG PO TABS
1.0000 | ORAL_TABLET | Freq: Every evening | ORAL | Status: AC | PRN
Start: 2024-07-25 — End: ?

## 2024-07-25 NOTE — Progress Notes (Signed)
 Physical Therapy Treatment Patient Details Name: Gregory [name] MRN: [id number] DOB: [birth year] Today's Date: 07/25/2024   History of Present Illness Pt is an 89 y.o. male presenting 8/5 with worsening balance, increased sleeping, confusion after having 2 falls this past week. ED visit at [facility] 7/31 and dx with snall volume SAH within the L sylvian fuissure. Pt with bilateral SDH. PMH significant for CAD s/p CABG, afib, CVA, CKDIIIa, HLD, BPH, CHF, depression/anxiety, bipolar 2, SCC or RUL s/p SBRT.    PT Comments  Pt received in supine and agreeable to session with son present and supportive throughout. Pt requires increased time for problem solving and cues for safety throughout. Pt able to tolerate gait and stair trials with up to min A this session. Pt requires frequent cues for technique and awareness throughout due to decreased carryover. Pt demonstrates increased difficulty with obstacle negotiation requiring dense cues for problem solving and intermittent assist due to pt running into obstacles repeatedly before attempting to navigate around them. Education provided to pt's son on guarding and assist techniques. Pt continues to benefit from PT services to progress toward functional mobility goals.    If plan is discharge home, recommend the following: A little help with walking and/or transfers;A little help with bathing/dressing/bathroom;Assist for transportation;Help with stairs or ramp for entrance   Can travel by private vehicle        Equipment Recommendations  None recommended by PT    Recommendations for Other Services       Precautions / Restrictions Precautions Precautions: Fall Restrictions Weight Bearing Restrictions Per Provider Order: No     Mobility  Bed Mobility Overal bed mobility: Needs Assistance Bed Mobility: Supine to Sit, Sit to Supine     Supine to sit: Min assist, HOB elevated Sit to supine: Contact guard assist   General bed mobility comments:  increased time and assist with bedpad to scoot forward to EOB    Transfers Overall transfer level: Needs assistance Equipment used: Rolling walker (2 wheels) Transfers: Sit to/from Stand Sit to Stand: Min assist, Contact guard assist           General transfer comment: STS from EOB with min A and low toilet seat with CGA. Cues for hand placement    Ambulation/Gait Ambulation/Gait assistance: Contact guard assist, Min assist Gait Distance (Feet): 150 Feet Assistive device: Rolling walker (2 wheels) Gait Pattern/deviations: Step-through pattern, Decreased stride length, Trunk flexed, Drifts right/left       General Gait Details: Frequent cues for RW proximity and pacing due to pt tending to increase pace with progressed distance causing increased instability. Cues for awareness and RW management for obstacle negotiation with intermittent assist to navigate room   Stairs Stairs: Yes Stairs assistance: Min assist Stair Management: Two rails Number of Stairs: 4 General stair comments: Min A for balance due to weakness. Cues for sequencing   Wheelchair Mobility     Tilt Bed    Modified Rankin (Stroke Patients Only) Modified Rankin (Stroke Patients Only) Pre-Morbid Rankin Score: Slight disability Modified Rankin: Moderately severe disability     Balance Overall balance assessment: Needs assistance Sitting-balance support: No upper extremity supported, Feet supported Sitting balance-Leahy Scale: Fair Sitting balance - Comments: sitting EOB   Standing balance support: Bilateral upper extremity supported, During functional activity, Reliant on assistive device for balance Standing balance-Leahy Scale: Poor Standing balance comment: with RW support  Communication Communication Communication: Impaired Factors Affecting Communication: Hearing impaired  Cognition Arousal: Alert Behavior During Therapy: WFL for tasks  assessed/performed   PT - Cognitive impairments: Orientation, Memory, Problem solving, Awareness                         Following commands: Impaired Following commands impaired: Follows one step commands with increased time, Follows multi-step commands inconsistently    Cueing Cueing Techniques: Verbal cues  Exercises Other Exercises Other Exercises: BLE step ups x5 each    General Comments General comments (skin integrity, edema, etc.): son at side and supportive, reviewed fall prevention technqiues and pt will have hands on support at dc      Pertinent Vitals/Pain Pain Assessment Pain Assessment: No/denies pain     PT Goals (current goals can now be found in the care plan section) Acute Rehab PT Goals Patient Stated Goal: go home PT Goal Formulation: With patient/family Time For Goal Achievement: 08/07/24 Progress towards PT goals: Progressing toward goals    Frequency    Min 2X/week       AM-PAC PT 6 Clicks Mobility   Outcome Measure  Help needed turning from your back to your side while in a flat bed without using bedrails?: A Little Help needed moving from lying on your back to sitting on the side of a flat bed without using bedrails?: A Little Help needed moving to and from a bed to a chair (including a wheelchair)?: A Little Help needed standing up from a chair using your arms (e.g., wheelchair or bedside chair)?: A Little Help needed to walk in hospital room?: A Little Help needed climbing 3-5 steps with a railing? : A Little 6 Click Score: 18    End of Session Equipment Utilized During Treatment: Gait belt Activity Tolerance: Patient tolerated treatment well Patient left: in bed;with call bell/phone within reach;with family/visitor present Nurse Communication: Mobility status PT Visit Diagnosis: Unsteadiness on feet (R26.81);Other abnormalities of gait and mobility (R26.89);History of falling (Z91.81)     Time: 8855-8785 PT Time  Calculation (min) (ACUTE ONLY): 30 min  Charges:    $Gait Training: 23-37 mins PT General Charges $$ ACUTE PT VISIT: 1 Visit                     Darryle George, PTA Acute Rehabilitation Services Secure Chat Preferred  [phone]    Darryle George 07/25/2024, 2:23 PM

## 2024-07-25 NOTE — Progress Notes (Signed)
 Occupational Therapy Treatment Patient Details Name: Gregory [name] MRN: [id number] DOB: 1934/11/08 Today's Date: 07/25/2024   History of present illness Pt is an 89 y.o. male presenting 8/5 with worsening balance, increased sleeping, confusion after having 2 falls this past week. ED visit at [facility] 7/31 and dx with snall volume SAH within the L [place] fuissure. Pt with bilateral SDH. PMH significant for CAD s/p CABG, afib, CVA, CKDIIIa, HLD, BPH, CHF, depression/anxiety, bipolar 2, SCC or RUL s/p SBRT.   OT comments  Pt supine in bed and agreeable to OT session.  Patient requires min assist for transfers and mobility in room using RW, cueing for safety and RW mgmt.  Min guard for standing at sink grooming, pt preference to lean on sink.  Pt with poor recall of 2 step task in quiet environment and requires cueing to complete.  Discussed fall prevention with pt and pt reports he will have support during all mobility and Adl tasks at dc. Son present and supportive. Continue to recommend HHOT services at dc.  Will follow acutely.       If plan is discharge home, recommend the following:  A little help with walking and/or transfers;A little help with bathing/dressing/bathroom;Assistance with cooking/housework;Assist for transportation;Help with stairs or ramp for entrance;Direct supervision/assist for medications management;Direct supervision/assist for financial management;Supervision due to cognitive status   Equipment Recommendations  None recommended by OT    Recommendations for Other Services      Precautions / Restrictions Precautions Precautions: Fall Restrictions Weight Bearing Restrictions Per Provider Order: No       Mobility Bed Mobility Overal bed mobility: Needs Assistance Bed Mobility: Supine to Sit, Sit to Supine     Supine to sit: Contact guard Sit to supine: Contact guard assist        Transfers Overall transfer level: Needs assistance Equipment used:  Rolling walker (2 wheels) Transfers: Sit to/from Stand Sit to Stand: Min assist           General transfer comment: Assist for balance, cueing for hand placement     Balance Overall balance assessment: Needs assistance Sitting-balance support: No upper extremity supported, Feet supported Sitting balance-Leahy Scale: Fair     Standing balance support: Bilateral upper extremity supported, No upper extremity supported, During functional activity Standing balance-Leahy Scale: Poor Standing balance comment: UE support, leans on sink during grooming tasks                           ADL either performed or assessed with clinical judgement   ADL Overall ADL's : Needs assistance/impaired     Grooming: Contact guard assist;Standing           Upper Body Dressing : Set up;Sitting   Lower Body Dressing: Minimal assistance;Sit to/from stand   Toilet Transfer: Ambulation;Minimal assistance;Rolling walker (2 wheels)           Functional mobility during ADLs: Minimal assistance;Rolling walker (2 wheels)      Extremity/Trunk Assessment              Vision   Additional Comments: pt locates # of fingers without difficulty, tracks with some decreased attention but functional.  He denies visual changes.   Perception     Praxis     Communication Communication Communication: Impaired Factors Affecting Communication: Hearing impaired   Cognition Arousal: Alert Behavior During Therapy: WFL for tasks assessed/performed Cognition: Cognition impaired     Awareness: Online awareness impaired Memory  impairment (select all impairments): Short-term memory, Working memory Attention impairment (select first level of impairment): Sustained attention Executive functioning impairment (select all impairments): Sequencing, Problem solving OT - Cognition Comments: pt following simple commands with increased time, able to repeat 2 step task after repeating 2x but requires  cueing to complete 2nd task.                 Following commands: Impaired Following commands impaired: Follows one step commands with increased time, Follows multi-step commands inconsistently      Cueing   Cueing Techniques: Verbal cues  Exercises      Shoulder Instructions       General Comments son at side and supportive, reviewed fall prevention technqiues and pt will have hands on support at dc    Pertinent Vitals/ Pain       Pain Assessment Pain Assessment: No/denies pain  Home Living                                          Prior Functioning/Environment              Frequency  Min 2X/week        Progress Toward Goals  OT Goals(current goals can now be found in the care plan section)  Progress towards OT goals: Progressing toward goals  Acute Rehab OT Goals Patient Stated Goal: home OT Goal Formulation: With patient Time For Goal Achievement: 08/07/24 Potential to Achieve Goals: Good  Plan      Co-evaluation                 AM-PAC OT 6 Clicks Daily Activity     Outcome Measure   Help from another person eating meals?: None Help from another person taking care of personal grooming?: A Little Help from another person toileting, which includes using toliet, bedpan, or urinal?: A Little Help from another person bathing (including washing, rinsing, drying)?: A Little Help from another person to put on and taking off regular upper body clothing?: A Little Help from another person to put on and taking off regular lower body clothing?: A Little 6 Click Score: 19    End of Session Equipment Utilized During Treatment: Gait belt;Rolling walker (2 wheels)  OT Visit Diagnosis: Unsteadiness on feet (R26.81);Muscle weakness (generalized) (M62.81)   Activity Tolerance Patient tolerated treatment well   Patient Left in bed;with call bell/phone within reach;with family/visitor present;with bed alarm set   Nurse  Communication Mobility status        Time: 1230-1250 OT Time Calculation (min): 20 min  Charges: OT General Charges $OT Visit: 1 Visit OT Treatments $Self Care/Home Management : 8-22 mins  Etta NOVAK, OT Acute Rehabilitation Services Office [phone] Secure Chat Preferred    Etta GORMAN Hope 07/25/2024, 1:49 PM

## 2024-07-25 NOTE — TOC Transition Note (Signed)
 Transition of Care [facility]) - Discharge Note   Patient Details  Name: Gregory [name] MRN: [id number] Date of Birth: [birth year]  Transition of Care [facility]) CM/SW Contact:  Andrez JULIANNA George, RN Phone Number: 07/25/2024, 10:05 AM   Clinical Narrative:     Pt is discharging home today with home health through [place]. Information on the AVS. Hedda will contact them for the first home visit. Pt has walker/ shower seat at home. Family provides needed transportation.  Spouse over sees his medications at home.  Son will provide transportation home today.  Final next level of care: Home w Home Health Services Barriers to Discharge: No Barriers Identified   Patient Goals and CMS Choice   CMS Medicare.gov Compare Post Acute Care list provided to:: Patient Represenative (must comment) Choice offered to / list presented to : Adult Children      Discharge Placement                       Discharge Plan and Services Additional resources added to the After Visit Summary for                            [facility] Arranged: PT, OT [facility] Agency: [facility] Date [facility] Agency Contacted: 07/25/24   Representative spoke with at [facility] Agency: Darleene  Social Drivers of Health (SDOH) Interventions SDOH Screenings   Food Insecurity: No Food Insecurity (07/24/2024)  Housing: Low Risk  (07/24/2024)  Transportation Needs: No Transportation Needs (07/24/2024)  Utilities: Not At Risk (07/24/2024)  Social Connections: Moderately Integrated (07/24/2024)  Tobacco Use: Medium Risk (07/23/2024)     Readmission Risk Interventions    04/25/2024    1:49 PM 05/28/2023    9:14 AM 05/23/2023    2:30 PM  Readmission Risk Prevention Plan  Transportation Screening Complete  Complete  PCP or Specialist Appt within 5-7 Days Complete    Home Care Screening Complete Complete   Medication Review (RN CM) Complete Complete   HRI or Home Care Consult   Complete  Social Work Consult for Recovery Care Planning/Counseling    Complete  Palliative Care Screening   Not Applicable  Medication Review Oceanographer)   Referral to Pharmacy

## 2024-07-25 NOTE — Progress Notes (Signed)
 D/c tele and iv.  Amado GORMAN Arabia, RN

## 2024-07-26 ENCOUNTER — Other Ambulatory Visit: Payer: Self-pay | Admitting: Internal Medicine

## 2024-07-26 LAB — URINE CULTURE: Culture: >=100000 — AB

## 2024-07-26 MED ORDER — CEPHALEXIN 500 MG PO CAPS: 500.0000 mg | ORAL_CAPSULE | Freq: Two times a day (BID) | ORAL | 0 refills | Status: AC

## 2024-07-26 NOTE — Discharge Summary (Signed)
 Physician Discharge Summary   Patient: Gregory [name] MRN: [id number] DOB: [birth year]  Admit date:     07/23/2024  Discharge date: 07/25/2024  Discharge Physician: Elgie Butter   PCP: Tanda Prentice DEL, MD   Recommendations at discharge:  Please follow up with PCP in one week.  Please follow up with Neurosurgery as scheduled.  Please check a repeat CT head without contrast in 2 weeks  Please hold the aspirin  and plavix . Until the repeat CT in 2 weeks and until cleared by neurosurgery.  Please follow u with repeat liver enzymes in 2 to 4 weeks to evaluate for improvement.  Discharge Diagnoses: Principal Problem:   Subdural hematoma without coma, without loss of consciousness, initial encounter (HCC) Active Problems:   History of CVA (cerebrovascular accident)   Stage 3a chronic kidney disease (CKD) (HCC)   Coronary artery disease involving native coronary artery of native heart with unstable angina pectoris (HCC)   Hyperlipidemia   Abnormal urinalysis   Elevated LFTs    Hospital Course: Gregory [name] is a 89 y.o. 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  Assessment and Plan:   Subdural hematomas CT head on admission showed new SDH overlying bilateral, cerebral convexities measuring up to 8 mm on the left and 4 mm on the right. Repeat CT this morning did not show worsening of the subdural hematoma. Neurosurgery consulted recommended holding all anticoagulation and blood thinners for at least 2 weeks.  Recommended outpatient follow-up with the CT head. PT/OT evaluations ordered.  Fall precautions in place.       Abnormal urine analysis. Urine cultures pending, patient received a dose of IV ceftriaxone  in the ED. Will call in for keflex  .         Generalized weakness with functional decline Going on for about a month. Patient is at high risk for developing vascular dementia due to his recent history of CVA, subarachnoid hemorrhage and subdural hematoma. Recommend outpatient follow-up with neurology.       History of coronary artery disease s/p CABG No chest pain.  Holding aspirin  and Plavix  due to recent [facility] Patient reports that they were holding amlodipine .        Sinus bradycardia History of postoperative atrial fibrillation Patient continues to be bradycardic but asymptomatic.  His HR in 50/min.  His amlodipine  was held .  Continue with home medications.        Elevated liver enzymes Statin on hold Right upper quadrant ultrasound showed Hepatic steatosis. Gregory [name]     History of CVA: He has residual mild aphasia from prior stroke.  Holding aspirin , Plavix , statin as above for 2 weeks until cleared by neurosurgery.    CKD stage IIIa: Renal function stable.   Hyperlipidemia:    Hypothyroidism: Continue Synthroid  25 mcg.   Depression/anxiety/bipolar 2 disorder: Continue Zoloft , Remeron , Xanax .   BPH: Continue Flomax .   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  Follows with radiation/oncology for active surveillance.   Estimated body mass index is 25.5 kg/m as calculated from the following:   Height as of this encounter: 6' (1.829 m).   Weight as of this encounter: 85.3 kg.       Consultants: neurosurgery.  Procedures performed: none.   Disposition: Home Diet recommendation:  Discharge  Diet Orders (From admission, onward)     Start     Ordered   07/25/24 0000  Diet - low sodium heart healthy        07/25/24 1233           Regular diet DISCHARGE MEDICATION: Allergies as of 07/25/2024   No Known Allergies      Medication List     PAUSE taking these medications    aspirin  EC 81 MG tablet Wait to take this until your doctor or other care provider tells you to start  again. Take 81 mg by mouth daily. Swallow whole.   clopidogrel  75 MG tablet Wait to take this until your doctor or other care provider tells you to start again. Commonly known as: PLAVIX  Take 75 mg by mouth daily.       TAKE these medications    ALPRAZolam  0.5 MG tablet Commonly known as: XANAX  Take 1 mg by mouth at bedtime.   amiodarone  200 MG tablet Commonly known as: PACERONE  Take 1 tablet (200 mg total) by mouth daily. What changed: when to take this   amLODipine  5 MG tablet Commonly known as: NORVASC  Take 5 mg by mouth daily.   atorvastatin  80 MG tablet Commonly known as: LIPITOR  Take 1 tablet (80 mg total) by mouth daily.   CENTRUM ADULTS PO Take 1 tablet by mouth daily.   ferrous sulfate  325 (65 FE) MG EC tablet Take 1 tablet (325 mg total) by mouth daily with breakfast.   FISH OIL PO Take 1 capsule by mouth daily.   isosorbide  mononitrate 30 MG 24 hr tablet Commonly known as: IMDUR  TAKE 1/2 OF A TABLET (15 MG TOTAL) BY MOUTH DAILY What changed: See the new instructions.   levothyroxine  25 MCG tablet Commonly known as: SYNTHROID  Take 25 mcg by mouth daily before breakfast.   metoprolol  tartrate 25 MG tablet Commonly known as: LOPRESSOR  TAKE 1 TABLET BY MOUTH TWICE A DAY   mirtazapine  7.5 MG tablet Commonly known as: REMERON  TAKE 1 TABLET(7.5 MG) BY MOUTH AT BEDTIME   nitroGLYCERIN  0.4 MG SL tablet Commonly known as: NITROSTAT  Place 0.4 mg under the tongue as needed for chest pain.   omeprazole  20 MG capsule Commonly known as: PRILOSEC Take 1 capsule (20 mg total) by mouth every other day.   senna-docusate 8.6-50 MG tablet Commonly known as: Senokot-S Take 1 tablet by mouth at bedtime as needed for mild constipation.   sertraline  50 MG tablet Commonly known as: ZOLOFT  TAKE 3 TABLETS(150 MG) BY MOUTH DAILY   tamsulosin  0.4 MG Caps capsule Commonly known as: FLOMAX  Take 0.4 mg by mouth at bedtime.   ZINC PO Take 1 capsule by mouth daily  at 12 noon.        Follow-up Information     Darnella Dorn SAUNDERS, MD. Call in 2 week(s).   Specialty: Neurosurgery Why: Please call to schedule a repeat noncontrast CT scan of the head in 2 weeks.  Thank you Contact information: [address], [address] [place] [address] [phone]         Care, [facility] Follow up.   Specialty: Home Health Services Why: Hedda will contact you for the first home visit Contact information: [address] [place] [address] [phone]         Tanda Prentice DEL, MD. Schedule an appointment as soon as possible for a visit in 1 week(s).   Specialty: Family Medicine Contact information: [address]  [place] [address] [phone]                Discharge Exam: Fredricka Weights   07/23/24 1359  Weight: 85.3 kg   General exam: Appears calm and comfortable  Respiratory system: Clear to auscultation. Respiratory effort normal. Cardiovascular system: S1 & S2 heard, RRR. No JVD,  Gastrointestinal system: Abdomen is nondistended, soft and nontender.  Central nervous system: Alert and oriented.  Extremities: Symmetric 5 x 5 power. Skin: No rashes,  Psychiatry: Mood & affect appropriate.    Condition at discharge: fair  The results of significant diagnostics from this hospitalization (including imaging, microbiology, ancillary and laboratory) are listed below for reference.   Imaging Studies: CT HEAD WO CONTRAST ( ) Result Date: 07/24/2024 CLINICAL DATA:  Head trauma, minor (Age >= 65y) EXAM: CT HEAD WITHOUT CONTRAST TECHNIQUE: Contiguous axial images were obtained from the base of the skull through the vertex without intravenous contrast. RADIATION DOSE REDUCTION: This exam was performed according to the departmental dose-optimization program which includes automated exposure control, adjustment of the mA and/or kV according to patient size and/or use of iterative reconstruction technique.  COMPARISON:  CT head/neck 07/23/2024. FINDINGS: Brain: No substantial change in bilateral subdural hematomas. No mass effect. No evidence of acute large vascular territory infarct. Similar right frontal encephalomalacia. No hydrocephalus, mass lesion or midline shift. Cerebral atrophy. Vascular: No hyperdense vessel.  Calcific atherosclerosis. Skull: No acute fracture. Sinuses/Orbits: Clear sinuses.  No acute orbital findings. Other: No mastoid effusions. IMPRESSION: No substantial change in bilateral subdural hematomas, 8 mm in thickness on the left and 4 mm in thickness on the right. No midline shift. Electronically Signed   By: Gilmore GORMAN Molt M.D.   On: 07/24/2024 04:06   US  Abdomen Limited RUQ (LIVER/GB) Result Date: 07/23/2024 EXAM: Right Upper Quadrant Abdominal Ultrasound 07/23/2024 10:59:49 PM TECHNIQUE: Real-time ultrasonography of the right upper quadrant of the abdomen was performed. COMPARISON: None available. CLINICAL HISTORY: 221910 Elevated LFTs 221910. Elevated LFTs. FINDINGS: LIVER: The liver demonstratesincreased echogenicity. No intrahepatic biliary ductal dilatation. No evidence of mass. BILIARY SYSTEM: No pericholecystic fluid or wall thickening. No cholelithiasis. Negative sonographic Murphy's sign. Common bile duct is within normal limits measuring 6mm. RIGHT KIDNEY: The right kidney is grossly unremarkable in appearances without evidence of hydronephrosis, echogenic calculi or worrisome mass lesions. OTHER: Trace right pleural effusion. IMPRESSION: 1. Hepatic steatosis. 2. Trace right pleural effusion. Electronically signed by: Norman Gatlin MD 07/23/2024 11:15 PM EDT RP Workstation: [id number]   CT ANGIO HEAD NECK W WO CM Result Date: 07/23/2024 CLINICAL DATA:  Provided history: Vertigo, central. EXAM: CT ANGIOGRAPHY HEAD AND NECK WITH AND WITHOUT CONTRAST TECHNIQUE: Multidetector CT imaging of the head and neck was performed using the standard protocol during bolus administration of  intravenous contrast. Multiplanar CT image reconstructions and MIPs were obtained to evaluate the vascular anatomy. Carotid stenosis measurements (when applicable) are obtained utilizing NASCET criteria, using the distal internal carotid diameter as the denominator. RADIATION DOSE REDUCTION: This exam was performed according to the departmental dose-optimization program which includes automated exposure control, adjustment of the mA and/or kV according to patient size and/or use of iterative reconstruction technique. CONTRAST:  75mL OMNIPAQUE  IOHEXOL  350 MG/ML SOLN COMPARISON:  Head CT 07/18/2024. CT angiogram head/neck 01/24/2018. Chest CT 06/19/2024. FINDINGS: CT HEAD FINDINGS Brain: Generalized cerebral atrophy. Subarachnoid hemorrhage previously demonstrated within the left sylvian hip fissure and along the left frontoparietal convexity is no longer present. However new from the prior head CT of 07/18/2024, there  are subdural hematomas overlying the bilateral cerebral convexities (measuring up to 8 mm in thickness on the left and 4 mm in thickness on the right). No midline shift. Known chronic cortical/subcortical right MCA territory infarcts within the frontal and parietal lobes. Redemonstrated small chronic cortical/subcortical infarct at the left temporoparietal junction (MCA territory). Background mild patchy and ill-defined hypoattenuation within the cerebral white matter, nonspecific but compatible with chronic small vessel ischemic disease. No demarcated cortical infarct. No evidence of an intracranial mass. No midline shift. Vascular: No hyperdense vessel.  Atherosclerotic calcifications. Skull: No calvarial fracture or aggressive osseous lesion. Sinuses/Orbits: No mass or acute finding within the imaged orbits. Minimal mucosal thickening within the bilateral maxillary sinuses. Review of the MIP images confirms the above findings CTA NECK FINDINGS Aortic arch: Standard aortic branching. Atherosclerotic  plaque within the aortic arch and proximal major branch vessels of the neck. Streak/beam hardening artifact arising from a dense contrast bolus partially obscures the left subclavian artery. Within this limitation, there is no appreciable hemodynamically significant innominate or proximal subclavian artery stenosis. Right carotid system: Prior right carotid endarterectomy. CCA and ICA patent within the neck without stenosis. Atherosclerotic plaque at the CCA origin, within the distal CCA, but the carotid bifurcation and within the proximal ICA. Left carotid system: CCA and ICA patent within the neck. Atherosclerotic plaque scattered within the CCA, but the carotid bifurcation and scattered within the ICA. Most notably, predominantly calcified atherosclerotic plaque at the ICA origin results in 65-70% stenosis (unchanged from the prior CTA of 01/24/2018). Vertebral arteries: Codominant and patent within the neck. Atherosclerotic plaque within both vessels. Most notably, progressive sclerotic plaque within the right vertebral artery V3/V4 junction results in up to moderate stenosis. Skeleton: C3-C4 vertebral ankylosis. Cervical spondylosis. Disc space narrowing is advanced at C5-C6 and C6-C7. Grade 1 anterolisthesis at C7-T1, T1-T2 and T2-T3. Dextrocurvature of the upper/mid thoracic spine, partially imaged. Other neck: No neck mass or cervical lymphadenopathy. Upper chest: Bandlike opacity within the right upper lobe, similar to the recent prior chest CT of 06/19/2024. Prior median sternotomy. Review of the MIP images confirms the above findings CTA HEAD FINDINGS Anterior circulation: The intracranial internal carotid arteries are patent. Calcified atherosclerotic plaque within both vessels resulting in no more than mild stenosis. The M1 middle cerebral arteries are patent. No M2 proximal branch occlusion or high-grade proximal stenosis. The anterior cerebral arteries are patent. No intracranial aneurysm is  identified. Posterior circulation: The intracranial vertebral arteries are patent. Atherosclerotic plaque within both vessels. Most notably, progressive plaque within the right vertebral artery at the V3/V4 junction results in up to moderate stenosis. The basilar artery is patent. The posterior cerebral arteries are patent. Hypoplastic left P1 segment with sizable left posterior communicating artery. The right posterior communicating artery is diminutive or absent. Venous sinuses: Within the limitations of contrast timing, no convincing thrombus. Anatomic variants: As described. Review of the MIP images confirms the above findings CT head impression #1 called by telephone at the time of interpretation on 07/23/2024 at 4:52 pm to provider [facility] , who verbally acknowledged these results. IMPRESSION: CT HEAD: 1. Subdural hematomas overlying the bilateral cerebral convexities (measuring up to 8 mm in thickness on the left and 4 mm in thickness on the right), new from the head CT of 07/18/2024. No midline shift. 2. Subarachnoid hemorrhage previously demonstrated within the left Sylvian fissure, and along the left frontoparietal convexity, is no longer present. 3. Background parenchymal atrophy, chronic small vessel ischemic disease and chronic bilateral  MCA territory infarcts, as described. CTA NECK: 1. The common carotid and internal carotid arteries are patent within neck. Atherosclerotic plaque bilaterally, as described. Most notably, predominantly calcified plaque within the proximal left ICA results in 65-70% stenosis (unchanged from the prior CTA of 01/24/2018). Prior right carotid endarterectomy. 2. Vertebral arteries patent within the neck. Progressive atherosclerotic plaque at the right vertebral artery V3/V4 junction results in up to moderate stenosis. 3. Aortic Atherosclerosis (ICD10-I70.0). 4. Bandlike opacity within the right lung apex, similar to the recent prior chest CT of 06/19/2024. Please refer  to this prior examination for further description and for recommendations. 5. Aortic Atherosclerosis (ICD10-I70.0). CTA HEAD: 1. No proximal intracranial large vessel occlusion is identified. 2. Intracranial atherosclerotic disease as described. Most notably, progressive plaque within the right vertebral artery at the V3/V4 junction results in up to moderate stenosis. Electronically Signed   By: Rockey Childs D.O.   On: 07/23/2024 16:53   CT Head Wo Contrast Result Date: 07/18/2024 CLINICAL DATA:  Subarachnoid hemorrhage, follow-up examination EXAM: CT HEAD WITHOUT CONTRAST TECHNIQUE: Contiguous axial images were obtained from the base of the skull through the vertex without intravenous contrast. RADIATION DOSE REDUCTION: This exam was performed according to the departmental dose-optimization program which includes automated exposure control, adjustment of the mA and/or kV according to patient size and/or use of iterative reconstruction technique. COMPARISON:  3:23 p.m. FINDINGS: Brain: Small volume subarachnoid hemorrhage within the left sylvian fissure and more superior Rolandic sulcus appears stable since prior examination. No interval hemorrhage. Parenchymal volume loss is stable and commensurate with the patient's age. Right frontal cortical and right cerebellar encephalomalacia again noted. No abnormal mass effect or midline shift. No acute infarct. Ventricular size is normal. Vascular: Extensive vascular calcification with carotid siphons. No asymmetric hyperdense vasculature at the skull base Skull: Normal. Negative for fracture or focal lesion. Sinuses/Orbits: No acute finding. Other: Mastoid air cells and middle ear cavities are clear. Small right frontal scalp hematoma IMPRESSION: 1. Stable small volume subarachnoid hemorrhage within the left Sylvian fissure and more superior Rolandic sulcus. No interval hemorrhage. 2. Stable senescent change and remote right frontal and right cerebellar infarcts. 3.  Small right frontal scalp hematoma. Electronically Signed   By: Dorethia Molt M.D.   On: 07/18/2024 22:31   CT Cervical Spine Wo Contrast Result Date: 07/18/2024 CLINICAL DATA:  Neck trauma, fall, syncope. EXAM: CT CERVICAL SPINE WITHOUT CONTRAST TECHNIQUE: Multidetector CT imaging of the cervical spine was performed without intravenous contrast. Multiplanar CT image reconstructions were also generated. RADIATION DOSE REDUCTION: This exam was performed according to the departmental dose-optimization program which includes automated exposure control, adjustment of the mA and/or kV according to patient size and/or use of iterative reconstruction technique. COMPARISON:  None Available. FINDINGS: Despite efforts by the technologist and patient, motion artifact is present on today's exam and could not be eliminated. This reduces exam sensitivity and specificity. Alignment: Up to 4 mm anterolisthesis at C3-4 although admittedly there is extensive motion artifact in this region which might skew the appearance, and this subluxation is not readily apparent on the scout image. 3 mm degenerative anterolisthesis at C7-T1. Skull base and vertebrae: Substantial anterior spurring at C1-2. Fused facet joints at C3-4. Substantial pseudo articulating anterior osteophyte at C5-6. No definite fracture is identified. Motion artifact especially blurs the C4 and C5 levels. Soft tissues and spinal canal: Bilateral common carotid atheromatous vascular calcifications. Mild benign retro odontoid pseudotumor/pannus. Disc levels: Suspected right foraminal impingement at C4-5, C5-6, and C6-7 and  suspected left foraminal impingement potentially at C3-4 and C6-7 due to uncinate and facet spurring. Upper chest: Mild biapical pleuroparenchymal scarring Other: No supplemental non-categorized findings. IMPRESSION: 1. No definite fracture is identified. 2. Up to 4 mm anterolisthesis at C3-4 although admittedly there is extensive motion artifact in  this region which might skew the appearance, and this subluxation is not readily apparent on the scout image. 3. 3 mm degenerative anterolisthesis at C7-T1. 4. Suspected right foraminal impingement at C4-5, C5-6, and C6-7 and suspected left foraminal impingement potentially at C3-4 and C6-7 due to uncinate and facet spurring. 5. Bilateral common carotid atheromatous vascular calcifications. Electronically Signed   By: Ryan Salvage M.D.   On: 07/18/2024 15:57   CT Head Wo Contrast Result Date: 07/18/2024 EXAM: CT HEAD AND CERVICAL SPINE 07/18/2024 03:34:10 PM TECHNIQUE: CT of the head and cervical spine was performed without the administration of intravenous contrast. Multiplanar reformatted images are provided for review. Automated exposure control, iterative reconstruction, and/or weight based adjustment of the mA/kV was utilized to reduce the radiation dose to as low as reasonably achievable. COMPARISON: None available. CLINICAL HISTORY: Head trauma, minor (Age >= 65y). Pt BIBEMS status post syncope/fall. Normally A\T\Ox4, A\T\Ox2 on EMS arrival to pt. In c collar. On blood thinners. EMS reports baseline slurred speech. SB for duration of EMS trip. FINDINGS: CT HEAD BRAIN AND VENTRICLES: Acute subarachnoid hemorrhage is noted overlying the left frontoparietal lobe and extending into the posterior and inferior aspect of the sylvian fissure, image 17/2 and image 22/2. Right frontal lobe encephalomalacia compatible with remote cortical infarct. Hypoattenuating foci in the cerebral white matter, most likely representing chronic small vessel disease. Prominence of the sulci and ventricles compatible with brain atrophy. ORBITS: No acute abnormality. SINUSES AND MASTOIDS: No acute abnormality. SOFT TISSUES AND SKULL: Small right scalp hematoma overlying the frontoparietal bone measures 5 mm in thickness, image 18/2. No acute skull fracture. CT CERVICAL SPINE BONES AND ALIGNMENT: No signs of acute fracture or  subluxation. The vertebral body heights are well maintained. Anterolisthesis anteroposterior diameter of the spinal canal at the level of C3 on C4 measures 4 mm sagittal image 33. This is most likely due to chronic cervical spondylosis. No signs of acute posttraumatic malalignment of the cervical spine. DEGENERATIVE CHANGES: Moderate-to-severe cervical spondylosis with multilevel disc space narrowing and endplate spurring. Bilateral facet arthropathy. SOFT TISSUES: No prevertebral soft tissue swelling. VASCULATURE: Bilateral carotid artery calcifications. IMPRESSION: 1. Acute subarachnoid hemorrhage overlying the left frontoparietal lobe and extending into the posterior and inferior aspect of the sylvian fissure. 2. Right frontal lobe encephalomalacia compatible with remote cortical infarct. 3. Small right scalp hematoma overlying the frontoparietal bone, measuring 5 mm in thickness. No acute skull fracture. 4. Moderate-to-severe cervical spondylosis with multilevel disc space narrowing, endplate spurring, and bilateral facet arthropathy. No acute fracture or traumatic malalignment of the cervical spine. 5. Critical value was called to the ordering physician, Glendia Breeding at the time of interpretation on 07/18/2024 at 3:23 pm Electronically signed by: Waddell Calk MD 07/18/2024 03:57 PM EDT RP Workstation: [id number]    Microbiology: Results for orders placed or performed during the hospital encounter of 07/23/24  Urine Culture     Status: Abnormal   Collection Time: 07/23/24  7:07 PM   Specimen: Urine, Clean Catch  Result Value Ref Range Status   Specimen Description URINE, CLEAN CATCH  Final   Special Requests   Final    NONE Performed at [facility], [address],  [place], [place] [address]    Culture >=100,000 COLONIES/mL ESCHERICHIA COLI (A)  Final   Report Status 07/26/2024 FINAL  Final   Organism ID, Bacteria ESCHERICHIA COLI (A)  Final      Susceptibility   Escherichia coli -  MIC*    AMPICILLIN <=2 SENSITIVE Sensitive     CEFAZOLIN  <=4 SENSITIVE Sensitive     CEFEPIME <=0.12 SENSITIVE Sensitive     CEFTRIAXONE  <=0.25 SENSITIVE Sensitive     CIPROFLOXACIN <=0.25 SENSITIVE Sensitive     GENTAMICIN <=1 SENSITIVE Sensitive     IMIPENEM <=0.25 SENSITIVE Sensitive     NITROFURANTOIN <=16 SENSITIVE Sensitive     TRIMETH/SULFA <=20 SENSITIVE Sensitive     AMPICILLIN/SULBACTAM <=2 SENSITIVE Sensitive     PIP/TAZO <=4 SENSITIVE Sensitive ug/mL    * >=100,000 COLONIES/mL ESCHERICHIA COLI    Labs: CBC: Recent Labs  Lab 07/23/24 1443 07/24/24 0424  WBC 6.6 6.6  HGB 12.8* 12.6*  HCT 40.7 38.6*  MCV 98.1 96.5  PLT 159 134*   Basic Metabolic Panel: Recent Labs  Lab 07/23/24 1443 07/24/24 0424  NA 137 137  K 4.3 4.0  CL 106 107  CO2 24 22  GLUCOSE 126* 103*  BUN 17 15  CREATININE 1.28* 1.17  CALCIUM  9.2 9.0   Liver Function Tests: Recent Labs  Lab 07/23/24 1443 07/24/24 0424  AST 99* 117*  ALT 64* 64*  ALKPHOS 156* 114  BILITOT 0.8 1.3*  PROT 7.1 6.7  ALBUMIN  3.0* 2.8*   CBG: No results for input(s): GLUCAP in the last 168 hours.  Discharge time spent: 42 minutes.   Signed: Anacleto Batterman, MD [facility]

## 2024-07-26 NOTE — Progress Notes (Signed)
 [facility] Transitional Care Management     Situation   Gregory [name] is a 89 y.o. male who was contacted today for a transitional care outreach.  Admission Date: 07/23/2024  Discharge Date:07/25/2024   Institution: [place]  Diagnosis:  Confusion  Is this visit eligible for TCM? Yes  Background   Since Discharge: HN phone call with pt's son, [name]. Son was wanting to know when [facility] would contact them. HN was able to give him the number to [facility]. Pt's son does not want an appt with PCP at this time. He would prefer to get the home PT started so that pt will be stronger. They will be f/u with CT scan in two weeks and Neurology. Pt is doing well today and he is eating and drinking. HN explained outreach and will continue to f/u and son is agreeable.   Primary Care Provider on Record: Elberta Ebb Cone, NP   Assessment    General Assessment     Type of Visit Telephone   Assessment Completed With Family   Interpreter Used No   Preferred Language English   Living Arrangement Spouse   Support System Spouse   Type of Residence Private residence   Home Care Services Yes * PT and OT   Does Patient Have DME Yes   DME List Walker   Does Patient Need DME No   Bed or Wheelchair Confined No   Fall History Yes   Diet Regular   Inadequate Nutrition No   Medication Adherence Problem No   Medication Side Effects No   Difficulty Keeping Appointments No           Recommendation    PCP/specialist notified: Yes  Referral Made: No  Referrals made to other disciplines: None   Future Appointments  Date Time Provider Department Center  09/16/2024  2:00 PM Elberta Ebb Cone, NP [facility] [address]      Augustin Saupe, RN, BSN Health Navigator [facility] [facility] [employer] [phone]    Electronically signed by: Augustin Saupe, RN 07/26/2024 10:02 AM    *Some images could not be shown.

## 2024-07-26 NOTE — Progress Notes (Signed)
 Urine cultures growing E coli.  Keflex  sent to the pharmacy.

## 2024-07-30 ENCOUNTER — Other Ambulatory Visit: Payer: Self-pay

## 2024-07-30 DIAGNOSIS — I6203 Nontraumatic chronic subdural hemorrhage: Secondary | ICD-10-CM

## 2024-07-30 MED ORDER — AMIODARONE HCL 200 MG PO TABS: 200.0000 mg | ORAL_TABLET | Freq: Every day | ORAL | 3 refills | Status: AC

## 2024-08-05 ENCOUNTER — Encounter: Payer: Self-pay | Admitting: Radiation Oncology

## 2024-08-08 ENCOUNTER — Other Ambulatory Visit: Payer: Self-pay

## 2024-08-08 ENCOUNTER — Telehealth: Payer: Self-pay | Admitting: Cardiology

## 2024-08-08 ENCOUNTER — Ambulatory Visit
Admission: RE | Admit: 2024-08-08 | Discharge: 2024-08-08 | Disposition: A | Discharge status: Home / Self Care | Source: Ambulatory Visit | Attending: Radiation Oncology | Admitting: Radiation Oncology

## 2024-08-08 ENCOUNTER — Encounter: Payer: Self-pay | Admitting: Radiology

## 2024-08-08 DIAGNOSIS — C3411 Malignant neoplasm of upper lobe, right bronchus or lung: Secondary | ICD-10-CM

## 2024-08-08 DIAGNOSIS — I6203 Nontraumatic chronic subdural hemorrhage: Secondary | ICD-10-CM

## 2024-08-08 MED ORDER — IOPAMIDOL (ISOVUE-300) INJECTION 61%
75.0000 mL | Freq: Once | INTRAVENOUS | Status: AC | PRN
Start: 2024-08-08 — End: 2024-08-08
  Administered 2024-08-08: 75 mL via INTRAVENOUS

## 2024-08-08 NOTE — Telephone Encounter (Signed)
 Spoke with pt OT who stated that the pt's HR was 47 and BP was 120/70. Patient was asymptomatic. Pt's HR has been in the low 50s consistently. Pt currently on Lopressor  25 mg BID and did take the AM dose today. Pt of Dr. Lavona- Dr. Lavona advised for pt to discontinue Lopressor . Medication has been stopped and removed from medication list. Left detailed message on secure VM line for OT Hildegarde Louder with [facility]) so they are aware of changes being made for their notation purposes. Spoke with pt's son and explained the changes that were needing to be made with discontinuing Lopressor . Pt's son verbalized understanding of information and had no further questions at this time.

## 2024-08-08 NOTE — Telephone Encounter (Signed)
 STAT if HR is under 50 or over 120  (normal HR is 60-100 beats per minute)  What is your heart rate? 45 currently   Do you have a log of your heart rate readings (document readings)? States its been ranging between 40-50   Do you have any other symptoms? No

## 2024-08-09 ENCOUNTER — Telehealth: Payer: Self-pay | Admitting: *Deleted

## 2024-08-09 ENCOUNTER — Ambulatory Visit
Admission: RE | Admit: 2024-08-09 | Discharge: 2024-08-09 | Disposition: A | Discharge status: Home / Self Care | Source: Ambulatory Visit

## 2024-08-09 DIAGNOSIS — I6203 Nontraumatic chronic subdural hemorrhage: Secondary | ICD-10-CM

## 2024-08-09 NOTE — Telephone Encounter (Signed)
 Spoke with the patient regarding his mychart message.  I let him know that the PA has looked at his scan and everything looks stable.  He verbalized understanding and was appreciative of the call.  Sharene EDISON RN, BSN

## 2024-08-12 ENCOUNTER — Other Ambulatory Visit: Payer: Self-pay | Admitting: Radiation Oncology

## 2024-08-12 DIAGNOSIS — C3411 Malignant neoplasm of upper lobe, right bronchus or lung: Secondary | ICD-10-CM

## 2024-08-13 ENCOUNTER — Telehealth: Payer: Self-pay | Admitting: Cardiology

## 2024-08-13 NOTE — Telephone Encounter (Signed)
 STAT if HR is under 50 or over 120 (normal HR is 60-100 beats per minute)  What is your heart rate?   48  Do you have a log of your heart rate readings (document readings)?   8/25 - 53 at 11:59 am 8/21 - 57 at 2:17 pm (standing) 8/21 - 45 at 1:44 pm (sitting  Do you have any other symptoms?  No  Caller Hildegarde) stated patient HR is still running high with no complaints of dizziness.  Caller noted patient BP was 118/70.

## 2024-08-13 NOTE — Telephone Encounter (Signed)
 Spoke to both Todd w/ OT and son.  Advised to stop Lopressor  completely (they reported pt was still taking an evening dose), per Dr. Lavona.  Advised to call back if no improvement in HRs over the next couple weeks and/or symptoms occur.   Son agreeable to plan.

## 2024-08-14 NOTE — Telephone Encounter (Signed)
 Don with Occupational Therapy  with [facility] needs a verbal for speech therapy evaluation.  [phone] - leave a voice mail if no answer.

## 2024-08-14 NOTE — Telephone Encounter (Signed)
Verbal approval given. 

## 2024-09-06 ENCOUNTER — Emergency Department (HOSPITAL_BASED_OUTPATIENT_CLINIC_OR_DEPARTMENT_OTHER)

## 2024-09-06 ENCOUNTER — Other Ambulatory Visit: Payer: Self-pay

## 2024-09-06 ENCOUNTER — Emergency Department (HOSPITAL_BASED_OUTPATIENT_CLINIC_OR_DEPARTMENT_OTHER): Admitting: Radiology

## 2024-09-06 ENCOUNTER — Encounter (HOSPITAL_BASED_OUTPATIENT_CLINIC_OR_DEPARTMENT_OTHER): Payer: Self-pay

## 2024-09-06 ENCOUNTER — Inpatient Hospital Stay (HOSPITAL_BASED_OUTPATIENT_CLINIC_OR_DEPARTMENT_OTHER)
Admission: EM | Admit: 2024-09-06 | Discharge: 2024-09-11 | DRG: 442 | Disposition: A | Discharge status: Skilled Nursing Facility | Attending: Internal Medicine | Admitting: Internal Medicine

## 2024-09-06 DIAGNOSIS — K5900 Constipation, unspecified: Secondary | ICD-10-CM | POA: Diagnosis present

## 2024-09-06 DIAGNOSIS — K7682 Hepatic encephalopathy: Principal | ICD-10-CM | POA: Diagnosis present

## 2024-09-06 DIAGNOSIS — E1151 Type 2 diabetes mellitus with diabetic peripheral angiopathy without gangrene: Secondary | ICD-10-CM | POA: Diagnosis present

## 2024-09-06 DIAGNOSIS — G9340 Encephalopathy, unspecified: Secondary | ICD-10-CM | POA: Diagnosis not present

## 2024-09-06 DIAGNOSIS — I130 Hypertensive heart and chronic kidney disease with heart failure and stage 1 through stage 4 chronic kidney disease, or unspecified chronic kidney disease: Secondary | ICD-10-CM | POA: Diagnosis present

## 2024-09-06 DIAGNOSIS — L89152 Pressure ulcer of sacral region, stage 2: Secondary | ICD-10-CM | POA: Diagnosis present

## 2024-09-06 DIAGNOSIS — K760 Fatty (change of) liver, not elsewhere classified: Secondary | ICD-10-CM | POA: Diagnosis present

## 2024-09-06 DIAGNOSIS — I739 Peripheral vascular disease, unspecified: Secondary | ICD-10-CM | POA: Diagnosis present

## 2024-09-06 DIAGNOSIS — E785 Hyperlipidemia, unspecified: Secondary | ICD-10-CM | POA: Diagnosis present

## 2024-09-06 DIAGNOSIS — I2510 Atherosclerotic heart disease of native coronary artery without angina pectoris: Secondary | ICD-10-CM | POA: Diagnosis present

## 2024-09-06 DIAGNOSIS — I482 Chronic atrial fibrillation, unspecified: Secondary | ICD-10-CM | POA: Diagnosis present

## 2024-09-06 DIAGNOSIS — N481 Balanitis: Secondary | ICD-10-CM | POA: Diagnosis present

## 2024-09-06 DIAGNOSIS — R531 Weakness: Secondary | ICD-10-CM

## 2024-09-06 DIAGNOSIS — N400 Enlarged prostate without lower urinary tract symptoms: Secondary | ICD-10-CM | POA: Diagnosis present

## 2024-09-06 DIAGNOSIS — I252 Old myocardial infarction: Secondary | ICD-10-CM

## 2024-09-06 DIAGNOSIS — F411 Generalized anxiety disorder: Secondary | ICD-10-CM | POA: Diagnosis present

## 2024-09-06 DIAGNOSIS — Z79899 Other long term (current) drug therapy: Secondary | ICD-10-CM

## 2024-09-06 DIAGNOSIS — I714 Abdominal aortic aneurysm, without rupture, unspecified: Secondary | ICD-10-CM | POA: Diagnosis present

## 2024-09-06 DIAGNOSIS — N1831 Chronic kidney disease, stage 3a: Secondary | ICD-10-CM | POA: Diagnosis present

## 2024-09-06 DIAGNOSIS — I779 Disorder of arteries and arterioles, unspecified: Secondary | ICD-10-CM | POA: Diagnosis present

## 2024-09-06 DIAGNOSIS — F319 Bipolar disorder, unspecified: Secondary | ICD-10-CM | POA: Diagnosis present

## 2024-09-06 DIAGNOSIS — E0781 Sick-euthyroid syndrome: Secondary | ICD-10-CM | POA: Diagnosis present

## 2024-09-06 DIAGNOSIS — E1165 Type 2 diabetes mellitus with hyperglycemia: Secondary | ICD-10-CM | POA: Diagnosis present

## 2024-09-06 DIAGNOSIS — Z1152 Encounter for screening for COVID-19: Secondary | ICD-10-CM

## 2024-09-06 DIAGNOSIS — E1122 Type 2 diabetes mellitus with diabetic chronic kidney disease: Secondary | ICD-10-CM | POA: Diagnosis present

## 2024-09-06 DIAGNOSIS — Z8673 Personal history of transient ischemic attack (TIA), and cerebral infarction without residual deficits: Secondary | ICD-10-CM

## 2024-09-06 DIAGNOSIS — I10 Essential (primary) hypertension: Secondary | ICD-10-CM | POA: Diagnosis present

## 2024-09-06 DIAGNOSIS — I5032 Chronic diastolic (congestive) heart failure: Secondary | ICD-10-CM | POA: Diagnosis present

## 2024-09-06 DIAGNOSIS — Z803 Family history of malignant neoplasm of breast: Secondary | ICD-10-CM

## 2024-09-06 DIAGNOSIS — Z79890 Hormone replacement therapy (postmenopausal): Secondary | ICD-10-CM

## 2024-09-06 DIAGNOSIS — N471 Phimosis: Secondary | ICD-10-CM | POA: Diagnosis present

## 2024-09-06 DIAGNOSIS — F0394 Unspecified dementia, unspecified severity, with anxiety: Secondary | ICD-10-CM | POA: Diagnosis present

## 2024-09-06 DIAGNOSIS — Z7982 Long term (current) use of aspirin: Secondary | ICD-10-CM

## 2024-09-06 DIAGNOSIS — Z8249 Family history of ischemic heart disease and other diseases of the circulatory system: Secondary | ICD-10-CM

## 2024-09-06 DIAGNOSIS — E7220 Disorder of urea cycle metabolism, unspecified: Secondary | ICD-10-CM | POA: Diagnosis present

## 2024-09-06 DIAGNOSIS — Z951 Presence of aortocoronary bypass graft: Secondary | ICD-10-CM

## 2024-09-06 DIAGNOSIS — E039 Hypothyroidism, unspecified: Secondary | ICD-10-CM | POA: Diagnosis present

## 2024-09-06 DIAGNOSIS — Z7902 Long term (current) use of antithrombotics/antiplatelets: Secondary | ICD-10-CM

## 2024-09-06 DIAGNOSIS — R7989 Other specified abnormal findings of blood chemistry: Secondary | ICD-10-CM | POA: Diagnosis present

## 2024-09-06 DIAGNOSIS — R0600 Dyspnea, unspecified: Secondary | ICD-10-CM | POA: Diagnosis present

## 2024-09-06 DIAGNOSIS — L89502 Pressure ulcer of unspecified ankle, stage 2: Secondary | ICD-10-CM | POA: Diagnosis present

## 2024-09-06 DIAGNOSIS — J683 Other acute and subacute respiratory conditions due to chemicals, gases, fumes and vapors: Secondary | ICD-10-CM | POA: Diagnosis present

## 2024-09-06 DIAGNOSIS — Z87891 Personal history of nicotine dependence: Secondary | ICD-10-CM

## 2024-09-06 DIAGNOSIS — Z8616 Personal history of COVID-19: Secondary | ICD-10-CM

## 2024-09-06 DIAGNOSIS — F0393 Unspecified dementia, unspecified severity, with mood disturbance: Secondary | ICD-10-CM | POA: Diagnosis present

## 2024-09-06 LAB — URINALYSIS, ROUTINE W REFLEX MICROSCOPIC
Bacteria, UA: NONE SEEN
Bilirubin Urine: NEGATIVE
Cellular Cast, UA: 3
Glucose, UA: NEGATIVE mg/dL
Hgb urine dipstick: NEGATIVE
Ketones, ur: NEGATIVE mg/dL
Leukocytes,Ua: NEGATIVE
Nitrite: NEGATIVE
Protein, ur: 30 mg/dL — AB
Specific Gravity, Urine: 1.021 (ref 1.005–1.030)
pH: 6.5 (ref 5.0–8.0)

## 2024-09-06 LAB — CBC WITH DIFFERENTIAL/PLATELET
Abs Immature Granulocytes: 0.05 K/uL (ref 0.00–0.07)
Basophils Absolute: 0.1 K/uL (ref 0.0–0.1)
Basophils Relative: 1 %
Eosinophils Absolute: 0.1 K/uL (ref 0.0–0.5)
Eosinophils Relative: 1 %
HCT: 41.7 % (ref 39.0–52.0)
Hemoglobin: 13.8 g/dL (ref 13.0–17.0)
Immature Granulocytes: 0 %
Lymphocytes Relative: 15 %
Lymphs Abs: 1.9 K/uL (ref 0.7–4.0)
MCH: 30.7 pg (ref 26.0–34.0)
MCHC: 33.1 g/dL (ref 30.0–36.0)
MCV: 92.7 fL (ref 80.0–100.0)
Monocytes Absolute: 1.4 K/uL — ABNORMAL HIGH (ref 0.1–1.0)
Monocytes Relative: 11 %
Neutro Abs: 9.4 K/uL — ABNORMAL HIGH (ref 1.7–7.7)
Neutrophils Relative %: 72 %
Platelets: 228 K/uL (ref 150–400)
RBC: 4.5 MIL/uL (ref 4.22–5.81)
RDW: 13.9 % (ref 11.5–15.5)
WBC: 13 K/uL — ABNORMAL HIGH (ref 4.0–10.5)
nRBC: 0 % (ref 0.0–0.2)

## 2024-09-06 LAB — AMMONIA: Ammonia: 51 umol/L — ABNORMAL HIGH (ref 9–35)

## 2024-09-06 LAB — COMPREHENSIVE METABOLIC PANEL WITH GFR
ALT: 71 U/L — ABNORMAL HIGH (ref 0–44)
AST: 111 U/L — ABNORMAL HIGH (ref 15–41)
Albumin: 3.7 g/dL (ref 3.5–5.0)
Alkaline Phosphatase: 149 U/L — ABNORMAL HIGH (ref 38–126)
Anion gap: 14 (ref 5–15)
BUN: 12 mg/dL (ref 8–23)
CO2: 23 mmol/L (ref 22–32)
Calcium: 9.9 mg/dL (ref 8.9–10.3)
Chloride: 99 mmol/L (ref 98–111)
Creatinine, Ser: 1.3 mg/dL — ABNORMAL HIGH (ref 0.61–1.24)
GFR, Estimated: 53 mL/min — ABNORMAL LOW (ref >60)
Glucose, Bld: 191 mg/dL — ABNORMAL HIGH (ref 70–99)
Potassium: 4.2 mmol/L (ref 3.5–5.1)
Sodium: 136 mmol/L (ref 135–145)
Total Bilirubin: 1.2 mg/dL (ref 0.0–1.2)
Total Protein: 8.1 g/dL (ref 6.5–8.1)

## 2024-09-06 LAB — RESP PANEL BY RT-PCR (RSV, FLU A&B, COVID)  RVPGX2
Influenza A by PCR: NEGATIVE
Influenza B by PCR: NEGATIVE
Resp Syncytial Virus by PCR: NEGATIVE
SARS Coronavirus 2 by RT PCR: NEGATIVE

## 2024-09-06 LAB — TSH: TSH: 5.75 u[IU]/mL — ABNORMAL HIGH (ref 0.350–4.500)

## 2024-09-06 NOTE — ED Provider Notes (Addendum)
 [place] EMERGENCY DEPARTMENT AT [facility] Provider Note   CSN: [id number] Arrival date & time: 09/06/24  1621     Patient presents with: Altered Mental Status   Gregory [name] is a 89 y.o. male.   Patient is a 89 year old male who presents with increased confusion and weakness.  History is obtained from the son who is at bedside.  And chart review.  Patient has a history of prior CVA, hypertension, chronic kidney disease, coronary artery disease, bipolar disorder.  He was noted to have a small subarachnoid hemorrhage after a fall on July 31.  He returned to the ED last month with some increased confusion and difficulty walking and was noted to have bilateral subdural hematomas and was admitted to the hospital.  His son states that he has been lying in the bed a lot since that hospitalization.  He is getting home physical therapy.  However over the last 48 hours he has had some increased confusion.  He has had difficulty remembering how to do basic things such as how to operate his walker.  They have not noticed any fevers.  He has had a cough and home health nurse noted his oxygen level was a little bit low.  They have noted some intermittent wheezing.  He has not had any noted fevers.  No vomiting or diarrhea.  He had a recent fall about a week ago but his son says he did not hit his head at that time.       Prior to Admission medications   Medication Sig Start Date End Date Taking? Authorizing Provider  ALPRAZolam  (XANAX ) 0.5 MG tablet Take 1 mg by mouth at bedtime. 03/17/17  Yes [provider]  amiodarone  (PACERONE ) 200 MG tablet Take 1 tablet (200 mg total) by mouth daily. 07/30/24   Lavona Agent, MD  amLODipine  (NORVASC ) 5 MG tablet Take 5 mg by mouth daily. 07/28/23   [provider]  aspirin  EC 81 MG tablet Take 81 mg by mouth daily. Swallow whole.    [provider]  atorvastatin  (LIPITOR ) 80 MG tablet Take 1 tablet (80 mg total) by mouth  daily. 12/27/22   Meng, Hao, PA  clopidogrel  (PLAVIX ) 75 MG tablet Take 75 mg by mouth daily. 05/31/23   [provider]  ferrous sulfate  325 (65 FE) MG EC tablet Take 1 tablet (325 mg total) by mouth daily with breakfast. 05/28/23 07/23/25  Dwan Aldo M, PA-C  isosorbide  mononitrate (IMDUR ) 30 MG 24 hr tablet TAKE 1/2 OF A TABLET (15 MG TOTAL) BY MOUTH DAILY Patient taking differently: Take 15 mg by mouth daily. 12/21/23   Lavona Agent, MD  levothyroxine  (SYNTHROID ) 25 MCG tablet Take 25 mcg by mouth daily before breakfast. 07/05/24 07/05/25  [provider]  mirtazapine  (REMERON ) 7.5 MG tablet TAKE 1 TABLET(7.5 MG) BY MOUTH AT BEDTIME 07/17/24   Cottle, Lorene KANDICE Raddle., MD  Multiple Vitamins-Minerals (CENTRUM ADULTS PO) Take 1 tablet by mouth daily.     [provider]  Multiple Vitamins-Minerals (ZINC PO) Take 1 capsule by mouth daily at 12 noon.    [provider]  nitroGLYCERIN  (NITROSTAT ) 0.4 MG SL tablet Place 0.4 mg under the tongue as needed for chest pain. Patient not taking: Reported on 07/23/2024    [provider]  Omega-3 Fatty Acids (FISH OIL PO) Take 1 capsule by mouth daily.    [provider]  omeprazole  (PRILOSEC) 20 MG capsule Take 1 capsule (20 mg total) by mouth  every other day. Patient not taking: Reported on 07/23/2024 05/29/24   Legrand Victory LITTIE DOUGLAS, MD  senna-docusate (SENOKOT-S) 8.6-50 MG tablet Take 1 tablet by mouth at bedtime as needed for mild constipation. 07/25/24   Cherlyn Labella, MD  sertraline  (ZOLOFT ) 50 MG tablet TAKE 3 TABLETS(150 MG) BY MOUTH DAILY 07/17/24   Cottle, Lorene KANDICE Raddle., MD  tamsulosin  (FLOMAX ) 0.4 MG CAPS capsule Take 0.4 mg by mouth at bedtime. 10/24/23   [provider]    Allergies: Patient has no known allergies.    Review of Systems  Unable to perform ROS: Mental status change    Updated Vital Signs BP 137/64   Pulse (!) 57   Temp 98.2 F (36.8 C)   Resp 13   SpO2 94%   Physical  Exam Constitutional:      Appearance: He is well-developed. He is ill-appearing.  HENT:     Head: Normocephalic and atraumatic.  Eyes:     Pupils: Pupils are equal, round, and reactive to light.  Cardiovascular:     Rate and Rhythm: Normal rate and regular rhythm.     Heart sounds: Murmur heard.  Pulmonary:     Effort: Pulmonary effort is normal. No respiratory distress.     Breath sounds: Normal breath sounds. No wheezing or rales.  Chest:     Chest wall: No tenderness.  Abdominal:     General: Bowel sounds are normal.     Palpations: Abdomen is soft.     Tenderness: There is no abdominal tenderness. There is no guarding or rebound.  Genitourinary:    Comments: Decubitus sore to his buttocks area.  There are some small amount of skin breakdown.  No redness, drainage or suggestions of infection. Musculoskeletal:        General: Normal range of motion.     Cervical back: Normal range of motion and neck supple.  Lymphadenopathy:     Cervical: No cervical adenopathy.  Skin:    General: Skin is warm and dry.     Findings: No rash.  Neurological:     General: No focal deficit present.     Mental Status: He is alert.     Comments: Oriented times person.  He is able to answer questions but is confused.  No obvious aphasia.  He has difficulty following commands but seems to be moving all extremities symmetrically without obvious focal deficits.     (all labs ordered are listed, but only abnormal results are displayed) Labs Reviewed  COMPREHENSIVE METABOLIC PANEL WITH GFR - Abnormal; Notable for the following components:      Result Value   Glucose, Bld 191 (*)    Creatinine, Ser 1.30 (*)    AST 111 (*)    ALT 71 (*)    Alkaline Phosphatase 149 (*)    GFR, Estimated 53 (*)    All other components within normal limits  CBC WITH DIFFERENTIAL/PLATELET - Abnormal; Notable for the following components:   WBC 13.0 (*)    Neutro Abs 9.4 (*)    Monocytes Absolute 1.4 (*)    All  other components within normal limits  URINALYSIS, ROUTINE W REFLEX MICROSCOPIC - Abnormal; Notable for the following components:   Protein, ur 30 (*)    All other components within normal limits  AMMONIA - Abnormal; Notable for the following components:   Ammonia 51 (*)    All other components within normal limits  TSH - Abnormal; Notable for the following components:  TSH 5.750 (*)    All other components within normal limits  RESP PANEL BY RT-PCR (RSV, FLU A&B, COVID)  [id number]    EKG: EKG Interpretation Date/Time:  Friday September 06 2024 16:37:06 EDT Ventricular Rate:  53 PR Interval:  290 QRS Duration:  92 QT Interval:  466 QTC Calculation: 437 R Axis:   48  Text Interpretation: Sinus bradycardia with 1st degree A-V block ST & T wave abnormality, consider lateral ischemia Abnormal ECG When compared with ECG of 23-Jul-2024 17:39, PREVIOUS ECG IS PRESENT since last tracing no significant change Confirmed by Lenor Hollering [phone]) on 09/06/2024 6:48:25 PM  Radiology: CT Head Wo Contrast Result Date: 09/06/2024 CLINICAL DATA:  Mental status change, unknown cause.  Confusion. EXAM: CT HEAD WITHOUT CONTRAST TECHNIQUE: Contiguous axial images were obtained from the base of the skull through the vertex without intravenous contrast. RADIATION DOSE REDUCTION: This exam was performed according to the departmental dose-optimization program which includes automated exposure control, adjustment of the mA and/or kV according to patient size and/or use of iterative reconstruction technique. COMPARISON:  08/09/2024 FINDINGS: Brain: The previously seen low-density left cerebral convexity subdural hematoma has resolved. Old right frontal infarct, stable. No acute intracranial abnormality. Specifically, no hemorrhage, hydrocephalus, mass lesion, acute infarction, or significant intracranial injury. There is atrophy and chronic small vessel disease changes. Vascular: No hyperdense vessel or unexpected  calcification. Skull: No acute calvarial abnormality. Sinuses/Orbits: No acute findings Other: None IMPRESSION: Atrophy, chronic microvascular disease. No acute intracranial abnormality. Old right frontal infarct. Resolution of the previously seen low-density left subdural hematoma. Electronically Signed   By: Franky Crease M.D.   On: 09/06/2024 19:08   DG Chest 2 View Result Date: 09/06/2024 CLINICAL DATA:  SHOB EXAM: CHEST - 2 VIEW COMPARISON:  08/08/2024 FINDINGS: Focal airspace consolidation in the right lung apex, unchanged, consistent with region of prior scarring. No new airspace consolidation or pneumothorax. Increased size of the small right pleural effusion. The small amount of loculated fluid in the right oblique fissure is unchanged. No cardiomegaly. Sternotomy wires. Aortic atherosclerosis. No acute fracture or destructive lesions. Multilevel thoracic osteophytosis. IMPRESSION: Increased size of the small dependent right pleural effusion. Small amount of loculated fluid in the upper right oblique fissure posteriorly is unchanged. Electronically Signed   By: Rogelia Myers M.D.   On: 09/06/2024 16:52     Procedures   Medications Ordered in the ED - No data to display                                  Medical Decision Making Amount and/or Complexity of Data Reviewed Labs: ordered. Radiology: ordered.  Risk Decision regarding hospitalization.      This patient presents to the ED for concern of confusion, weakness, this involves an extensive number of treatment options, and is a complaint that carries with it a high risk of complications and morbidity.  I considered the following differential and admission for this acute, potentially life threatening condition.  The differential diagnosis includes stroke, pneumonia, UTI, viral infection, electrolyte abnormality, anemia  MDM:    Patient is a 89 year old who presents with increased confusion and weakness.  He also had some mild  URI symptoms with coughing and congestion.  His lungs are fairly clear on exam.  He has no hypoxia here in the ED although the home health nurse had noted that his oxygen saturations were a little bit low.  He  is confused but I do not appreciate any focal neurologic deficits.  He does not appear to have aphasia.  Head CT does not show any acute abnormality.  No obvious stroke or bleeding that is detected on the noncontrast head CT.  His labs are nonconcerning.  His WBC count is elevated.  LFTs are elevated but they appear to be chronically elevated.  COVID and flu test is negative.  His urine is not consistent with infection.  His chest x-ray showed a chronic opacity in the right lower lung which appears to be unchanged with a slight increase in the right pleural effusion.  Given his ongoing confusion, will plan admission.  Discussed with Dr. Charlton who has accepted the patient for admission.  Will hold off on antibiotics at this point given there is no clear infection.  Dr. Charlton is in agreement with this.  (Labs, imaging, consults)  Labs: I Ordered, and personally interpreted labs.  The pertinent results include: Elevated WBC, negative COVID/flu, normal urine  Imaging Studies ordered: I ordered imaging studies including chest x-ray, head CT I independently visualized and interpreted imaging. I agree with the radiologist interpretation  Additional history obtained from son.  External records from outside source obtained and reviewed including history  Cardiac Monitoring: The patient was maintained on a cardiac monitor.  If on the cardiac monitor, I personally viewed and interpreted the cardiac monitored which showed an underlying rhythm of: Sinus rhythm  Reevaluation: After the interventions noted above, I reevaluated the patient and found that they have :stayed the same  Social Determinants of Health:    Disposition: Admit to hospital  Co morbidities that complicate the patient  evaluation  Past Medical History:  Diagnosis Date   Abdominal aneurysm (HCC)    Anginal pain (HCC)    Coronary artery disease    Depression    Heart disease    History of kidney stones    Hyperlipidemia    Hypertension    Myocardial infarction (HCC) 04/19/2023   NSTEMI   Stroke [facility]) 2004?   TIA     Medicines No orders of the defined types were placed in this encounter.   I have reviewed the patients home medicines and have made adjustments as needed  Problem List / ED Course: Problem List Items Addressed This Visit       Nervous and Auditory   * (Principal) Acute encephalopathy - Primary             Final diagnoses:  Acute encephalopathy    ED Discharge Orders     None          Lenor Hollering, MD 09/06/24 2217    Lenor Hollering, MD 09/06/24 2218

## 2024-09-06 NOTE — Telephone Encounter (Signed)
 Copied from CRM #[id number]. Topic: Clinical Concerns - Home Health/Living Facility >> Sep 06, 2024  2:14 PM Sharlet DEL wrote: [facility] is calling for clinical concerns (Ask: What symptoms are you calling about today, AND how long have you had these symptoms? Must Review HPKW list for symptoms) Document Name of Triage Nurse/BH Rep taking the call when applicable)   Include all details related to the request(s) below:  Patient is coughing and wheezing.  Deirdre would like patient to be seen  Confirm and type the Best Contact Number below:  Patient/caller contact number:         [phone]     [] Home  [x] Mobile  [] Work [] Other   [x] Okay to leave a voicemail   Medication List:  Current Outpatient Medications:  .  ALPRAZolam  (XANAX ) 0.5 mg tablet, TAKE 1 TABLET DURING THE DAY AND 2 TABLETS EVERY NIGHT AT BEDTIME AS NEEDED FOR ANXIETY AND INSOMNIA, Disp: 90 tablet, Rfl: 1 .  amiodarone  (PACERONE ) 200 mg tablet, Take 200 mg by mouth daily., Disp: , Rfl:  .  amLODIPine  (NORVASC ) 5 mg tablet, TAKE 1 TABLET BY MOUTH DAILY FOR BLOOD PRESSURE CONTROL, Disp: 90 tablet, Rfl: 3 .  aspirin  81 mg EC tablet, Take 81 mg by mouth Once Daily., Disp: , Rfl:  .  atorvastatin  (LIPITOR ) 80 mg tablet, Take 1 tablet (80 mg total) by mouth daily., Disp: 90 tablet, Rfl: 3 .  clopidogreL  (PLAVIX ) 75 mg tablet, TAKE 1 TABLET BY MOUTH EVERY DAY, Disp: 90 tablet, Rfl: 0 .  isosorbide  mononitrate (IMDUR ) 30 mg 24 hr tablet, Take 15 mg by mouth., Disp: , Rfl:  .  levothyroxine  (SYNTHROID ) 25 mcg tablet, TAKE 1 TABLET (25 MCG TOTAL) BY MOUTH IN THE MORNING, Disp: 30 tablet, Rfl: 0 .  mirtazapine  (REMERON ) 7.5 mg tablet, Taking nightly.  Prescribed by psychiatrist., Disp: 30 tablet, Rfl: 0 .  omega 3-dha-epa-fish oil (OMEGA 3) 1,000 mg capsule, Take 1 g by mouth Once Daily., Disp: , Rfl:  .  omeprazole  (PriLOSEC) 20 mg DR capsule, Take 20 mg by mouth every other day., Disp: , Rfl:  .  sertraline  (ZOLOFT ) 50 mg tablet,  3 a day prescribed by psychiatrist., Disp: , Rfl:  .  tamsulosin  (FLOMAX ) 0.4 mg cap, Take 1 capsule daily, at bedtime, to improve bladder function, Disp: 90 capsule, Rfl: 3   Medication Request/Refills: Pharmacy Information (if applicable)   [x] Not Applicable       []  Pharmacy listed  Send Medication Request to:                                                 [] Pharmacy not listed (added to pharmacy list in Epic) Send Medication Request to:      Listed Pharmacies: [id number] - [place], [place] - [address] - PHONE: [phone] - FAX: [phone] [id number] - [place], [place] - [address] - PHONE: [phone] - FAX: [phone]

## 2024-09-06 NOTE — ED Notes (Signed)
Pt attempted to urinate, unsuccessful ?

## 2024-09-06 NOTE — ED Triage Notes (Signed)
 Pt w son, c/o confusion & low O2 (reports 89 at home). Son advises a mo ago, 2 falls & brain bleed, nurse came by today to check some things & O2 was low, confusion worsening over last 48hrs. It started w the two falls, just hasn't gotten better.

## 2024-09-06 NOTE — Progress Notes (Signed)
 Plan of Care Note for accepted transfer   Patient: Gregory [name] MRN: [id number]   DOA: 09/06/2024  Facility requesting transfer: [facility]   Requesting Provider: Dr. 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   WBC is 13, labs otherwise stable and UA unremarkable. No acute findings on head CT.    Plan of care: The patient is accepted for admission to Telemetry unit, at [facility].   Author: Evalene GORMAN Sprinkles, MD 09/06/2024  Check [email or web address] for on-call coverage.  Nursing staff, Please call [facility] Admits & Consults System-Wide number on Amion as soon as patient's arrival, so appropriate admitting provider can evaluate the pt.

## 2024-09-06 NOTE — Telephone Encounter (Signed)
 Live Answer: When patient returns call, please communicate the follow information:  Live Answer can communication the following information: [facility]: needs to take pt to urgent care so they can xray his chest(we do not have xray in office now)/pos,lpn

## 2024-09-07 DIAGNOSIS — J683 Other acute and subacute respiratory conditions due to chemicals, gases, fumes and vapors: Secondary | ICD-10-CM | POA: Diagnosis present

## 2024-09-07 DIAGNOSIS — K760 Fatty (change of) liver, not elsewhere classified: Secondary | ICD-10-CM | POA: Diagnosis present

## 2024-09-07 DIAGNOSIS — L89502 Pressure ulcer of unspecified ankle, stage 2: Secondary | ICD-10-CM | POA: Diagnosis present

## 2024-09-07 DIAGNOSIS — N481 Balanitis: Secondary | ICD-10-CM | POA: Diagnosis present

## 2024-09-07 DIAGNOSIS — K5900 Constipation, unspecified: Secondary | ICD-10-CM | POA: Diagnosis present

## 2024-09-07 DIAGNOSIS — R0600 Dyspnea, unspecified: Secondary | ICD-10-CM | POA: Diagnosis present

## 2024-09-07 DIAGNOSIS — I2510 Atherosclerotic heart disease of native coronary artery without angina pectoris: Secondary | ICD-10-CM | POA: Diagnosis present

## 2024-09-07 DIAGNOSIS — G9340 Encephalopathy, unspecified: Secondary | ICD-10-CM | POA: Diagnosis not present

## 2024-09-07 DIAGNOSIS — N471 Phimosis: Secondary | ICD-10-CM | POA: Diagnosis present

## 2024-09-07 DIAGNOSIS — E1165 Type 2 diabetes mellitus with hyperglycemia: Secondary | ICD-10-CM | POA: Diagnosis present

## 2024-09-07 DIAGNOSIS — E7220 Disorder of urea cycle metabolism, unspecified: Secondary | ICD-10-CM | POA: Diagnosis present

## 2024-09-07 LAB — COMPREHENSIVE METABOLIC PANEL WITH GFR
ALT: 60 U/L — ABNORMAL HIGH (ref 0–44)
AST: 90 U/L — ABNORMAL HIGH (ref 15–41)
Albumin: 3.3 g/dL — ABNORMAL LOW (ref 3.5–5.0)
Alkaline Phosphatase: 141 U/L — ABNORMAL HIGH (ref 38–126)
Anion gap: 12 (ref 5–15)
BUN: 12 mg/dL (ref 8–23)
CO2: 22 mmol/L (ref 22–32)
Calcium: 9.2 mg/dL (ref 8.9–10.3)
Chloride: 98 mmol/L (ref 98–111)
Creatinine, Ser: 1.14 mg/dL (ref 0.61–1.24)
GFR, Estimated: >60 mL/min (ref >60)
Glucose, Bld: 171 mg/dL — ABNORMAL HIGH (ref 70–99)
Potassium: 4.5 mmol/L (ref 3.5–5.1)
Sodium: 133 mmol/L — ABNORMAL LOW (ref 135–145)
Total Bilirubin: 1 mg/dL (ref 0.0–1.2)
Total Protein: 7.4 g/dL (ref 6.5–8.1)

## 2024-09-07 LAB — CBC
HCT: 42.2 % (ref 39.0–52.0)
Hemoglobin: 13.8 g/dL (ref 13.0–17.0)
MCH: 30.8 pg (ref 26.0–34.0)
MCHC: 32.7 g/dL (ref 30.0–36.0)
MCV: 94.2 fL (ref 80.0–100.0)
Platelets: 179 K/uL (ref 150–400)
RBC: 4.48 MIL/uL (ref 4.22–5.81)
RDW: 13.8 % (ref 11.5–15.5)
WBC: 11.6 K/uL — ABNORMAL HIGH (ref 4.0–10.5)
nRBC: 0 % (ref 0.0–0.2)

## 2024-09-07 LAB — AMMONIA: Ammonia: 68 umol/L — ABNORMAL HIGH (ref 9–35)

## 2024-09-07 LAB — GLUCOSE, CAPILLARY
Glucose-Capillary: 172 mg/dL — ABNORMAL HIGH (ref 70–99)
Glucose-Capillary: 211 mg/dL — ABNORMAL HIGH (ref 70–99)
Glucose-Capillary: 239 mg/dL — ABNORMAL HIGH (ref 70–99)

## 2024-09-07 LAB — PROCALCITONIN: Procalcitonin: 0.23 ng/mL

## 2024-09-07 LAB — HEMOGLOBIN A1C
Hgb A1c MFr Bld: 7.6 % — ABNORMAL HIGH (ref 4.8–5.6)
Mean Plasma Glucose: 171.42 mg/dL

## 2024-09-07 LAB — MAGNESIUM: Magnesium: 1.7 mg/dL (ref 1.7–2.4)

## 2024-09-07 LAB — PHOSPHORUS: Phosphorus: 2.5 mg/dL (ref 2.5–4.6)

## 2024-09-07 MED ORDER — ACETAMINOPHEN 650 MG RE SUPP: 650.0000 mg | Freq: Four times a day (QID) | RECTAL | Status: DC | PRN

## 2024-09-07 MED ORDER — SENNOSIDES-DOCUSATE SODIUM 8.6-50 MG PO TABS
1.0000 | ORAL_TABLET | Freq: Every day | ORAL | Status: DC
Start: 2024-09-07 — End: 2024-09-07

## 2024-09-07 MED ORDER — INSULIN ASPART 100 UNIT/ML IJ SOLN
0.0000 [IU] | Freq: Three times a day (TID) | INTRAMUSCULAR | Status: DC
  Administered 2024-09-07: 3 [IU] via SUBCUTANEOUS
  Administered 2024-09-07 – 2024-09-08 (×2): 2 [IU] via SUBCUTANEOUS
  Administered 2024-09-08: 3 [IU] via SUBCUTANEOUS
  Administered 2024-09-09: 5 [IU] via SUBCUTANEOUS
  Administered 2024-09-09: 1 [IU] via SUBCUTANEOUS
  Administered 2024-09-09 – 2024-09-10 (×3): 3 [IU] via SUBCUTANEOUS
  Administered 2024-09-11: 5 [IU] via SUBCUTANEOUS

## 2024-09-07 MED ORDER — PROCHLORPERAZINE EDISYLATE 10 MG/2ML IJ SOLN: 5.0000 mg | Freq: Four times a day (QID) | INTRAMUSCULAR | Status: DC | PRN

## 2024-09-07 MED ORDER — LEVOTHYROXINE SODIUM 25 MCG PO TABS
25.0000 ug | ORAL_TABLET | Freq: Every day | ORAL | Status: DC
  Administered 2024-09-07 – 2024-09-11 (×5): 25 ug via ORAL
  Filled 2024-09-07 (×5): qty 1

## 2024-09-07 MED ORDER — SODIUM CHLORIDE 0.9 % IV SOLN: INTRAVENOUS | Status: AC

## 2024-09-07 MED ORDER — ACETAMINOPHEN 325 MG PO TABS: 650.0000 mg | ORAL_TABLET | Freq: Four times a day (QID) | ORAL | Status: DC | PRN

## 2024-09-07 MED ORDER — LACTULOSE 10 GM/15ML PO SOLN
10.0000 g | Freq: Two times a day (BID) | ORAL | Status: DC
  Administered 2024-09-07 (×2): 10 g via ORAL
  Filled 2024-09-07 (×2): qty 15

## 2024-09-07 MED ORDER — ALBUTEROL SULFATE (2.5 MG/3ML) 0.083% IN NEBU: 2.5000 mg | INHALATION_SOLUTION | RESPIRATORY_TRACT | Status: DC | PRN

## 2024-09-07 MED ORDER — METHYLPREDNISOLONE SODIUM SUCC 40 MG IJ SOLR
40.0000 mg | Freq: Once | INTRAMUSCULAR | Status: AC
  Administered 2024-09-07: 40 mg via INTRAVENOUS
  Filled 2024-09-07: qty 1

## 2024-09-07 MED ORDER — TRIAMCINOLONE ACETONIDE 0.025 % EX CREA
TOPICAL_CREAM | Freq: Two times a day (BID) | CUTANEOUS | Status: DC
Start: 2024-09-07 — End: 2024-09-11
  Administered 2024-09-10: 1 via TOPICAL
  Filled 2024-09-07: qty 15

## 2024-09-07 MED ORDER — SODIUM CHLORIDE 0.9 % IV SOLN: INTRAVENOUS | Status: DC

## 2024-09-07 MED ORDER — ENOXAPARIN SODIUM 40 MG/0.4ML IJ SOSY
40.0000 mg | PREFILLED_SYRINGE | INTRAMUSCULAR | Status: DC
  Administered 2024-09-07 – 2024-09-10 (×4): 40 mg via SUBCUTANEOUS
  Filled 2024-09-07 (×4): qty 0.4

## 2024-09-07 MED ORDER — TAMSULOSIN HCL 0.4 MG PO CAPS
0.4000 mg | ORAL_CAPSULE | Freq: Every day | ORAL | Status: DC
  Administered 2024-09-07 – 2024-09-10 (×4): 0.4 mg via ORAL
  Filled 2024-09-07 (×4): qty 1

## 2024-09-07 MED ORDER — POLYETHYLENE GLYCOL 3350 17 G PO PACK: 17.0000 g | PACK | Freq: Every day | ORAL | Status: DC | PRN

## 2024-09-07 MED ORDER — SERTRALINE HCL 50 MG PO TABS
150.0000 mg | ORAL_TABLET | Freq: Every day | ORAL | Status: DC
  Administered 2024-09-07 – 2024-09-11 (×5): 150 mg via ORAL
  Filled 2024-09-07 (×5): qty 1

## 2024-09-07 MED ORDER — GUAIFENESIN ER 600 MG PO TB12
600.0000 mg | ORAL_TABLET | Freq: Two times a day (BID) | ORAL | Status: DC
  Administered 2024-09-07 – 2024-09-11 (×8): 600 mg via ORAL
  Filled 2024-09-07 (×8): qty 1

## 2024-09-07 MED ORDER — ISOSORBIDE MONONITRATE ER 30 MG PO TB24
15.0000 mg | ORAL_TABLET | Freq: Every day | ORAL | Status: DC
  Administered 2024-09-07 – 2024-09-11 (×5): 15 mg via ORAL
  Filled 2024-09-07 (×5): qty 1

## 2024-09-07 MED ORDER — MAGNESIUM OXIDE -MG SUPPLEMENT 400 (240 MG) MG PO TABS
800.0000 mg | ORAL_TABLET | Freq: Once | ORAL | Status: AC
  Administered 2024-09-07: 800 mg via ORAL
  Filled 2024-09-07: qty 2

## 2024-09-07 MED ORDER — IPRATROPIUM-ALBUTEROL 0.5-2.5 (3) MG/3ML IN SOLN
3.0000 mL | Freq: Two times a day (BID) | RESPIRATORY_TRACT | Status: DC
  Administered 2024-09-07 – 2024-09-08 (×3): 3 mL via RESPIRATORY_TRACT
  Filled 2024-09-07 (×3): qty 3

## 2024-09-07 MED ORDER — IPRATROPIUM-ALBUTEROL 0.5-2.5 (3) MG/3ML IN SOLN
3.0000 mL | Freq: Four times a day (QID) | RESPIRATORY_TRACT | Status: DC
  Administered 2024-09-07 (×2): 3 mL via RESPIRATORY_TRACT
  Filled 2024-09-07 (×2): qty 3

## 2024-09-07 MED ORDER — MAGNESIUM SULFATE 2 GM/50ML IV SOLN: 2.0000 g | Freq: Once | INTRAVENOUS | Status: DC

## 2024-09-07 MED ORDER — MIRTAZAPINE 15 MG PO TABS
7.5000 mg | ORAL_TABLET | Freq: Every day | ORAL | Status: DC
  Administered 2024-09-07 – 2024-09-09 (×3): 7.5 mg via ORAL
  Filled 2024-09-07 (×4): qty 1

## 2024-09-07 MED ORDER — AMIODARONE HCL 200 MG PO TABS
200.0000 mg | ORAL_TABLET | Freq: Every day | ORAL | Status: DC
  Administered 2024-09-07 – 2024-09-11 (×5): 200 mg via ORAL
  Filled 2024-09-07 (×5): qty 1

## 2024-09-07 MED ORDER — SODIUM CHLORIDE 0.45 % IV BOLUS
1000.0000 mL | Freq: Once | INTRAVENOUS | Status: AC
  Administered 2024-09-07: 1000 mL via INTRAVENOUS

## 2024-09-07 MED ORDER — PREDNISONE 20 MG PO TABS
40.0000 mg | ORAL_TABLET | Freq: Every day | ORAL | Status: DC
  Administered 2024-09-08 – 2024-09-11 (×4): 40 mg via ORAL
  Filled 2024-09-07 (×4): qty 2

## 2024-09-07 NOTE — Plan of Care (Signed)
  Problem: Education: Goal: Knowledge of General Education information will improve Description: Including pain rating scale, medication(s)/side effects and non-pharmacologic comfort measures Outcome: Not Progressing   Problem: Activity: Goal: Risk for activity intolerance will decrease Outcome: Not Progressing   Problem: Nutrition: Goal: Adequate nutrition will be maintained Outcome: Not Progressing   Problem: Safety: Goal: Ability to remain free from injury will improve Outcome: Not Progressing   

## 2024-09-07 NOTE — Care Management Obs Status (Signed)
 MEDICARE OBSERVATION STATUS NOTIFICATION   Patient Details  Name: Gregory [name] MRN: [id number] Date of Birth: 03-11-1934   Medicare Observation Status Notification Given:  Yes    Tarynn Garling LILLETTE Fenton, LCSW 09/07/2024, 10:46 AM

## 2024-09-07 NOTE — Progress Notes (Signed)
 MD has placed Consult to treat patient- PT has been assessed therefore  order will not be addressed.

## 2024-09-07 NOTE — H&P (Signed)
 History and Physical    Patient: Gregory [name] FMW:[id number] DOB: 01/02/1934 DOA: 09/06/2024 DOS: the patient was seen and examined on 09/07/2024 PCP: Tanda Prentice DEL, MD  Patient coming from: Home  Chief Complaint:  Chief Complaint  Patient presents with   Altered Mental Status   HPI: Gregory [name] is a 89 y.o. male with medical history significant of abdominal aneurysm anginal pain, CAD, history of NSTEMI, depression, kidney disease, hyperlipidemia, hypertension, history of CVA, history of small subarachnoid hemorrhage after having a fall on July 31.  He was also admitted due to bilateral subdural hematomas.  He has been slowly declining since these hospitalizations.  According to the patient's son he has been staying in bed most of the day.  He has been having difficulty remembering how to do basic tasks.  He used to ambulate with a walker and make transfers without assistance, but has had a significant decline over the last few days and is unable to get up without the assistance of 2 persons.  He has been coughing, wheezing and hypoxia.  His appetite is decreased. He denied fever, chills, rhinorrhea, sore throat or hemoptysis.  No chest pain, palpitations, diaphoresis, PND, orthopnea or pitting edema of the lower extremities.  No abdominal pain, nausea, emesis, diarrhea, melena or hematochezia.  He has been constipated for the last few days.  No flank pain, dysuria, frequency or hematuria.  No polyuria, polydipsia, polyphagia or blurred vision.   Lab work: CBC showed a white count of 13.0, hemoglobin 13.8 g/dL and platelets 771.  Coronavirus, influenza and RSV PCR test was negative.  Ammonia was 51 mol/L.  TSH 5.75 IU/mL.  CMP showed a glucose of 191 and creatinine of 1.30 mg/dL BUN and electrolytes are normal.  AST 811, ALT 21 alkaline phosphatase 149 units/L.  Normal bilirubin, total protein and albumin  level.  Imaging: 2 view chest radiograph showing increased size of the small dependent  right pleural effusion.  Small amount of loculated fluid in the right upper oblique fissure posteriorly is unchanged.  CT head without contrast showing an old right frontal infarct, atrophy, chronic microvascular disease, resolution of previous low-density left subdural hematoma, but no acute intracranial abnormality.  Abnormality.   ED course: Initial vital signs were temperature 98.2 F, pulse 54, respirations 18, BP 136/63 and O2 sat 93% on room air.  Review of Systems: As mentioned in the history of present illness. All other systems reviewed and are negative. Past Medical History:  Diagnosis Date   Abdominal aneurysm (HCC)    Anginal pain (HCC)    Coronary artery disease    Depression    Heart disease    History of kidney stones    Hyperlipidemia    Hypertension    Myocardial infarction [facility] 04/19/2023   NSTEMI   Stroke [facility] 2004?   TIA   Past Surgical History:  Procedure Laterality Date   COLONOSCOPY     CORONARY ANGIOGRAPHY N/A 12/29/2022   Procedure: CORONARY ANGIOGRAPHY;  Surgeon: Court Dorn PARAS, MD;  Location: [facility] INVASIVE CV LAB;  Service: Cardiovascular;  Laterality: N/A;   CORONARY ARTERY BYPASS GRAFT N/A 05/22/2023   Procedure: CORONARY ARTERY BYPASS GRAFTING (CABG) X 2 WITH LEFT INTERNAL MAMMARY ARTERY HARVEST AND GREATER SAPHENOUS VEIN HARVESTED ENDOSCOPICALLY;  Surgeon: Kerrin Elspeth BROCKS, MD;  Location: [facility] OR;  Service: Open Heart Surgery;  Laterality: N/A;   ESOPHAGOGASTRODUODENOSCOPY (EGD) WITH PROPOFOL  N/A 05/27/2024   Procedure: ESOPHAGOGASTRODUODENOSCOPY (EGD) WITH PROPOFOL ;  Surgeon: Legrand Victory LITTIE DOUGLAS, MD;  Location: [facility] ENDOSCOPY;  Service: Gastroenterology;  Laterality: N/A;   EYE SURGERY     cataract right   IR ANGIO INTRA EXTRACRAN SEL COM CAROTID INNOMINATE BILAT MOD SED  02/28/2018   IR ANGIO VERTEBRAL SEL SUBCLAVIAN INNOMINATE UNI R MOD SED  02/28/2018   IR RADIOLOGIST EVAL & MGMT  02/12/2021   LEFT HEART CATH AND CORONARY ANGIOGRAPHY N/A  12/26/2022   Procedure: LEFT HEART CATH AND CORONARY ANGIOGRAPHY;  Surgeon: Court Dorn PARAS, MD;  Location: [facility] INVASIVE CV LAB;  Service: Cardiovascular;  Laterality: N/A;   LEFT HEART CATH AND CORONARY ANGIOGRAPHY N/A 05/16/2023   Procedure: LEFT HEART CATH AND CORONARY ANGIOGRAPHY;  Surgeon: Dann Candyce RAMAN, MD;  Location: [facility] INVASIVE CV LAB;  Service: Cardiovascular;  Laterality: N/A;   OTHER SURGICAL HISTORY     Carotid    RADIOLOGY WITH ANESTHESIA N/A 02/28/2018   Procedure: STENTING;  Surgeon: Dolphus Carrion, MD;  Location: [facility] OR;  Service: Radiology;  Laterality: N/A;   TEE WITHOUT CARDIOVERSION N/A 05/22/2023   Procedure: TRANSESOPHAGEAL ECHOCARDIOGRAM;  Surgeon: Kerrin Elspeth BROCKS, MD;  Location: [facility] OR;  Service: Open Heart Surgery;  Laterality: N/A;   VIDEO BRONCHOSCOPY WITH ENDOBRONCHIAL NAVIGATION N/A 04/27/2023   Procedure: VIDEO BRONCHOSCOPY WITH ENDOBRONCHIAL NAVIGATION W/ BIOPSIES;  Surgeon: Kerrin Elspeth BROCKS, MD;  Location: [facility] OR;  Service: Thoracic;  Laterality: N/A;   Social History:  reports that he has quit smoking. His smoking use included cigarettes. He has never used smokeless tobacco. He reports that he does not drink alcohol and does not use drugs.  No Known Allergies  Family History  Problem Relation Age of Onset   Breast cancer Mother    Heart disease Mother    Heart disease Brother    Tremor Neg Hx     Prior to Admission medications   Medication Sig Start Date End Date Taking? Authorizing Provider  ALPRAZolam  (XANAX ) 0.5 MG tablet Take 1 mg by mouth at bedtime. 03/17/17  Yes [provider]  amiodarone  (PACERONE ) 200 MG tablet Take 1 tablet (200 mg total) by mouth daily. 07/30/24   Lavona Agent, MD  amLODipine  (NORVASC ) 5 MG tablet Take 5 mg by mouth daily. 07/28/23   [provider]  aspirin  EC 81 MG tablet Take 81 mg by mouth daily. Swallow whole.    [provider]  atorvastatin  (LIPITOR ) 80 MG tablet Take 1  tablet (80 mg total) by mouth daily. 12/27/22   Meng, Hao, PA  clopidogrel  (PLAVIX ) 75 MG tablet Take 75 mg by mouth daily. 05/31/23   [provider]  ferrous sulfate  325 (65 FE) MG EC tablet Take 1 tablet (325 mg total) by mouth daily with breakfast. 05/28/23 07/23/25  Dwan Aldo M, PA-C  isosorbide  mononitrate (IMDUR ) 30 MG 24 hr tablet TAKE 1/2 OF A TABLET (15 MG TOTAL) BY MOUTH DAILY Patient taking differently: Take 15 mg by mouth daily. 12/21/23   Lavona Agent, MD  levothyroxine  (SYNTHROID ) 25 MCG tablet Take 25 mcg by mouth daily before breakfast. 07/05/24 07/05/25  [provider]  mirtazapine  (REMERON ) 7.5 MG tablet TAKE 1 TABLET(7.5 MG) BY MOUTH AT BEDTIME 07/17/24   Cottle, Lorene KANDICE Raddle., MD  Multiple Vitamins-Minerals (CENTRUM ADULTS PO) Take 1 tablet by mouth daily.     [provider]  Multiple Vitamins-Minerals (ZINC PO) Take 1 capsule by mouth daily at 12 noon.    [provider]  nitroGLYCERIN  (NITROSTAT ) 0.4 MG SL tablet Place 0.4 mg under the tongue as needed  for chest pain. Patient not taking: Reported on 07/23/2024    [provider]  Omega-3 Fatty Acids (FISH OIL PO) Take 1 capsule by mouth daily.    [provider]  omeprazole  (PRILOSEC) 20 MG capsule Take 1 capsule (20 mg total) by mouth every other day. Patient not taking: Reported on 07/23/2024 05/29/24   Legrand Victory LITTIE DOUGLAS, MD  senna-docusate (SENOKOT-S) 8.6-50 MG tablet Take 1 tablet by mouth at bedtime as needed for mild constipation. 07/25/24   Cherlyn Labella, MD  sertraline  (ZOLOFT ) 50 MG tablet TAKE 3 TABLETS(150 MG) BY MOUTH DAILY 07/17/24   Cottle, Lorene KANDICE Raddle., MD  tamsulosin  (FLOMAX ) 0.4 MG CAPS capsule Take 0.4 mg by mouth at bedtime. 10/24/23   [provider]    Physical Exam: Vitals:   09/06/24 2245 09/06/24 2300 09/07/24 0053 09/07/24 0447  BP: 131/74 (!) 146/72 122/62 (!) 147/69  Pulse: (!) 58 (!) 57 (!) 57 60  Resp: 16 11 18 20   Temp:   98.4 F (36.9  C) 98.6 F (37 C)  TempSrc:   Oral Oral  SpO2: 94% 93% 94% 94%  Weight:   88.6 kg   Height:   6' 0.02 (1.829 m)    Physical Exam Vitals and nursing note reviewed.  Constitutional:      General: He is awake. He is not in acute distress.    Appearance: He is ill-appearing.  HENT:     Head: Normocephalic.     Nose: No rhinorrhea.     Mouth/Throat:     Mouth: Mucous membranes are dry.  Eyes:     General: No scleral icterus.    Pupils: Pupils are equal, round, and reactive to light.  Neck:     Vascular: No JVD.  Cardiovascular:     Rate and Rhythm: Normal rate and regular rhythm.     Heart sounds: S1 normal and S2 normal.  Pulmonary:     Breath sounds: No wheezing, rhonchi or rales.  Abdominal:     General: Bowel sounds are normal. There is no distension.     Palpations: Abdomen is soft.     Tenderness: There is no abdominal tenderness. There is no right CVA tenderness or left CVA tenderness.  Musculoskeletal:     Cervical back: Neck supple.     Right lower leg: No edema.     Left lower leg: No edema.  Skin:    General: Skin is warm and dry.  Neurological:     Mental Status: He is alert. He is disoriented.  Psychiatric:        Mood and Affect: Mood normal.        Behavior: Behavior is cooperative.        Data Reviewed:  Results are pending, will review when available. 05/17/2023 echocardiogram report. IMPRESSIONS:   1. Left ventricular ejection fraction, by estimation, is 55 to 60%. The  left ventricle has normal function. The left ventricle has no regional  wall motion abnormalities. There is mild asymmetric left ventricular  hypertrophy of the septal segment. Left  ventricular diastolic parameters are consistent with Grade I diastolic  dysfunction (impaired relaxation).   2. Right ventricular systolic function is normal. The right ventricular  size is normal. Tricuspid regurgitation signal is inadequate for assessing  PA pressure.   3. The mitral valve is  grossly normal. Trivial mitral valve  regurgitation. No evidence of mitral stenosis.   4. The aortic valve is calcified. There is mild calcification of the  aortic valve. There is mild thickening of the aortic valve. Aortic valve  regurgitation is trivial. Aortic valve sclerosis/calcification is present,  without any evidence of aortic  stenosis.   5. Aortic dilatation noted. There is mild dilatation of the aortic root,  measuring 40 mm.   6. The inferior vena cava is normal in size with greater than 50%  respiratory variability, suggesting right atrial pressure of 3 mmHg.   Comparison(s): No significant change from prior study.   EKG: Vent. rate 53 BPM PR interval 290 ms QRS duration 92 ms QT/QTcB 466/437 ms P-R-T axes 24 48 110 Sinus bradycardia with 1st degree A-V block ST & T wave abnormality, consider lateral ischemia Abnormal ECG  Assessment and Plan: Principal Problem:   Acute encephalopathy Associated with:   Generalized weakness In the setting of:   Hyperammonemia (HCC) And dyspnea on exertion. Observation/telemetry. Supplemental oxygen as needed. Lactulose  20 mg p.o. twice daily. Follow-up ammonia level in AM.  Active Problems:   Dyspnea  In the setting of:   Reactive airways dysfunction syndrome (HCC)  Continue supplemental oxygen. Methylprednisolone  40 mg IVP x1. Followed by prednisone  40 mg p.o. daily in a.m. Scheduled and as needed bronchodilators. Follow-up CBC and chemistry in the morning.     Phimosis of penis Associated with:   Balanitis Easily reduced by urology. -Consult appreciated. They also recommended topical steroid for balanitis.    Essential hypertension As needed antihypertensives.    Stage 3a chronic kidney disease (CKD) (HCC) Monitor renal function and electrolytes.    AAA (abdominal aortic aneurysm) without rupture (HCC) Follow-up imaging as scheduled.    Carotid artery disease (HCC)   CAD (coronary artery disease)   PVD  (peripheral vascular disease) (HCC) History of recent intracranial bleed. Currently off antiplatelet therapy. Continue daily nitrates.    Elevated LFTs In the setting of:   Hepatic steatosis Will hold statin for now. Follow LFTs in AM. Consider GI evaluation if no improvement.    Type 2 diabetes mellitus with hyperglycemia (HCC)  Carbohydrate modified diet. CBG monitoring with RI SS. Check hemoglobin A1c.    Hyperlipidemia Atorvastatin  held due to transaminitis. Follow-up with primary care provider.    GAD (generalized anxiety disorder) Continue sertraline  150 mg p.o. daily. Continue mirtazapine  7.5 mg p.o. at bedtime. Anxiolytics as needed.    BPH (benign prostatic hyperplasia) Continue tamsulosin  0.4 mg p.o. daily.    Chronic diastolic CHF (congestive heart failure) (HCC) No signs of decompensation. Continue Imdur  15 mg p.o. daily.    Pressure injury of ankle, stage 2 (HCC) Continue local care and preventive measures.     Constipation Magnesium  oxide 800 mg p.o. x 1. Also started on lactulose .     Advance Care Planning:   Code Status: Full Code   Consults: Urology Charissa Salines, MD).  Family Communication: His son was at bedside.  Severity of Illness: The appropriate patient status for this patient is OBSERVATION. Observation status is judged to be reasonable and necessary in order to provide the required intensity of service to ensure the patient's safety. The patient's presenting symptoms, physical exam findings, and initial radiographic and laboratory data in the context of their medical condition is felt to place them at decreased risk for further clinical deterioration. Furthermore, it is anticipated that the patient will be medically stable for discharge from the hospital within 2 midnights of admission.   Author: Alm Dorn Castor, MD 09/07/2024 7:47 AM  For on call review [email or web address].   This document was prepared using  [employer] and may contain some unintended transcription errors.

## 2024-09-07 NOTE — Consult Note (Signed)
 I have been asked to see the patient by Dr. Alm Castor, for evaluation and management of phimosis and balanitis.  History of present illness: 89 year old male with multiple comorbidities including stroke and dementia presented to the emergency department with worsening confusion and weakness.  During the evaluation he was noted to have erythema on head of his penis and some mild phimosis.  I was consulted for further evaluation.  Review of systems: A 12 point comprehensive review of systems was obtained and is negative unless otherwise stated in the history of present illness.  Patient Active Problem List   Diagnosis Date Noted   Pressure injury of ankle, stage 2 (HCC) 09/07/2024   CAD (coronary artery disease) 09/07/2024   Phimosis of penis 09/07/2024   Balanitis 09/07/2024   Hyperammonemia (HCC) 09/07/2024   Constipation 09/07/2024   Acute encephalopathy 09/06/2024   Subdural hematoma without coma, without loss of consciousness, initial encounter (HCC) 07/23/2024   Abnormal urinalysis 07/23/2024   Elevated LFTs 07/23/2024   Malignant neoplasm of right upper lobe of lung (HCC) 06/24/2024   Esophageal dysphagia 05/27/2024   Abnormal CT of the chest 05/27/2024   COVID-19 virus infection 04/24/2024   Acute hypoxic respiratory failure (HCC) 04/24/2024   Acute metabolic encephalopathy 04/24/2024   Acute hyponatremia 04/24/2024   Generalized weakness 04/24/2024   CKD stage 3b, GFR 30-44 ml/min (HCC) 04/24/2024   GAD (generalized anxiety disorder) 04/24/2024   BPH (benign prostatic hyperplasia) 04/24/2024   Chronic diastolic CHF (congestive heart failure) (HCC) 04/24/2024   History of anemia due to chronic kidney disease 04/24/2024   Hypoxia 04/23/2024   PVC's (premature ventricular contractions) 11/06/2023   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   Hyperlipidemia 02/28/2023   Stage 3a chronic kidney disease (CKD) (HCC) 12/30/2022   Hyperglycemia 12/30/2022   Lung nodule 12/30/2022    Coronary artery disease involving native coronary artery of native heart with unstable angina pectoris (HCC) 12/30/2022   AAA (abdominal aortic aneurysm) without rupture (HCC) 12/30/2022   Carotid artery disease (HCC) 12/30/2022   Non-ST elevation (NSTEMI) myocardial infarction (HCC) 12/29/2022   AKI (acute kidney injury) (HCC) 12/25/2022   Essential hypertension 12/25/2022   History of CVA (cerebrovascular accident) 12/25/2022   Angina pectoris (HCC) 12/24/2022   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 01/02/2018   Tremor 06/14/2017   Premature atrial beats 03/10/2016   PVD (peripheral vascular disease) (HCC) 03/10/2016   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/17/2013    No current facility-administered medications on file prior to encounter.   Current Outpatient Medications on File Prior to Encounter  Medication Sig Dispense Refill   ALPRAZolam  (XANAX ) 0.5 MG tablet Take 1 mg by mouth at bedtime.     amiodarone  (PACERONE ) 200 MG tablet Take 1 tablet (200 mg total) by mouth daily. (Patient taking differently: Take 200 mg by mouth every evening.) 90 tablet 3   amLODipine  (NORVASC ) 5 MG tablet Take 5 mg by mouth daily.     atorvastatin  (LIPITOR ) 80 MG tablet Take 1 tablet (80 mg total) by mouth daily. 90 tablet 3   ferrous sulfate  325 (65 FE) MG EC tablet Take 1 tablet (325 mg total) by mouth daily with breakfast. 60 tablet 0   isosorbide  mononitrate (IMDUR ) 30 MG 24 hr tablet TAKE 1/2 OF A TABLET (15 MG TOTAL) BY MOUTH DAILY (Patient taking differently: Take 15 mg by mouth daily.) 45 tablet 3   levothyroxine  (SYNTHROID ) 25 MCG tablet Take 25 mcg by mouth daily before breakfast.  mirtazapine  (REMERON ) 7.5 MG tablet TAKE 1 TABLET(7.5 MG) BY MOUTH AT BEDTIME 90 tablet 2   Multiple Vitamins-Minerals (CENTRUM ADULTS PO) Take 1 tablet by mouth daily.      Multiple Vitamins-Minerals (ZINC PO) Take 1 capsule by mouth daily at 12 noon.     Omega-3 Fatty Acids (FISH OIL PO) Take 1 capsule by  mouth daily.     omeprazole  (PRILOSEC) 20 MG capsule Take 1 capsule (20 mg total) by mouth every other day. 45 capsule 3   senna-docusate (SENOKOT-S) 8.6-50 MG tablet Take 1 tablet by mouth at bedtime as needed for mild constipation.     sertraline  (ZOLOFT ) 50 MG tablet TAKE 3 TABLETS(150 MG) BY MOUTH DAILY 270 tablet 2   tamsulosin  (FLOMAX ) 0.4 MG CAPS capsule Take 0.4 mg by mouth at bedtime.     [Paused] aspirin  EC 81 MG tablet Take 81 mg by mouth daily. Swallow whole. (Patient not taking: Reported on 09/07/2024)     [Paused] clopidogrel  (PLAVIX ) 75 MG tablet Take 75 mg by mouth daily. (Patient not taking: Reported on 09/07/2024)     nitroGLYCERIN  (NITROSTAT ) 0.4 MG SL tablet Place 0.4 mg under the tongue as needed for chest pain. (Patient not taking: Reported on 07/23/2024)      Past Medical History:  Diagnosis Date   Abdominal aneurysm (HCC)    Anginal pain (HCC)    Coronary artery disease    Depression    Heart disease    History of kidney stones    Hyperlipidemia    Hypertension    Myocardial infarction [facility]) 04/19/2023   NSTEMI   Stroke [facility]) 2004?   TIA    Past Surgical History:  Procedure Laterality Date   COLONOSCOPY     CORONARY ANGIOGRAPHY N/A 12/29/2022   Procedure: CORONARY ANGIOGRAPHY;  Surgeon: Court Dorn PARAS, MD;  Location: [facility] INVASIVE CV LAB;  Service: Cardiovascular;  Laterality: N/A;   CORONARY ARTERY BYPASS GRAFT N/A 05/22/2023   Procedure: CORONARY ARTERY BYPASS GRAFTING (CABG) X 2 WITH LEFT INTERNAL MAMMARY ARTERY HARVEST AND GREATER SAPHENOUS VEIN HARVESTED ENDOSCOPICALLY;  Surgeon: Kerrin Elspeth BROCKS, MD;  Location: [facility] OR;  Service: Open Heart Surgery;  Laterality: N/A;   ESOPHAGOGASTRODUODENOSCOPY (EGD) WITH PROPOFOL  N/A 05/27/2024   Procedure: ESOPHAGOGASTRODUODENOSCOPY (EGD) WITH PROPOFOL ;  Surgeon: Legrand Victory LITTIE DOUGLAS, MD;  Location: [facility] ENDOSCOPY;  Service: Gastroenterology;  Laterality: N/A;   EYE SURGERY     cataract right   IR ANGIO INTRA EXTRACRAN  SEL COM CAROTID INNOMINATE BILAT MOD SED  02/28/2018   IR ANGIO VERTEBRAL SEL SUBCLAVIAN INNOMINATE UNI R MOD SED  02/28/2018   IR RADIOLOGIST EVAL & MGMT  02/12/2021   LEFT HEART CATH AND CORONARY ANGIOGRAPHY N/A 12/26/2022   Procedure: LEFT HEART CATH AND CORONARY ANGIOGRAPHY;  Surgeon: Court Dorn PARAS, MD;  Location: [facility] INVASIVE CV LAB;  Service: Cardiovascular;  Laterality: N/A;   LEFT HEART CATH AND CORONARY ANGIOGRAPHY N/A 05/16/2023   Procedure: LEFT HEART CATH AND CORONARY ANGIOGRAPHY;  Surgeon: Dann Candyce RAMAN, MD;  Location: [facility] INVASIVE CV LAB;  Service: Cardiovascular;  Laterality: N/A;   OTHER SURGICAL HISTORY     Carotid    RADIOLOGY WITH ANESTHESIA N/A 02/28/2018   Procedure: STENTING;  Surgeon: Dolphus Carrion, MD;  Location: [facility] OR;  Service: Radiology;  Laterality: N/A;   TEE WITHOUT CARDIOVERSION N/A 05/22/2023   Procedure: TRANSESOPHAGEAL ECHOCARDIOGRAM;  Surgeon: Kerrin Elspeth BROCKS, MD;  Location: [facility] OR;  Service: Open Heart Surgery;  Laterality: N/A;   VIDEO  BRONCHOSCOPY WITH ENDOBRONCHIAL NAVIGATION N/A 04/27/2023   Procedure: VIDEO BRONCHOSCOPY WITH ENDOBRONCHIAL NAVIGATION W/ BIOPSIES;  Surgeon: Kerrin Elspeth BROCKS, MD;  Location: [facility] OR;  Service: Thoracic;  Laterality: N/A;    Social History   Tobacco Use   Smoking status: Former    Types: Cigarettes   Smokeless tobacco: Never  Vaping Use   Vaping status: Never Used  Substance Use Topics   Alcohol use: No   Drug use: No    Family History  Problem Relation Age of Onset   Breast cancer Mother    Heart disease Mother    Heart disease Brother    Tremor Neg Hx     PE: Vitals:   09/07/24 0920 09/07/24 1300 09/07/24 1417 09/07/24 1514  BP: 138/70  (!) 142/70   Pulse: 72  68   Resp: 20  20   Temp: 98.3 F (36.8 C) 98.2 F (36.8 C)    TempSrc: Oral     SpO2: 98% 100% 97% 95%  Weight:      Height:       Patient appears to be in no acute distress  Patient is confused Atraumatic  normocephalic head No cervical or supraclavicular lymphadenopathy appreciated No increased work of breathing, no audible wheezes/rhonchi Regular sinus rhythm/rate Abdomen is soft, nontender, nondistended, no CVA or suprapubic tenderness Easily reduced foreskin, balanitis on the dorsum of the glans penis Lower extremities are symmetric without appreciable edema Grossly neurologically intact No identifiable skin lesions  Recent Labs    09/06/24 1631 09/07/24 1151  WBC 13.0* 11.6*  HGB 13.8 13.8  HCT 41.7 42.2   Recent Labs    09/06/24 1631 09/07/24 1321  NA 136 133*  K 4.2 4.5  CL 99 98  CO2 23 22  GLUCOSE 191* 171*  BUN 12 12  CREATININE 1.30* 1.14  CALCIUM  9.9 9.2   No results for input(s): LABPT, INR in the last 72 hours. No results for input(s): LABURIN in the last 72 hours. Results for orders placed or performed during the hospital encounter of 09/06/24  Resp panel by RT-PCR (RSV, Flu A&B, Covid) Anterior Nasal Swab     Status: None   Collection Time: 09/06/24  6:40 PM   Specimen: Anterior Nasal Swab  Result Value Ref Range Status   SARS Coronavirus 2 by RT PCR NEGATIVE NEGATIVE Final    Comment: (NOTE) SARS-CoV-2 target nucleic acids are NOT DETECTED.  The SARS-CoV-2 RNA is generally detectable in upper respiratory specimens during the acute phase of infection. The lowest concentration of SARS-CoV-2 viral copies this assay can detect is 138 copies/mL. A negative result does not preclude SARS-Cov-2 infection and should not be used as the sole basis for treatment or other patient management decisions. A negative result may occur with  improper specimen collection/handling, submission of specimen other than nasopharyngeal swab, presence of viral mutation(s) within the areas targeted by this assay, and inadequate number of viral copies(<138 copies/mL). A negative result must be combined with clinical observations, patient history, and  epidemiological information. The expected result is Negative.  Fact Sheet for Patients:  BloggerCourse.com  Fact Sheet for Healthcare Providers:  SeriousBroker.it  This test is no t yet approved or cleared by the [place]  FDA and  has been authorized for detection and/or diagnosis of SARS-CoV-2 by FDA under an Emergency Use Authorization (EUA). This EUA will remain  in effect (meaning this test can be used) for the duration of the COVID-19 declaration under Section 564(b)(1) of the Act,  21 U.S.C.section 360bbb-3(b)(1), unless the authorization is terminated  or revoked sooner.       Influenza A by PCR NEGATIVE NEGATIVE Final   Influenza B by PCR NEGATIVE NEGATIVE Final    Comment: (NOTE) The Xpert Xpress SARS-CoV-2/FLU/RSV plus assay is intended as an aid in the diagnosis of influenza from Nasopharyngeal swab specimens and should not be used as a sole basis for treatment. Nasal washings and aspirates are unacceptable for Xpert Xpress SARS-CoV-2/FLU/RSV testing.  Fact Sheet for Patients: BloggerCourse.com  Fact Sheet for Healthcare Providers: SeriousBroker.it  This test is not yet approved or cleared by the [place]  FDA and has been authorized for detection and/or diagnosis of SARS-CoV-2 by FDA under an Emergency Use Authorization (EUA). This EUA will remain in effect (meaning this test can be used) for the duration of the COVID-19 declaration under Section 564(b)(1) of the Act, 21 U.S.C. section 360bbb-3(b)(1), unless the authorization is terminated or revoked.     Resp Syncytial Virus by PCR NEGATIVE NEGATIVE Final    Comment: (NOTE) Fact Sheet for Patients: BloggerCourse.com  Fact Sheet for Healthcare Providers: SeriousBroker.it  This test is not yet approved or cleared by the [place]  FDA and has been  authorized for detection and/or diagnosis of SARS-CoV-2 by FDA under an Emergency Use Authorization (EUA). This EUA will remain in effect (meaning this test can be used) for the duration of the COVID-19 declaration under Section 564(b)(1) of the Act, 21 U.S.C. section 360bbb-3(b)(1), unless the authorization is terminated or revoked.  Performed at [facility], [address], [place], [place] [address]     Imaging: none  Imp: Mild phimosis, easily reduced.  Balanitis of the glans penis.  Recommendations: Ensure that the patient has adequate hygiene and the foreskin is reduced back with any cleansing of the glans penis.  In addition I would recommend a steroid ointment to the balanitis on the head of his penis twice daily. -Clobetazole 0.5% cream, apply to affected area twice daily (or pharmacy equivalent)   Thank you for involving me in this patient's care.Please page with any further questions or concerns. Morene LELON Salines

## 2024-09-08 DIAGNOSIS — G9340 Encephalopathy, unspecified: Secondary | ICD-10-CM | POA: Diagnosis not present

## 2024-09-08 LAB — CBC
HCT: 35.5 % — ABNORMAL LOW (ref 39.0–52.0)
Hemoglobin: 11.5 g/dL — ABNORMAL LOW (ref 13.0–17.0)
MCH: 30.9 pg (ref 26.0–34.0)
MCHC: 32.4 g/dL (ref 30.0–36.0)
MCV: 95.4 fL (ref 80.0–100.0)
Platelets: 169 K/uL (ref 150–400)
RBC: 3.72 MIL/uL — ABNORMAL LOW (ref 4.22–5.81)
RDW: 13.7 % (ref 11.5–15.5)
WBC: 9.9 K/uL (ref 4.0–10.5)
nRBC: 0 % (ref 0.0–0.2)

## 2024-09-08 LAB — HEPATITIS PANEL, ACUTE
HCV Ab: NONREACTIVE
Hep A IgM: NONREACTIVE
Hep B C IgM: NONREACTIVE
Hepatitis B Surface Ag: NONREACTIVE

## 2024-09-08 LAB — COMPREHENSIVE METABOLIC PANEL WITH GFR
ALT: 61 U/L — ABNORMAL HIGH (ref 0–44)
AST: 95 U/L — ABNORMAL HIGH (ref 15–41)
Albumin: 2.9 g/dL — ABNORMAL LOW (ref 3.5–5.0)
Alkaline Phosphatase: 98 U/L (ref 38–126)
Anion gap: 12 (ref 5–15)
BUN: 16 mg/dL (ref 8–23)
CO2: 20 mmol/L — ABNORMAL LOW (ref 22–32)
Calcium: 8.9 mg/dL (ref 8.9–10.3)
Chloride: 101 mmol/L (ref 98–111)
Creatinine, Ser: 1.14 mg/dL (ref 0.61–1.24)
GFR, Estimated: >60 mL/min (ref >60)
Glucose, Bld: 125 mg/dL — ABNORMAL HIGH (ref 70–99)
Potassium: 4.1 mmol/L (ref 3.5–5.1)
Sodium: 133 mmol/L — ABNORMAL LOW (ref 135–145)
Total Bilirubin: 1.1 mg/dL (ref 0.0–1.2)
Total Protein: 6.4 g/dL — ABNORMAL LOW (ref 6.5–8.1)

## 2024-09-08 LAB — T4, FREE: Free T4: 1.11 ng/dL (ref 0.61–1.12)

## 2024-09-08 LAB — PROTIME-INR
INR: 1.2 (ref 0.8–1.2)
Prothrombin Time: 15.9 s — ABNORMAL HIGH (ref 11.4–15.2)

## 2024-09-08 LAB — GLUCOSE, CAPILLARY
Glucose-Capillary: 127 mg/dL — ABNORMAL HIGH (ref 70–99)
Glucose-Capillary: 166 mg/dL — ABNORMAL HIGH (ref 70–99)
Glucose-Capillary: 219 mg/dL — ABNORMAL HIGH (ref 70–99)
Glucose-Capillary: 259 mg/dL — ABNORMAL HIGH (ref 70–99)

## 2024-09-08 LAB — AMMONIA: Ammonia: 52 umol/L — ABNORMAL HIGH (ref 9–35)

## 2024-09-08 MED ORDER — SODIUM CHLORIDE 0.9 % IV SOLN: INTRAVENOUS | Status: DC

## 2024-09-08 MED ORDER — LACTULOSE 10 GM/15ML PO SOLN
20.0000 g | Freq: Two times a day (BID) | ORAL | Status: DC
  Administered 2024-09-08 – 2024-09-11 (×6): 20 g via ORAL
  Filled 2024-09-08 (×6): qty 30

## 2024-09-08 MED ORDER — AZITHROMYCIN 250 MG PO TABS
500.0000 mg | ORAL_TABLET | Freq: Every day | ORAL | Status: AC
  Administered 2024-09-08: 500 mg via ORAL
  Filled 2024-09-08: qty 2

## 2024-09-08 MED ORDER — AZITHROMYCIN 250 MG PO TABS
250.0000 mg | ORAL_TABLET | Freq: Every day | ORAL | Status: DC
  Administered 2024-09-09 – 2024-09-11 (×3): 250 mg via ORAL
  Filled 2024-09-08 (×3): qty 1

## 2024-09-08 MED ORDER — LACTULOSE 10 GM/15ML PO SOLN
30.0000 g | Freq: Two times a day (BID) | ORAL | Status: DC
  Administered 2024-09-08: 30 g via ORAL
  Filled 2024-09-08: qty 45

## 2024-09-08 NOTE — Plan of Care (Signed)
  Problem: Education: Goal: Knowledge of General Education information will improve Description: Including pain rating scale, medication(s)/side effects and non-pharmacologic comfort measures Outcome: Progressing   Problem: Health Behavior/Discharge Planning: Goal: Ability to manage health-related needs will improve Outcome: Progressing   Problem: Clinical Measurements: Goal: Ability to maintain clinical measurements within normal limits will improve Outcome: Progressing Goal: Will remain free from infection Outcome: Progressing Goal: Diagnostic test results will improve Outcome: Progressing Goal: Respiratory complications will improve Outcome: Progressing Goal: Cardiovascular complication will be avoided Outcome: Progressing   Problem: Activity: Goal: Risk for activity intolerance will decrease Outcome: Progressing   Problem: Nutrition: Goal: Adequate nutrition will be maintained Outcome: Progressing   Problem: Coping: Goal: Level of anxiety will decrease Outcome: Progressing   Problem: Elimination: Goal: Will not experience complications related to bowel motility Outcome: Progressing Goal: Will not experience complications related to urinary retention Outcome: Progressing   Problem: Pain Managment: Goal: General experience of comfort will improve and/or be controlled Outcome: Progressing   Problem: Safety: Goal: Ability to remain free from injury will improve Outcome: Progressing   Problem: Skin Integrity: Goal: Risk for impaired skin integrity will decrease Outcome: Progressing   Problem: Education: Goal: Ability to describe self-care measures that may prevent or decrease complications (Diabetes Survival Skills Education) will improve Outcome: Progressing Goal: Individualized Educational Video(s) Outcome: Progressing   Problem: Coping: Goal: Ability to adjust to condition or change in health will improve Outcome: Progressing   Problem: Fluid  Volume: Goal: Ability to maintain a balanced intake and output will improve Outcome: Progressing   Problem: Health Behavior/Discharge Planning: Goal: Ability to identify and utilize available resources and services will improve Outcome: Progressing Goal: Ability to manage health-related needs will improve Outcome: Progressing   Problem: Metabolic: Goal: Ability to maintain appropriate glucose levels will improve Outcome: Progressing   Problem: Nutritional: Goal: Maintenance of adequate nutrition will improve Outcome: Progressing Goal: Progress toward achieving an optimal weight will improve Outcome: Progressing   Problem: Skin Integrity: Goal: Risk for impaired skin integrity will decrease Outcome: Progressing   Problem: Tissue Perfusion: Goal: Adequacy of tissue perfusion will improve Outcome: Progressing   Problem: Education: Goal: Knowledge of disease or condition will improve Outcome: Progressing Goal: Knowledge of the prescribed therapeutic regimen will improve Outcome: Progressing Goal: Individualized Educational Video(s) Outcome: Progressing   Problem: Activity: Goal: Ability to tolerate increased activity will improve Outcome: Progressing Goal: Will verbalize the importance of balancing activity with adequate rest periods Outcome: Progressing   Problem: Respiratory: Goal: Ability to maintain a clear airway will improve Outcome: Progressing Goal: Levels of oxygenation will improve Outcome: Progressing Goal: Ability to maintain adequate ventilation will improve Outcome: Progressing

## 2024-09-08 NOTE — Evaluation (Signed)
 Physical Therapy Evaluation Patient Details Name: Gregory [name] MRN: [id number] DOB: 1934-02-07 Today's Date: 09/08/2024  History of Present Illness  89 y.o. male with medical history significant of abdominal aneurysm anginal pain, CAD s/p CABG, NSTEMI, depression, kidney disease, hyperlipidemia, hypertension, CVA, history of small subarachnoid hemorrhage after having a fall on July 31.  He was also admitted due to bilateral subdural hematomas.  He has been slowly declining since these hospitalizations.  According to the patient's son he has been staying in bed most of the day.  He has been having difficulty remembering how to do basic tasks.  He used to ambulate with a walker and make transfers without assistance, but has had a significant decline over the last few days and is unable to get up without the assistance of 2 persons.  Pt admitted 09/06/24 for work up of acute encephalopathy and dyspnea.  Clinical Impression  Pt admitted with above diagnosis.  Pt currently with functional limitations due to the deficits listed below (see PT Problem List). Pt will benefit from acute skilled PT to increase their independence and safety with mobility to allow discharge.  Pt presents with generalized weakness, impaired cognition, and balance deficits.  Pt's son present and has been assisting pt at home.  Son reports pt requiring increased physical assist approx a week prior to admission however typically able to ambulate with RW and CGA since recent admission a month ago.  Pt has been working with [facility] PT and OT.  Son reports he lives with pt and also his mother.  Pt's spouse currently requiring assist as well.  Patient will benefit from continued inpatient follow up therapy, <3 hours/day.  Will update recommendations if/when pt progresses.          If plan is discharge home, recommend the following: A lot of help with walking and/or transfers;A lot of help with bathing/dressing/bathroom;Assistance with  cooking/housework;Assist for transportation;Help with stairs or ramp for entrance;Supervision due to cognitive status   Can travel by private vehicle        Equipment Recommendations None recommended by PT  Recommendations for Other Services       Functional Status Assessment Patient has had a recent decline in their functional status and demonstrates the ability to make significant improvements in function in a reasonable and predictable amount of time.     Precautions / Restrictions Precautions Precautions: Fall Recall of Precautions/Restrictions: Impaired      Mobility  Bed Mobility Overal bed mobility: Needs Assistance Bed Mobility: Supine to Sit     Supine to sit: Mod assist, HOB elevated     General bed mobility comments: pt initiating with step by step cues, requiring assist for upper body and scooting to EOB    Transfers Overall transfer level: Needs assistance Equipment used: Rolling walker (2 wheels) Transfers: Sit to/from Stand Sit to Stand: Mod assist           General transfer comment: verbal cues for hand placement, assist to rise and stabilize    Ambulation/Gait Ambulation/Gait assistance: Min assist Gait Distance (Feet): 50 Feet Assistive device: Rolling walker (2 wheels) Gait Pattern/deviations: Step-through pattern, Decreased stride length, Trunk flexed Gait velocity: decr     General Gait Details: verbal cues for RW positioning and posture; tends to have increased trunk flexion and keep RW too far forward despite cues; min assist for any challenges including turning  Careers information officer  Tilt Bed    Modified Rankin (Stroke Patients Only)       Balance Overall balance assessment: History of Falls, Needs assistance         Standing balance support: Bilateral upper extremity supported, During functional activity, Reliant on assistive device for balance Standing balance-Leahy Scale: Poor                                Pertinent Vitals/Pain Pain Assessment Pain Assessment: No/denies pain    Home Living Family/patient expects to be discharged to:: Private residence Living Arrangements: Spouse/significant other;Children (son) Available Help at Discharge: Family;Available 24 hours/day Type of Home: House Home Access: Stairs to enter Entrance Stairs-Rails: Left Entrance Stairs-Number of Steps: 4   Home Layout: Multi-level;Able to live on main level with bedroom/bathroom Home Equipment: Rolling Walker (2 wheels);Shower seat;Hand held shower head      Prior Function Prior Level of Function : Needs assist             Mobility Comments: son reports pt was requiring another person ambulate with him for safety; uses RW, has had 3 falls in the past month; son reports pt requiring more physical assist approx a week leading up to admission ADLs Comments: son reports home services such as RN and OT have been assisting more with ADLs     Extremity/Trunk Assessment        Lower Extremity Assessment Lower Extremity Assessment: Generalized weakness    Cervical / Trunk Assessment Cervical / Trunk Assessment: Kyphotic  Communication   Communication Communication: Impaired Factors Affecting Communication: Hearing impaired    Cognition Arousal: Alert Behavior During Therapy: Flat affect                           PT - Cognition Comments: son reports pt's cognition improving since admission however not at baseline; pt able to state his name and oriented to place, able to follow simple commands Following commands: Intact       Cueing Cueing Techniques: Verbal cues, Tactile cues, Gestural cues, Visual cues     General Comments      Exercises     Assessment/Plan    PT Assessment Patient needs continued PT services  PT Problem List Decreased mobility;Decreased balance;Decreased activity tolerance;Decreased strength;Decreased knowledge of use of  DME;Decreased safety awareness;Decreased skin integrity;Decreased cognition       PT Treatment Interventions Gait training;DME instruction;Balance training;Neuromuscular re-education;Functional mobility training;Therapeutic activities;Patient/family education;Therapeutic exercise    PT Goals (Current goals can be found in the Care Plan section)  Acute Rehab PT Goals PT Goal Formulation: With patient/family Time For Goal Achievement: 09/22/24 Potential to Achieve Goals: Good    Frequency Min 2X/week     Co-evaluation               AM-PAC PT 6 Clicks Mobility  Outcome Measure Help needed turning from your back to your side while in a flat bed without using bedrails?: A Lot Help needed moving from lying on your back to sitting on the side of a flat bed without using bedrails?: A Lot Help needed moving to and from a bed to a chair (including a wheelchair)?: A Lot Help needed standing up from a chair using your arms (e.g., wheelchair or bedside chair)?: A Lot Help needed to walk in hospital room?: A Lot Help needed climbing 3-5 steps with a railing? : A Lot 6 Click  Score: 12    End of Session Equipment Utilized During Treatment: Gait belt Activity Tolerance: Patient tolerated treatment well Patient left: in chair;with chair alarm set;with family/visitor present;with call bell/phone within reach Nurse Communication: Mobility status PT Visit Diagnosis: Difficulty in walking, not elsewhere classified (R26.2);Muscle weakness (generalized) (M62.81)    Time: 8874-8856 PT Time Calculation (min) (ACUTE ONLY): 18 min   Charges:   PT Evaluation $PT Eval Low Complexity: 1 Low   PT General Charges $$ ACUTE PT VISIT: 1 Visit       Tari PT, DPT Physical Therapist Acute Rehabilitation Services Office: [phone]   Kati L Payson 09/08/2024, 1:09 PM

## 2024-09-08 NOTE — Progress Notes (Addendum)
 PROGRESS NOTE    Gregory [name]  FMW:[id number] DOB: 15-Dec-1934 DOA: 09/06/2024 PCP: Tanda Prentice DEL, MD   Brief Narrative: 89 year old with past medical history significant for abdominal aneurysmal, angina, CAD, history of non-STEMI, depression, kidney disease, hyperlipidemia, hypertension, history of CVA, history of small subarachnoid hemorrhage after having a fall in July, bilateral subdural hematoma he has been slowly declining since those hospitalizations.  Patient has been staying in bed most of the day, having difficulty remembering how to do basic task.  He used to ambulate with a walker but has significantly declined over the last few days and is unable to get up without assistance.  Patient presented with leukocytosis, ammonia level elevated at 51, TSH elevated at 5.7 creatinine 1.3.  CT head showed old right frontal infarct, atrophy resolution of previous low-density left subdural hematoma.  Assessment & Plan:   Principal Problem:   Acute encephalopathy Active Problems:   Essential hypertension   Stage 3a chronic kidney disease (CKD) (HCC)   AAA (abdominal aortic aneurysm) without rupture (HCC)   Carotid artery disease (HCC)   Hyperlipidemia   Generalized weakness   GAD (generalized anxiety disorder)   BPH (benign prostatic hyperplasia)   Chronic diastolic CHF (congestive heart failure) (HCC)   Elevated LFTs   Pressure injury of ankle, stage 2 (HCC)   CAD (coronary artery disease)   PVD (peripheral vascular disease) (HCC)   Phimosis of penis   Balanitis   Hyperammonemia (HCC)   Constipation   Dyspnea   Reactive airways dysfunction syndrome (HCC)   Type 2 diabetes mellitus with hyperglycemia (HCC)   Hepatic steatosis  1-Acute hepatic encephalopathy Hyperammonemia Generalized weakness - Suspect in the setting of elevated ammonia level. - Started on lactulose , continue He is alert and conversant this morning oriented x 3  Reactive airway dysfunction  syndrome: Dyspnea - Received IV methylprednisolone .  Not on prednisone . - On a scheduled DuoNeb -Chest x ray: Increased size of the small dependent right pleural effusion. Small amount of loculated fluid in the upper right oblique fissure posteriorly is unchanged. - Start azithromycin  -Denies worsening dyspnea  Phimosis of the penis Balanitis - Continue topical steroid for balanitis  Elevated TSH; check free T3 and  T 4  Hypertension -on Imdur   Stage IIIa CKD - Monitor renal function  Carotid artery disease, CAD, PVD: - Currently off of antiplatelet therapy.  Continue with nitrates daily  Transaminases; - History of hepatic steatosis - Hold statin - Check hepatitis panel -Had a previous ultrasound right upper quadrant 80/04/2024 that was negative for cholelithiasis only showed hepatic asteatosis. He has had chronic elevation of liver function tests over the last 4 months. Will need to follow-up as an outpatient. INR normal  Diabetes type 2 with hyperglycemia: - A1c;  7.6 SSI Could discharge on low dose metformin  Hyperlipidemia - Holding statin due to transaminases - Continue sliding scale insulin   GAD: - Continue sertraline  and mirtazapine   BPH - Continue Flomax   Chronic diastolic heart failure - Continue Imdur  - Stop IV fluid  Injury stage II sacral - Local care  Constipation -Started on lactulose   AAA - Follow imaging as an outpatient     Estimated body mass index is 26.48 kg/m as calculated from the following:   Height as of this encounter: 6' 0.02 (1.829 m).   Weight as of this encounter: 88.6 kg.   DVT prophylaxis: Lovenox  Code Status: Full code Family Communication: Son updated at bedside.  Disposition Plan:  Status is: Observation The patient  remains OBS appropriate and will d/c before 2 midnights.    Consultants:  None  Procedures:  None  Antimicrobials:    Subjective: He is alert and conversant today, oriented x 3.   Denies pain.  Objective: Vitals:   09/07/24 1514 09/07/24 2006 09/07/24 2050 09/08/24 0415  BP:   110/75 128/67  Pulse:   72 64  Resp:   18 18  Temp:   98.7 F (37.1 C) 97.9 F (36.6 C)  TempSrc:   Oral Oral  SpO2: 95% 92% 92% 92%  Weight:      Height:        Intake/Output Summary (Last 24 hours) at 09/08/2024 0744 Last data filed at 09/07/2024 1700 Gross per 24 hour  Intake 610.97 ml  Output 250 ml  Net 360.97 ml   Filed Weights   09/07/24 0053  Weight: 88.6 kg    Examination:  General exam: Appears calm and comfortable  Respiratory system: Bilateral rhonchorous. Respiratory effort normal. Cardiovascular system: S1 & S2 heard, RRR.  Gastrointestinal system: Abdomen is nondistended, soft and nontender. No organomegaly or masses felt. Normal bowel sounds heard. Central nervous system: Alert and oriented.  Extremities: Symmetric 5 x 5 power.    Data Reviewed: I have personally reviewed following labs and imaging studies  CBC: Recent Labs  Lab 09/06/24 1631 09/07/24 1151 09/08/24 0641  WBC 13.0* 11.6* 9.9  NEUTROABS 9.4*  --   --   HGB 13.8 13.8 11.5*  HCT 41.7 42.2 35.5*  MCV 92.7 94.2 95.4  PLT 228 179 169   Basic Metabolic Panel: Recent Labs  Lab 09/06/24 1631 09/07/24 1321  NA 136 133*  K 4.2 4.5  CL 99 98  CO2 23 22  GLUCOSE 191* 171*  BUN 12 12  CREATININE 1.30* 1.14  CALCIUM  9.9 9.2  MG  --  1.7  PHOS  --  2.5   GFR: Estimated Creatinine Clearance: 50.1 mL/min (by C-G formula based on SCr of 1.14 mg/dL). Liver Function Tests: Recent Labs  Lab 09/06/24 1631 09/07/24 1321  AST 111* 90*  ALT 71* 60*  ALKPHOS 149* 141*  BILITOT 1.2 1.0  PROT 8.1 7.4  ALBUMIN  3.7 3.3*   No results for input(s): LIPASE, AMYLASE in the last 168 hours. Recent Labs  Lab 09/06/24 2114 09/07/24 1151  AMMONIA 51* 68*   Coagulation Profile: No results for input(s): INR, PROTIME in the last 168 hours. Cardiac Enzymes: No results for input(s):  CKTOTAL, CKMB, CKMBINDEX, TROPONINI in the last 168 hours. BNP (last 3 results) No results for input(s): PROBNP in the last 8760 hours. HbA1C: Recent Labs    09/07/24 1151  HGBA1C 7.6*   CBG: Recent Labs  Lab 09/07/24 1207 09/07/24 1649 09/07/24 2124 09/08/24 0714  GLUCAP 172* 211* 239* 127*   Lipid Profile: No results for input(s): CHOL, HDL, LDLCALC, TRIG, CHOLHDL, LDLDIRECT in the last 72 hours. Thyroid Function Tests: Recent Labs    09/06/24 2114  TSH 5.750*   Anemia Panel: No results for input(s): VITAMINB12, FOLATE, FERRITIN, TIBC, IRON, RETICCTPCT in the last 72 hours. Sepsis Labs: Recent Labs  Lab 09/07/24 1321  PROCALCITON 0.23    Recent Results (from the past 240 hours)  Resp panel by RT-PCR (RSV, Flu A&B, Covid) Anterior Nasal Swab     Status: None   Collection Time: 09/06/24  6:40 PM   Specimen: Anterior Nasal Swab  Result Value Ref Range Status   SARS Coronavirus 2 by RT PCR NEGATIVE NEGATIVE Final  Comment: (NOTE) SARS-CoV-2 target nucleic acids are NOT DETECTED.  The SARS-CoV-2 RNA is generally detectable in upper respiratory specimens during the acute phase of infection. The lowest concentration of SARS-CoV-2 viral copies this assay can detect is 138 copies/mL. A negative result does not preclude SARS-Cov-2 infection and should not be used as the sole basis for treatment or other patient management decisions. A negative result may occur with  improper specimen collection/handling, submission of specimen other than nasopharyngeal swab, presence of viral mutation(s) within the areas targeted by this assay, and inadequate number of viral copies(<138 copies/mL). A negative result must be combined with clinical observations, patient history, and epidemiological information. The expected result is Negative.  Fact Sheet for Patients:  BloggerCourse.com  Fact Sheet for Healthcare Providers:   SeriousBroker.it  This test is no t yet approved or cleared by the [place]  FDA and  has been authorized for detection and/or diagnosis of SARS-CoV-2 by FDA under an Emergency Use Authorization (EUA). This EUA will remain  in effect (meaning this test can be used) for the duration of the COVID-19 declaration under Section 564(b)(1) of the Act, 21 U.S.C.section 360bbb-3(b)(1), unless the authorization is terminated  or revoked sooner.       Influenza A by PCR NEGATIVE NEGATIVE Final   Influenza B by PCR NEGATIVE NEGATIVE Final    Comment: (NOTE) The Xpert Xpress SARS-CoV-2/FLU/RSV plus assay is intended as an aid in the diagnosis of influenza from Nasopharyngeal swab specimens and should not be used as a sole basis for treatment. Nasal washings and aspirates are unacceptable for Xpert Xpress SARS-CoV-2/FLU/RSV testing.  Fact Sheet for Patients: BloggerCourse.com  Fact Sheet for Healthcare Providers: SeriousBroker.it  This test is not yet approved or cleared by the [place]  FDA and has been authorized for detection and/or diagnosis of SARS-CoV-2 by FDA under an Emergency Use Authorization (EUA). This EUA will remain in effect (meaning this test can be used) for the duration of the COVID-19 declaration under Section 564(b)(1) of the Act, 21 U.S.C. section 360bbb-3(b)(1), unless the authorization is terminated or revoked.     Resp Syncytial Virus by PCR NEGATIVE NEGATIVE Final    Comment: (NOTE) Fact Sheet for Patients: BloggerCourse.com  Fact Sheet for Healthcare Providers: SeriousBroker.it  This test is not yet approved or cleared by the [place]  FDA and has been authorized for detection and/or diagnosis of SARS-CoV-2 by FDA under an Emergency Use Authorization (EUA). This EUA will remain in effect (meaning this test can be used) for  the duration of the COVID-19 declaration under Section 564(b)(1) of the Act, 21 U.S.C. section 360bbb-3(b)(1), unless the authorization is terminated or revoked.  Performed at [facility], [address] [place], [place] [address]          Radiology Studies: CT Head Wo Contrast Result Date: 09/06/2024 CLINICAL DATA:  Mental status change, unknown cause.  Confusion. EXAM: CT HEAD WITHOUT CONTRAST TECHNIQUE: Contiguous axial images were obtained from the base of the skull through the vertex without intravenous contrast. RADIATION DOSE REDUCTION: This exam was performed according to the departmental dose-optimization program which includes automated exposure control, adjustment of the mA and/or kV according to patient size and/or use of iterative reconstruction technique. COMPARISON:  08/09/2024 FINDINGS: Brain: The previously seen low-density left cerebral convexity subdural hematoma has resolved. Old right frontal infarct, stable. No acute intracranial abnormality. Specifically, no hemorrhage, hydrocephalus, mass lesion, acute infarction, or significant intracranial injury. There is atrophy and chronic small vessel disease changes. Vascular: No  hyperdense vessel or unexpected calcification. Skull: No acute calvarial abnormality. Sinuses/Orbits: No acute findings Other: None IMPRESSION: Atrophy, chronic microvascular disease. No acute intracranial abnormality. Old right frontal infarct. Resolution of the previously seen low-density left subdural hematoma. Electronically Signed   By: Franky Crease M.D.   On: 09/06/2024 19:08   DG Chest 2 View Result Date: 09/06/2024 CLINICAL DATA:  SHOB EXAM: CHEST - 2 VIEW COMPARISON:  08/08/2024 FINDINGS: Focal airspace consolidation in the right lung apex, unchanged, consistent with region of prior scarring. No new airspace consolidation or pneumothorax. Increased size of the small right pleural effusion. The small amount of loculated fluid in  the right oblique fissure is unchanged. No cardiomegaly. Sternotomy wires. Aortic atherosclerosis. No acute fracture or destructive lesions. Multilevel thoracic osteophytosis. IMPRESSION: Increased size of the small dependent right pleural effusion. Small amount of loculated fluid in the upper right oblique fissure posteriorly is unchanged. Electronically Signed   By: Rogelia Myers M.D.   On: 09/06/2024 16:52        Scheduled Meds:  amiodarone   200 mg Oral Daily   enoxaparin  (LOVENOX ) injection  40 mg Subcutaneous Q24H   guaiFENesin   600 mg Oral BID   insulin  aspart  0-9 Units Subcutaneous TID WC   ipratropium-albuterol   3 mL Nebulization BID   isosorbide  mononitrate  15 mg Oral Daily   lactulose   30 g Oral BID   levothyroxine   25 mcg Oral QAC breakfast   mirtazapine   7.5 mg Oral QHS   predniSONE   40 mg Oral Q breakfast   sertraline   150 mg Oral Daily   tamsulosin   0.4 mg Oral QHS   triamcinolone    Topical BID   Continuous Infusions:   LOS: 0 days    Time spent: 35 minutes    Kiyra Slaubaugh A Jearld Hemp, MD [facility]   If 7PM-7AM, please contact night-coverage [email or web address]  09/08/2024, 7:44 AM

## 2024-09-09 DIAGNOSIS — L89152 Pressure ulcer of sacral region, stage 2: Secondary | ICD-10-CM | POA: Diagnosis present

## 2024-09-09 DIAGNOSIS — Z8616 Personal history of COVID-19: Secondary | ICD-10-CM | POA: Diagnosis not present

## 2024-09-09 DIAGNOSIS — I714 Abdominal aortic aneurysm, without rupture, unspecified: Secondary | ICD-10-CM | POA: Diagnosis present

## 2024-09-09 DIAGNOSIS — K760 Fatty (change of) liver, not elsewhere classified: Secondary | ICD-10-CM | POA: Diagnosis present

## 2024-09-09 DIAGNOSIS — F319 Bipolar disorder, unspecified: Secondary | ICD-10-CM | POA: Diagnosis present

## 2024-09-09 DIAGNOSIS — G9340 Encephalopathy, unspecified: Secondary | ICD-10-CM | POA: Diagnosis present

## 2024-09-09 DIAGNOSIS — I130 Hypertensive heart and chronic kidney disease with heart failure and stage 1 through stage 4 chronic kidney disease, or unspecified chronic kidney disease: Secondary | ICD-10-CM | POA: Diagnosis present

## 2024-09-09 DIAGNOSIS — N481 Balanitis: Secondary | ICD-10-CM | POA: Diagnosis present

## 2024-09-09 DIAGNOSIS — E039 Hypothyroidism, unspecified: Secondary | ICD-10-CM | POA: Diagnosis present

## 2024-09-09 DIAGNOSIS — I482 Chronic atrial fibrillation, unspecified: Secondary | ICD-10-CM | POA: Diagnosis present

## 2024-09-09 DIAGNOSIS — I5032 Chronic diastolic (congestive) heart failure: Secondary | ICD-10-CM | POA: Diagnosis present

## 2024-09-09 DIAGNOSIS — F411 Generalized anxiety disorder: Secondary | ICD-10-CM | POA: Diagnosis present

## 2024-09-09 DIAGNOSIS — K7682 Hepatic encephalopathy: Secondary | ICD-10-CM | POA: Diagnosis present

## 2024-09-09 DIAGNOSIS — E1165 Type 2 diabetes mellitus with hyperglycemia: Secondary | ICD-10-CM | POA: Diagnosis present

## 2024-09-09 DIAGNOSIS — I2510 Atherosclerotic heart disease of native coronary artery without angina pectoris: Secondary | ICD-10-CM | POA: Diagnosis present

## 2024-09-09 DIAGNOSIS — E7220 Disorder of urea cycle metabolism, unspecified: Secondary | ICD-10-CM | POA: Diagnosis present

## 2024-09-09 DIAGNOSIS — N1831 Chronic kidney disease, stage 3a: Secondary | ICD-10-CM | POA: Diagnosis present

## 2024-09-09 DIAGNOSIS — J683 Other acute and subacute respiratory conditions due to chemicals, gases, fumes and vapors: Secondary | ICD-10-CM | POA: Diagnosis present

## 2024-09-09 DIAGNOSIS — E785 Hyperlipidemia, unspecified: Secondary | ICD-10-CM | POA: Diagnosis present

## 2024-09-09 DIAGNOSIS — F0394 Unspecified dementia, unspecified severity, with anxiety: Secondary | ICD-10-CM | POA: Diagnosis present

## 2024-09-09 DIAGNOSIS — L89502 Pressure ulcer of unspecified ankle, stage 2: Secondary | ICD-10-CM | POA: Diagnosis present

## 2024-09-09 DIAGNOSIS — F0393 Unspecified dementia, unspecified severity, with mood disturbance: Secondary | ICD-10-CM | POA: Diagnosis present

## 2024-09-09 DIAGNOSIS — E1151 Type 2 diabetes mellitus with diabetic peripheral angiopathy without gangrene: Secondary | ICD-10-CM | POA: Diagnosis present

## 2024-09-09 DIAGNOSIS — E1122 Type 2 diabetes mellitus with diabetic chronic kidney disease: Secondary | ICD-10-CM | POA: Diagnosis present

## 2024-09-09 DIAGNOSIS — Z1152 Encounter for screening for COVID-19: Secondary | ICD-10-CM | POA: Diagnosis not present

## 2024-09-09 LAB — BASIC METABOLIC PANEL WITH GFR
Anion gap: 15 (ref 5–15)
BUN: 19 mg/dL (ref 8–23)
CO2: 21 mmol/L — ABNORMAL LOW (ref 22–32)
Calcium: 9.3 mg/dL (ref 8.9–10.3)
Chloride: 102 mmol/L (ref 98–111)
Creatinine, Ser: 1.13 mg/dL (ref 0.61–1.24)
GFR, Estimated: >60 mL/min (ref >60)
Glucose, Bld: 127 mg/dL — ABNORMAL HIGH (ref 70–99)
Potassium: 4.1 mmol/L (ref 3.5–5.1)
Sodium: 139 mmol/L (ref 135–145)

## 2024-09-09 LAB — GLUCOSE, CAPILLARY
Glucose-Capillary: 128 mg/dL — ABNORMAL HIGH (ref 70–99)
Glucose-Capillary: 261 mg/dL — ABNORMAL HIGH (ref 70–99)
Glucose-Capillary: 221 mg/dL — ABNORMAL HIGH (ref 70–99)
Glucose-Capillary: 204 mg/dL — ABNORMAL HIGH (ref 70–99)

## 2024-09-09 LAB — CBC
HCT: 38.7 % — ABNORMAL LOW (ref 39.0–52.0)
Hemoglobin: 12.3 g/dL — ABNORMAL LOW (ref 13.0–17.0)
MCH: 30 pg (ref 26.0–34.0)
MCHC: 31.8 g/dL (ref 30.0–36.0)
MCV: 94.4 fL (ref 80.0–100.0)
Platelets: 196 K/uL (ref 150–400)
RBC: 4.1 MIL/uL — ABNORMAL LOW (ref 4.22–5.81)
RDW: 13.8 % (ref 11.5–15.5)
WBC: 10.4 K/uL (ref 4.0–10.5)
nRBC: 0 % (ref 0.0–0.2)

## 2024-09-09 LAB — T3, FREE: T3, Free: 1.6 pg/mL — ABNORMAL LOW (ref 2.0–4.4)

## 2024-09-09 LAB — AMMONIA: Ammonia: 38 umol/L — ABNORMAL HIGH (ref 9–35)

## 2024-09-09 MED ORDER — AMLODIPINE BESYLATE 5 MG PO TABS
5.0000 mg | ORAL_TABLET | Freq: Every day | ORAL | Status: DC
  Administered 2024-09-09 – 2024-09-11 (×3): 5 mg via ORAL
  Filled 2024-09-09 (×3): qty 1

## 2024-09-09 NOTE — NC FL2 (Signed)
 [place]  MEDICAID FL2 LEVEL OF CARE FORM     IDENTIFICATION  Patient Name: Gregory [name] Birthdate: January 09, 1934 Sex: male Admission Date (Current Location): 09/06/2024  [facility] and [place] Number:  Producer, television/film/video and Address:  [facility],  [address] [place], [place] [address]      Provider Number: [id number]  Attending Physician Name and Address:  Madelyne Owen LABOR, MD  Relative Name and Phone Number:  Sabir, Char[place])  [phone]    Current Level of Care: Hospital Recommended Level of Care: Skilled Nursing Facility Prior Approval Number:    Date Approved/Denied:   PASRR Number: [id number]  Discharge Plan: SNF    Current Diagnoses: Patient Active Problem List   Diagnosis Date Noted   Pressure injury of ankle, stage 2 (HCC) 09/07/2024   CAD (coronary artery disease) 09/07/2024   Phimosis of penis 09/07/2024   Balanitis 09/07/2024   Hyperammonemia 09/07/2024   Constipation 09/07/2024   Dyspnea 09/07/2024   Reactive airways dysfunction syndrome (HCC) 09/07/2024   Type 2 diabetes mellitus with hyperglycemia (HCC) 09/07/2024   Hepatic steatosis 09/07/2024   Acute encephalopathy 09/06/2024   Subdural hematoma without coma, without loss of consciousness, initial encounter (HCC) 07/23/2024   Abnormal urinalysis 07/23/2024   Elevated LFTs 07/23/2024   Malignant neoplasm of right upper lobe of lung (HCC) 06/24/2024   Esophageal dysphagia 05/27/2024   Abnormal CT of the chest 05/27/2024   COVID-19 virus infection 04/24/2024   Acute hypoxic respiratory failure (HCC) 04/24/2024   Acute metabolic encephalopathy 04/24/2024   Acute hyponatremia 04/24/2024   Generalized weakness 04/24/2024   CKD stage 3b, GFR 30-44 ml/min (HCC) 04/24/2024   GAD (generalized anxiety disorder) 04/24/2024   BPH (benign prostatic hyperplasia) 04/24/2024   Chronic diastolic CHF (congestive heart failure) (HCC) 04/24/2024   History of anemia due to chronic kidney  disease 04/24/2024   Hypoxia 04/23/2024   PVC's (premature ventricular contractions) 11/06/2023   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   Hyperlipidemia 02/28/2023   Stage 3a chronic kidney disease (CKD) (HCC) 12/30/2022   Hyperglycemia 12/30/2022   Lung nodule 12/30/2022   Coronary artery disease involving native coronary artery of native heart with unstable angina pectoris (HCC) 12/30/2022   AAA (abdominal aortic aneurysm) without rupture (HCC) 12/30/2022   Carotid artery disease (HCC) 12/30/2022   Non-ST elevation (NSTEMI) myocardial infarction (HCC) 12/29/2022   AKI (acute kidney injury) (HCC) 12/25/2022   Essential hypertension 12/25/2022   History of CVA (cerebrovascular accident) 12/25/2022   Angina pectoris (HCC) 12/24/2022   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 01/02/2018   Tremor 06/14/2017   Premature atrial beats 03/10/2016   PVD (peripheral vascular disease) 03/10/2016   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/17/2013    Orientation RESPIRATION BLADDER Height & Weight     Self, Time, Situation, Place  Normal Incontinent Weight: 195 lb 5.2 oz (88.6 kg) Height:  6' 0.02 (182.9 cm)  BEHAVIORAL SYMPTOMS/MOOD NEUROLOGICAL BOWEL NUTRITION STATUS      Incontinent Diet (see dc summary)  AMBULATORY STATUS COMMUNICATION OF NEEDS Skin   Limited Assist Verbally Normal                       Personal Care Assistance Level of Assistance  Dressing, Bathing Bathing Assistance: Limited assistance   Dressing Assistance: Limited assistance     Functional Limitations Info  Speech, Hearing, Sight Sight Info: Impaired Hearing Info: Adequate Speech Info: Adequate    SPECIAL CARE FACTORS FREQUENCY  PT (By licensed PT),  OT (By licensed OT)     PT Frequency: 5x/wk OT Frequency: 5x/wk            Contractures Contractures Info: Not present    Additional Factors Info  Allergies, Code Status Code Status Info: Full code Allergies Info: NKA           Current Medications  (09/09/2024):  This is the current hospital active medication list Current Facility-Administered Medications  Medication Dose Route Frequency Provider Last Rate Last Admin   acetaminophen  (TYLENOL ) tablet 650 mg  650 mg Oral Q6H PRN Celinda Alm Lot, MD       Or   acetaminophen  (TYLENOL ) suppository 650 mg  650 mg Rectal Q6H PRN Celinda Alm Lot, MD       albuterol  (PROVENTIL ) (2.5 MG/3ML) 0.083% nebulizer solution 2.5 mg  2.5 mg Nebulization Q4H PRN Celinda Alm Lot, MD       amiodarone  (PACERONE ) tablet 200 mg  200 mg Oral Daily Celinda Alm Lot, MD   200 mg at 09/09/24 9097   amLODipine  (NORVASC ) tablet 5 mg  5 mg Oral Daily Regalado, Belkys A, MD   5 mg at 09/09/24 1231   azithromycin  (ZITHROMAX ) tablet 250 mg  250 mg Oral Daily Regalado, Belkys A, MD   250 mg at 09/09/24 0902   enoxaparin  (LOVENOX ) injection 40 mg  40 mg Subcutaneous Q24H Celinda Alm Lot, MD   40 mg at 09/08/24 2101   guaiFENesin  (MUCINEX ) 12 hr tablet 600 mg  600 mg Oral BID Celinda Alm Lot, MD   600 mg at 09/09/24 0902   insulin  aspart (novoLOG ) injection 0-9 Units  0-9 Units Subcutaneous TID WC Celinda Alm Lot, MD   5 Units at 09/09/24 1405   isosorbide  mononitrate (IMDUR ) 24 hr tablet 15 mg  15 mg Oral Daily Celinda Alm Lot, MD   15 mg at 09/09/24 0901   lactulose  (CHRONULAC ) 10 GM/15ML solution 20 g  20 g Oral BID Regalado, Belkys A, MD   20 g at 09/09/24 9096   levothyroxine  (SYNTHROID ) tablet 25 mcg  25 mcg Oral QAC breakfast Celinda Alm Lot, MD   25 mcg at 09/09/24 9474   mirtazapine  (REMERON ) tablet 7.5 mg  7.5 mg Oral QHS Celinda Alm Lot, MD   7.5 mg at 09/08/24 2102   polyethylene glycol (MIRALAX  / GLYCOLAX ) packet 17 g  17 g Oral Daily PRN Shona Laurence N, DO       predniSONE  (DELTASONE ) tablet 40 mg  40 mg Oral Q breakfast Celinda Alm Lot, MD   40 mg at 09/09/24 9097   prochlorperazine  (COMPAZINE ) injection 5 mg  5 mg Intravenous Q6H PRN Shona Laurence N, DO       sertraline   (ZOLOFT ) tablet 150 mg  150 mg Oral Daily Celinda Alm Lot, MD   150 mg at 09/09/24 9097   tamsulosin  (FLOMAX ) capsule 0.4 mg  0.4 mg Oral QHS Celinda Alm Lot, MD   0.4 mg at 09/08/24 2102   triamcinolone  (KENALOG ) 0.025 % cream   Topical BID Celinda Alm Lot, MD   Given at 09/09/24 [phone]     Discharge Medications: Please see discharge summary for a list of discharge medications.  Relevant Imaging Results:  Relevant Lab Results:   Additional Information SSN [id number] [place], [place]

## 2024-09-09 NOTE — Plan of Care (Signed)
  Problem: Nutrition: Goal: Adequate nutrition will be maintained Outcome: Progressing   Problem: Coping: Goal: Level of anxiety will decrease Outcome: Progressing   Problem: Elimination: Goal: Will not experience complications related to bowel motility Outcome: Progressing   Problem: Pain Managment: Goal: General experience of comfort will improve and/or be controlled Outcome: Progressing   Problem: Safety: Goal: Ability to remain free from injury will improve Outcome: Progressing   Problem: Skin Integrity: Goal: Risk for impaired skin integrity will decrease Outcome: Progressing   Problem: Coping: Goal: Ability to adjust to condition or change in health will improve Outcome: Progressing

## 2024-09-09 NOTE — Progress Notes (Signed)
 PROGRESS NOTE    Gregory [name]  FMW:[id number] DOB: 11/01/1934 DOA: 09/06/2024 PCP: Tanda Prentice DEL, MD   Brief Narrative: 89 year old with past medical history significant for abdominal aneurysmal, angina, CAD, history of non-STEMI, depression, kidney disease, hyperlipidemia, hypertension, history of CVA, history of small subarachnoid hemorrhage after having a fall in July, bilateral subdural hematoma he has been slowly declining since those hospitalizations.  Patient has been staying in bed most of the day, having difficulty remembering how to do basic task.  He used to ambulate with a walker but has significantly declined over the last few days and is unable to get up without assistance.  Patient presented with leukocytosis, ammonia level elevated at 51, TSH elevated at 5.7 creatinine 1.3.  CT head showed old right frontal infarct, atrophy resolution of previous low-density left subdural hematoma.  Assessment & Plan:   Principal Problem:   Acute encephalopathy Active Problems:   Essential hypertension   Stage 3a chronic kidney disease (CKD) (HCC)   AAA (abdominal aortic aneurysm) without rupture (HCC)   Carotid artery disease (HCC)   Hyperlipidemia   Generalized weakness   GAD (generalized anxiety disorder)   BPH (benign prostatic hyperplasia)   Chronic diastolic CHF (congestive heart failure) (HCC)   Elevated LFTs   Pressure injury of ankle, stage 2 (HCC)   CAD (coronary artery disease)   PVD (peripheral vascular disease) (HCC)   Phimosis of penis   Balanitis   Hyperammonemia (HCC)   Constipation   Dyspnea   Reactive airways dysfunction syndrome (HCC)   Type 2 diabetes mellitus with hyperglycemia (HCC)   Hepatic steatosis  1-Acute hepatic encephalopathy Hyperammonemia Generalized weakness - Suspect in the setting of elevated ammonia level. - Started on lactulose , continue Improved.   Reactive airway dysfunction syndrome: Dyspnea - Received IV methylprednisolone .   Not on prednisone . - On a scheduled DuoNeb -Chest x ray: Increased size of the small dependent right pleural effusion. Small amount of loculated fluid in the upper right oblique fissure posteriorly is unchanged. - Started azithromycin  -Denies worsening dyspnea Stable.   Phimosis of the penis Balanitis - Continue topical steroid for balanitis  Elevated TSH; check free T3 pending and  T 4 1.1 normal.   Hypertension -on Imdur   Stage IIIa CKD - Monitor renal function  Carotid artery disease, CAD, PVD: - Currently off of antiplatelet therapy.  Continue with nitrates daily  Transaminases; - History of hepatic steatosis - Hold statin - hepatitis panel negative -Had a previous ultrasound right upper quadrant 80/04/2024 that was negative for cholelithiasis only showed hepatic asteatosis. He has had chronic elevation of liver function tests over the last 4 months. Will need to follow-up as an outpatient. INR normal  Diabetes type 2 with hyperglycemia: - A1c;  7.6 SSI Could discharge on low dose metformin  Hyperlipidemia - Holding statin due to transaminases - Continue sliding scale insulin   GAD: - Continue sertraline  and mirtazapine   BPH - Continue Flomax   Chronic diastolic heart failure - Continue Imdur  - Stop IV fluid  Injury stage II sacral - Local care  Constipation -Started on lactulose   AAA - Follow imaging as an outpatient     Estimated body mass index is 26.48 kg/m as calculated from the following:   Height as of this encounter: 6' 0.02 (1.829 m).   Weight as of this encounter: 88.6 kg.   DVT prophylaxis: Lovenox  Code Status: Full code Family Communication: Son updated at bedside.  Disposition Plan:  Status is: Observation The patient remains OBS  appropriate and will d/c before 2 midnights.    Consultants:  None  Procedures:  None  Antimicrobials:    Subjective: He is alert, no new complaints. Oriented times 3.    Objective: Vitals:   09/08/24 1240 09/08/24 1959 09/09/24 0452 09/09/24 1231  BP: 136/72 (!) 146/81 (!) 164/77 (!) 164/77  Pulse: 72 77 78   Resp: 15 18 18    Temp: 98.1 F (36.7 C) 98.2 F (36.8 C) 97.8 F (36.6 C)   TempSrc: Oral Oral Oral   SpO2: 95% 94% 92%   Weight:      Height:        Intake/Output Summary (Last 24 hours) at 09/09/2024 1303 Last data filed at 09/09/2024 0510 Gross per 24 hour  Intake 354 ml  Output 400 ml  Net -46 ml   Filed Weights   09/07/24 0053  Weight: 88.6 kg    Examination:  General exam: NAD Respiratory system: BL [place].  Cardiovascular system: S ,1 S 2  RRR Gastrointestinal system: BS present, soft, nt Central nervous system: Alert Extremities: Symmetric 5 x 5 power.    Data Reviewed: I have personally reviewed following labs and imaging studies  CBC: Recent Labs  Lab 09/06/24 1631 09/07/24 1151 09/08/24 0641 09/09/24 0825  WBC 13.0* 11.6* 9.9 10.4  NEUTROABS 9.4*  --   --   --   HGB 13.8 13.8 11.5* 12.3*  HCT 41.7 42.2 35.5* 38.7*  MCV 92.7 94.2 95.4 94.4  PLT 228 179 169 196   Basic Metabolic Panel: Recent Labs  Lab 09/06/24 1631 09/07/24 1321 09/08/24 0641  NA 136 133* 133*  K 4.2 4.5 4.1  CL 99 98 101  CO2 23 22 20*  GLUCOSE 191* 171* 125*  BUN 12 12 16   CREATININE 1.30* 1.14 1.14  CALCIUM  9.9 9.2 8.9  MG  --  1.7  --   PHOS  --  2.5  --    GFR: Estimated Creatinine Clearance: 50.1 mL/min (by C-G formula based on SCr of 1.14 mg/dL). Liver Function Tests: Recent Labs  Lab 09/06/24 1631 09/07/24 1321 09/08/24 0641  AST 111* 90* 95*  ALT 71* 60* 61*  ALKPHOS 149* 141* 98  BILITOT 1.2 1.0 1.1  PROT 8.1 7.4 6.4*  ALBUMIN  3.7 3.3* 2.9*   No results for input(s): LIPASE, AMYLASE in the last 168 hours. Recent Labs  Lab 09/06/24 2114 09/07/24 1151 09/08/24 0849 09/09/24 0825  AMMONIA 51* 68* 52* 38*   Coagulation Profile: Recent Labs  Lab 09/08/24 0849  INR 1.2   Cardiac  Enzymes: No results for input(s): CKTOTAL, CKMB, CKMBINDEX, TROPONINI in the last 168 hours. BNP (last 3 results) No results for input(s): PROBNP in the last 8760 hours. HbA1C: Recent Labs    09/07/24 1151  HGBA1C 7.6*   CBG: Recent Labs  Lab 09/08/24 0714 09/08/24 1143 09/08/24 1729 09/08/24 2058 09/09/24 0752  GLUCAP 127* 166* 219* 259* 128*   Lipid Profile: No results for input(s): CHOL, HDL, LDLCALC, TRIG, CHOLHDL, LDLDIRECT in the last 72 hours. Thyroid Function Tests: Recent Labs    09/06/24 2114 09/08/24 1009  TSH 5.750*  --   FREET4  --  1.11   Anemia Panel: No results for input(s): VITAMINB12, FOLATE, FERRITIN, TIBC, IRON, RETICCTPCT in the last 72 hours. Sepsis Labs: Recent Labs  Lab 09/07/24 1321  PROCALCITON 0.23    Recent Results (from the past 240 hours)  Resp panel by RT-PCR (RSV, Flu A&B, Covid) Anterior Nasal Swab  Status: None   Collection Time: 09/06/24  6:40 PM   Specimen: Anterior Nasal Swab  Result Value Ref Range Status   SARS Coronavirus 2 by RT PCR NEGATIVE NEGATIVE Final    Comment: (NOTE) SARS-CoV-2 target nucleic acids are NOT DETECTED.  The SARS-CoV-2 RNA is generally detectable in upper respiratory specimens during the acute phase of infection. The lowest concentration of SARS-CoV-2 viral copies this assay can detect is 138 copies/mL. A negative result does not preclude SARS-Cov-2 infection and should not be used as the sole basis for treatment or other patient management decisions. A negative result may occur with  improper specimen collection/handling, submission of specimen other than nasopharyngeal swab, presence of viral mutation(s) within the areas targeted by this assay, and inadequate number of viral copies(<138 copies/mL). A negative result must be combined with clinical observations, patient history, and epidemiological information. The expected result is Negative.  Fact Sheet for  Patients:  BloggerCourse.com  Fact Sheet for Healthcare Providers:  SeriousBroker.it  This test is no t yet approved or cleared by the [place]  FDA and  has been authorized for detection and/or diagnosis of SARS-CoV-2 by FDA under an Emergency Use Authorization (EUA). This EUA will remain  in effect (meaning this test can be used) for the duration of the COVID-19 declaration under Section 564(b)(1) of the Act, 21 U.S.C.section 360bbb-3(b)(1), unless the authorization is terminated  or revoked sooner.       Influenza A by PCR NEGATIVE NEGATIVE Final   Influenza B by PCR NEGATIVE NEGATIVE Final    Comment: (NOTE) The Xpert Xpress SARS-CoV-2/FLU/RSV plus assay is intended as an aid in the diagnosis of influenza from Nasopharyngeal swab specimens and should not be used as a sole basis for treatment. Nasal washings and aspirates are unacceptable for Xpert Xpress SARS-CoV-2/FLU/RSV testing.  Fact Sheet for Patients: BloggerCourse.com  Fact Sheet for Healthcare Providers: SeriousBroker.it  This test is not yet approved or cleared by the [place]  FDA and has been authorized for detection and/or diagnosis of SARS-CoV-2 by FDA under an Emergency Use Authorization (EUA). This EUA will remain in effect (meaning this test can be used) for the duration of the COVID-19 declaration under Section 564(b)(1) of the Act, 21 U.S.C. section 360bbb-3(b)(1), unless the authorization is terminated or revoked.     Resp Syncytial Virus by PCR NEGATIVE NEGATIVE Final    Comment: (NOTE) Fact Sheet for Patients: BloggerCourse.com  Fact Sheet for Healthcare Providers: SeriousBroker.it  This test is not yet approved or cleared by the [place]  FDA and has been authorized for detection and/or diagnosis of SARS-CoV-2 by FDA under an Emergency  Use Authorization (EUA). This EUA will remain in effect (meaning this test can be used) for the duration of the COVID-19 declaration under Section 564(b)(1) of the Act, 21 U.S.C. section 360bbb-3(b)(1), unless the authorization is terminated or revoked.  Performed at [facility], [address], [place], [place] [address]          Radiology Studies: No results found.       Scheduled Meds:  amiodarone   200 mg Oral Daily   amLODipine   5 mg Oral Daily   azithromycin   250 mg Oral Daily   enoxaparin  (LOVENOX ) injection  40 mg Subcutaneous Q24H   guaiFENesin   600 mg Oral BID   insulin  aspart  0-9 Units Subcutaneous TID WC   isosorbide  mononitrate  15 mg Oral Daily   lactulose   20 g Oral BID   levothyroxine   25 mcg Oral  QAC breakfast   mirtazapine   7.5 mg Oral QHS   predniSONE   40 mg Oral Q breakfast   sertraline   150 mg Oral Daily   tamsulosin   0.4 mg Oral QHS   triamcinolone    Topical BID   Continuous Infusions:   LOS: 0 days    Time spent: 35 minutes    Khamani Daniely A Matheus Spiker, MD [facility]   If 7PM-7AM, please contact night-coverage [email or web address]  09/09/2024, 1:03 PM

## 2024-09-09 NOTE — Evaluation (Signed)
 Occupational Therapy Evaluation Patient Details Name: Gregory [name] MRN: [id number] DOB: 04-09-1934 Today's Date: 09/09/2024   History of Present Illness   89 y.o. male with medical history significant of abdominal aneurysm anginal pain, CAD s/p CABG, NSTEMI, depression, kidney disease, hyperlipidemia, hypertension, CVA, history of small subarachnoid hemorrhage after having a fall on July 31.  He was also admitted due to bilateral subdural hematomas.  He has been slowly declining since these hospitalizations.  According to the patient's son he has been staying in bed most of the day.  He has been having difficulty remembering how to do basic tasks.  He used to ambulate with a walker and make transfers without assistance, but has had a significant decline over the last few days and is unable to get up without the assistance of 2 persons.  Pt admitted 09/06/24 for work up of acute encephalopathy and dyspnea.     Clinical Impressions PTA, patient lives at home with family and up until falls 1 month prior, was mod I with mobility and BADL's/IADL's, and most recently receiving HHOT services.  Currently, patient presents sub-baseline with deficits outlined below (see OT Problem List for details) most significantly decreased cognition, balance, activity tolerance, muscle strength and coordination limiting BADL's and functional mobility and safety performance. Patient requires continued Acute care hospital level OT services to progress safety and functional performance and allow for discharge. Patient will benefit from continued inpatient follow up therapy, <3 hours/day.        If plan is discharge home, recommend the following:   Two people to help with walking and/or transfers;A lot of help with bathing/dressing/bathroom;Assistance with cooking/housework;Direct supervision/assist for medications management;Direct supervision/assist for financial management;Assist for transportation;Help with stairs or  ramp for entrance;Supervision due to cognitive status     Functional Status Assessment   Patient has had a recent decline in their functional status and demonstrates the ability to make significant improvements in function in a reasonable and predictable amount of time.     Equipment Recommendations   Other (comment) (TBD post rehab venue)      Precautions/Restrictions   Precautions Precautions: Fall Recall of Precautions/Restrictions: Impaired Restrictions Weight Bearing Restrictions Per Provider Order: No     Mobility Bed Mobility Overal bed mobility: Needs Assistance Bed Mobility: Supine to Sit, Sit to Supine     Supine to sit: Mod assist, HOB elevated, Min assist Sit to supine: Mod assist, Contact guard assist, Used rails   General bed mobility comments: mod cues for body alignment and motor planning    Transfers Overall transfer level: Needs assistance Equipment used: Rolling walker (2 wheels) Transfers: Sit to/from Stand Sit to Stand: Mod assist           General transfer comment: mod verbal and tactile cues for foot and hand placement      Balance Overall balance assessment: History of Falls, Needs assistance Sitting-balance support: Feet supported, No upper extremity supported Sitting balance-Leahy Scale: Fair     Standing balance support: Bilateral upper extremity supported, During functional activity, Reliant on assistive device for balance Standing balance-Leahy Scale: Poor                             ADL either performed or assessed with clinical judgement   ADL Overall ADL's : Needs assistance/impaired Eating/Feeding: Set up;Sitting   Grooming: Wash/dry hands;Wash/dry face;Oral care;Brushing hair;Contact guard assist;Sitting   Upper Body Bathing: Minimal assistance   Lower Body Bathing:  Maximal assistance;Sitting/lateral leans   Upper Body Dressing : Minimal assistance;Cueing for sequencing;Cueing for safety   Lower  Body Dressing: Maximal assistance;Cueing for safety;Cueing for sequencing   Toilet Transfer: Moderate assistance;Cueing for safety;Cueing for sequencing Toilet Transfer Details (indicate cue type and reason): RW for UE support Toileting- Clothing Manipulation and Hygiene: Moderate assistance;Cueing for safety;Cueing for sequencing Toileting - Clothing Manipulation Details (indicate cue type and reason): ab;e to use urinal bed level     Functional mobility during ADLs: Moderate assistance;Cueing for safety;Cueing for sequencing General ADL Comments: responds well to mltimodal cues     Vision Baseline Vision/History: 1 Wears glasses;0 No visual deficits Ability to See in Adequate Light: 0 Adequate Patient Visual Report: No change from baseline Vision Assessment?: No apparent visual deficits;Wears glasses for reading        Praxis Praxis: Impaired Praxis Impairment Details: Motor planning     Pertinent Vitals/Pain Pain Assessment Pain Assessment: No/denies pain     Extremity/Trunk Assessment Upper Extremity Assessment Upper Extremity Assessment: Right hand dominant;Generalized weakness;RUE deficits/detail;LUE deficits/detail RUE Coordination: decreased fine motor;decreased gross motor LUE Coordination: decreased fine motor;decreased gross motor   Lower Extremity Assessment Lower Extremity Assessment: Defer to PT evaluation   Cervical / Trunk Assessment Cervical / Trunk Assessment: Kyphotic   Communication Communication Communication: Impaired Factors Affecting Communication: Hearing impaired   Cognition Arousal: Alert Behavior During Therapy: WFL for tasks assessed/performed Cognition: Cognition impaired   Orientation impairments: Situation, Time Awareness: Intellectual awareness impaired, Online awareness impaired Memory impairment (select all impairments): Short-term memory Attention impairment (select first level of impairment): Sustained attention Executive  functioning impairment (select all impairments): Organization, Sequencing, Reasoning, Problem solving OT - Cognition Comments: decreased insight, can be somewhat tangential but extremely pleasant and cooperative                 Following commands: Intact       Cueing  General Comments   Cueing Techniques: Verbal cues;Tactile cues;Gestural cues;Visual cues  no skin issues, edema or SOB noted           Home Living Family/patient expects to be discharged to:: Private residence Living Arrangements: Spouse/significant other;Children (son) Available Help at Discharge: Family;Available 24 hours/day Type of Home: House Home Access: Stairs to enter [employer] of Steps: 4 Entrance Stairs-Rails: Left Home Layout: Multi-level;Able to live on main level with bedroom/bathroom Alternate Level Stairs-Number of Steps: 3 story home. His bedroom and full bathroom are on the main level of the home.   Bathroom Shower/Tub: Psychologist, counselling;Door   Bathroom Toilet: Handicapped height     Home Equipment: Agricultural consultant (2 wheels);Shower seat;Hand held shower head          Prior Functioning/Environment Prior Level of Function : Needs assist             Mobility Comments: son reports pt was requiring another person ambulate with him for safety; uses RW, has had 3 falls in the past month; son reports pt requiring more physical assist approx a week leading up to admission ADLs Comments: son reports home services such as RN and OT have been assisting more with ADLs    OT Problem List: Decreased strength;Decreased activity tolerance;Impaired balance (sitting and/or standing);Decreased coordination;Decreased cognition   OT Treatment/Interventions: Self-care/ADL training;Therapeutic exercise;Neuromuscular education;Energy conservation;DME and/or AE instruction;Therapeutic activities;Cognitive remediation/compensation;Patient/family education;Balance training      OT  Goals(Current goals can be found in the care plan section)   Acute Rehab OT Goals Patient Stated Goal: to get back home OT Goal  Formulation: With patient/family Time For Goal Achievement: 09/23/24 Potential to Achieve Goals: Good ADL Goals Pt Will Perform Grooming: with set-up;sitting Pt Will Perform Upper Body Bathing: with set-up;sitting Pt Will Perform Lower Body Bathing: with min assist;sit to/from stand Pt Will Perform Upper Body Dressing: with set-up;sitting Pt Will Perform Lower Body Dressing: with min assist;sit to/from stand Pt Will Transfer to Toilet: with contact guard assist;regular height toilet;ambulating;grab bars;bedside commode Pt Will Perform Toileting - Clothing Manipulation and hygiene: with set-up;sitting/lateral leans   OT Frequency:  Min 2X/week       AM-PAC OT 6 Clicks Daily Activity     Outcome Measure Help from another person eating meals?: A Little Help from another person taking care of personal grooming?: A Little Help from another person toileting, which includes using toliet, bedpan, or urinal?: A Lot Help from another person bathing (including washing, rinsing, drying)?: A Lot Help from another person to put on and taking off regular upper body clothing?: A Lot Help from another person to put on and taking off regular lower body clothing?: A Lot 6 Click Score: 14   End of Session Equipment Utilized During Treatment: Gait belt;Rolling walker (2 wheels) Nurse Communication: Mobility status  Activity Tolerance: Patient tolerated treatment well Patient left: in bed;with call bell/phone within reach;with bed alarm set;with family/visitor present (patient had been up in am and became stiff/sore in recliner patien reports and son endorses)  OT Visit Diagnosis: Unsteadiness on feet (R26.81);Repeated falls (R29.6);Muscle weakness (generalized) (M62.81);History of falling (Z91.81);Ataxia, unspecified (R27.0);Cognitive communication deficit (R41.841)                 Time: 8483-8449 OT Time Calculation (min): 34 min Charges:  OT General Charges $OT Visit: 1 Visit OT Evaluation $OT Eval Low Complexity: 1 Low OT Treatments $Self Care/Home Management : 8-22 mins  Ashantee Deupree OT/L Acute Rehabilitation Department  [phone]  09/09/2024, 7:04 PM

## 2024-09-09 NOTE — TOC Initial Note (Signed)
 Transition of Care [facility]) - Initial/Assessment Note    Patient Details  Name: Gregory [name] MRN: [id number] Date of Birth: 08-25-1934  Transition of Care [facility]) CM/SW Contact:    Sheri ONEIDA Sharps, LCSW Phone Number: 09/09/2024, 4:01 PM  Clinical Narrative:                 Pt from home w/ children. Pt recommended for SNF. Pt and pt son accepting of rec. PASRR obtained and FL2 completed. SNF referral faxed. Bed choice is [facility]. CSW notified [name] at facility of bed acceptance. Ins auth started, pending approval.   Expected Discharge Plan: Skilled Nursing Facility Barriers to Discharge: Continued Medical Work up   Patient Goals and CMS Choice Patient states their goals for this hospitalization and ongoing recovery are:: return home following STR CMS Medicare.gov Compare Post Acute Care list provided to:: Patient Choice offered to / list presented to : Adult Children DISH ownership interest in [facility].provided to:: Adult Children    Expected Discharge Plan and Services In-house Referral: NA Discharge Planning Services: NA Post Acute Care Choice: Skilled Nursing Facility Living arrangements for the past 2 months: Single Family Home                 DME Arranged: N/A DME Agency: NA       HH Arranged: NA HH Agency: NA        Prior Living Arrangements/Services Living arrangements for the past 2 months: Single Family Home Lives with:: Adult Children Patient language and need for interpreter reviewed:: Yes Do you feel safe going back to the place where you live?: Yes      Need for Family Participation in Patient Care: Yes (Comment) Care giver support system in place?: Yes (comment)   Criminal Activity/Legal Involvement Pertinent to Current Situation/Hospitalization: No - Comment as needed  Activities of Daily Living      Permission Sought/Granted                  Emotional Assessment Appearance:: Appears stated  age Attitude/Demeanor/Rapport: Engaged Affect (typically observed): Accepting Orientation: : Oriented to Self, Oriented to Place, Oriented to Situation Alcohol / Substance Use: Not Applicable Psych Involvement: No (comment)  Admission diagnosis:  Acute encephalopathy [G93.40] Patient Active Problem List   Diagnosis Date Noted   Pressure injury of ankle, stage 2 (HCC) 09/07/2024   CAD (coronary artery disease) 09/07/2024   Phimosis of penis 09/07/2024   Balanitis 09/07/2024   Hyperammonemia 09/07/2024   Constipation 09/07/2024   Dyspnea 09/07/2024   Reactive airways dysfunction syndrome (HCC) 09/07/2024   Type 2 diabetes mellitus with hyperglycemia (HCC) 09/07/2024   Hepatic steatosis 09/07/2024   Acute encephalopathy 09/06/2024   Subdural hematoma without coma, without loss of consciousness, initial encounter (HCC) 07/23/2024   Abnormal urinalysis 07/23/2024   Elevated LFTs 07/23/2024   Malignant neoplasm of right upper lobe of lung (HCC) 06/24/2024   Esophageal dysphagia 05/27/2024   Abnormal CT of the chest 05/27/2024   COVID-19 virus infection 04/24/2024   Acute hypoxic respiratory failure (HCC) 04/24/2024   Acute metabolic encephalopathy 04/24/2024   Acute hyponatremia 04/24/2024   Generalized weakness 04/24/2024   CKD stage 3b, GFR 30-44 ml/min (HCC) 04/24/2024   GAD (generalized anxiety disorder) 04/24/2024   BPH (benign prostatic hyperplasia) 04/24/2024   Chronic diastolic CHF (congestive heart failure) (HCC) 04/24/2024   History of anemia due to chronic kidney disease 04/24/2024   Hypoxia 04/23/2024   PVC's (premature ventricular contractions) 11/06/2023  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   Hyperlipidemia 02/28/2023   Stage 3a chronic kidney disease (CKD) (HCC) 12/30/2022   Hyperglycemia 12/30/2022   Lung nodule 12/30/2022   Coronary artery disease involving native coronary artery of native heart with unstable angina pectoris (HCC) 12/30/2022   AAA (abdominal aortic  aneurysm) without rupture 12/30/2022   Carotid artery disease 12/30/2022   Non-ST elevation (NSTEMI) myocardial infarction (HCC) 12/29/2022   AKI (acute kidney injury) 12/25/2022   Essential hypertension 12/25/2022   History of CVA (cerebrovascular accident) 12/25/2022   Angina pectoris (HCC) 12/24/2022   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 [place] [address] Phone: [phone] Fax: [phone]     Social Drivers of Health (SDOH) Social History: SDOH Screenings   Food Insecurity: No Food Insecurity (09/08/2024)  Housing: Low Risk  (09/08/2024)  Transportation Needs: No Transportation Needs (09/08/2024)  Utilities: Not At Risk (09/08/2024)  Social Connections: Moderately Integrated (09/08/2024)  Tobacco Use: Medium Risk (09/06/2024)   SDOH Interventions:     Readmission Risk Interventions    04/25/2024    1:49 PM 05/28/2023    9:14 AM 05/23/2023    2:30 PM  Readmission Risk Prevention Plan  Transportation Screening Complete  Complete  PCP or Specialist Appt within 5-7 Days Complete    Home Care Screening Complete Complete   Medication Review (RN CM) Complete Complete   HRI or Home Care Consult   Complete  Social Work Consult for Recovery Care Planning/Counseling   Complete  Palliative Care Screening   Not Applicable  Medication Review Oceanographer)   Referral to Pharmacy

## 2024-09-10 DIAGNOSIS — G9340 Encephalopathy, unspecified: Secondary | ICD-10-CM | POA: Diagnosis not present

## 2024-09-10 LAB — GLUCOSE, CAPILLARY
Glucose-Capillary: 115 mg/dL — ABNORMAL HIGH (ref 70–99)
Glucose-Capillary: 241 mg/dL — ABNORMAL HIGH (ref 70–99)
Glucose-Capillary: 222 mg/dL — ABNORMAL HIGH (ref 70–99)
Glucose-Capillary: 270 mg/dL — ABNORMAL HIGH (ref 70–99)

## 2024-09-10 NOTE — Progress Notes (Signed)
 PROGRESS NOTE    Gregory [name]  FMW:[id number] DOB: 30-Jan-1934 DOA: 09/06/2024 PCP: Tanda Prentice DEL, MD   Brief Narrative: 89 year old with past medical history significant for abdominal aneurysmal, angina, CAD, history of non-STEMI, depression, kidney disease, hyperlipidemia, hypertension, history of CVA, history of small subarachnoid hemorrhage after having a fall in July, bilateral subdural hematoma he has been slowly declining since those hospitalizations.  Patient has been staying in bed most of the day, having difficulty remembering how to do basic task.  He used to ambulate with a walker but has significantly declined over the last few days and is unable to get up without assistance.  Patient presented with leukocytosis, ammonia level elevated at 51, TSH elevated at 5.7 creatinine 1.3.  CT head showed old right frontal infarct, atrophy resolution of previous low-density left subdural hematoma.  Patient encephalopathy has improved, currently awaiting rehab.  Assessment & Plan:   Principal Problem:   Acute encephalopathy Active Problems:   Essential hypertension   Stage 3a chronic kidney disease (CKD) (HCC)   AAA (abdominal aortic aneurysm) without rupture   Carotid artery disease   Hyperlipidemia   Generalized weakness   GAD (generalized anxiety disorder)   BPH (benign prostatic hyperplasia)   Chronic diastolic CHF (congestive heart failure) (HCC)   Elevated LFTs   Pressure injury of ankle, stage 2 (HCC)   CAD (coronary artery disease)   PVD (peripheral vascular disease)   Phimosis of penis   Balanitis   Hyperammonemia   Constipation   Dyspnea   Reactive airways dysfunction syndrome (HCC)   Type 2 diabetes mellitus with hyperglycemia (HCC)   Hepatic steatosis  1-Acute hepatic encephalopathy Hyperammonemia Generalized weakness - Suspect in the setting of elevated ammonia level. - Started on lactulose , continue Improved.   Reactive airway dysfunction  syndrome: Dyspnea - Received IV methylprednisolone .  -Continue prednisone  for 5 days - On a scheduled DuoNeb -Chest x ray: Increased size of the small dependent right pleural effusion. Small amount of loculated fluid in the upper right oblique fissure posteriorly is unchanged. - Will complete 4 days of azithromycin . Stable.   Phimosis of the penis Balanitis - Continue topical steroid for balanitis  Elevated TSH;  free T3 1.6  and  T 4:  1.1 normal.  His confusion has improved, will repeat TSH in 4 weeks.  Hypertension -on Imdur   Stage IIIa CKD - Monitor renal function  Carotid artery disease, CAD, PVD: - Currently off of antiplatelet therapy.  Continue with nitrates daily  Transaminases; - History of hepatic steatosis - Hold statin - hepatitis panel negative -Had a previous ultrasound right upper quadrant 80/04/2024 that was negative for cholelithiasis only showed hepatic asteatosis. He has had chronic elevation of liver function tests over the last 4 months. Will need to follow-up as an outpatient. INR normal  Diabetes type 2 with hyperglycemia: - A1c;  7.6 SSI Could discharge on low dose metformin  Hyperlipidemia - Holding statin due to transaminases - Continue sliding scale insulin   GAD: - Continue sertraline  and mirtazapine   BPH - Continue Flomax   Chronic diastolic heart failure - Continue Imdur  - Stop IV fluid  Injury stage II sacral - Local care  Constipation -Started on lactulose   AAA - Follow imaging as an outpatient     Estimated body mass index is 26.48 kg/m as calculated from the following:   Height as of this encounter: 6' 0.02 (1.829 m).   Weight as of this encounter: 88.6 kg.   DVT prophylaxis: Lovenox  Code Status:  Full code Family Communication: Son updated at bedside.  Disposition Plan:  Status is: Observation The patient remains OBS appropriate and will d/c before 2 midnights.    Consultants:  None  Procedures:   None  Antimicrobials:    Subjective: Alert no new complaint, oriented to time place and person.  Objective: Vitals:   09/09/24 1439 09/09/24 2012 09/10/24 0441 09/10/24 0928  BP: (!) 151/75 (!) 146/76 (!) 159/81 (!) 140/79  Pulse: 77 76 67   Resp: 19 18 18    Temp: 98.2 F (36.8 C) 97.8 F (36.6 C) (!) 97.5 F (36.4 C)   TempSrc: Oral Oral Oral   SpO2: 95% 94% 92%   Weight:      Height:        Intake/Output Summary (Last 24 hours) at 09/10/2024 1507 Last data filed at 09/10/2024 0900 Gross per 24 hour  Intake 360 ml  Output 500 ml  Net -140 ml   Filed Weights   09/07/24 0053  Weight: 88.6 kg    Examination:  General exam: NAD Respiratory system: BL [place].  Cardiovascular system:  S1, S 2 RRR Gastrointestinal system: BS present, soft, nt Central nervous system: Alert Extremities: Sno edema    Data Reviewed: I have personally reviewed following labs and imaging studies  CBC: Recent Labs  Lab 09/06/24 1631 09/07/24 1151 09/08/24 0641 09/09/24 0825  WBC 13.0* 11.6* 9.9 10.4  NEUTROABS 9.4*  --   --   --   HGB 13.8 13.8 11.5* 12.3*  HCT 41.7 42.2 35.5* 38.7*  MCV 92.7 94.2 95.4 94.4  PLT 228 179 169 196   Basic Metabolic Panel: Recent Labs  Lab 09/06/24 1631 09/07/24 1321 09/08/24 0641 09/09/24 0825  NA 136 133* 133* 139  K 4.2 4.5 4.1 4.1  CL 99 98 101 102  CO2 23 22 20* 21*  GLUCOSE 191* 171* 125* 127*  BUN 12 12 16 19   CREATININE 1.30* 1.14 1.14 1.13  CALCIUM  9.9 9.2 8.9 9.3  MG  --  1.7  --   --   PHOS  --  2.5  --   --    GFR: Estimated Creatinine Clearance: 50.6 mL/min (by C-G formula based on SCr of 1.13 mg/dL). Liver Function Tests: Recent Labs  Lab 09/06/24 1631 09/07/24 1321 09/08/24 0641  AST 111* 90* 95*  ALT 71* 60* 61*  ALKPHOS 149* 141* 98  BILITOT 1.2 1.0 1.1  PROT 8.1 7.4 6.4*  ALBUMIN  3.7 3.3* 2.9*   No results for input(s): LIPASE, AMYLASE in the last 168 hours. Recent Labs  Lab 09/06/24 2114  09/07/24 1151 09/08/24 0849 09/09/24 0825  AMMONIA 51* 68* 52* 38*   Coagulation Profile: Recent Labs  Lab 09/08/24 0849  INR 1.2   Cardiac Enzymes: No results for input(s): CKTOTAL, CKMB, CKMBINDEX, TROPONINI in the last 168 hours. BNP (last 3 results) No results for input(s): PROBNP in the last 8760 hours. HbA1C: No results for input(s): HGBA1C in the last 72 hours.  CBG: Recent Labs  Lab 09/09/24 1357 09/09/24 1801 09/09/24 2117 09/10/24 0736 09/10/24 1256  GLUCAP 261* 221* 204* 115* 241*   Lipid Profile: No results for input(s): CHOL, HDL, LDLCALC, TRIG, CHOLHDL, LDLDIRECT in the last 72 hours. Thyroid Function Tests: Recent Labs    09/08/24 1009  FREET4 1.11  T3FREE 1.6*   Anemia Panel: No results for input(s): VITAMINB12, FOLATE, FERRITIN, TIBC, IRON, RETICCTPCT in the last 72 hours. Sepsis Labs: Recent Labs  Lab 09/07/24 1321  PROCALCITON 0.23  Recent Results (from the past 240 hours)  Resp panel by RT-PCR (RSV, Flu A&B, Covid) Anterior Nasal Swab     Status: None   Collection Time: 09/06/24  6:40 PM   Specimen: Anterior Nasal Swab  Result Value Ref Range Status   SARS Coronavirus 2 by RT PCR NEGATIVE NEGATIVE Final    Comment: (NOTE) SARS-CoV-2 target nucleic acids are NOT DETECTED.  The SARS-CoV-2 RNA is generally detectable in upper respiratory specimens during the acute phase of infection. The lowest concentration of SARS-CoV-2 viral copies this assay can detect is 138 copies/mL. A negative result does not preclude SARS-Cov-2 infection and should not be used as the sole basis for treatment or other patient management decisions. A negative result may occur with  improper specimen collection/handling, submission of specimen other than nasopharyngeal swab, presence of viral mutation(s) within the areas targeted by this assay, and inadequate number of viral copies(<138 copies/mL). A negative result must be  combined with clinical observations, patient history, and epidemiological information. The expected result is Negative.  Fact Sheet for Patients:  BloggerCourse.com  Fact Sheet for Healthcare Providers:  SeriousBroker.it  This test is no t yet approved or cleared by the [place]  FDA and  has been authorized for detection and/or diagnosis of SARS-CoV-2 by FDA under an Emergency Use Authorization (EUA). This EUA will remain  in effect (meaning this test can be used) for the duration of the COVID-19 declaration under Section 564(b)(1) of the Act, 21 U.S.C.section 360bbb-3(b)(1), unless the authorization is terminated  or revoked sooner.       Influenza A by PCR NEGATIVE NEGATIVE Final   Influenza B by PCR NEGATIVE NEGATIVE Final    Comment: (NOTE) The Xpert Xpress SARS-CoV-2/FLU/RSV plus assay is intended as an aid in the diagnosis of influenza from Nasopharyngeal swab specimens and should not be used as a sole basis for treatment. Nasal washings and aspirates are unacceptable for Xpert Xpress SARS-CoV-2/FLU/RSV testing.  Fact Sheet for Patients: BloggerCourse.com  Fact Sheet for Healthcare Providers: SeriousBroker.it  This test is not yet approved or cleared by the [place]  FDA and has been authorized for detection and/or diagnosis of SARS-CoV-2 by FDA under an Emergency Use Authorization (EUA). This EUA will remain in effect (meaning this test can be used) for the duration of the COVID-19 declaration under Section 564(b)(1) of the Act, 21 U.S.C. section 360bbb-3(b)(1), unless the authorization is terminated or revoked.     Resp Syncytial Virus by PCR NEGATIVE NEGATIVE Final    Comment: (NOTE) Fact Sheet for Patients: BloggerCourse.com  Fact Sheet for Healthcare Providers: SeriousBroker.it  This test is not yet  approved or cleared by the [place]  FDA and has been authorized for detection and/or diagnosis of SARS-CoV-2 by FDA under an Emergency Use Authorization (EUA). This EUA will remain in effect (meaning this test can be used) for the duration of the COVID-19 declaration under Section 564(b)(1) of the Act, 21 U.S.C. section 360bbb-3(b)(1), unless the authorization is terminated or revoked.  Performed at [facility], [address] [place], [place] [address]          Radiology Studies: No results found.       Scheduled Meds:  amiodarone   200 mg Oral Daily   amLODipine   5 mg Oral Daily   azithromycin   250 mg Oral Daily   enoxaparin  (LOVENOX ) injection  40 mg Subcutaneous Q24H   guaiFENesin   600 mg Oral BID   insulin  aspart  0-9 Units Subcutaneous TID WC  isosorbide  mononitrate  15 mg Oral Daily   lactulose   20 g Oral BID   levothyroxine   25 mcg Oral QAC breakfast   mirtazapine   7.5 mg Oral QHS   predniSONE   40 mg Oral Q breakfast   sertraline   150 mg Oral Daily   tamsulosin   0.4 mg Oral QHS   triamcinolone    Topical BID   Continuous Infusions:   LOS: 1 day    Time spent: 35 minutes    Indira Sorenson A Danayah Smyre, MD [facility]   If 7PM-7AM, please contact night-coverage [email or web address]  09/10/2024, 3:07 PM

## 2024-09-10 NOTE — Plan of Care (Signed)
   Problem: Education: Goal: Knowledge of General Education information will improve Description: Including pain rating scale, medication(s)/side effects and non-pharmacologic comfort measures Outcome: Progressing   Problem: Clinical Measurements: Goal: Ability to maintain clinical measurements within normal limits will improve Outcome: Progressing

## 2024-09-10 NOTE — Progress Notes (Signed)
 Physical Therapy Treatment Patient Details Name: Gregory [name] MRN: [id number] DOB: 02/18/1934 Today's Date: 09/10/2024   History of Present Illness 89 y.o. male with medical history significant of abdominal aneurysm anginal pain, CAD s/p CABG, NSTEMI, depression, kidney disease, hyperlipidemia, hypertension, CVA, history of small subarachnoid hemorrhage after having a fall on July 31.  He was also admitted due to bilateral subdural hematomas.  He has been slowly declining since these hospitalizations.  According to the patient's son he has been staying in bed most of the day.  He has been having difficulty remembering how to do basic tasks.  He used to ambulate with a walker and make transfers without assistance, but has had a significant decline over the last few days and is unable to get up without the assistance of 2 persons.  Pt admitted 09/06/24 for work up of acute encephalopathy and dyspnea.    PT Comments  Pt is progressing toward acute PT goals this session with progression of ambulation distance and decreased assist required during bed mobility and sit to stand transfers (improved during session). Pt ambulated ~19ft with MIN physical assist for steadying and up to MOD A and multimodal cuing for RW management, especially with turns to maximize safety and reduce risk of fall. Pt will benefit from continued skilled PT to increase their independence and maximize safety with mobility.      If plan is discharge home, recommend the following: A lot of help with walking and/or transfers;A lot of help with bathing/dressing/bathroom;Assistance with cooking/housework;Assist for transportation;Help with stairs or ramp for entrance;Supervision due to cognitive status   Can travel by private vehicle     Yes  Equipment Recommendations  None recommended by PT (pt owns RW)    Recommendations for Other Services       Precautions / Restrictions Precautions Precautions: Fall Recall of  Precautions/Restrictions: Impaired Restrictions Weight Bearing Restrictions Per Provider Order: No     Mobility  Bed Mobility Overal bed mobility: Needs Assistance Bed Mobility: Supine to Sit, Sit to Supine     Supine to sit: Min assist     General bed mobility comments: Unsuccessful attempts when allowed to trial without assist. Ultimately required MIN HHA for trunk to upright, increased time. Able to scoot to edge of bed with increased time. HOB slightly elevated, no use of rails- pt reports he has been sleeping on couch on main level at home. UE tremors noted.    Transfers Overall transfer level: Needs assistance Equipment used: Rolling walker (2 wheels) Transfers: Sit to/from Stand Sit to Stand: Mod assist           General transfer comment: increased time, verbal and tactile cues for problem solving with hand placement to promote independence with power up to stand. Pt does best with single UE on RW and use of bed for power up with increased time to rise. Progressed to MIN during session with use of B UE for power up on recliner armrest    Ambulation/Gait Ambulation/Gait assistance: Min assist Gait Distance (Feet): 85 Feet Assistive device: Rolling walker (2 wheels) Gait Pattern/deviations: Step-through pattern, Decreased stride length, Trunk flexed Gait velocity: decr     General Gait Details: verbal cues for RW positioning and posture; tends to have increased trunk flexion and keep RW too far forward despite cues requiring manual asisst to correct; MOD A for RW management with turns- pt with RW too far in front and stepping outside of RW when attempting to turn hips.  Stairs             Wheelchair Mobility     Tilt Bed    Modified Rankin (Stroke Patients Only)       Balance Overall balance assessment: History of Falls, Needs assistance Sitting-balance support: Feet supported, No upper extremity supported Sitting balance-Leahy Scale: Fair      Standing balance support: Bilateral upper extremity supported, During functional activity, Reliant on assistive device for balance Standing balance-Leahy Scale: Poor                              Communication Communication Communication: Impaired Factors Affecting Communication: Hearing impaired  Cognition Arousal: Alert Behavior During Therapy: WFL for tasks assessed/performed                             Following commands: Intact      Cueing Cueing Techniques: Verbal cues, Tactile cues, Gestural cues, Visual cues  Exercises Other Exercises Other Exercises: functional sit to stand x8 (use of B UEs on armrest for power up and stability) Other Exercises: seated LE bicycles x20s Other Exercises: LAQ x10 each w/ 5sec hold    General Comments        Pertinent Vitals/Pain Pain Assessment Pain Assessment: No/denies pain    Home Living                          Prior Function            PT Goals (current goals can now be found in the care plan section) Acute Rehab PT Goals Patient Stated Goal: Get stronger PT Goal Formulation: With patient/family Time For Goal Achievement: 09/22/24 Potential to Achieve Goals: Good Progress towards PT goals: Progressing toward goals    Frequency    Min 2X/week      PT Plan      Co-evaluation              AM-PAC PT 6 Clicks Mobility   Outcome Measure  Help needed turning from your back to your side while in a flat bed without using bedrails?: A Little Help needed moving from lying on your back to sitting on the side of a flat bed without using bedrails?: A Lot Help needed moving to and from a bed to a chair (including a wheelchair)?: A Lot Help needed standing up from a chair using your arms (e.g., wheelchair or bedside chair)?: A Lot Help needed to walk in hospital room?: A Little Help needed climbing 3-5 steps with a railing? : A Lot 6 Click Score: 14    End of Session Equipment  Utilized During Treatment: Gait belt Activity Tolerance: Patient tolerated treatment well Patient left: in chair;with call bell/phone within reach;with chair alarm set Nurse Communication: Mobility status PT Visit Diagnosis: Difficulty in walking, not elsewhere classified (R26.2);Muscle weakness (generalized) (M62.81)     Time: 8994-8962 PT Time Calculation (min) (ACUTE ONLY): 32 min  Charges:    $Therapeutic Exercise: 8-22 mins $Therapeutic Activity: 8-22 mins PT General Charges $$ ACUTE PT VISIT: 1 Visit                     Tinnie BERRY PT, DPT  Acute Rehabilitation Services  Office [phone]   09/10/2024, 10:57 AM

## 2024-09-11 DIAGNOSIS — G9340 Encephalopathy, unspecified: Secondary | ICD-10-CM | POA: Diagnosis not present

## 2024-09-11 LAB — GLUCOSE, CAPILLARY
Glucose-Capillary: 116 mg/dL — ABNORMAL HIGH (ref 70–99)
Glucose-Capillary: 280 mg/dL — ABNORMAL HIGH (ref 70–99)

## 2024-09-11 MED ORDER — LACTULOSE 10 GM/15ML PO SOLN: 20.0000 g | Freq: Two times a day (BID) | ORAL | Status: AC

## 2024-09-11 MED ORDER — TRIAMCINOLONE ACETONIDE 0.025 % EX CREA: TOPICAL_CREAM | Freq: Two times a day (BID) | CUTANEOUS | Status: AC

## 2024-09-11 MED ORDER — ALBUTEROL SULFATE (2.5 MG/3ML) 0.083% IN NEBU: 2.5000 mg | INHALATION_SOLUTION | RESPIRATORY_TRACT | Status: AC | PRN

## 2024-09-11 MED ORDER — ALPRAZOLAM 0.5 MG PO TABS: 1.0000 mg | ORAL_TABLET | Freq: Every day | ORAL | 0 refills | Status: AC

## 2024-09-11 NOTE — Progress Notes (Signed)
 Occupational Therapy Treatment Patient Details Name: Gregory [name] MRN: [id number] DOB: November 07, 1934 Today's Date: 09/11/2024   History of present illness 89 y.o. male with medical history significant of abdominal aneurysm anginal pain, CAD s/p CABG, NSTEMI, depression, kidney disease, hyperlipidemia, hypertension, CVA, history of small subarachnoid hemorrhage after having a fall on July 31.  He was also admitted due to bilateral subdural hematomas.  He has been slowly declining since these hospitalizations.  According to the patient's son he has been staying in bed most of the day.  He has been having difficulty remembering how to do basic tasks.  He used to ambulate with a walker and make transfers without assistance, but has had a significant decline over the last few days and is unable to get up without the assistance of 2 persons.  Pt admitted 09/06/24 for work up of acute encephalopathy and dyspnea.   OT comments  Patient seen or skilled OT session to progress BADL's and mobility for toileting and LB bathing bathroom level. RW access for amb with cues and support due to balance and motor planning deficits. Seated UE theract for breathing integration, scanning and cognition. Son present for session for carryover.  Patient requires continued Acute care hospital level OT services to progress safety and functional performance and allow for discharge. Patient will benefit from continued inpatient follow up therapy, <3 hours/day.         If plan is discharge home, recommend the following:  Two people to help with walking and/or transfers;A lot of help with bathing/dressing/bathroom;Assistance with cooking/housework;Direct supervision/assist for medications management;Direct supervision/assist for financial management;Assist for transportation;Help with stairs or ramp for entrance;Supervision due to cognitive status   Equipment Recommendations  Other (comment)       Precautions / Restrictions  Precautions Precautions: Fall Recall of Precautions/Restrictions: Impaired Restrictions Weight Bearing Restrictions Per Provider Order: No       Mobility Bed Mobility               General bed mobility comments: was up in bathroom and remained up in recliner    Transfers Overall transfer level: Needs assistance Equipment used: Rolling walker (2 wheels) Transfers: Sit to/from Stand, Bed to chair/wheelchair/BSC Sit to Stand: Min assist, Mod assist     Step pivot transfers: Min assist, Mod assist     General transfer comment: cues for safe RW use and hand placement     Balance Overall balance assessment: History of Falls, Needs assistance Sitting-balance support: Feet supported, No upper extremity supported Sitting balance-Leahy Scale: Fair     Standing balance support: Bilateral upper extremity supported, During functional activity, Reliant on assistive device for balance Standing balance-Leahy Scale: Poor Standing balance comment: needs cues to remain within RW frame                           ADL either performed or assessed with clinical judgement   ADL Overall ADL's : Needs assistance/impaired     Grooming: Wash/dry hands;Wash/dry face;Supervision/safety;Sitting       Lower Body Bathing: Moderate assistance;Maximal assistance;Sit to/from stand;Cueing for sequencing;Cueing for safety Lower Body Bathing Details (indicate cue type and reason): commode in bathroom level         Toilet Transfer: Minimal assistance;Moderate assistance;Rolling walker (2 wheels);Regular Toilet;Grab bars Toilet Transfer Details (indicate cue type and reason): amb to and from bathroom or access Toileting- Clothing Manipulation and Hygiene: Moderate assistance;Cueing for safety;Cueing for sequencing Toileting - Clothing Manipulation Details (indicate  cue type and reason): applied barrier cream post BM hygiene     Functional mobility during ADLs: Moderate  assistance;Cueing for safety;Cueing for sequencing;Rolling walker (2 wheels) General ADL Comments: min-mod cues for sequencing, limited standing levl motor planning with RW    Extremity/Trunk Assessment Upper Extremity Assessment Upper Extremity Assessment: Right hand dominant;Generalized weakness RUE Coordination: decreased fine motor;decreased gross motor LUE Coordination: decreased fine motor;decreased gross motor   Lower Extremity Assessment Lower Extremity Assessment: Defer to PT evaluation        Vision   Vision Assessment?: No apparent visual deficits;Wears glasses for reading      Praxis Praxis Praxis: Impaired Praxis Impairment Details: Motor planning   Communication Communication Communication: Impaired Factors Affecting Communication: Hearing impaired   Cognition Arousal: Alert Behavior During Therapy: WFL for tasks assessed/performed Cognition: Cognition impaired           Executive functioning impairment (select all impairments): Organization, Sequencing, Problem solving                   Following commands: Intact        Cueing   Cueing Techniques: Verbal cues, Tactile cues, Gestural cues, Visual cues  Exercises Exercises: Other exercises (target baloon toss for motor planning and progression of activity tolerance seated with rest breaks between 25 reps x 4 sets)       General Comments mild redness on buttocks, barrier cream applied, rests needed between activity    Pertinent Vitals/ Pain       Pain Assessment Pain Assessment: No/denies pain   Frequency  Min 2X/week        Progress Toward Goals  OT Goals(current goals can now be found in the care plan section)  Progress towards OT goals: Progressing toward goals  Acute Rehab OT Goals Patient Stated Goal: to get stronger OT Goal Formulation: With patient/family Time For Goal Achievement: 09/23/24 Potential to Achieve Goals: Good ADL Goals Pt Will Perform Grooming: with  set-up;sitting Pt Will Perform Upper Body Bathing: with set-up;sitting Pt Will Perform Lower Body Bathing: with min assist;sit to/from stand Pt Will Perform Upper Body Dressing: with set-up;sitting Pt Will Perform Lower Body Dressing: with min assist;sit to/from stand Pt Will Transfer to Toilet: with contact guard assist;regular height toilet;ambulating;grab bars;bedside commode Pt Will Perform Toileting - Clothing Manipulation and hygiene: with set-up;sitting/lateral leans  Plan         AM-PAC OT 6 Clicks Daily Activity     Outcome Measure   Help from another person eating meals?: A Little Help from another person taking care of personal grooming?: A Little Help from another person toileting, which includes using toliet, bedpan, or urinal?: A Lot Help from another person bathing (including washing, rinsing, drying)?: A Lot Help from another person to put on and taking off regular upper body clothing?: A Lot Help from another person to put on and taking off regular lower body clothing?: A Lot 6 Click Score: 14    End of Session Equipment Utilized During Treatment: Gait belt;Rolling walker (2 wheels)  OT Visit Diagnosis: Unsteadiness on feet (R26.81);Repeated falls (R29.6);Muscle weakness (generalized) (M62.81);History of falling (Z91.81);Ataxia, unspecified (R27.0);Cognitive communication deficit (R41.841)   Activity Tolerance Patient tolerated treatment well   Patient Left in chair;with chair alarm set;with family/visitor present   Nurse Communication Mobility status;Other (comment) (BM logged in Flowsheets)        Time: 1355-1436 OT Time Calculation (min): 41 min  Charges: OT General Charges $OT Visit: 1 Visit OT Treatments $Self Care/Home Management :  8-22 mins $Therapeutic Activity: 23-37 mins  Cristela Stalder OT/L Acute Rehabilitation Department  [phone]  09/11/2024, 3:01 PM

## 2024-09-11 NOTE — TOC Transition Note (Signed)
 Transition of Care [facility]) - Discharge Note   Patient Details  Name: Gregory [name] MRN: [id number] Date of Birth: 1934-08-04  Transition of Care [facility]) CM/SW Contact:  Sheri ONEIDA Sharps, LCSW Phone Number: 09/11/2024, 1:59 PM   Clinical Narrative:    Pt medically ready to dc to [facility]. Call report [phone] room 306 given to nurse. DC packet left at nurses station. PTAR called at 2pm. No further ICM needs.    Final next level of care: Skilled Nursing Facility Barriers to Discharge: Barriers Resolved   Patient Goals and CMS Choice Patient states their goals for this hospitalization and ongoing recovery are:: return home following STR CMS Medicare.gov Compare Post Acute Care list provided to::  (NA) Choice offered to / list presented to : Adult Children [place] ownership interest in [facility].provided to:: Spouse    Discharge Placement PASRR number recieved: 09/09/24            Patient chooses bed at: [facility] Patient to be transferred to facility by: PTAR Name of family member notified: [place], [place] (Son)  [phone] Patient and family notified of of transfer: 09/11/24  Discharge Plan and Services Additional resources added to the After Visit Summary for   In-house Referral: NA Discharge Planning Services: NA Post Acute Care Choice: Skilled Nursing Facility          DME Arranged: N/A DME Agency: NA       HH Arranged: NA HH Agency: NA        Social Drivers of Health (SDOH) Interventions SDOH Screenings   Food Insecurity: No Food Insecurity (09/08/2024)  Housing: Low Risk  (09/08/2024)  Transportation Needs: No Transportation Needs (09/08/2024)  Utilities: Not At Risk (09/08/2024)  Social Connections: Moderately Integrated (09/08/2024)  Tobacco Use: Medium Risk (09/06/2024)     Readmission Risk Interventions    09/11/2024    9:27 AM 04/25/2024    1:49 PM 05/28/2023    9:14 AM  Readmission Risk Prevention Plan  Transportation  Screening Complete Complete   PCP or Specialist Appt within 5-7 Days  Complete   Home Care Screening  Complete Complete  Medication Review (RN CM)  Complete Complete  Medication Review (RN Care Manager) Complete    PCP or Specialist appointment within 3-5 days of discharge Complete    HRI or Home Care Consult Complete    SW Recovery Care/Counseling Consult Complete    Palliative Care Screening Not Applicable    Skilled Nursing Facility Complete

## 2024-09-11 NOTE — Plan of Care (Signed)
 Problem: Education: Goal: Knowledge of General Education information will improve Description: Including pain rating scale, medication(s)/side effects and non-pharmacologic comfort measures 09/11/2024 1514 by Morna Aquas, RN Outcome: Adequate for Discharge 09/11/2024 1427 by Morna Aquas, RN Outcome: Adequate for Discharge   Problem: Health Behavior/Discharge Planning: Goal: Ability to manage health-related needs will improve 09/11/2024 1514 by Morna Aquas, RN Outcome: Adequate for Discharge 09/11/2024 1427 by Morna Aquas, RN Outcome: Adequate for Discharge   Problem: Clinical Measurements: Goal: Ability to maintain clinical measurements within normal limits will improve 09/11/2024 1514 by Morna Aquas, RN Outcome: Adequate for Discharge 09/11/2024 1427 by Morna Aquas, RN Outcome: Adequate for Discharge Goal: Will remain free from infection 09/11/2024 1514 by Morna Aquas, RN Outcome: Adequate for Discharge 09/11/2024 1427 by Morna Aquas, RN Outcome: Adequate for Discharge 09/11/2024 1406 by Morna Aquas, RN Outcome: Progressing Goal: Diagnostic test results will improve 09/11/2024 1514 by Morna Aquas, RN Outcome: Adequate for Discharge 09/11/2024 1427 by Morna Aquas, RN Outcome: Adequate for Discharge Goal: Respiratory complications will improve 09/11/2024 1514 by Morna Aquas, RN Outcome: Adequate for Discharge 09/11/2024 1427 by Morna Aquas, RN Outcome: Adequate for Discharge Goal: Cardiovascular complication will be avoided 09/11/2024 1514 by Morna Aquas, RN Outcome: Adequate for Discharge 09/11/2024 1427 by Morna Aquas, RN Outcome: Adequate for Discharge   Problem: Activity: Goal: Risk for activity intolerance will decrease 09/11/2024 1514 by Morna Aquas, RN Outcome: Adequate for Discharge 09/11/2024 1427 by Morna Aquas, RN Outcome: Adequate for Discharge   Problem: Nutrition: Goal: Adequate nutrition will be  maintained 09/11/2024 1514 by Morna Aquas, RN Outcome: Adequate for Discharge 09/11/2024 1427 by Morna Aquas, RN Outcome: Adequate for Discharge   Problem: Coping: Goal: Level of anxiety will decrease 09/11/2024 1514 by Morna Aquas, RN Outcome: Adequate for Discharge 09/11/2024 1427 by Morna Aquas, RN Outcome: Adequate for Discharge   Problem: Elimination: Goal: Will not experience complications related to bowel motility 09/11/2024 1514 by Morna Aquas, RN Outcome: Adequate for Discharge 09/11/2024 1427 by Morna Aquas, RN Outcome: Adequate for Discharge Goal: Will not experience complications related to urinary retention 09/11/2024 1514 by Morna Aquas, RN Outcome: Adequate for Discharge 09/11/2024 1427 by Morna Aquas, RN Outcome: Adequate for Discharge   Problem: Pain Managment: Goal: General experience of comfort will improve and/or be controlled 09/11/2024 1514 by Morna Aquas, RN Outcome: Adequate for Discharge 09/11/2024 1427 by Morna Aquas, RN Outcome: Adequate for Discharge   Problem: Safety: Goal: Ability to remain free from injury will improve 09/11/2024 1514 by Morna Aquas, RN Outcome: Adequate for Discharge 09/11/2024 1427 by Morna Aquas, RN Outcome: Adequate for Discharge 09/11/2024 1406 by Morna Aquas, RN Outcome: Progressing   Problem: Skin Integrity: Goal: Risk for impaired skin integrity will decrease 09/11/2024 1514 by Morna Aquas, RN Outcome: Adequate for Discharge 09/11/2024 1427 by Morna Aquas, RN Outcome: Adequate for Discharge 09/11/2024 1406 by Morna Aquas, RN Outcome: Progressing   Problem: Education: Goal: Ability to describe self-care measures that may prevent or decrease complications (Diabetes Survival Skills Education) will improve 09/11/2024 1514 by Morna Aquas, RN Outcome: Adequate for Discharge 09/11/2024 1427 by Morna Aquas, RN Outcome: Adequate for Discharge Goal:  Individualized Educational Video(s) 09/11/2024 1514 by Morna Aquas, RN Outcome: Adequate for Discharge 09/11/2024 1427 by Morna Aquas, RN Outcome: Adequate for Discharge   Problem: Coping: Goal: Ability to adjust to condition or change in health will improve 09/11/2024 1514 by Morna Aquas, RN Outcome: Adequate for Discharge 09/11/2024 1427 by Morna Aquas, RN Outcome: Adequate for Discharge 09/11/2024 1406 by Morna,  Karna, RN Outcome: Progressing   Problem: Fluid Volume: Goal: Ability to maintain a balanced intake and output will improve 09/11/2024 1514 by Morna Karna, RN Outcome: Adequate for Discharge 09/11/2024 1427 by Morna Karna, RN Outcome: Adequate for Discharge   Problem: Health Behavior/Discharge Planning: Goal: Ability to identify and utilize available resources and services will improve 09/11/2024 1514 by Morna Karna, RN Outcome: Adequate for Discharge 09/11/2024 1427 by Morna Karna, RN Outcome: Adequate for Discharge Goal: Ability to manage health-related needs will improve 09/11/2024 1514 by Morna Karna, RN Outcome: Adequate for Discharge 09/11/2024 1427 by Morna Karna, RN Outcome: Adequate for Discharge   Problem: Metabolic: Goal: Ability to maintain appropriate glucose levels will improve 09/11/2024 1514 by Morna Karna, RN Outcome: Adequate for Discharge 09/11/2024 1427 by Morna Karna, RN Outcome: Adequate for Discharge   Problem: Nutritional: Goal: Maintenance of adequate nutrition will improve 09/11/2024 1514 by Morna Karna, RN Outcome: Adequate for Discharge 09/11/2024 1427 by Morna Karna, RN Outcome: Adequate for Discharge Goal: Progress toward achieving an optimal weight will improve 09/11/2024 1514 by Morna Karna, RN Outcome: Adequate for Discharge 09/11/2024 1427 by Morna Karna, RN Outcome: Adequate for Discharge   Problem: Skin Integrity: Goal: Risk for impaired skin integrity will  decrease 09/11/2024 1514 by Morna Karna, RN Outcome: Adequate for Discharge 09/11/2024 1427 by Morna Karna, RN Outcome: Adequate for Discharge   Problem: Tissue Perfusion: Goal: Adequacy of tissue perfusion will improve 09/11/2024 1514 by Morna Karna, RN Outcome: Adequate for Discharge 09/11/2024 1427 by Morna Karna, RN Outcome: Adequate for Discharge   Problem: Education: Goal: Knowledge of disease or condition will improve 09/11/2024 1514 by Morna Karna, RN Outcome: Adequate for Discharge 09/11/2024 1427 by Morna Karna, RN Outcome: Adequate for Discharge 09/11/2024 1406 by Morna Karna, RN Outcome: Progressing Goal: Knowledge of the prescribed therapeutic regimen will improve 09/11/2024 1514 by Morna Karna, RN Outcome: Adequate for Discharge 09/11/2024 1427 by Morna Karna, RN Outcome: Adequate for Discharge 09/11/2024 1406 by Morna Karna, RN Outcome: Progressing Goal: Individualized Educational Video(s) 09/11/2024 1514 by Morna Karna, RN Outcome: Adequate for Discharge 09/11/2024 1427 by Morna Karna, RN Outcome: Adequate for Discharge   Problem: Activity: Goal: Ability to tolerate increased activity will improve 09/11/2024 1514 by Morna Karna, RN Outcome: Adequate for Discharge 09/11/2024 1427 by Morna Karna, RN Outcome: Adequate for Discharge Goal: Will verbalize the importance of balancing activity with adequate rest periods 09/11/2024 1514 by Morna Karna, RN Outcome: Adequate for Discharge 09/11/2024 1427 by Morna Karna, RN Outcome: Adequate for Discharge   Problem: Respiratory: Goal: Ability to maintain a clear airway will improve 09/11/2024 1514 by Morna Karna, RN Outcome: Adequate for Discharge 09/11/2024 1427 by Morna Karna, RN Outcome: Adequate for Discharge Goal: Levels of oxygenation will improve 09/11/2024 1514 by Morna Karna, RN Outcome: Adequate for Discharge 09/11/2024 1427 by Morna Karna, RN Outcome: Adequate for Discharge Goal: Ability to maintain adequate ventilation will improve 09/11/2024 1514 by Morna Karna, RN Outcome: Adequate for Discharge 09/11/2024 1427 by Morna Karna, RN Outcome: Adequate for Discharge

## 2024-09-11 NOTE — Plan of Care (Signed)
 Problem: Education: Goal: Knowledge of General Education information will improve Description: Including pain rating scale, medication(s)/side effects and non-pharmacologic comfort measures Outcome: Adequate for Discharge   Problem: Health Behavior/Discharge Planning: Goal: Ability to manage health-related needs will improve Outcome: Adequate for Discharge   Problem: Clinical Measurements: Goal: Ability to maintain clinical measurements within normal limits will improve Outcome: Adequate for Discharge Goal: Will remain free from infection 09/11/2024 1427 by Morna Aquas, RN Outcome: Adequate for Discharge 09/11/2024 1406 by Morna Aquas, RN Outcome: Progressing Goal: Diagnostic test results will improve Outcome: Adequate for Discharge Goal: Respiratory complications will improve Outcome: Adequate for Discharge Goal: Cardiovascular complication will be avoided Outcome: Adequate for Discharge   Problem: Activity: Goal: Risk for activity intolerance will decrease Outcome: Adequate for Discharge   Problem: Nutrition: Goal: Adequate nutrition will be maintained Outcome: Adequate for Discharge   Problem: Coping: Goal: Level of anxiety will decrease Outcome: Adequate for Discharge   Problem: Elimination: Goal: Will not experience complications related to bowel motility Outcome: Adequate for Discharge Goal: Will not experience complications related to urinary retention Outcome: Adequate for Discharge   Problem: Pain Managment: Goal: General experience of comfort will improve and/or be controlled Outcome: Adequate for Discharge   Problem: Safety: Goal: Ability to remain free from injury will improve 09/11/2024 1427 by Morna Aquas, RN Outcome: Adequate for Discharge 09/11/2024 1406 by Morna Aquas, RN Outcome: Progressing   Problem: Skin Integrity: Goal: Risk for impaired skin integrity will decrease 09/11/2024 1427 by Morna Aquas, RN Outcome: Adequate for  Discharge 09/11/2024 1406 by Morna Aquas, RN Outcome: Progressing   Problem: Education: Goal: Ability to describe self-care measures that may prevent or decrease complications (Diabetes Survival Skills Education) will improve Outcome: Adequate for Discharge Goal: Individualized Educational Video(s) Outcome: Adequate for Discharge   Problem: Coping: Goal: Ability to adjust to condition or change in health will improve 09/11/2024 1427 by Morna Aquas, RN Outcome: Adequate for Discharge 09/11/2024 1406 by Morna Aquas, RN Outcome: Progressing   Problem: Fluid Volume: Goal: Ability to maintain a balanced intake and output will improve Outcome: Adequate for Discharge   Problem: Health Behavior/Discharge Planning: Goal: Ability to identify and utilize available resources and services will improve Outcome: Adequate for Discharge Goal: Ability to manage health-related needs will improve Outcome: Adequate for Discharge   Problem: Metabolic: Goal: Ability to maintain appropriate glucose levels will improve Outcome: Adequate for Discharge   Problem: Nutritional: Goal: Maintenance of adequate nutrition will improve Outcome: Adequate for Discharge Goal: Progress toward achieving an optimal weight will improve Outcome: Adequate for Discharge   Problem: Skin Integrity: Goal: Risk for impaired skin integrity will decrease Outcome: Adequate for Discharge   Problem: Tissue Perfusion: Goal: Adequacy of tissue perfusion will improve Outcome: Adequate for Discharge   Problem: Education: Goal: Knowledge of disease or condition will improve 09/11/2024 1427 by Morna Aquas, RN Outcome: Adequate for Discharge 09/11/2024 1406 by Morna Aquas, RN Outcome: Progressing Goal: Knowledge of the prescribed therapeutic regimen will improve 09/11/2024 1427 by Morna Aquas, RN Outcome: Adequate for Discharge 09/11/2024 1406 by Morna Aquas, RN Outcome: Progressing Goal:  Individualized Educational Video(s) Outcome: Adequate for Discharge   Problem: Activity: Goal: Ability to tolerate increased activity will improve Outcome: Adequate for Discharge Goal: Will verbalize the importance of balancing activity with adequate rest periods Outcome: Adequate for Discharge   Problem: Respiratory: Goal: Ability to maintain a clear airway will improve Outcome: Adequate for Discharge Goal: Levels of oxygenation will improve Outcome: Adequate for Discharge Goal: Ability to maintain  adequate ventilation will improve Outcome: Adequate for Discharge

## 2024-09-11 NOTE — Hospital Course (Signed)
 89yo with h/o CAD, depression, HTN, HLD, stage 3a CKD, and SDH in 06/2024 who presented on 9/19 with AMS.  He was noted to have hyperammonemia and started on lactulose  with improvement.  PT/OT consulted and recommend SNF.  He is medically stable for discharge to SNF today.

## 2024-09-11 NOTE — TOC Progression Note (Signed)
 Transition of Care [facility]) - Progression Note    Patient Details  Name: Gregory [name] MRN: [id number] Date of Birth: 12-16-1934  Transition of [facility]) CM/SW Contact  Sheri ONEIDA [place], [place] Phone Number: 09/11/2024, 10:30 AM  Clinical Narrative:    Peer to peer for SNF offered for this pt. Need to be completed by 4pm. Dr Elspeth Sar [phone]. Information provided to MD.   Expected Discharge Plan: Skilled Nursing Facility Barriers to Discharge: Continued Medical Work up               Expected Discharge Plan and Services In-house Referral: NA Discharge Planning Services: NA Post Acute Care Choice: Skilled Nursing Facility Living arrangements for the past 2 months: Single Family Home                 DME Arranged: N/A DME Agency: NA       HH Arranged: NA HH Agency: NA         Social Drivers of Health (SDOH) Interventions SDOH Screenings   Food Insecurity: No Food Insecurity (09/08/2024)  Housing: Low Risk  (09/08/2024)  Transportation Needs: No Transportation Needs (09/08/2024)  Utilities: Not At Risk (09/08/2024)  Social Connections: Moderately Integrated (09/08/2024)  Tobacco Use: Medium Risk (09/06/2024)    Readmission Risk Interventions    09/11/2024    9:27 AM 04/25/2024    1:49 PM 05/28/2023    9:14 AM  Readmission Risk Prevention Plan  Transportation Screening Complete Complete   PCP or Specialist Appt within 5-7 Days  Complete   Home Care Screening  Complete Complete  Medication Review (RN CM)  Complete Complete  Medication Review (RN Care Manager) Complete    PCP or Specialist appointment within 3-5 days of discharge Complete    HRI or Home Care Consult Complete    SW Recovery Care/Counseling Consult Complete    Palliative Care Screening Not Applicable    Skilled Nursing Facility Complete

## 2024-09-11 NOTE — Discharge Summary (Signed)
 Physician Discharge Summary   Patient: Gregory [name] MRN: [id number] DOB: [birth year]  Admit date:     09/06/2024  Discharge date: 09/11/24  Discharge Physician: Delon Herald   PCP: Tanda Prentice DEL, MD   Recommendations at discharge:   You are being discharged to short-term rehabilitation Follow up with Dr. Tanda after discharge from rehab Take lactulose  as prescribed and do not miss doses Follow up with GI regarding liver dysfunction  Discharge Diagnoses: Principal Problem:   Acute encephalopathy Active Problems:   Essential hypertension   Stage 3a chronic kidney disease (CKD) (HCC)   AAA (abdominal aortic aneurysm) without rupture   Carotid artery disease   Hyperlipidemia   Generalized weakness   GAD (generalized anxiety disorder)   BPH (benign prostatic hyperplasia)   Chronic diastolic CHF (congestive heart failure) (HCC)   Elevated LFTs   Pressure injury of ankle, stage 2 (HCC)   CAD (coronary artery disease)   PVD (peripheral vascular disease)   Phimosis of penis   Balanitis   Hyperammonemia   Constipation   Dyspnea   Reactive airways dysfunction syndrome (HCC)   Type 2 diabetes mellitus with hyperglycemia (HCC)   Hepatic steatosis   Hospital Course: 89yo with h/o CAD, depression, HTN, HLD, stage 3a CKD, and SDH in 06/2024 who presented on 9/19 with AMS.  He was noted to have hyperammonemia and started on lactulose  with improvement.  PT/OT consulted and recommend SNF.  He is medically stable for discharge to SNF today.  Assessment and Plan:  Acute hepatic encephalopathy Presented with generalized weakness Thought to be related to elevated ammonia level Started on lactulose  with improvement Now medically stable for dc to SNF  Ho SDH Traumatic after fall in 06/2024 Progressive debility since despite home health PT/OT Appears likely to benefit from STR   Reactive airway dysfunction syndrome Treated with IV methylprednisolone  -> prednisone  Scheduled  DuoNebs Chest x ray with increased size of the small dependent right pleural effusion. Small amount of loculated fluid in the upper right oblique fissure posteriorly is unchanged. Completed 3 days of azithromycin /steroids  Phimosis of the penis/Balanitis Continue topical steroid    Hypothyroidism Mildly elevated TSH, low free T3, normal free T4 Likely sick euthyroid  His confusion has improved Continue Synthroid  at current dose Repeat TSH in 4-6 weeks  Chronic afib Continue amiodarone  for rate control No AC due to prior fall with SDH   Hypertension Continue amlodipine    Stage IIIa CKD Appears to be stable at this time Attempt to avoid nephrotoxic medications   Carotid artery disease, CAD, PVD Currently off of antiplatelet therapy Continue Imdur    Transaminitis History of hepatic steatosis Hold statin Hepatitis panel negative Had a previous ultrasound right upper quadrant 07/23/2024 that was negative for cholelithiasis only showed hepatic steatosis He has had chronic elevation of liver function tests over the last 4 months Will need to follow-up as an outpatient for developing cirrhosis INR normal   Diabetes type 2 with hyperglycemia A1c 7.6, at goal of <8 based on age Continue diet control for now with carb modified diet    Hyperlipidemia Holding statin due to transaminases  GAD Continue sertraline , alprazolam , and mirtazapine    BPH Continue Flomax    Chronic diastolic heart failure Continue Imdur  Appears compensated   Injury stage II sacral Local care   Constipation Started on lactulose    AAA Follow imaging as an outpatient     Consultants: Urology PT OT [facility] team  Procedures: None  Antibiotics: Azithromycin  9/21-26  Pain control - [place]  Controlled Substance Reporting System database was reviewed. and patient was instructed, not to drive, operate heavy machinery, perform activities at heights, swimming or participation in  water  activities or provide baby-sitting services while on Pain, Sleep and Anxiety Medications; until their outpatient Physician has advised to do so again. Also recommended to not to take more than prescribed Pain, Sleep and Anxiety Medications.   Disposition: Skilled nursing facility Diet recommendation:  Carb modified diet DISCHARGE MEDICATION: Allergies as of 09/11/2024   No Known Allergies      Medication List     PAUSE taking these medications    atorvastatin  80 MG tablet Wait to take this until your doctor or other care provider tells you to start again. Commonly known as: LIPITOR  Take 1 tablet (80 mg total) by mouth daily.   clopidogrel  75 MG tablet Wait to take this until your doctor or other care provider tells you to start again. Commonly known as: PLAVIX  Take 75 mg by mouth daily.       STOP taking these medications    aspirin  EC 81 MG tablet       TAKE these medications    albuterol  (2.5 MG/3ML) 0.083% nebulizer solution Commonly known as: PROVENTIL  Take 3 mLs (2.5 mg total) by nebulization every 4 (four) hours as needed for wheezing or shortness of breath.   ALPRAZolam  0.5 MG tablet Commonly known as: XANAX  Take 2 tablets (1 mg total) by mouth at bedtime.   amiodarone  200 MG tablet Commonly known as: PACERONE  Take 1 tablet (200 mg total) by mouth daily. What changed: when to take this   amLODipine  5 MG tablet Commonly known as: NORVASC  Take 5 mg by mouth daily.   CENTRUM ADULTS PO Take 1 tablet by mouth daily.   ferrous sulfate  325 (65 FE) MG EC tablet Take 1 tablet (325 mg total) by mouth daily with breakfast.   FISH OIL PO Take 1 capsule by mouth daily.   isosorbide  mononitrate 30 MG 24 hr tablet Commonly known as: IMDUR  TAKE 1/2 OF A TABLET (15 MG TOTAL) BY MOUTH DAILY What changed: See the new instructions.   lactulose  10 GM/15ML solution Commonly known as: CHRONULAC  Take 30 mLs (20 g total) by mouth 2 (two) times daily.    levothyroxine  25 MCG tablet Commonly known as: SYNTHROID  Take 25 mcg by mouth daily before breakfast.   mirtazapine  7.5 MG tablet Commonly known as: REMERON  TAKE 1 TABLET(7.5 MG) BY MOUTH AT BEDTIME   nitroGLYCERIN  0.4 MG SL tablet Commonly known as: NITROSTAT  Place 0.4 mg under the tongue as needed for chest pain.   omeprazole  20 MG capsule Commonly known as: PRILOSEC Take 1 capsule (20 mg total) by mouth every other day.   senna-docusate 8.6-50 MG tablet Commonly known as: Senokot-S Take 1 tablet by mouth at bedtime as needed for mild constipation.   sertraline  50 MG tablet Commonly known as: ZOLOFT  TAKE 3 TABLETS(150 MG) BY MOUTH DAILY   tamsulosin  0.4 MG Caps capsule Commonly known as: FLOMAX  Take 0.4 mg by mouth at bedtime.   triamcinolone  0.025 % cream Commonly known as: KENALOG  Apply topically 2 (two) times daily.   ZINC PO Take 1 capsule by mouth daily at 12 noon.        Contact information for after-discharge care     Destination     [facility] .   Service: Skilled Nursing Contact information: [address] [place]  [phone]  Discharge Exam:   Subjective: Feeling ok.  Alert and oriented x 3.  No complaints other than weakness, struggling to feed himself.   Objective: Vitals:   09/11/24 0627 09/11/24 1020  BP: (!) 180/91 138/70  Pulse: 81 71  Resp: 20   Temp: 98.5 F (36.9 C) 98 F (36.7 C)  SpO2: 93% 93%    Intake/Output Summary (Last 24 hours) at 09/11/2024 1342 Last data filed at 09/11/2024 1300 Gross per 24 hour  Intake 540 ml  Output 1850 ml  Net -1310 ml   Filed Weights   09/07/24 0053 09/11/24 0627  Weight: 88.6 kg 82.7 kg    Exam:  General:  Appears calm and comfortable and is in NAD Eyes:  normal lids, iris ENT:  grossly normal hearing, lips & tongue, mmm Cardiovascular:  RRR. No LE edema.  Respiratory:   CTA bilaterally with no wheezes/rales/rhonchi.   Normal respiratory effort. Abdomen:  soft, NT, ND Skin:  no rash or induration seen on limited exam Musculoskeletal:  grossly normal tone BUE/BLE, good ROM, no bony abnormality Psychiatric:  blunted mood and affect, speech fluent and appropriate, AOx3 Neurologic:  CN 2-12 grossly intact, moves all extremities in coordinated fashion  Data Reviewed: I have reviewed the patient's lab results since admission.  Pertinent labs for today include:   None today    Condition at discharge: improving  The results of significant diagnostics from this hospitalization (including imaging, microbiology, ancillary and laboratory) are listed below for reference.   Imaging Studies: CT Head Wo Contrast Result Date: 09/06/2024 CLINICAL DATA:  Mental status change, unknown cause.  Confusion. EXAM: CT HEAD WITHOUT CONTRAST TECHNIQUE: Contiguous axial images were obtained from the base of the skull through the vertex without intravenous contrast. RADIATION DOSE REDUCTION: This exam was performed according to the departmental dose-optimization program which includes automated exposure control, adjustment of the mA and/or kV according to patient size and/or use of iterative reconstruction technique. COMPARISON:  08/09/2024 FINDINGS: Brain: The previously seen low-density left cerebral convexity subdural hematoma has resolved. Old right frontal infarct, stable. No acute intracranial abnormality. Specifically, no hemorrhage, hydrocephalus, mass lesion, acute infarction, or significant intracranial injury. There is atrophy and chronic small vessel disease changes. Vascular: No hyperdense vessel or unexpected calcification. Skull: No acute calvarial abnormality. Sinuses/Orbits: No acute findings Other: None IMPRESSION: Atrophy, chronic microvascular disease. No acute intracranial abnormality. Old right frontal infarct. Resolution of the previously seen low-density left subdural hematoma. Electronically Signed   By: Franky Crease M.D.   On: 09/06/2024 19:08   DG Chest 2 View Result Date: 09/06/2024 CLINICAL DATA:  SHOB EXAM: CHEST - 2 VIEW COMPARISON:  08/08/2024 FINDINGS: Focal airspace consolidation in the right lung apex, unchanged, consistent with region of prior scarring. No new airspace consolidation or pneumothorax. Increased size of the small right pleural effusion. The small amount of loculated fluid in the right oblique fissure is unchanged. No cardiomegaly. Sternotomy wires. Aortic atherosclerosis. No acute fracture or destructive lesions. Multilevel thoracic osteophytosis. IMPRESSION: Increased size of the small dependent right pleural effusion. Small amount of loculated fluid in the upper right oblique fissure posteriorly is unchanged. Electronically Signed   By: Rogelia Myers M.D.   On: 09/06/2024 16:52    Microbiology: Results for orders placed or performed during the hospital encounter of 09/06/24  Resp panel by RT-PCR (RSV, Flu A&B, Covid) Anterior Nasal Swab     Status: None   Collection Time: 09/06/24  6:40 PM   Specimen: Anterior Nasal Swab  Result Value Ref Range Status   SARS Coronavirus 2 by RT PCR NEGATIVE NEGATIVE Final    Comment: (NOTE) SARS-CoV-2 target nucleic acids are NOT DETECTED.  The SARS-CoV-2 RNA is generally detectable in upper respiratory specimens during the acute phase of infection. The lowest concentration of SARS-CoV-2 viral copies this assay can detect is 138 copies/mL. A negative result does not preclude SARS-Cov-2 infection and should not be used as the sole basis for treatment or other patient management decisions. A negative result may occur with  improper specimen collection/handling, submission of specimen other than nasopharyngeal swab, presence of viral mutation(s) within the areas targeted by this assay, and inadequate number of viral copies(<138 copies/mL). A negative result must be combined with clinical observations, patient history, and  epidemiological information. The expected result is Negative.  Fact Sheet for Patients:  BloggerCourse.com  Fact Sheet for Healthcare Providers:  SeriousBroker.it  This test is no t yet approved or cleared by the [place]  FDA and  has been authorized for detection and/or diagnosis of SARS-CoV-2 by FDA under an Emergency Use Authorization (EUA). This EUA will remain  in effect (meaning this test can be used) for the duration of the COVID-19 declaration under Section 564(b)(1) of the Act, 21 U.S.C.section 360bbb-3(b)(1), unless the authorization is terminated  or revoked sooner.       Influenza A by PCR NEGATIVE NEGATIVE Final   Influenza B by PCR NEGATIVE NEGATIVE Final    Comment: (NOTE) The Xpert Xpress SARS-CoV-2/FLU/RSV plus assay is intended as an aid in the diagnosis of influenza from Nasopharyngeal swab specimens and should not be used as a sole basis for treatment. Nasal washings and aspirates are unacceptable for Xpert Xpress SARS-CoV-2/FLU/RSV testing.  Fact Sheet for Patients: BloggerCourse.com  Fact Sheet for Healthcare Providers: SeriousBroker.it  This test is not yet approved or cleared by the [place]  FDA and has been authorized for detection and/or diagnosis of SARS-CoV-2 by FDA under an Emergency Use Authorization (EUA). This EUA will remain in effect (meaning this test can be used) for the duration of the COVID-19 declaration under Section 564(b)(1) of the Act, 21 U.S.C. section 360bbb-3(b)(1), unless the authorization is terminated or revoked.     Resp Syncytial Virus by PCR NEGATIVE NEGATIVE Final    Comment: (NOTE) Fact Sheet for Patients: BloggerCourse.com  Fact Sheet for Healthcare Providers: SeriousBroker.it  This test is not yet approved or cleared by the [place]  FDA and has been  authorized for detection and/or diagnosis of SARS-CoV-2 by FDA under an Emergency Use Authorization (EUA). This EUA will remain in effect (meaning this test can be used) for the duration of the COVID-19 declaration under Section 564(b)(1) of the Act, 21 U.S.C. section 360bbb-3(b)(1), unless the authorization is terminated or revoked.  Performed at [facility], [address] [place], [place] [address]     Labs: CBC: Recent Labs  Lab 09/06/24 1631 09/07/24 1151 09/08/24 0641 09/09/24 0825  WBC 13.0* 11.6* 9.9 10.4  NEUTROABS 9.4*  --   --   --   HGB 13.8 13.8 11.5* 12.3*  HCT 41.7 42.2 35.5* 38.7*  MCV 92.7 94.2 95.4 94.4  PLT 228 179 169 196   Basic Metabolic Panel: Recent Labs  Lab 09/06/24 1631 09/07/24 1321 09/08/24 0641 09/09/24 0825  NA 136 133* 133* 139  K 4.2 4.5 4.1 4.1  CL 99 98 101 102  CO2 23 22 20* 21*  GLUCOSE 191* 171* 125* 127*  BUN 12 12 16  19  CREATININE 1.30* 1.14 1.14 1.13  CALCIUM  9.9 9.2 8.9 9.3  MG  --  1.7  --   --   PHOS  --  2.5  --   --    Liver Function Tests: Recent Labs  Lab 09/06/24 1631 09/07/24 1321 09/08/24 0641  AST 111* 90* 95*  ALT 71* 60* 61*  ALKPHOS 149* 141* 98  BILITOT 1.2 1.0 1.1  PROT 8.1 7.4 6.4*  ALBUMIN  3.7 3.3* 2.9*   CBG: Recent Labs  Lab 09/10/24 1256 09/10/24 1723 09/10/24 2138 09/11/24 0801 09/11/24 1225  GLUCAP 241* 222* 270* 116* 280*    Discharge time spent: greater than 30 minutes.  Signed: Delon Herald, MD [facility] 09/11/2024

## 2024-09-11 NOTE — Progress Notes (Signed)
 The patient has met all goals per MD orders and is ready for discharge. Patient and son in agreement, being discharged to [facility] under care of physician. Discharge summary paperwork in packet for facility. Patient left with all belonging in stable conditions via PTAR. Report called to Mercy Croak RN.

## 2024-09-11 NOTE — Progress Notes (Signed)
 This Clinical research associate tried to call report no answer will try back later.

## 2024-09-11 NOTE — Plan of Care (Signed)
  Problem: Clinical Measurements: Goal: Will remain free from infection Outcome: Progressing   Problem: Safety: Goal: Ability to remain free from injury will improve Outcome: Progressing   Problem: Skin Integrity: Goal: Risk for impaired skin integrity will decrease Outcome: Progressing   Problem: Coping: Goal: Ability to adjust to condition or change in health will improve Outcome: Progressing   Problem: Education: Goal: Knowledge of disease or condition will improve Outcome: Progressing Goal: Knowledge of the prescribed therapeutic regimen will improve Outcome: Progressing

## 2024-09-22 DIAGNOSIS — I350 Nonrheumatic aortic (valve) stenosis: Secondary | ICD-10-CM | POA: Insufficient documentation

## 2024-09-22 NOTE — Progress Notes (Unsigned)
 Cardiology Office Note:   Date:  09/25/2024  ID:  Gregory [name], DOB Mar 08, 1934, MRN [id number] PCP: Tanda Prentice DEL, MD  [facility] Providers Cardiologist:  Lynwood Schilling, MD Cardiology APP:  Madie Jon Garre, PA {  History of Present Illness:   Gregory [name] is a 89 y.o. 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     He was admitted in January 2024 with chest pain concerning for angina.  At that time heart catheterization on 12/26/2022 showed nonobstructive disease and diffuse calcified vessels.  CTA negative for PE but did show a AAA.  He was readmitted 12/29/2022 for similar presentation and repeat heart catheterization in the setting of NSTEMI revealed 75-80% lesion of the septal perforator just before an aneurysmal segment in the proximal LAD.  This was not ideal for PCI and continued medical therapy was recommended.  He was placed on 5 mg amlodipine  and low-dose Imdur .   He subsequently had a diagnosis of lung cancer.  He was being evaluated for lobectomy but had unstable angina .  He eventually had CABG with LIMA to the LAD and SVG to OM2.  He had radiation therapy.  In May 2025 he had COVID and was admitted for this.  I reviewed these records for this visit.  He only required a couple of days of hospitalization.  He had some low sodium.  Interestingly he had elevated liver enzymes that did trend down a little bit more thought to be viral.  His creatinine was mildly elevated.  Since I last saw him he was in the hospital again with acute encephalopathy.  He had an elevated ammonia level.  He was continued on amiodarone .  Statin was held.  He was to be followed to make sure he has no evidence of cirrhosis.    He went to rehab for a week afterwards.  He is getting around with a walker.  His mental status has  improved although it waxes and wanes a little bit.  He does have an obvious short-term memory problem.  He is very pleasant and denies any ongoing cardiovascular symptoms.  He is not having any chest pressure, neck or arm discomfort.  He is not having any palpitations, presyncope or syncope.  He said no weight gain or edema.    ROS: As stated in the HPI and negative for all other systems.  Studies Reviewed:    EKG:     NA  Risk Assessment/Calculations:    CHA2DS2-VASc Score = 4   This indicates a 4.8% annual risk of stroke. The patient's score is based upon: CHF History: 0 HTN History: 1 Diabetes History: 0 Stroke History: 0 Vascular Disease History: 1 Age Score: 2 Gender Score: 0       Physical Exam:   VS:  BP 130/70   Pulse 80   Ht 6' (1.829 m)   Wt 185 lb (83.9 kg)   BMI 25.09 kg/m    Wt Readings from Last 3 Encounters:  09/25/24 185 lb (83.9 kg)  09/11/24 182 lb 5.1 oz (82.7 kg)  07/23/24 188 lb 0.8 oz (85.3 kg)     GEN: Well nourished, well developed in no acute distress NECK: No JVD; No carotid bruits CARDIAC: RRR, 2/6 apical systolic murmur, no diastolic murmurs, rubs, gallops RESPIRATORY:  Clear to auscultation without rales, wheezing  or rhonchi  ABDOMEN: Soft, non-tender, non-distended EXTREMITIES:  No edema; No deformity   ASSESSMENT AND PLAN:   Acute encephalopathy: I did an extensive review of his recent hospitalization.  He has elevated ammonia and is going to be followed in the next couple of days at his primary provider office.  Is on lactulose .  Does have elevated liver enzymes but it was thought that he could continue his amiodarone .  Statin was discontinued.  This will be addressed as below.    Elevated AST/ALT:    This was evaluated as above.  He is going to have repeat liver enzymes.  If they are continuing to rise I think we will need to discontinue his amiodarone .   CKD stage IIIb:   Creat was 1.14.  No change in therapy.    Primary  hypertension: The blood pressure is at target.  No change in therapy.    Hyperlipidemia:   He will remain off statin.  In the future if he has recovered from his multiple recent acute illnesses I might consider PCSK9.    Chronic diastolic heart failure: He seems to be euvolemic.  No change in therapy.  Anemia of chronic kidney disease: He has some anemia of chronic disease but no active bleeding.  His last hemoglobin was mildly elevated at 11.5.  No change in therapy.   CAD: Status post bypass.  He has no anginal symptoms.  Plavix  was discontinued and aspirin  because of subdural hematomas.  I think in the spirit of do no harm it is most prudent to keep these off his med list for now.  Squamous cell cancer of the RUL:   I did look at oncology records and he is due to have follow-up testing and with oncology and I reviewed this with his son and the dates.   AS: He had some mild aortic stenosis.  I will manage this clinically.       Follow up with me in 6 months.  Signed, Lynwood Schilling, MD

## 2024-09-23 NOTE — Telephone Encounter (Signed)
 [facility] called and is requesting diagnosis code for nebulizer. She can be reached at [phone]

## 2024-09-23 NOTE — Telephone Encounter (Signed)
 Left vm giving verbal approval  Order for nebulizer sent to [facility] in [place]

## 2024-09-23 NOTE — Telephone Encounter (Signed)
 Copied from CRM #[id number]. Topic: Referral - New Referral/Order Request >> Sep 23, 2024  3:46 PM Ollie G wrote: POWELL CELLA is calling other request    Include all details related to the request(s) below:  POWELL, with CELLA, called for resumption of care after hospitalization.  Recertification date: 09/21/24  *He also needs order for nebulizer sent to DME company.   He was discharged with nebulizer medications but has no way to administer them.   Confirm and type the Best Contact Number below:  Patient/caller contact number:          [phone] POWELL CELLA   [] Home  [x] Mobile  [] Work [] Other   [x] Okay to leave a voicemail   Medication List:  Current Outpatient Medications:  .  ALPRAZolam  (XANAX ) 0.5 mg tablet, TAKE 1 TABLET DURING THE DAY AND 2 TABLETS EVERY NIGHT AT BEDTIME AS NEEDED FOR ANXIETY AND INSOMNIA, Disp: 90 tablet, Rfl: 1 .  amiodarone  (PACERONE ) 200 mg tablet, Take 200 mg by mouth daily., Disp: , Rfl:  .  amLODIPine  (NORVASC ) 5 mg tablet, TAKE 1 TABLET BY MOUTH DAILY FOR BLOOD PRESSURE CONTROL, Disp: 90 tablet, Rfl: 3 .  aspirin  81 mg EC tablet, Take 81 mg by mouth Once Daily., Disp: , Rfl:  .  atorvastatin  (LIPITOR ) 80 mg tablet, Take 1 tablet (80 mg total) by mouth daily., Disp: 90 tablet, Rfl: 3 .  clopidogreL  (PLAVIX ) 75 mg tablet, TAKE 1 TABLET BY MOUTH EVERY DAY, Disp: 90 tablet, Rfl: 0 .  isosorbide  mononitrate (IMDUR ) 30 mg 24 hr tablet, Take 15 mg by mouth., Disp: , Rfl:  .  levothyroxine  (SYNTHROID ) 25 mcg tablet, TAKE 1 TABLET (25 MCG TOTAL) BY MOUTH IN THE MORNING, Disp: 30 tablet, Rfl: 0 .  mirtazapine  (REMERON ) 7.5 mg tablet, Taking nightly.  Prescribed by psychiatrist., Disp: 30 tablet, Rfl: 0 .  omega 3-dha-epa-fish oil (OMEGA 3) 1,000 mg capsule, Take 1 g by mouth Once Daily., Disp: , Rfl:  .  omeprazole  (PriLOSEC) 20 mg DR capsule, Take 20 mg by mouth every other day., Disp: , Rfl:  .  sertraline  (ZOLOFT ) 50 mg tablet, 3 a day  prescribed by psychiatrist., Disp: , Rfl:  .  tamsulosin  (FLOMAX ) 0.4 mg cap, Take 1 capsule daily, at bedtime, to improve bladder function, Disp: 90 capsule, Rfl: 3     Medication Request/Refills: Pharmacy Information (if applicable)   [] Not Applicable       []  Pharmacy listed  Send Medication Request to:                                                 [] Pharmacy not listed (added to pharmacy list in Epic) Send Medication Request to:      Listed Pharmacies: [id number] - [place], [place] - [address] - PHONE: [phone] - FAX: [phone] [id number] - [place], [place] - [address] - PHONE: [phone] - FAX: [phone]

## 2024-09-23 NOTE — Telephone Encounter (Signed)
 Resent with Diagnoses R06.02

## 2024-09-25 ENCOUNTER — Encounter: Payer: Self-pay | Admitting: Cardiology

## 2024-09-25 ENCOUNTER — Other Ambulatory Visit: Payer: Self-pay | Admitting: Cardiology

## 2024-09-25 ENCOUNTER — Ambulatory Visit: Admitting: Cardiology

## 2024-09-25 VITALS — BP 130/70 | HR 80 | Ht 72.0 in | Wt 185.0 lb

## 2024-09-25 DIAGNOSIS — I2510 Atherosclerotic heart disease of native coronary artery without angina pectoris: Secondary | ICD-10-CM

## 2024-09-25 DIAGNOSIS — I5032 Chronic diastolic (congestive) heart failure: Secondary | ICD-10-CM

## 2024-09-25 DIAGNOSIS — E785 Hyperlipidemia, unspecified: Secondary | ICD-10-CM

## 2024-09-25 DIAGNOSIS — I350 Nonrheumatic aortic (valve) stenosis: Secondary | ICD-10-CM

## 2024-09-25 NOTE — Patient Instructions (Addendum)

## 2024-10-03 ENCOUNTER — Other Ambulatory Visit: Payer: Self-pay | Admitting: Gastroenterology

## 2024-10-07 ENCOUNTER — Telehealth: Payer: Self-pay | Admitting: Cardiology

## 2024-10-07 MED ORDER — ISOSORBIDE MONONITRATE ER 30 MG PO TB24: 15.0000 mg | ORAL_TABLET | Freq: Every day | ORAL | 3 refills | Status: AC

## 2024-10-07 NOTE — Telephone Encounter (Signed)
*  STAT* If patient is at the pharmacy, call can be transferred to refill team.   1. Which medications need to be refilled? (please list name of each medication and dose if known)   isosorbide  mononitrate (IMDUR ) 30 MG 24 hr tablet    2. Which pharmacy/location (including street and city if local pharmacy) is medication to be sent to?  [facility] - [place], [place] - [address]      3. Do they need a 30 day or 90 day supply? 90 day

## 2024-10-07 NOTE — Telephone Encounter (Signed)
 RX sent in

## 2024-12-25 ENCOUNTER — Telehealth: Payer: Self-pay | Admitting: Radiation Oncology

## 2024-12-25 NOTE — Telephone Encounter (Signed)
 Spoke to pt's son regarding moving telephone f/u with PA Lanell. It was noted CT moved to 2/10, f/u appt moved to next available appt slot, 3/2@3pm .

## 2024-12-26 NOTE — Progress Notes (Addendum)
 [facility] [place]  Return Patient Visit Gregory [name] DOB: 1934-05-07  MRN: [id number] Visit Date: 12/26/2024  Encounter Provider: Gustav Almarie Pack, FNP  Subjective History of Present Illness The patient presents for evaluation of bilateral knee pain, tremors, and genital redness.  He reports experiencing a throbbing sensation in both knees, which began approximately 2 weeks ago. This discomfort is particularly noticeable when he is at rest, such as when lying in bed. He has no prior history of knee issues.The throbbing sensation is localized to his calves and knees. He has been managing the discomfort with ibuprofen, which he finds helpful. Denies any swelling in his knees.   He had a fall several months ago, resulting in a face-down impact on a hardwood floor. Following this incident, he underwent rehabilitation and has been residing at home for the past 3 months. He lives with his wife and son and is currently relearning to walk, a process that involves frequent physical therapy sessions. However, these sessions were discontinued and they need a new referral placed. His last physical therapy session was 10 days ago, and he has been performing exercises twice daily to regain strength and mobility.   He has been experiencing an increase in tremors for the past few weeks, which has significantly impacted his ability to feed and care for himself. This symptom had previously subsided but has recently recurred. The shaking is constant and affects his entire body, including his knees. He has not consulted a neurologist for this issue.  He reports redness in his genital area. This has been present for a couple of weeks. He was prescribed clotrimazole for this 1 week ago and has been using the cream twice daily as recommended. No new changes.   Review of Systems  Constitutional:  Positive for activity change and fatigue.  HENT: Negative.    Respiratory: Negative.     Cardiovascular: Negative.   Gastrointestinal: Negative.   Musculoskeletal:  Positive for gait problem.  Skin:  Positive for color change.  Neurological:  Positive for tremors and weakness.    Behavioral Health Screening  Patient Health Questionnaire-2 Score: 0 (12/26/2024  2:04 PM)      Patient's Depression screening is Negative   Depression Plan: Normal/Negative Screening  Objective Blood pressure 130/72, pulse 77, height 1.829 m (6'), weight 83 kg (183 lb), SpO2 96%. Physical Exam   Physical Exam Cardiovascular:     Rate and Rhythm: Normal rate and regular rhythm.     Pulses: Normal pulses.     Heart sounds: Normal heart sounds.  Pulmonary:     Effort: Pulmonary effort is normal. No respiratory distress.     Breath sounds: Normal breath sounds. No wheezing.  Musculoskeletal:     Right knee: Normal. No swelling, deformity, erythema, bony tenderness or crepitus. Normal range of motion. No tenderness.     Left knee: Normal. No swelling, deformity, erythema, bony tenderness or crepitus. Normal range of motion. No tenderness.  Neurological:     Mental Status: He is alert and oriented to person, place, and time.     Motor: Weakness and tremor present.     Comments: Mild essential tremor, upper extremities    Psychiatric:        Behavior: Behavior normal.     Medical History: Medical History[1]  Patient Active Problem List   Diagnosis Date Noted   Acquired hypothyroidism 09/26/2024   Anemia of chronic disease 09/26/2024   Nonrheumatic aortic valve stenosis 09/22/2024   Hepatic steatosis  09/07/2024   Hyperammonemia (CMD) 09/07/2024   Phimosis of penis 09/07/2024   Pressure injury of ankle, stage 2 (CMD) 09/07/2024   Reactive airways dysfunction syndrome    (CMD) 09/07/2024   Type 2 diabetes mellitus with hyperglycemia    (CMD) 09/07/2024   Elevated liver enzymes 03/14/2024   History of MI (myocardial infarction) 01/11/2023   Lung nodule 12/30/2022    Carotid artery disease 12/30/2022   CAD (coronary artery disease) 12/30/2022   AKI (acute kidney injury) 12/25/2022   Stage 3a chronic kidney disease (CMD) 01/24/2022   Elevated glucose 01/18/2022   Numbness and tingling in left hand 09/12/2019   Benign localized hyperplasia of prostate with urinary obstruction 03/26/2018   Urine frequency 03/16/2017   Carotid atherosclerosis 03/10/2016   Depression 03/10/2016   Hearing loss 03/10/2016   Pure hypercholesterolemia 03/10/2016   Essential hypertension 03/10/2016   Impaired fasting glucose 03/10/2016   PVD (peripheral vascular disease) 03/10/2016   Situational mixed anxiety and depressive disorder 03/10/2016   Chronic insomnia 03/10/2016   Abdominal aortic aneurysm (AAA) without rupture 03/02/2016   History of stroke 03/02/2016   Current Medications:  Medications Ordered Prior to Encounter[2] Current Medications[3]  Allergies: Allergies[4]   Immunizations:  Immunization History  Administered Date(s) Administered   Influenza, High-dose Seasonal, Quadrivalent, Preservative Free 11/20/2019   Influenza, high-dose, trivalent, PF 09/06/2016, 10/31/2018   Moderna SARS-CoV-2 Primary Series 12+ yrs 02/24/2020, 03/31/2020   Pneumococcal Conjugate 13-Valent 04/08/2015, 10/31/2018   Pneumococcal Polysaccharide Vaccine, 23 Valent (PNEUMOVAX-23) 2Y+ 02/17/2010   Varicella Zoster [facility]) 18Y+ 04/14/2018, 06/20/2018    Surgical History- Surgical History[5]  Family history- Family History[6] Social history- Social History[7]   Labs: No results found for this or any previous visit (from the past week).    Assessment & Plan 1. Essential tremor (Primary) - Ambulatory referral to Neurology; Future - Ambulatory referral to Home Health; Future  - Tremors have worsened over the past couple of weeks, interfering with daily activities such as eating. - The tremors were previously managed when he was on a beta blocker  but this had to be discontinued due to low heart rate. - A referral to neurology will be made for further evaluation and management of the tremors. - A referral for occupational therapy will be made to assist with coordination and daily activities affected by the tremors.  2. Balanitis  Continue using prescribed Clotrimazole twice daily for 3-4 weeks   3. Bilateral leg pain - CBC with Differential - Comprehensive Metabolic Panel - The bilateral knee pain started 2 weeks ago and is described as a throbbing sensation, primarily at night.  - No swelling or tenderness is noted, and the pain is not present during the day. No acute injury. He does have restless leg and tremors have recently returned, discussed how this could be related.  - Ibuprofen is being used for pain management, blood work will be ordered to monitor kidney function due to ibuprofen use. - A referral for physical therapy will be made to address strength and balance issues.  4. Generalized weakness - Ambulatory referral to Home Health; Future   PT and OT were previously coming to his home. They have not been in 10 days. He is having to use a walker at all times. He also requires an additional person when going from a sitting to standing position. Patient and patients family feel like he would benefit from more sessions. Referral has been placed.      Problem List Items Addressed This  Visit   None Visit Diagnoses       Essential tremor    -  Primary   Relevant Orders   Ambulatory referral to Neurology   Ambulatory referral to Home Health     Balanitis         Bilateral leg pain       Relevant Orders   CBC with Differential   Comprehensive Metabolic Panel     Generalized weakness       Relevant Orders   Ambulatory referral to Home Health      Please contact my office for worsening conditions or problems, and seek emergency medical treatment and/or call 911 if you or your family deems either necessary.  There  are no Patient Instructions on file for this visit.  No follow-ups on file.  Portions of this note were created using DAX Copilot software. Although proofread, errors may be present.  I have personally spent 40 minutes involved in face-to-face and non-face-to-face activities for this patient on the day of the visit.  Professional time spent includes the following activities, in addition to those noted in the documentation:  - preparing to see the patient (e.g., review of recent and/or remote lab/imaging/study results, provider notes, and patient messages/phone calls available in current EMR, CareEverywhere, and scanned records) -obtaining and/or reviewing separately obtained history either through past provider notes, patient phone calls, and/or patient's family member(s)/caregiver(s) -performing a medically appropriate examination and/or evaluation -counseling and educating the patient/family/caregiver -ordering medications, tests, or procedures -documenting clinical information in the electronic or other health record -reviewing most up to date studies or expert consensus guidelines for screening/diagnosing/treating pertinent conditions/symptoms -independently interpreting results (not separately reported) and communicating results to the patient/family/caregiver -care coordination (not separately reported) -referring and communicating with other health care professionals (when not separately reported)  Gustav Almarie Pack, FNP 2:54 PM       [1] Past Medical History: Diagnosis Date   Acute encephalopathy 09/06/2024   Acute ischemic stroke    (CMD)    resolved 08/07/15   Anxiety    Aphasia due to stroke 03/10/2016   Aphasia due to stroke 03/10/2016   Cataract    rt eye   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 01/02/2018   Depression    Diverticulitis of colon    Dyspnea 09/07/2024   Hypercholesterolemia    Hypertension    Premature atrial  beats 03/10/2016  [2] Current Outpatient Medications on File Prior to Visit  Medication Sig Dispense Refill   albuterol  2.5 mg /3 mL (0.083 %) nebulizer solution Take 3 mL (2.5 mg total) by nebulization every 4 (four) hours as needed for wheezing. 180 mL 3   albuterol  HFA (PROVENTIL  HFA;VENTOLIN  HFA;PROAIR  HFA) 90 mcg/actuation inhaler Inhale 2 puffs every 6 (six) hours as needed for wheezing. 1 each 5   ALPRAZolam  (XANAX ) 0.5 mg tablet TAKE ONE TABLET DURING THE DAY AND TWO TABLETS AT BEDTIME AS NEEDED FOR ANXIETY AND INSOMNIA 90 tablet 2   amiodarone  (PACERONE ) 200 mg tablet Take 200 mg by mouth daily.     amLODIPine  (NORVASC ) 5 mg tablet TAKE 1 TABLET BY MOUTH DAILY FOR BLOOD PRESSURE CONTROL 90 tablet 3   aspirin  81 mg EC tablet Take 81 mg by mouth Once Daily.     cholecalciferol (Vitamin D3) 2,000 unit tablet Take 1 each (2,000 Units total) by mouth daily. 90 each 3   clotrimazole (LOTRIMIN) 1 % cream Apply topically 2 (two) times a day. 45 g 0  ergocalciferol, vitamin D2, (VITAMIN D2 ORAL) Take by mouth.     glucose blood test strip Use as directed. 100 each 0   glucose monitoring kit kit Use to check glucose daily 1 each 0   isosorbide  mononitrate (IMDUR ) 30 mg 24 hr tablet Take 15 mg by mouth.     lactulose  (CHRONULAC ) 10 gram/15 mL solution GIVE 30 ML BY MOUTH TWO TIMES A DAY FOR HYPERAMMONEMIA     Lancets misc Use as directed. 100 each 3   levothyroxine  (SYNTHROID ) 25 mcg tablet TAKE ONE TABLET ( TOTAL) BY MOUTH IN THE MORNING 30 tablet 0   mirtazapine  (REMERON ) 7.5 mg tablet Taking nightly.  Prescribed by psychiatrist. 30 tablet 0   miscellaneous medical supply misc Spacer 1 each 0   omega 3-dha-epa-fish oil (OMEGA 3) 1,000 mg capsule Take 1 g by mouth Once Daily.     omeprazole  (PriLOSEC) 20 mg DR capsule Take 20 mg by mouth every other day.     sertraline  (ZOLOFT ) 50 mg tablet 3 a day prescribed by psychiatrist.     tamsulosin  (FLOMAX ) 0.4 mg cap Take 1  capsule daily, at bedtime, to improve bladder function 90 capsule 3   No current facility-administered medications on file prior to visit.  [3]  Current Outpatient Medications:    albuterol  2.5 mg /3 mL (0.083 %) nebulizer solution, Take 3 mL (2.5 mg total) by nebulization every 4 (four) hours as needed for wheezing., Disp: 180 mL, Rfl: 3   albuterol  HFA (PROVENTIL  HFA;VENTOLIN  HFA;PROAIR  HFA) 90 mcg/actuation inhaler, Inhale 2 puffs every 6 (six) hours as needed for wheezing., Disp: 1 each, Rfl: 5   ALPRAZolam  (XANAX ) 0.5 mg tablet, TAKE ONE TABLET DURING THE DAY AND TWO TABLETS AT BEDTIME AS NEEDED FOR ANXIETY AND INSOMNIA, Disp: 90 tablet, Rfl: 2   amiodarone  (PACERONE ) 200 mg tablet, Take 200 mg by mouth daily., Disp: , Rfl:    amLODIPine  (NORVASC ) 5 mg tablet, TAKE 1 TABLET BY MOUTH DAILY FOR BLOOD PRESSURE CONTROL, Disp: 90 tablet, Rfl: 3   aspirin  81 mg EC tablet, Take 81 mg by mouth Once Daily., Disp: , Rfl:    cholecalciferol (Vitamin D3) 2,000 unit tablet, Take 1 each (2,000 Units total) by mouth daily., Disp: 90 each, Rfl: 3   clotrimazole (LOTRIMIN) 1 % cream, Apply topically 2 (two) times a day., Disp: 45 g, Rfl: 0   ergocalciferol, vitamin D2, (VITAMIN D2 ORAL), Take by mouth., Disp: , Rfl:    glucose blood test strip, Use as directed., Disp: 100 each, Rfl: 0   glucose monitoring kit kit, Use to check glucose daily, Disp: 1 each, Rfl: 0   isosorbide  mononitrate (IMDUR ) 30 mg 24 hr tablet, Take 15 mg by mouth., Disp: , Rfl:    lactulose  (CHRONULAC ) 10 gram/15 mL solution, GIVE 30 ML BY MOUTH TWO TIMES A DAY FOR HYPERAMMONEMIA, Disp: , Rfl:    Lancets misc, Use as directed., Disp: 100 each, Rfl: 3   levothyroxine  (SYNTHROID ) 25 mcg tablet, TAKE ONE TABLET ( TOTAL) BY MOUTH IN THE MORNING, Disp: 30 tablet, Rfl: 0   mirtazapine  (REMERON ) 7.5 mg tablet, Taking nightly.  Prescribed by psychiatrist., Disp: 30 tablet, Rfl: 0   miscellaneous medical supply misc,  Spacer, Disp: 1 each, Rfl: 0   omega 3-dha-epa-fish oil (OMEGA 3) 1,000 mg capsule, Take 1 g by mouth Once Daily., Disp: , Rfl:    omeprazole  (PriLOSEC) 20 mg DR capsule, Take 20 mg by mouth every other day., Disp: , Rfl:  sertraline  (ZOLOFT ) 50 mg tablet, 3 a day prescribed by psychiatrist., Disp: , Rfl:    tamsulosin  (FLOMAX ) 0.4 mg cap, Take 1 capsule daily, at bedtime, to improve bladder function, Disp: 90 capsule, Rfl: 3 [4] Allergies Allergen Reactions   Bupropion Hcl Other (See Comments)    un   Divalproex Other (See Comments)    un  [5] Past Surgical History: Procedure Laterality Date   BRONCHOSCOPY  04/27/2023   CARDIAC CATHETERIZATION  05/16/2023   COLECTOMY PARTIAL / TOTAL  2012   removal of benign diverticular mass.  End to end re-anastamosis.   COLONOSCOPY  02/23/2011   Procedure: COLONOSCOPY; end to end anatomomsis diverticulae Dr.John Dyane repeat in 23yrs   CORONARY ARTERY BYPASS GRAFT N/A 05/22/2023   2 vessel bypass.  [facility].   OTHER SURGICAL HISTORY     Procedure: OTHER SURGICAL HISTORY (carotid thromboendarterectomy)  [6] Family History Problem Relation Name Age of Onset   Heart disease Mother     Stroke Mother     Stroke Sister     Dementia Sister     Heart disease Brother     Cerebral aneurysm Brother     Stroke Brother    [7] Social History Socioeconomic History   Marital status: Married  Tobacco Use   Smoking status: Former   Smokeless tobacco: Current  Substance and Sexual Activity   Alcohol use: No   Drug use: Not Currently   Social Drivers of Health   Living Situation: Low Risk (12/26/2024)   Living Situation    What is your living situation today?: I have a steady place to live    Think about the place you live. Do you have problems with any of the following? Choose all that apply:: None/None on this list  Food Insecurity: Low Risk (12/26/2024)   Food vital sign    Within the past 12 months, you worried  that your food would run out before you got money to buy more: Never true    Within the past 12 months, the food you bought just didn't last and you didn't have money to get more: Never true  Transportation Needs: No Transportation Needs (12/26/2024)   Transportation    In the past 12 months, has lack of reliable transportation kept you from medical appointments, meetings, work or from getting things needed for daily living? : No  Utilities: Low Risk (12/26/2024)   Utilities    In the past 12 months has the electric, gas, oil, or water  company threatened to shut off services in your home? : No  Safety: Low Risk (12/26/2024)   Safety    How often does anyone, including family and friends, physically hurt you?: Never    How often does anyone, including family and friends, insult or talk down to you?: Never    How often does anyone, including family and friends, threaten you with harm?: Never    How often does anyone, including family and friends, scream or curse at you?: Never  Alcohol Screening: Not At Risk (07/04/2023)   Alcohol    Audit C Alcohol risk score: 0  Tobacco Use: High Risk (12/26/2024)   Patient History    Smoking Tobacco Use: Former    Smokeless Tobacco Use: Current  Depression: Not At Risk (12/26/2024)   PHQ-2    PHQ-2 Score: 0  Social Connections: Moderately Integrated (09/08/2024)   Received from [facility]   Social Connection and Isolation Panel    In a typical week, how  many times do you talk on the phone with family, friends, or neighbors?: Three times a week    How often do you get together with friends or relatives?: Twice a week    How often do you attend church or religious services?: More than 4 times per year    Do you belong to any clubs or organizations such as church groups, unions, fraternal or athletic groups, or school groups?: No    How often do you attend meetings of the clubs or organizations you belong to?: Never    Are you married, widowed,  divorced, separated, never married, or living with a partner?: Married

## 2024-12-30 ENCOUNTER — Emergency Department (HOSPITAL_BASED_OUTPATIENT_CLINIC_OR_DEPARTMENT_OTHER): Admission: EM | Admit: 2024-12-30 | Discharge: 2024-12-31 | Disposition: A

## 2024-12-30 ENCOUNTER — Other Ambulatory Visit: Payer: Self-pay

## 2024-12-30 ENCOUNTER — Emergency Department (HOSPITAL_COMMUNITY)

## 2024-12-30 ENCOUNTER — Emergency Department (HOSPITAL_BASED_OUTPATIENT_CLINIC_OR_DEPARTMENT_OTHER)

## 2024-12-30 ENCOUNTER — Encounter (HOSPITAL_BASED_OUTPATIENT_CLINIC_OR_DEPARTMENT_OTHER): Payer: Self-pay

## 2024-12-30 DIAGNOSIS — N189 Chronic kidney disease, unspecified: Secondary | ICD-10-CM | POA: Insufficient documentation

## 2024-12-30 DIAGNOSIS — I129 Hypertensive chronic kidney disease with stage 1 through stage 4 chronic kidney disease, or unspecified chronic kidney disease: Secondary | ICD-10-CM | POA: Diagnosis not present

## 2024-12-30 DIAGNOSIS — R531 Weakness: Secondary | ICD-10-CM | POA: Diagnosis not present

## 2024-12-30 DIAGNOSIS — E1122 Type 2 diabetes mellitus with diabetic chronic kidney disease: Secondary | ICD-10-CM | POA: Diagnosis not present

## 2024-12-30 DIAGNOSIS — R059 Cough, unspecified: Secondary | ICD-10-CM | POA: Insufficient documentation

## 2024-12-30 DIAGNOSIS — I2510 Atherosclerotic heart disease of native coronary artery without angina pectoris: Secondary | ICD-10-CM | POA: Insufficient documentation

## 2024-12-30 DIAGNOSIS — Z7902 Long term (current) use of antithrombotics/antiplatelets: Secondary | ICD-10-CM | POA: Diagnosis not present

## 2024-12-30 DIAGNOSIS — R4781 Slurred speech: Secondary | ICD-10-CM | POA: Insufficient documentation

## 2024-12-30 DIAGNOSIS — Z79899 Other long term (current) drug therapy: Secondary | ICD-10-CM | POA: Diagnosis not present

## 2024-12-30 LAB — CBC
HCT: 36.4 % — ABNORMAL LOW (ref 39.0–52.0)
Hemoglobin: 12 g/dL — ABNORMAL LOW (ref 13.0–17.0)
MCH: 30.6 pg (ref 26.0–34.0)
MCHC: 33 g/dL (ref 30.0–36.0)
MCV: 92.9 fL (ref 80.0–100.0)
Platelets: 155 K/uL (ref 150–400)
RBC: 3.92 MIL/uL — ABNORMAL LOW (ref 4.22–5.81)
RDW: 14.8 % (ref 11.5–15.5)
WBC: 5 K/uL (ref 4.0–10.5)
nRBC: 0 % (ref 0.0–0.2)

## 2024-12-30 LAB — COMPREHENSIVE METABOLIC PANEL WITH GFR
ALT: 35 U/L (ref 0–44)
AST: 85 U/L — ABNORMAL HIGH (ref 15–41)
Albumin: 3.4 g/dL — ABNORMAL LOW (ref 3.5–5.0)
Alkaline Phosphatase: 109 U/L (ref 38–126)
Anion gap: 9 (ref 5–15)
BUN: 17 mg/dL (ref 8–23)
CO2: 26 mmol/L (ref 22–32)
Calcium: 9.5 mg/dL (ref 8.9–10.3)
Chloride: 103 mmol/L (ref 98–111)
Creatinine, Ser: 1.3 mg/dL — ABNORMAL HIGH (ref 0.61–1.24)
GFR, Estimated: 53 mL/min — ABNORMAL LOW
Glucose, Bld: 106 mg/dL — ABNORMAL HIGH (ref 70–99)
Potassium: 4.2 mmol/L (ref 3.5–5.1)
Sodium: 138 mmol/L (ref 135–145)
Total Bilirubin: 0.8 mg/dL (ref 0.0–1.2)
Total Protein: 7.3 g/dL (ref 6.5–8.1)

## 2024-12-30 LAB — URINALYSIS, ROUTINE W REFLEX MICROSCOPIC
Bacteria, UA: NONE SEEN
Bilirubin Urine: NEGATIVE
Glucose, UA: NEGATIVE mg/dL
Ketones, ur: NEGATIVE mg/dL
Nitrite: NEGATIVE
Protein, ur: NEGATIVE mg/dL
Specific Gravity, Urine: 1.018 (ref 1.005–1.030)
pH: 6.5 (ref 5.0–8.0)

## 2024-12-30 LAB — RESP PANEL BY RT-PCR (RSV, FLU A&B, COVID)  RVPGX2
Influenza A by PCR: NEGATIVE
Influenza B by PCR: NEGATIVE
Resp Syncytial Virus by PCR: NEGATIVE
SARS Coronavirus 2 by RT PCR: NEGATIVE

## 2024-12-30 LAB — CBG MONITORING, ED: Glucose-Capillary: 102 mg/dL — ABNORMAL HIGH (ref 70–99)

## 2024-12-30 NOTE — ED Provider Notes (Signed)
 " Patient transferred here from [facility] for MRI due to some increased gait instability where he seems to be leaning little bit to the right and some worsening speech.  He does have issues with these at baseline from prior stroke but son has noticed it more pronounced over the past week.  MRI here is negative.  Son at bedside, handles most of his medical decisions.  PCP is working on getting home OT and physical therapy set up again, son feels like they need more around-the-clock help as he is unable to continue lifting patient himself.  I discussed options of in-home help versus rehab placement, he feels that patient would prefer to remain at home.  I have placed a face-to-face evaluation to get home health set up.  Will also likely need outpatient follow-up with neurology (he is established with Dr. Evonnie).  Strict return precautions given for any new acute/changes.   Results for orders placed or performed during the hospital encounter of 12/30/24  Comprehensive metabolic panel   Collection Time: 12/30/24  1:09 PM  Result Value Ref Range   Sodium 138 135 - 145 mmol/L   Potassium 4.2 3.5 - 5.1 mmol/L   Chloride 103 98 - 111 mmol/L   CO2 26 22 - 32 mmol/L   Glucose, Bld 106 (H) 70 - 99 mg/dL   BUN 17 8 - 23 mg/dL   Creatinine, Ser 8.69 (H) 0.61 - 1.24 mg/dL   Calcium  9.5 8.9 - 10.3 mg/dL   Total Protein 7.3 6.5 - 8.1 g/dL   Albumin  3.4 (L) 3.5 - 5.0 g/dL   AST 85 (H) 15 - 41 U/L   ALT 35 0 - 44 U/L   Alkaline Phosphatase 109 38 - 126 U/L   Total Bilirubin 0.8 0.0 - 1.2 mg/dL   GFR, Estimated 53 (L) >60 mL/min   Anion gap 9 5 - 15  CBC   Collection Time: 12/30/24  1:09 PM  Result Value Ref Range   WBC 5.0 4.0 - 10.5 K/uL   RBC 3.92 (L) 4.22 - 5.81 MIL/uL   Hemoglobin 12.0 (L) 13.0 - 17.0 g/dL   HCT 63.5 (L) 60.9 - 47.9 %   MCV 92.9 80.0 - 100.0 fL   MCH 30.6 26.0 - 34.0 pg   MCHC 33.0 30.0 - 36.0 g/dL   RDW 85.1 88.4 - 84.4 %   Platelets 155 150 - 400 K/uL   nRBC 0.0 0.0 - 0.2 %   Resp panel by RT-PCR (RSV, Flu A&B, Covid) Anterior Nasal Swab   Collection Time: 12/30/24  1:23 PM   Specimen: Anterior Nasal Swab  Result Value Ref Range   SARS Coronavirus 2 by RT PCR NEGATIVE NEGATIVE   Influenza A by PCR NEGATIVE NEGATIVE   Influenza B by PCR NEGATIVE NEGATIVE   Resp Syncytial Virus by PCR NEGATIVE NEGATIVE  CBG monitoring, ED   Collection Time: 12/30/24  1:57 PM  Result Value Ref Range   Glucose-Capillary 102 (H) 70 - 99 mg/dL  Urinalysis, Routine w reflex microscopic -Urine, Clean Catch   Collection Time: 12/30/24  3:32 PM  Result Value Ref Range   Color, Urine YELLOW YELLOW   APPearance CLEAR CLEAR   Specific Gravity, Urine 1.018 1.005 - 1.030   pH 6.5 5.0 - 8.0   Glucose, UA NEGATIVE NEGATIVE mg/dL   Hgb urine dipstick TRACE (A) NEGATIVE   Bilirubin Urine NEGATIVE NEGATIVE   Ketones, ur NEGATIVE NEGATIVE mg/dL   Protein, ur NEGATIVE NEGATIVE mg/dL  Nitrite NEGATIVE NEGATIVE   Leukocytes,Ua SMALL (A) NEGATIVE   RBC / HPF 0-5 0 - 5 RBC/hpf   WBC, UA 0-5 0 - 5 WBC/hpf   Bacteria, UA NONE SEEN NONE SEEN   Squamous Epithelial / HPF 0-5 0 - 5 /HPF   MR BRAIN WO CONTRAST Result Date: 12/30/2024 CLINICAL DATA:  Initial evaluation for acute neuro deficit, stroke. EXAM: MRI HEAD WITHOUT CONTRAST TECHNIQUE: Multiplanar, multiecho pulse sequences of the brain and surrounding structures were obtained without intravenous contrast. COMPARISON:  CT from earlier the same day. FINDINGS: Brain: Generalized age-related cerebral atrophy with mild chronic microvascular ischemic disease. Few remote right MCA distribution infarcts involving the right frontal and parietal lobes with mild chronic hemosiderin staining. Additional probable chronic infarct involving the left temporoccipital region present as well. Additional chronic blood products within this region as well. No evidence for acute or subacute infarct. No acute intracranial hemorrhage. Few small chronic micro  hemorrhages noted, likely hypertensive in nature. No mass lesion or midline shift. No hydrocephalus. No extra-axial fluid collection. Pituitary gland within normal limits. Vascular: Major intracranial vascular flow voids are maintained. Skull and upper cervical spine: Cranial junction with normal limits. Degenerative thickening noted about the tectorial membrane. Bone marrow signal intensity within normal limits. No scalp soft tissue abnormality. Sinuses/Orbits: Prior bilateral ocular lens replacement. Paranasal sinuses are largely clear. No significant mastoid effusion. Other: None. IMPRESSION: 1. No acute intracranial abnormality. 2. Age-related cerebral atrophy with mild chronic microvascular ischemic disease, with multiple chronic ischemic infarcts as above. Electronically Signed   By: Morene Hoard M.D.   On: 12/30/2024 20:33   CT Head Wo Contrast Result Date: 12/30/2024 EXAM: CT HEAD WITHOUT CONTRAST 12/30/2024 01:32:43 PM TECHNIQUE: CT of the head was performed without the administration of intravenous contrast. Automated exposure control, iterative reconstruction, and/or weight based adjustment of the mA/kV was utilized to reduce the radiation dose to as low as reasonably achievable. COMPARISON: CT head 09/06/2024 CLINICAL HISTORY: Mental status change, unknown cause FINDINGS: BRAIN AND VENTRICLES: No acute hemorrhage. No evidence of acute infarct. Remote right frontal and left parietal/occipital infarcts. No hydrocephalus. No extra-axial collection. No mass effect or midline shift. ORBITS: No acute abnormality. SINUSES: No acute abnormality. SOFT TISSUES AND SKULL: No acute soft tissue abnormality. No skull fracture. IMPRESSION: 1. No acute intracranial abnormality. 2. Remote right frontal and left parietal/occipital infarcts. Electronically signed by: Gilmore Molt MD MD 12/30/2024 03:12 PM EST RP Workstation: [id number]    "

## 2024-12-30 NOTE — Discharge Instructions (Addendum)
 MRI today did not show any acute findings. I have placed order for home health evaluation-- they should contact you about this in the next 24-48 hours. He is established with Dr. Evonnie at [facility] neurology, would benefit from follow-up with her as well as primary care doctor. Please return here for any new/acute changes.

## 2024-12-30 NOTE — ED Triage Notes (Signed)
 Per patient son maylene the past week he has had progressive issues with bearing weight, tremors have increased, and has had some slurred speech. Pt speech in triage is clear. Hx of TIA.

## 2024-12-30 NOTE — ED Provider Notes (Signed)
 " [place] EMERGENCY DEPARTMENT AT [facility] Provider Note   CSN: [id number] Arrival date & time: 12/30/24  1231     Patient presents with: Weakness   Gregory [name] is a 89 y.o. male with PMHx CAD, HLD, HTN, CVA, CKD, DM who presents to ED concerned for possible right sided weakness and slurred speech developing over the past 1 week. Patient with ambulatory difficulties at baseline and receives heavy assistance from family members at home. Caregiver at bedside stating that patient seems to be leaning towards the right more often and has been having slurred speech and sometimes the caregiver cannot understand what patient is trying to tell him.   Patient with intermittent and mild cough recently, but caregiver stating that this is nothing new for patient.   Patient denies fever, chest pain, dyspnea, nausea, vomiting, diarrhea, dysuria, hematuria. Denies head trauma, LOC, seizure, blood thinner. Denies recent fall.     Weakness      Prior to Admission medications  Medication Sig Start Date End Date Taking? Authorizing Provider  albuterol  (VENTOLIN  HFA) 108 (90 Base) MCG/ACT inhaler Inhale 2 puffs into the lungs every 6 (six) hours as needed for wheezing. 11/19/24 05/18/25 Yes [provider]  clotrimazole (LOTRIMIN) 1 % cream Apply 1 Application topically 2 (two) times daily. 12/16/24 01/15/25 Yes [provider]  Spacer/Aero-Holding [facility] DIAMOND) MISC 1 each by Other route See admin instructions. use with inhaler 11/19/24  Yes [provider]  albuterol  (PROVENTIL ) (2.5 MG/3ML) 0.083% nebulizer solution Take 3 mLs (2.5 mg total) by nebulization every 4 (four) hours as needed for wheezing or shortness of breath. 09/11/24   Barbarann Nest, MD  ALPRAZolam  (XANAX ) 0.5 MG tablet Take 2 tablets (1 mg total) by mouth at bedtime. 09/11/24   Barbarann Nest, MD  amiodarone  (PACERONE ) 200 MG tablet Take 1 tablet (200 mg total) by mouth  daily. Patient taking differently: Take 200 mg by mouth every evening. 07/30/24   Lavona Agent, MD  amLODipine  (NORVASC ) 5 MG tablet Take 5 mg by mouth daily. 07/28/23   [provider]  [Paused] atorvastatin  (LIPITOR ) 80 MG tablet Take 1 tablet (80 mg total) by mouth daily. Patient not taking: Reported on 09/25/2024 Wait to take this until your doctor or other care provider tells you to start again. 12/27/22   Meng, Hao, PA  [Paused] clopidogrel  (PLAVIX ) 75 MG tablet Take 75 mg by mouth daily. Patient not taking: Reported on 09/25/2024 Wait to take this until your doctor or other care provider tells you to start again. 05/31/23   [provider]  ferrous sulfate  325 (65 FE) MG EC tablet Take 1 tablet (325 mg total) by mouth daily with breakfast. 05/28/23 07/23/25  Dwan Aldo M, PA-C  isosorbide  mononitrate (IMDUR ) 30 MG 24 hr tablet Take 0.5 tablets (15 mg total) by mouth daily. 10/07/24   Lavona Agent, MD  lactulose  (CHRONULAC ) 10 GM/15ML solution Take 30 mLs (20 g total) by mouth 2 (two) times daily. 09/11/24   Barbarann Nest, MD  levothyroxine  (SYNTHROID ) 25 MCG tablet Take 25 mcg by mouth daily before breakfast. 07/05/24 07/05/25  [provider]  mirtazapine  (REMERON ) 7.5 MG tablet TAKE 1 TABLET(7.5 MG) BY MOUTH AT BEDTIME 07/17/24   Cottle, Lorene KANDICE Raddle., MD  Multiple Vitamins-Minerals (CENTRUM ADULTS PO) Take 1 tablet by mouth daily.     [provider]  Multiple Vitamins-Minerals (ZINC PO) Take 1 capsule by mouth daily at 12 noon.    [provider]  nitroGLYCERIN  (NITROSTAT ) 0.4 MG SL tablet Place 0.4 mg under the tongue as needed for chest pain.    [provider]  Omega-3 Fatty Acids (FISH OIL PO) Take 1 capsule by mouth daily.    [provider]  omeprazole  (PRILOSEC) 20 MG capsule TAKE 1 CAPSULE (20 MG TOTAL) BY MOUTH EVERY OTHER DAY. 10/03/24   Legrand Victory LITTIE DOUGLAS, MD  senna-docusate (SENOKOT-S) 8.6-50 MG tablet Take 1  tablet by mouth at bedtime as needed for mild constipation. 07/25/24   Cherlyn Labella, MD  sertraline  (ZOLOFT ) 50 MG tablet TAKE 3 TABLETS(150 MG) BY MOUTH DAILY 07/17/24   Cottle, Lorene KANDICE Raddle., MD  tamsulosin  (FLOMAX ) 0.4 MG CAPS capsule Take 0.4 mg by mouth at bedtime. 10/24/23   [provider]  triamcinolone  (KENALOG ) 0.025 % cream Apply topically 2 (two) times daily. 09/11/24   Barbarann Nest, MD    Allergies: Patient has no known allergies.    Review of Systems  Neurological:  Positive for weakness.    Updated Vital Signs BP (!) 151/74   Pulse 60   Temp 98 F (36.7 C)   Resp 12   SpO2 98%   Physical Exam Vitals and nursing note reviewed.  Constitutional:      General: He is not in acute distress.    Appearance: He is not ill-appearing or toxic-appearing.  HENT:     Head: Normocephalic and atraumatic.     Mouth/Throat:     Mouth: Mucous membranes are moist.  Eyes:     General: No scleral icterus.       Right eye: No discharge.        Left eye: No discharge.     Conjunctiva/sclera: Conjunctivae normal.  Cardiovascular:     Rate and Rhythm: Normal rate and regular rhythm.     Pulses: Normal pulses.     Heart sounds: Normal heart sounds. No murmur heard. Pulmonary:     Effort: Pulmonary effort is normal. No respiratory distress.     Breath sounds: Normal breath sounds. No wheezing, rhonchi or rales.  Abdominal:     General: Abdomen is flat. Bowel sounds are normal. There is no distension.     Palpations: Abdomen is soft. There is no mass.     Tenderness: There is no abdominal tenderness.  Musculoskeletal:     Right lower leg: No edema.     Left lower leg: No edema.  Skin:    General: Skin is warm and dry.     Findings: No rash.  Neurological:     General: No focal deficit present.     Mental Status: He is alert and oriented to person, place, and time. Mental status is at baseline.     Comments: GCS 15. Speech is goal oriented. No deficits appreciated to CN  III-XII; symmetric eyebrow raise, no facial drooping, tongue midline. Patient has equal grip strength bilaterally with 5/5 strength against resistance in all major muscle groups bilaterally. Sensation to light touch intact. Patient moves extremities without ataxia.   Psychiatric:        Mood and Affect: Mood normal.        Behavior: Behavior normal.     (all labs ordered are listed, but only abnormal results are displayed) Labs Reviewed  COMPREHENSIVE METABOLIC PANEL WITH GFR - Abnormal; Notable for the following components:      Result Value   Glucose, Bld 106 (*)    Creatinine, Ser 1.30 (*)    Albumin  3.4 (*)  AST 85 (*)    GFR, Estimated 53 (*)    All other components within normal limits  CBC - Abnormal; Notable for the following components:   RBC 3.92 (*)    Hemoglobin 12.0 (*)    HCT 36.4 (*)    All other components within normal limits  URINALYSIS, ROUTINE W REFLEX MICROSCOPIC - Abnormal; Notable for the following components:   Hgb urine dipstick TRACE (*)    Leukocytes,Ua SMALL (*)    All other components within normal limits  CBG MONITORING, ED - Abnormal; Notable for the following components:   Glucose-Capillary 102 (*)    All other components within normal limits  RESP PANEL BY RT-PCR (RSV, FLU A&B, COVID)  [id number]    EKG: EKG Interpretation Date/Time:  Monday December 30 2024 13:16:03 EST Ventricular Rate:  58 PR Interval:  273 QRS Duration:  104 QT Interval:  508 QTC Calculation: 499 R Axis:   61  Text Interpretation: Sinus rhythm Prolonged PR interval RSR' in V1 or V2, right VCD or RVH Borderline prolonged QT interval Baseline wander in lead(s) I II aVR Compared with prior EKG from 09/06/2024 Confirmed by Gennaro Bouchard ([id number]) on 12/30/2024 1:39:11 PM  Radiology: CT Head Wo Contrast Result Date: 12/30/2024 EXAM: CT HEAD WITHOUT CONTRAST 12/30/2024 01:32:43 PM TECHNIQUE: CT of the head was performed without the administration of intravenous contrast.  Automated exposure control, iterative reconstruction, and/or weight based adjustment of the mA/kV was utilized to reduce the radiation dose to as low as reasonably achievable. COMPARISON: CT head 09/06/2024 CLINICAL HISTORY: Mental status change, unknown cause FINDINGS: BRAIN AND VENTRICLES: No acute hemorrhage. No evidence of acute infarct. Remote right frontal and left parietal/occipital infarcts. No hydrocephalus. No extra-axial collection. No mass effect or midline shift. ORBITS: No acute abnormality. SINUSES: No acute abnormality. SOFT TISSUES AND SKULL: No acute soft tissue abnormality. No skull fracture. IMPRESSION: 1. No acute intracranial abnormality. 2. Remote right frontal and left parietal/occipital infarcts. Electronically signed by: Gilmore Molt MD MD 12/30/2024 03:12 PM EST RP Workstation: [id number]     Procedures   Medications Ordered in the ED - No data to display                                  Medical Decision Making Amount and/or Complexity of Data Reviewed Labs: ordered. Radiology: ordered.   This patient presents to the ED for concern of weakness, this involves an extensive number of treatment options, and is a complaint that carries with it a high risk of complications and morbidity.  The differential diagnosis includes Ischemic stroke, intracerebral hemorrhage, subarachnoid hemorrhage, Guillain-Barr syndrome, hypoglycemia, electrolyte abnormality, sepsis, ACS, carbon monoxide poisoning, anemia, dehydration.   Co morbidities that complicate the patient evaluation   CAD, HLD, HTN, CVA, CKD, DM    Additional history obtained:  Dr. Tanda PCP   Problem List / ED Course / Critical interventions / Medication management  Patient presents to ED concern for slurred speech and possible right-sided weakness over the past 1 week.  Family member stating that patient has ambulatory problems at baseline which was improving with PT/OT and home health assistance.  Patient  has not had  home health recently in 15 days since they recently discharged patient as he was doing better. Caregiver stating that they need to get to the bottom of patient's slurred speech and right sided weakness and also need to talk with SW to get  home health started once again.  I Ordered, and personally interpreted labs.  UA not concerning for infection.  CBC without leukocytosis.  There is mild anemia with hemoglobin at 12.0.  CMP with slight elevation in creatinine at 1.3.  CBG 102.  Respiratory panel negative. The patient was maintained on a cardiac monitor.  I personally viewed and interpreted the EKG/cardiac monitored which showed an underlying rhythm of: sinus rhythm I ordered imaging studies including CT head. I independently visualized and interpreted imaging which showed no acute process. I agree with the radiologist interpretation Shared all results with patient and caregiver at bedside.  Answered all questions.  Caregiver at bedside stating that patient is starting to decondition again over the past week and they need an answer for why he has been leaning to the right and having slurred speech recently.  They also need new social work evaluation to start up home health again at home. Educated patient and caregiver that we could have patient transfer to [facility] for MRI to assess for acute stroke. We can also order PT evaluation and SW consultation to help get home health started at home. Caregiver and patient agreeable with plan with transfer to [facility]. I educated that they might have to wait in waiting room as they await MRI - they are still agreeable with plan. I have already ordered social work evaluation along with PT/OT and MRI brain.  Patient currently stable for POV transportation - caregiver will drive. Staffed with Dr. Ruthe. Dr. Darra accepting provider. I have reviewed the patients home medicines and have made adjustments as needed   Social Determinants of Health:  none       Final diagnoses:  Weakness    ED Discharge Orders     None          Hoy Nidia FALCON, [place] 12/30/24 1718  "

## 2024-12-30 NOTE — ED Notes (Signed)
 Called PT three times for vitals check an no response .SABRASABRASABRA

## 2025-01-06 ENCOUNTER — Ambulatory Visit: Admitting: Radiation Oncology

## 2025-01-28 ENCOUNTER — Other Ambulatory Visit

## 2025-02-17 ENCOUNTER — Ambulatory Visit: Admitting: Radiation Oncology

## 2025-03-05 ENCOUNTER — Ambulatory Visit: Admitting: Cardiology

## 2025-04-14 ENCOUNTER — Ambulatory Visit: Admitting: Psychiatry
